# Patient Record
Sex: Male | Born: 1950 | Race: White | Hispanic: No | Marital: Married | Smoking: Former smoker
Health system: Southern US, Community
[De-identification: ages and names within clinical notes are randomized; demographics above are authoritative.]

## PROBLEM LIST (undated history)

## (undated) DIAGNOSIS — Z87898 Personal history of other specified conditions: Secondary | ICD-10-CM

## (undated) DIAGNOSIS — M1712 Unilateral primary osteoarthritis, left knee: Secondary | ICD-10-CM

## (undated) DIAGNOSIS — G894 Chronic pain syndrome: Secondary | ICD-10-CM

## (undated) DIAGNOSIS — J449 Chronic obstructive pulmonary disease, unspecified: Secondary | ICD-10-CM

## (undated) DIAGNOSIS — F17200 Nicotine dependence, unspecified, uncomplicated: Secondary | ICD-10-CM

## (undated) DIAGNOSIS — R569 Unspecified convulsions: Secondary | ICD-10-CM

## (undated) DIAGNOSIS — F411 Generalized anxiety disorder: Secondary | ICD-10-CM

## (undated) DIAGNOSIS — M5136 Other intervertebral disc degeneration, lumbar region: Secondary | ICD-10-CM

## (undated) DIAGNOSIS — K219 Gastro-esophageal reflux disease without esophagitis: Secondary | ICD-10-CM

## (undated) DIAGNOSIS — J439 Emphysema, unspecified: Secondary | ICD-10-CM

## (undated) DIAGNOSIS — G40909 Epilepsy, unspecified, not intractable, without status epilepticus: Secondary | ICD-10-CM

## (undated) DIAGNOSIS — I10 Essential (primary) hypertension: Secondary | ICD-10-CM

## (undated) DIAGNOSIS — Z8701 Personal history of pneumonia (recurrent): Secondary | ICD-10-CM

## (undated) HISTORY — PX: KNEE SURGERY: SHX244

## (undated) HISTORY — DX: Unilateral primary osteoarthritis, left knee: M17.12

## (undated) HISTORY — DX: Chronic obstructive pulmonary disease, unspecified: J44.9

## (undated) HISTORY — DX: Epilepsy, unspecified, not intractable, without status epilepticus: G40.909

## (undated) HISTORY — DX: Nicotine dependence, unspecified, uncomplicated: F17.200

## (undated) HISTORY — DX: Gastro-esophageal reflux disease without esophagitis: K21.9

## (undated) HISTORY — DX: Generalized anxiety disorder: F41.1

## (undated) HISTORY — DX: Essential (primary) hypertension: I10

## (undated) HISTORY — DX: Personal history of other specified conditions: Z87.898

## (undated) HISTORY — DX: Chronic pain syndrome: G89.4

## (undated) HISTORY — DX: Other intervertebral disc degeneration, lumbar region: M51.36

## (undated) HISTORY — DX: Personal history of pneumonia (recurrent): Z87.01

---

## 2004-06-14 ENCOUNTER — Emergency Department (HOSPITAL_COMMUNITY): Admission: EM | Admit: 2004-06-14 | Discharge: 2004-06-14 | Payer: Self-pay | Admitting: Emergency Medicine

## 2004-07-25 ENCOUNTER — Ambulatory Visit (HOSPITAL_COMMUNITY): Admission: RE | Admit: 2004-07-25 | Discharge: 2004-07-25 | Payer: Self-pay | Admitting: Neurology

## 2006-04-25 ENCOUNTER — Ambulatory Visit (HOSPITAL_COMMUNITY): Admission: RE | Admit: 2006-04-25 | Discharge: 2006-04-25 | Payer: Self-pay | Admitting: Family Medicine

## 2006-04-25 ENCOUNTER — Ambulatory Visit: Payer: Self-pay | Admitting: Family Medicine

## 2006-04-25 DIAGNOSIS — J309 Allergic rhinitis, unspecified: Secondary | ICD-10-CM | POA: Insufficient documentation

## 2006-04-25 DIAGNOSIS — Z720 Tobacco use: Secondary | ICD-10-CM | POA: Insufficient documentation

## 2006-04-25 DIAGNOSIS — M545 Low back pain, unspecified: Secondary | ICD-10-CM | POA: Insufficient documentation

## 2006-04-25 DIAGNOSIS — M1990 Unspecified osteoarthritis, unspecified site: Secondary | ICD-10-CM | POA: Insufficient documentation

## 2006-04-25 DIAGNOSIS — F17200 Nicotine dependence, unspecified, uncomplicated: Secondary | ICD-10-CM | POA: Insufficient documentation

## 2006-04-25 DIAGNOSIS — F32A Depression, unspecified: Secondary | ICD-10-CM | POA: Insufficient documentation

## 2006-04-25 DIAGNOSIS — J449 Chronic obstructive pulmonary disease, unspecified: Secondary | ICD-10-CM | POA: Insufficient documentation

## 2006-04-25 DIAGNOSIS — F411 Generalized anxiety disorder: Secondary | ICD-10-CM

## 2006-04-25 DIAGNOSIS — F329 Major depressive disorder, single episode, unspecified: Secondary | ICD-10-CM

## 2006-04-25 DIAGNOSIS — G40309 Generalized idiopathic epilepsy and epileptic syndromes, not intractable, without status epilepticus: Secondary | ICD-10-CM | POA: Insufficient documentation

## 2006-04-26 ENCOUNTER — Encounter (INDEPENDENT_AMBULATORY_CARE_PROVIDER_SITE_OTHER): Payer: Self-pay | Admitting: Family Medicine

## 2006-04-26 LAB — CONVERTED CEMR LAB
ALT: 11 units/L (ref 0–53)
AST: 17 units/L (ref 0–37)
Albumin: 3.9 g/dL (ref 3.5–5.2)
Alkaline Phosphatase: 68 units/L (ref 39–117)
BUN: 5 mg/dL — ABNORMAL LOW (ref 6–23)
Basophils Absolute: 0 10*3/uL (ref 0.0–0.1)
Basophils Relative: 1 % (ref 0–1)
CO2: 24 meq/L (ref 19–32)
Calcium: 9 mg/dL (ref 8.4–10.5)
Chloride: 106 meq/L (ref 96–112)
Creatinine, Ser: 0.75 mg/dL (ref 0.40–1.50)
Eosinophils Absolute: 0.5 10*3/uL (ref 0.0–0.7)
Eosinophils Relative: 9 % — ABNORMAL HIGH (ref 0–5)
Glucose, Bld: 80 mg/dL (ref 70–99)
HCT: 42.6 % (ref 39.0–52.0)
Hemoglobin: 14.2 g/dL (ref 13.0–17.0)
Lymphocytes Relative: 35 % (ref 12–46)
Lymphs Abs: 2 10*3/uL (ref 0.7–3.3)
MCHC: 33.3 g/dL (ref 30.0–36.0)
MCV: 90.3 fL (ref 78.0–100.0)
Monocytes Absolute: 0.6 10*3/uL (ref 0.2–0.7)
Monocytes Relative: 10 % (ref 3–11)
Neutro Abs: 2.6 10*3/uL (ref 1.7–7.7)
Neutrophils Relative %: 45 % (ref 43–77)
Phenytoin Lvl: 7.8 ug/mL — ABNORMAL LOW (ref 10.0–20.0)
Platelets: 177 10*3/uL (ref 150–400)
Potassium: 4.6 meq/L (ref 3.5–5.3)
RBC: 4.72 M/uL (ref 4.22–5.81)
RDW: 14 % (ref 11.5–14.0)
Sodium: 141 meq/L (ref 135–145)
TSH: 2.184 microintl units/mL (ref 0.350–5.50)
Total Bilirubin: 0.3 mg/dL (ref 0.3–1.2)
Total Protein: 6.6 g/dL (ref 6.0–8.3)
WBC: 5.7 10*3/uL (ref 4.0–10.5)

## 2006-05-20 ENCOUNTER — Ambulatory Visit: Payer: Self-pay | Admitting: Family Medicine

## 2006-05-21 ENCOUNTER — Encounter (INDEPENDENT_AMBULATORY_CARE_PROVIDER_SITE_OTHER): Payer: Self-pay | Admitting: Family Medicine

## 2006-05-23 LAB — CONVERTED CEMR LAB: Phenytoin Lvl: 9.3 ug/mL — ABNORMAL LOW (ref 10.0–20.0)

## 2006-05-26 ENCOUNTER — Encounter (INDEPENDENT_AMBULATORY_CARE_PROVIDER_SITE_OTHER): Payer: Self-pay | Admitting: Family Medicine

## 2006-05-27 ENCOUNTER — Ambulatory Visit (HOSPITAL_COMMUNITY): Admission: RE | Admit: 2006-05-27 | Discharge: 2006-05-27 | Payer: Self-pay | Admitting: Family Medicine

## 2006-06-10 ENCOUNTER — Encounter (INDEPENDENT_AMBULATORY_CARE_PROVIDER_SITE_OTHER): Payer: Self-pay | Admitting: Family Medicine

## 2006-06-17 ENCOUNTER — Encounter (INDEPENDENT_AMBULATORY_CARE_PROVIDER_SITE_OTHER): Payer: Self-pay | Admitting: Family Medicine

## 2006-06-17 ENCOUNTER — Telehealth (INDEPENDENT_AMBULATORY_CARE_PROVIDER_SITE_OTHER): Payer: Self-pay | Admitting: Family Medicine

## 2006-07-01 ENCOUNTER — Ambulatory Visit: Payer: Self-pay | Admitting: Family Medicine

## 2006-07-01 DIAGNOSIS — M5030 Other cervical disc degeneration, unspecified cervical region: Secondary | ICD-10-CM

## 2006-07-14 ENCOUNTER — Telehealth (INDEPENDENT_AMBULATORY_CARE_PROVIDER_SITE_OTHER): Payer: Self-pay | Admitting: Family Medicine

## 2006-09-01 ENCOUNTER — Ambulatory Visit: Payer: Self-pay | Admitting: Family Medicine

## 2006-09-01 DIAGNOSIS — M5126 Other intervertebral disc displacement, lumbar region: Secondary | ICD-10-CM | POA: Insufficient documentation

## 2006-09-14 ENCOUNTER — Telehealth (INDEPENDENT_AMBULATORY_CARE_PROVIDER_SITE_OTHER): Payer: Self-pay | Admitting: Family Medicine

## 2006-09-19 ENCOUNTER — Encounter (INDEPENDENT_AMBULATORY_CARE_PROVIDER_SITE_OTHER): Payer: Self-pay | Admitting: Family Medicine

## 2006-11-08 ENCOUNTER — Encounter (INDEPENDENT_AMBULATORY_CARE_PROVIDER_SITE_OTHER): Payer: Self-pay | Admitting: Family Medicine

## 2007-04-13 ENCOUNTER — Encounter (INDEPENDENT_AMBULATORY_CARE_PROVIDER_SITE_OTHER): Payer: Self-pay | Admitting: Family Medicine

## 2007-06-29 ENCOUNTER — Encounter (INDEPENDENT_AMBULATORY_CARE_PROVIDER_SITE_OTHER): Payer: Self-pay | Admitting: Family Medicine

## 2007-08-02 ENCOUNTER — Ambulatory Visit: Payer: Self-pay | Admitting: Family Medicine

## 2007-08-02 LAB — CONVERTED CEMR LAB
Cholesterol, target level: 200 mg/dL
HDL goal, serum: 40 mg/dL
LDL Goal: 130 mg/dL

## 2007-08-16 ENCOUNTER — Telehealth (INDEPENDENT_AMBULATORY_CARE_PROVIDER_SITE_OTHER): Payer: Self-pay | Admitting: Family Medicine

## 2007-09-15 ENCOUNTER — Encounter (INDEPENDENT_AMBULATORY_CARE_PROVIDER_SITE_OTHER): Payer: Self-pay | Admitting: Family Medicine

## 2007-09-18 LAB — CONVERTED CEMR LAB
ALT: 35 units/L (ref 0–53)
AST: 26 units/L (ref 0–37)
Albumin: 4.2 g/dL (ref 3.5–5.2)
Alkaline Phosphatase: 68 units/L (ref 39–117)
BUN: 13 mg/dL (ref 6–23)
Basophils Absolute: 0 10*3/uL (ref 0.0–0.1)
Basophils Relative: 0 % (ref 0–1)
CO2: 25 meq/L (ref 19–32)
Calcium: 9.2 mg/dL (ref 8.4–10.5)
Chloride: 105 meq/L (ref 96–112)
Cholesterol: 247 mg/dL — ABNORMAL HIGH (ref 0–200)
Creatinine, Ser: 0.72 mg/dL (ref 0.40–1.50)
Eosinophils Absolute: 0.6 10*3/uL (ref 0.0–0.7)
Eosinophils Relative: 9 % — ABNORMAL HIGH (ref 0–5)
Glucose, Bld: 98 mg/dL (ref 70–99)
HCT: 44.6 % (ref 39.0–52.0)
HDL: 68 mg/dL (ref >39)
Hemoglobin: 14.9 g/dL (ref 13.0–17.0)
LDL Cholesterol: 152 mg/dL — ABNORMAL HIGH (ref 0–99)
Lymphocytes Relative: 30 % (ref 12–46)
Lymphs Abs: 1.8 10*3/uL (ref 0.7–4.0)
MCHC: 33.4 g/dL (ref 30.0–36.0)
MCV: 95.5 fL (ref 78.0–100.0)
Monocytes Absolute: 0.5 10*3/uL (ref 0.1–1.0)
Monocytes Relative: 9 % (ref 3–12)
Neutro Abs: 3.1 10*3/uL (ref 1.7–7.7)
Neutrophils Relative %: 51 % (ref 43–77)
PSA: 0.6 ng/mL (ref 0.10–4.00)
Phenytoin Lvl: 3.7 ug/mL — ABNORMAL LOW (ref 10.0–20.0)
Platelets: 159 10*3/uL (ref 150–400)
Potassium: 4.9 meq/L (ref 3.5–5.3)
RBC: 4.67 M/uL (ref 4.22–5.81)
RDW: 14.2 % (ref 11.5–15.5)
Sodium: 140 meq/L (ref 135–145)
TSH: 2.213 microintl units/mL (ref 0.350–4.50)
Total Bilirubin: 0.4 mg/dL (ref 0.3–1.2)
Total CHOL/HDL Ratio: 3.6
Total Protein: 7 g/dL (ref 6.0–8.3)
Triglycerides: 136 mg/dL (ref <150)
VLDL: 27 mg/dL (ref 0–40)
WBC: 6 10*3/uL (ref 4.0–10.5)

## 2007-09-21 ENCOUNTER — Ambulatory Visit: Payer: Self-pay | Admitting: Family Medicine

## 2007-09-21 LAB — CONVERTED CEMR LAB: LDL Goal: 160 mg/dL

## 2007-10-31 ENCOUNTER — Ambulatory Visit: Payer: Self-pay | Admitting: Family Medicine

## 2007-10-31 DIAGNOSIS — M25569 Pain in unspecified knee: Secondary | ICD-10-CM

## 2007-11-03 ENCOUNTER — Encounter (INDEPENDENT_AMBULATORY_CARE_PROVIDER_SITE_OTHER): Payer: Self-pay | Admitting: Family Medicine

## 2007-11-03 ENCOUNTER — Ambulatory Visit (HOSPITAL_COMMUNITY): Admission: RE | Admit: 2007-11-03 | Discharge: 2007-11-03 | Payer: Self-pay | Admitting: Family Medicine

## 2007-11-07 ENCOUNTER — Ambulatory Visit: Payer: Self-pay | Admitting: Family Medicine

## 2007-11-13 ENCOUNTER — Encounter (INDEPENDENT_AMBULATORY_CARE_PROVIDER_SITE_OTHER): Payer: Self-pay | Admitting: Family Medicine

## 2007-12-04 ENCOUNTER — Encounter (INDEPENDENT_AMBULATORY_CARE_PROVIDER_SITE_OTHER): Payer: Self-pay | Admitting: Family Medicine

## 2007-12-15 ENCOUNTER — Ambulatory Visit: Payer: Self-pay | Admitting: Family Medicine

## 2007-12-18 ENCOUNTER — Ambulatory Visit (HOSPITAL_COMMUNITY): Admission: RE | Admit: 2007-12-18 | Discharge: 2007-12-18 | Payer: Self-pay | Admitting: Pulmonary Disease

## 2007-12-22 ENCOUNTER — Encounter (INDEPENDENT_AMBULATORY_CARE_PROVIDER_SITE_OTHER): Payer: Self-pay | Admitting: Family Medicine

## 2008-01-05 ENCOUNTER — Ambulatory Visit: Payer: Self-pay | Admitting: Family Medicine

## 2008-01-17 ENCOUNTER — Telehealth (INDEPENDENT_AMBULATORY_CARE_PROVIDER_SITE_OTHER): Payer: Self-pay | Admitting: Family Medicine

## 2008-01-29 ENCOUNTER — Telehealth (INDEPENDENT_AMBULATORY_CARE_PROVIDER_SITE_OTHER): Payer: Self-pay | Admitting: Family Medicine

## 2008-01-31 ENCOUNTER — Ambulatory Visit (HOSPITAL_COMMUNITY): Admission: RE | Admit: 2008-01-31 | Discharge: 2008-01-31 | Payer: Self-pay | Admitting: Family Medicine

## 2008-01-31 ENCOUNTER — Ambulatory Visit: Payer: Self-pay | Admitting: Family Medicine

## 2008-01-31 DIAGNOSIS — M5412 Radiculopathy, cervical region: Secondary | ICD-10-CM | POA: Insufficient documentation

## 2008-02-16 HISTORY — PX: COLONOSCOPY: SHX174

## 2008-02-19 ENCOUNTER — Telehealth (INDEPENDENT_AMBULATORY_CARE_PROVIDER_SITE_OTHER): Payer: Self-pay | Admitting: Family Medicine

## 2008-02-20 ENCOUNTER — Encounter (INDEPENDENT_AMBULATORY_CARE_PROVIDER_SITE_OTHER): Payer: Self-pay | Admitting: Family Medicine

## 2008-02-21 ENCOUNTER — Ambulatory Visit: Payer: Self-pay | Admitting: Family Medicine

## 2008-02-28 ENCOUNTER — Ambulatory Visit: Payer: Self-pay | Admitting: Family Medicine

## 2008-03-13 ENCOUNTER — Encounter (INDEPENDENT_AMBULATORY_CARE_PROVIDER_SITE_OTHER): Payer: Self-pay | Admitting: Family Medicine

## 2008-03-19 LAB — HM COLONOSCOPY: HM Colonoscopy: NORMAL

## 2008-03-26 ENCOUNTER — Encounter (INDEPENDENT_AMBULATORY_CARE_PROVIDER_SITE_OTHER): Payer: Self-pay | Admitting: Family Medicine

## 2008-04-15 ENCOUNTER — Ambulatory Visit: Payer: Self-pay | Admitting: Family Medicine

## 2008-05-17 ENCOUNTER — Ambulatory Visit: Payer: Self-pay | Admitting: Family Medicine

## 2008-05-17 DIAGNOSIS — E782 Mixed hyperlipidemia: Secondary | ICD-10-CM

## 2008-05-17 LAB — CONVERTED CEMR LAB: LDL Goal: 130 mg/dL

## 2008-07-19 ENCOUNTER — Ambulatory Visit: Payer: Self-pay | Admitting: Family Medicine

## 2008-09-06 ENCOUNTER — Ambulatory Visit: Payer: Self-pay | Admitting: Family Medicine

## 2008-09-09 LAB — CONVERTED CEMR LAB
ALT: 24 units/L (ref 0–53)
AST: 21 units/L (ref 0–37)
Albumin: 3.8 g/dL (ref 3.5–5.2)
Alkaline Phosphatase: 66 units/L (ref 39–117)
BUN: 8 mg/dL (ref 6–23)
Basophils Absolute: 0.1 10*3/uL (ref 0.0–0.1)
Basophils Relative: 1 % (ref 0–1)
CO2: 22 meq/L (ref 19–32)
Calcium: 8.8 mg/dL (ref 8.4–10.5)
Chloride: 104 meq/L (ref 96–112)
Cholesterol: 217 mg/dL — ABNORMAL HIGH (ref 0–200)
Creatinine, Ser: 0.77 mg/dL (ref 0.40–1.50)
Eosinophils Absolute: 0.6 10*3/uL (ref 0.0–0.7)
Eosinophils Relative: 8 % — ABNORMAL HIGH (ref 0–5)
Glucose, Bld: 90 mg/dL (ref 70–99)
HCT: 41.4 % (ref 39.0–52.0)
HDL: 75 mg/dL (ref >39)
Hemoglobin: 14 g/dL (ref 13.0–17.0)
LDL Cholesterol: 122 mg/dL — ABNORMAL HIGH (ref 0–99)
Lymphocytes Relative: 29 % (ref 12–46)
Lymphs Abs: 2.2 10*3/uL (ref 0.7–4.0)
MCHC: 33.8 g/dL (ref 30.0–36.0)
MCV: 94.1 fL (ref 78.0–100.0)
Monocytes Absolute: 0.6 10*3/uL (ref 0.1–1.0)
Monocytes Relative: 8 % (ref 3–12)
Neutro Abs: 4 10*3/uL (ref 1.7–7.7)
Neutrophils Relative %: 54 % (ref 43–77)
PSA: 0.9 ng/mL (ref 0.10–4.00)
Phenytoin Lvl: 5.3 ug/mL — ABNORMAL LOW (ref 10.0–20.0)
Platelets: 204 10*3/uL (ref 150–400)
Potassium: 4.3 meq/L (ref 3.5–5.3)
RBC: 4.4 M/uL (ref 4.22–5.81)
RDW: 13.3 % (ref 11.5–15.5)
Sodium: 140 meq/L (ref 135–145)
TSH: 2.94 microintl units/mL (ref 0.350–4.500)
Total Bilirubin: 0.3 mg/dL (ref 0.3–1.2)
Total CHOL/HDL Ratio: 2.9
Total Protein: 6.4 g/dL (ref 6.0–8.3)
Triglycerides: 101 mg/dL (ref <150)
VLDL: 20 mg/dL (ref 0–40)
WBC: 7.4 10*3/uL (ref 4.0–10.5)

## 2008-09-18 ENCOUNTER — Encounter (INDEPENDENT_AMBULATORY_CARE_PROVIDER_SITE_OTHER): Payer: Self-pay | Admitting: Family Medicine

## 2008-10-01 ENCOUNTER — Encounter (INDEPENDENT_AMBULATORY_CARE_PROVIDER_SITE_OTHER): Payer: Self-pay | Admitting: Family Medicine

## 2008-10-04 ENCOUNTER — Encounter (INDEPENDENT_AMBULATORY_CARE_PROVIDER_SITE_OTHER): Payer: Self-pay | Admitting: Family Medicine

## 2008-11-15 DIAGNOSIS — M5136 Other intervertebral disc degeneration, lumbar region: Secondary | ICD-10-CM

## 2008-11-15 DIAGNOSIS — M51369 Other intervertebral disc degeneration, lumbar region without mention of lumbar back pain or lower extremity pain: Secondary | ICD-10-CM

## 2008-11-15 HISTORY — DX: Other intervertebral disc degeneration, lumbar region: M51.36

## 2008-11-15 HISTORY — DX: Other intervertebral disc degeneration, lumbar region without mention of lumbar back pain or lower extremity pain: M51.369

## 2008-12-13 ENCOUNTER — Ambulatory Visit (HOSPITAL_COMMUNITY): Admission: RE | Admit: 2008-12-13 | Discharge: 2008-12-13 | Payer: Self-pay | Admitting: Pediatrics

## 2008-12-25 ENCOUNTER — Encounter
Admission: RE | Admit: 2008-12-25 | Discharge: 2009-02-05 | Payer: Self-pay | Admitting: Physical Medicine & Rehabilitation

## 2008-12-26 ENCOUNTER — Ambulatory Visit: Payer: Self-pay | Admitting: Physical Medicine & Rehabilitation

## 2009-02-27 ENCOUNTER — Encounter
Admission: RE | Admit: 2009-02-27 | Discharge: 2009-05-28 | Payer: Self-pay | Admitting: Physical Medicine & Rehabilitation

## 2009-02-28 ENCOUNTER — Ambulatory Visit: Payer: Self-pay | Admitting: Physical Medicine & Rehabilitation

## 2009-06-17 ENCOUNTER — Encounter
Admission: RE | Admit: 2009-06-17 | Discharge: 2009-06-17 | Payer: Self-pay | Source: Home / Self Care | Admitting: Physical Medicine & Rehabilitation

## 2010-03-04 ENCOUNTER — Ambulatory Visit: Admit: 2010-03-04 | Payer: Self-pay | Admitting: Internal Medicine

## 2010-03-04 ENCOUNTER — Ambulatory Visit (HOSPITAL_COMMUNITY)
Admission: RE | Admit: 2010-03-04 | Discharge: 2010-03-04 | Payer: Self-pay | Source: Home / Self Care | Attending: Internal Medicine | Admitting: Internal Medicine

## 2010-03-20 NOTE — Op Note (Signed)
  NAME:  Elijah Mcfarland, Elijah Mcfarland              ACCOUNT NO.:  [id number]  MEDICAL RECORD NO.:  [id number]          PATIENT TYPE:  AMB  LOCATION:  DAY                           FACILITY:  [facility]  PHYSICIAN:  Lionel December, M.D.    DATE OF BIRTH:  03-04-1950  DATE OF PROCEDURE:  03/04/2010 DATE OF DISCHARGE:                              OPERATIVE REPORT   PROCEDURE:  Colonoscopy with saline-assisted polypectomy.  INDICATION:  Elijah Mcfarland is a 60 year old Caucasian male who is here for average risk screening colonoscopy.  He has intermittent hematochezia, felt to be secondary hemorrhoids.  Procedure risks were reviewed with the patient.  Informed consent was obtained.  MEDICATIONS FOR CONSCIOUS SEDATION: 1. Demerol 50 mg IV. 2. Versed 6 mg IV.  FINDINGS:  Procedure performed in endoscopy suite.  The patient's vital signs and O2 sat were monitored during the procedure and remained stable.  The patient was placed in the left lateral recumbent position. Rectal examination was performed.  Soft and firm lesion was palpated on digital exam.  Pentax videoscope was placed through rectum and advanced under vision into sigmoid colon and beyond.  Preparation was satisfactory.  Scope was passed into cecum which was identified by appendiceal orifice and ileocecal valve.  Pictures were taken for the record.  As the scope was withdrawn and colonic mucosa was carefully examined, there was a 6 x 8 mm sessile polyp at proximal transverse colon which was elevated with saline and the polypectomy performed. Polypectomy was completed, this polyp was retrieved for sludge examination.  Mucosa and rest of the colon was normal.  Rectal mucosa similarly was normal.  Scope was retroflexed to examine anorectal junction.  He had some focal erythema above the dentate line and pale, somewhat pedunculated lesion fissuring from the anoderm possibly a large anal papilla.  There was a smaller ranges next to it.  The larger lesion was  biopsied for histologic confirmation to make sure it was not a malignant process.  Endoscope was then withdrawn.  Withdrawal time was 20 minutes.  The patient tolerated the procedure well.  FINAL DIAGNOSES: 1. Examination performed to cecum. 2. 6 x 8 mm sessile polyp snared from distal transverse colon with     saline cyst. 3. Large mobile lesion originating from anoderm possibly anal papilla     along with a smaller lesion.  Biopsy was obtained from the larger     lesion for histologic confirmation. 4. Focal proctitis above the dentate line.  RECOMMENDATIONS: 1. High-fiber diet. 2. Anusol-HC suppository per rectum at bedtime for 2 weeks. 3. No aspirin for 10 days.  I will be contacting the patient with     results of biopsy and further recommendations.     Lionel December, M.D.     NR/MEDQ  D:  03/04/2010  T:  03/04/2010  Job:  [id number]  cc:   Dr. Acey Lav  Electronically Signed by Lionel December M.D. on 03/20/2010 12:56:42 PM

## 2010-06-13 ENCOUNTER — Emergency Department (HOSPITAL_COMMUNITY)
Admission: EM | Admit: 2010-06-13 | Discharge: 2010-06-13 | Disposition: A | Discharge status: Home / Self Care | Payer: BC Managed Care – PPO | Attending: Emergency Medicine | Admitting: Emergency Medicine

## 2010-06-13 ENCOUNTER — Emergency Department (HOSPITAL_COMMUNITY): Payer: BC Managed Care – PPO

## 2010-06-13 DIAGNOSIS — G40802 Other epilepsy, not intractable, without status epilepticus: Secondary | ICD-10-CM | POA: Insufficient documentation

## 2010-06-13 DIAGNOSIS — Z79899 Other long term (current) drug therapy: Secondary | ICD-10-CM | POA: Insufficient documentation

## 2010-06-13 DIAGNOSIS — J4489 Other specified chronic obstructive pulmonary disease: Secondary | ICD-10-CM | POA: Insufficient documentation

## 2010-06-13 DIAGNOSIS — Z96659 Presence of unspecified artificial knee joint: Secondary | ICD-10-CM | POA: Insufficient documentation

## 2010-06-13 DIAGNOSIS — J189 Pneumonia, unspecified organism: Secondary | ICD-10-CM | POA: Insufficient documentation

## 2010-06-13 DIAGNOSIS — J449 Chronic obstructive pulmonary disease, unspecified: Secondary | ICD-10-CM | POA: Insufficient documentation

## 2010-06-16 DIAGNOSIS — Z8701 Personal history of pneumonia (recurrent): Secondary | ICD-10-CM

## 2010-06-16 HISTORY — DX: Personal history of pneumonia (recurrent): Z87.01

## 2010-06-29 ENCOUNTER — Other Ambulatory Visit (HOSPITAL_COMMUNITY): Payer: Self-pay | Admitting: Pediatrics

## 2010-06-29 DIAGNOSIS — J189 Pneumonia, unspecified organism: Secondary | ICD-10-CM

## 2010-06-29 DIAGNOSIS — R0789 Other chest pain: Secondary | ICD-10-CM

## 2010-06-29 DIAGNOSIS — R0602 Shortness of breath: Secondary | ICD-10-CM

## 2010-06-30 ENCOUNTER — Other Ambulatory Visit (HOSPITAL_COMMUNITY): Payer: Self-pay | Admitting: Pediatrics

## 2010-06-30 ENCOUNTER — Ambulatory Visit (HOSPITAL_COMMUNITY)
Admission: RE | Admit: 2010-06-30 | Discharge: 2010-06-30 | Disposition: A | Discharge status: Home / Self Care | Payer: BC Managed Care – PPO | Source: Ambulatory Visit | Attending: Pediatrics | Admitting: Pediatrics

## 2010-06-30 ENCOUNTER — Encounter (HOSPITAL_COMMUNITY): Payer: Self-pay

## 2010-06-30 DIAGNOSIS — J4489 Other specified chronic obstructive pulmonary disease: Secondary | ICD-10-CM | POA: Insufficient documentation

## 2010-06-30 DIAGNOSIS — J189 Pneumonia, unspecified organism: Secondary | ICD-10-CM

## 2010-06-30 DIAGNOSIS — J449 Chronic obstructive pulmonary disease, unspecified: Secondary | ICD-10-CM | POA: Insufficient documentation

## 2010-06-30 DIAGNOSIS — R0602 Shortness of breath: Secondary | ICD-10-CM

## 2010-06-30 DIAGNOSIS — R0789 Other chest pain: Secondary | ICD-10-CM

## 2010-06-30 DIAGNOSIS — J984 Other disorders of lung: Secondary | ICD-10-CM | POA: Insufficient documentation

## 2010-06-30 MED ORDER — IOHEXOL 350 MG/ML SOLN
100.0000 mL | Freq: Once | INTRAVENOUS | Status: AC | PRN
  Administered 2010-06-30: 100 mL via INTRAVENOUS

## 2010-07-03 NOTE — Procedures (Signed)
NAME:  Elijah Mcfarland, Elijah Mcfarland NO.:  [id number]   MEDICAL RECORD NO.:  [id number]           PATIENT TYPE:  OUT   LOCATION:                                FACILITY:  [facility]   PHYSICIAN:  Edward L. Juanetta Gosling, M.D.DATE OF BIRTH:  22-Jan-1951   DATE OF PROCEDURE:  06/02/2006  DATE OF DISCHARGE:                            PULMONARY FUNCTION TEST   Patient of Dr. Erby Pian   IMPRESSION:  1. Spirometry shows a moderate ventilatory defect with evidence of      airflow obstruction.  2. Lung volumes show no evidence of restrictive pulmonary disease, and      do show mild air trapping.  3. DLCO is normal.  4. Arterial blood gases are normal.  5. There is no significant bronchodilator effect.  6. The clinical diagnosis was restrictive lung disease; and this shows      no evidence of restrictive disease, but instead a rather marked      airflow obstruction.      Edward L. Juanetta Gosling, M.D.  Electronically Signed     ELH/MEDQ  D:  06/02/2006  T:  06/03/2006  Job:  [id number]   cc:   Franchot Heidelberg, M.D.

## 2010-07-10 ENCOUNTER — Other Ambulatory Visit (HOSPITAL_COMMUNITY): Payer: Self-pay | Admitting: Oncology

## 2010-07-10 ENCOUNTER — Encounter (HOSPITAL_COMMUNITY): Payer: BC Managed Care – PPO | Attending: Oncology | Admitting: Oncology

## 2010-07-10 DIAGNOSIS — C3490 Malignant neoplasm of unspecified part of unspecified bronchus or lung: Secondary | ICD-10-CM | POA: Insufficient documentation

## 2010-07-10 DIAGNOSIS — R079 Chest pain, unspecified: Secondary | ICD-10-CM | POA: Insufficient documentation

## 2010-07-10 DIAGNOSIS — R222 Localized swelling, mass and lump, trunk: Secondary | ICD-10-CM

## 2010-07-10 DIAGNOSIS — Z79899 Other long term (current) drug therapy: Secondary | ICD-10-CM | POA: Insufficient documentation

## 2010-07-10 LAB — DIFFERENTIAL
Eosinophils Absolute: 0.2 10*3/uL (ref 0.0–0.7)
Eosinophils Relative: 2 % (ref 0–5)
Lymphs Abs: 3.2 10*3/uL (ref 0.7–4.0)
Monocytes Absolute: 0.5 10*3/uL (ref 0.1–1.0)

## 2010-07-10 LAB — CBC
MCH: 31.3 pg (ref 26.0–34.0)
MCHC: 33.3 g/dL (ref 30.0–36.0)
MCV: 94.2 fL (ref 78.0–100.0)
Platelets: 182 10*3/uL (ref 150–400)
RDW: 14 % (ref 11.5–15.5)
WBC: 10.5 10*3/uL (ref 4.0–10.5)

## 2010-07-10 LAB — APTT: aPTT: 32 seconds (ref 24–37)

## 2010-07-10 LAB — COMPREHENSIVE METABOLIC PANEL
ALT: 52 U/L (ref 0–53)
Albumin: 3.7 g/dL (ref 3.5–5.2)
Calcium: 10.1 mg/dL (ref 8.4–10.5)
GFR calc Af Amer: >60 mL/min (ref >60)
Glucose, Bld: 86 mg/dL (ref 70–99)
Sodium: 135 mEq/L (ref 135–145)
Total Protein: 7.3 g/dL (ref 6.0–8.3)

## 2010-07-11 LAB — CEA: CEA: 4.6 ng/mL (ref 0.0–5.0)

## 2010-07-15 ENCOUNTER — Ambulatory Visit (HOSPITAL_COMMUNITY)
Admission: RE | Admit: 2010-07-15 | Discharge: 2010-07-15 | Disposition: A | Discharge status: Home / Self Care | Payer: BC Managed Care – PPO | Source: Ambulatory Visit | Attending: Oncology | Admitting: Oncology

## 2010-07-15 DIAGNOSIS — C3490 Malignant neoplasm of unspecified part of unspecified bronchus or lung: Secondary | ICD-10-CM | POA: Insufficient documentation

## 2010-07-15 MED ORDER — GADOBENATE DIMEGLUMINE 529 MG/ML IV SOLN: 15.0000 mL | Freq: Once | INTRAVENOUS | Status: AC | PRN

## 2010-07-17 ENCOUNTER — Encounter (HOSPITAL_COMMUNITY)
Admission: RE | Admit: 2010-07-17 | Discharge: 2010-07-17 | Disposition: A | Discharge status: Home / Self Care | Payer: BC Managed Care – PPO | Source: Ambulatory Visit | Attending: Oncology | Admitting: Oncology

## 2010-07-17 DIAGNOSIS — Z87898 Personal history of other specified conditions: Secondary | ICD-10-CM

## 2010-07-17 DIAGNOSIS — J984 Other disorders of lung: Secondary | ICD-10-CM | POA: Insufficient documentation

## 2010-07-17 DIAGNOSIS — Z79899 Other long term (current) drug therapy: Secondary | ICD-10-CM | POA: Insufficient documentation

## 2010-07-17 DIAGNOSIS — Z7982 Long term (current) use of aspirin: Secondary | ICD-10-CM | POA: Insufficient documentation

## 2010-07-17 DIAGNOSIS — C3490 Malignant neoplasm of unspecified part of unspecified bronchus or lung: Secondary | ICD-10-CM | POA: Insufficient documentation

## 2010-07-17 HISTORY — DX: Personal history of other specified conditions: Z87.898

## 2010-07-17 MED ORDER — FLUDEOXYGLUCOSE F - 18 (FDG) INJECTION
17.5000 | Freq: Once | INTRAVENOUS | Status: AC | PRN
  Administered 2010-07-17: 17.5 via INTRAVENOUS

## 2010-07-21 ENCOUNTER — Other Ambulatory Visit: Payer: Self-pay | Admitting: Thoracic Surgery (Cardiothoracic Vascular Surgery)

## 2010-07-21 ENCOUNTER — Encounter (INDEPENDENT_AMBULATORY_CARE_PROVIDER_SITE_OTHER): Payer: BC Managed Care – PPO | Admitting: Thoracic Surgery (Cardiothoracic Vascular Surgery)

## 2010-07-21 DIAGNOSIS — R918 Other nonspecific abnormal finding of lung field: Secondary | ICD-10-CM

## 2010-07-21 DIAGNOSIS — D381 Neoplasm of uncertain behavior of trachea, bronchus and lung: Secondary | ICD-10-CM

## 2010-07-22 NOTE — Consult Note (Signed)
NEW PATIENT CONSULTATION  MITCH, ARQUETTE DOB:  01-01-1951                                        July 21, 2010 CHART #:  [id number]  REASON FOR CONSULTATION:  Lung nodules.  HISTORY OF PRESENT ILLNESS:  The patient is a 60 year old gentleman who has a history of heavy tobacco abuse.  He smokes about three packs of cigarettes a day.  He also has a history of COPD.  He recently had an episode of chest pain.  He had been having some difficulty with breathing and had frequent cough.  He was admitted to [facility], apparently ruled out for MI, was found to have pneumonia and some broken ribs from coughing.  This was about 6 weeks ago.  He was treated initially, felt a little better, and then about 3 weeks ago began having shortness of breath again.  This led to a CT of the chest which showed no evidence of pulmonary embolism.  There were bibasilar effusions and atelectasis.  There were two left upper lobe nodules one 13 x 9 x 17 and one 9 x 5 x 9 mm.  These were felt to probably be inflammatory in nature but malignancy was not excluded.  He was referred to Dr. Glenford Peers. An MRI of the brain was done and showed no evidence of metastatic disease.  There was some diffuse small vessel changes.  There was no evidence of a stroke.  He then was scheduled for a PET scan which was done on Friday.  It showed that the nodular densities in the left upper lobe had decreased in intensity and not necessarily incised.  The remaining areas of consolidated lung had decreased in both size and density.  There was no hypermetabolic activity on the left.  There was some mild hypermetabolic activity on the right side associated with diffuse consolidative process.  There was no evidence of malignancy.  Of note, he also had a CEA level which was within normal limits.  PAST MEDICAL HISTORY:  Significant for tobacco abuse, COPD, and history of seizures.  CURRENT MEDICATIONS: 1.  Oxycodone 5/325 two tablets every 4-6 hours as needed for pain. 2. Alprazolam 1 mg p.r.n. for anxiety. 3. Dilantin 300 mg daily. 4. Aspirin 81 mg daily. 5. Advair Diskus 500/50 two puffs daily. 6. Ventolin p.r.n. 7. Bupropion 150 mg b.i.d.  ALLERGIES:  He has no known drug allergies.  FAMILY HISTORY:  Significant for cardiac disease in his mother.  No history of lung cancer.  SOCIAL HISTORY:  He is married.  He works as an Personnel officer.  He smokes three packs a day.  He does drink alcohol socially.  REVIEW OF SYSTEMS:  Recent cough, wheezing, chest pain which is pleuritic in nature.  No hemoptysis.  No recent seizures, none since 2006.  Denies chest pain or tightness.  All other systems are negative.  LABORATORY DATA:  CT scan, MRI of the brain, and PET-CT reviewed, findings as previously noted.  PT is 12.9, PTT 32.  CEA 4.6.  Sodium 135, potassium 4.2, BUN 15, creatinine 0.58.  LFTs within normal limits. White count 10.3, hematocrit 40, platelets 182.  EKG shows sinus rhythm.  IMPRESSION:  The patient is a 60 year old gentleman who recently had pneumonia.  CT of the chest suggested some possibility of lung nodules; however, on repeat CT 2 weeks later in  association with a PET scan these nodules were far less dense and appeared to be resolving as did the other changes within the chest.  There was no evidence of a malignancy. I did recommend to the patient that we repeat another CT scan in about 6 weeks.  We can do that without contrast just to make sure that the remaining residual changes resolve.  I will see him back in the office at that time.  Salvatore Decent Dorris Fetch, M.D. Electronically Signed  SCH/MEDQ  D:  07/21/2010  T:  07/22/2010  Job:  [id number]  cc:   Ladona Horns. Mariel Sleet, MD Francoise Schaumann. Halm, DO, FAAP

## 2010-07-27 ENCOUNTER — Ambulatory Visit (HOSPITAL_COMMUNITY): Payer: BC Managed Care – PPO | Admitting: Oncology

## 2010-07-28 ENCOUNTER — Encounter (HOSPITAL_COMMUNITY): Payer: BC Managed Care – PPO | Attending: Oncology | Admitting: Oncology

## 2010-07-28 DIAGNOSIS — R079 Chest pain, unspecified: Secondary | ICD-10-CM | POA: Insufficient documentation

## 2010-07-28 DIAGNOSIS — R222 Localized swelling, mass and lump, trunk: Secondary | ICD-10-CM

## 2010-07-28 DIAGNOSIS — Z79899 Other long term (current) drug therapy: Secondary | ICD-10-CM | POA: Insufficient documentation

## 2010-07-28 DIAGNOSIS — C3490 Malignant neoplasm of unspecified part of unspecified bronchus or lung: Secondary | ICD-10-CM | POA: Insufficient documentation

## 2010-09-02 ENCOUNTER — Ambulatory Visit
Admission: RE | Admit: 2010-09-02 | Discharge: 2010-09-02 | Disposition: A | Discharge status: Home / Self Care | Payer: BC Managed Care – PPO | Source: Ambulatory Visit | Attending: Thoracic Surgery (Cardiothoracic Vascular Surgery) | Admitting: Thoracic Surgery (Cardiothoracic Vascular Surgery)

## 2010-09-02 ENCOUNTER — Encounter: Payer: BC Managed Care – PPO | Admitting: Thoracic Surgery (Cardiothoracic Vascular Surgery)

## 2010-09-02 DIAGNOSIS — R918 Other nonspecific abnormal finding of lung field: Secondary | ICD-10-CM

## 2010-09-03 ENCOUNTER — Encounter: Payer: BC Managed Care – PPO | Admitting: Thoracic Surgery (Cardiothoracic Vascular Surgery)

## 2010-09-08 ENCOUNTER — Ambulatory Visit (HOSPITAL_COMMUNITY): Payer: BC Managed Care – PPO | Admitting: Oncology

## 2010-09-28 ENCOUNTER — Encounter: Payer: BC Managed Care – PPO | Admitting: Thoracic Surgery (Cardiothoracic Vascular Surgery)

## 2010-10-02 ENCOUNTER — Ambulatory Visit (HOSPITAL_COMMUNITY): Payer: BC Managed Care – PPO | Admitting: Oncology

## 2011-03-15 ENCOUNTER — Other Ambulatory Visit: Payer: Self-pay | Admitting: Thoracic Surgery (Cardiothoracic Vascular Surgery)

## 2011-03-15 DIAGNOSIS — D381 Neoplasm of uncertain behavior of trachea, bronchus and lung: Secondary | ICD-10-CM

## 2011-04-06 ENCOUNTER — Encounter: Payer: Self-pay | Admitting: Thoracic Surgery (Cardiothoracic Vascular Surgery)

## 2011-04-06 ENCOUNTER — Ambulatory Visit
Admission: RE | Admit: 2011-04-06 | Discharge: 2011-04-06 | Disposition: A | Discharge status: Home / Self Care | Payer: 59 | Source: Ambulatory Visit | Attending: Thoracic Surgery (Cardiothoracic Vascular Surgery) | Admitting: Thoracic Surgery (Cardiothoracic Vascular Surgery)

## 2011-04-06 ENCOUNTER — Ambulatory Visit (INDEPENDENT_AMBULATORY_CARE_PROVIDER_SITE_OTHER): Payer: 59 | Admitting: Thoracic Surgery (Cardiothoracic Vascular Surgery)

## 2011-04-06 VITALS — BP 145/89 | HR 71 | Resp 20 | Ht 69.0 in | Wt 136.0 lb

## 2011-04-06 DIAGNOSIS — D381 Neoplasm of uncertain behavior of trachea, bronchus and lung: Secondary | ICD-10-CM

## 2011-04-06 NOTE — Progress Notes (Signed)
HPI: Mr. Elijah Mcfarland is a 61 year old who I first saw in June of 2012 regarding 2 left upper lobe nodules. There was concern that this could possibly be malignant given his history of smoking even though they appeared more likely to be inflammatory by CT. A PET showed some interval decrease in size the nodules, so we elected to follow him with a repeat CT scan. He had a CT on July 19 which showed near-complete resolution of the lesions. I advised him to return for a repeat CT in 6 months to be actually sure that the lesions were resolved.  Elijah Mcfarland states that he still has some issues with his breathing. He has fairly frequent wheezing and is using inhalers a regular basis. He states that he is now smoking 2 packs of cigarettes daily, where as previously had been smoking 3 packs daily.   Current Outpatient Prescriptions  Medication Sig Dispense Refill  . albuterol (PROVENTIL HFA;VENTOLIN HFA) 108 (90 BASE) MCG/ACT inhaler Inhale 2 puffs into the lungs every 6 (six) hours as needed.      . ALPRAZolam (XANAX) 1 MG tablet Take 1 mg by mouth at bedtime as needed.      Marland Kitchen aspirin 81 MG tablet Take 81 mg by mouth daily.      . Fluticasone-Salmeterol (ADVAIR) 500-50 MCG/DOSE AEPB Inhale 1 puff into the lungs every 12 (twelve) hours.      Marland Kitchen oxyCODONE-acetaminophen (PERCOCET) 5-325 MG per tablet Take 1 tablet by mouth every 4 (four) hours as needed.      . phenytoin (DILANTIN) 100 MG ER capsule Take 300 mg by mouth daily.         Physical Exam: BP 145/89  Pulse 71  Resp 20  Ht 5\' 9"  (1.753 m)  Wt 136 lb (61.689 kg)  BMI 20.08 kg/m2  SpO2 98% Lungs diminished breath sounds bilaterally, mild expiratory wheezing in both upper lung fields  Diagnostic Tests:  CT chest: Findings: Centrilobular emphysema, most prominent in the lung  apices. Patchy lung densities in the left upper lobe and left  lower lobe show improvement. Poorly defined 4 mm nodule in the  superior segment left lower lobe is  unchanged. No new areas of  infiltrate or nodule are identified. Negative for mediastinal  adenopathy. Heart size is normal.  IMPRESSION:  Chronic lung disease with emphysema. Scattered areas of scarring  are noted. Some densities on the prior study have resolved  compatible with inflammatory foci. The left lower lobe 4 mm nodule  is stable. No evidence of carcinoma.    Impression: 61 year old gentleman who was found to have a left upper lobe nodules on CT scan these have now  essentially completely resolved. There is some vague scarring in both lung fields and also evidence of emphysema bilaterally.  I strongly emphasized to Elijah Mcfarland the importance of complete tobacco cessation ordered bare minimum nicotine substitute to stop smoking cigarettes, but his yard he has significant emphysema which is only going to worsen and is still at risk for developing a lung cancer or cardiovascular disease due to smoking. He expresses understanding, but my impression is is not ready to totally quit at this point.  Plan: Will take any further followup is necessary. He will to be followed by Dr. Milford Cage for his medical needs. I would be happy to see him back at any time in the future that could be of any further assistance with his care.

## 2011-11-11 ENCOUNTER — Encounter: Payer: Self-pay | Admitting: Family Medicine

## 2011-11-12 ENCOUNTER — Ambulatory Visit (INDEPENDENT_AMBULATORY_CARE_PROVIDER_SITE_OTHER): Payer: 59 | Admitting: Family Medicine

## 2011-11-12 ENCOUNTER — Encounter: Payer: Self-pay | Admitting: Family Medicine

## 2011-11-12 VITALS — BP 148/93 | HR 76 | Ht 70.0 in | Wt 135.0 lb

## 2011-11-12 DIAGNOSIS — F411 Generalized anxiety disorder: Secondary | ICD-10-CM

## 2011-11-12 DIAGNOSIS — G894 Chronic pain syndrome: Secondary | ICD-10-CM

## 2011-11-12 DIAGNOSIS — J449 Chronic obstructive pulmonary disease, unspecified: Secondary | ICD-10-CM

## 2011-11-12 MED ORDER — FLUTICASONE-SALMETEROL 500-50 MCG/DOSE IN AEPB: 1.0000 | INHALATION_SPRAY | Freq: Two times a day (BID) | RESPIRATORY_TRACT | Status: DC

## 2011-11-12 MED ORDER — PHENYTOIN SODIUM EXTENDED 100 MG PO CAPS: 300.0000 mg | ORAL_CAPSULE | Freq: Every day | ORAL | Status: DC

## 2011-11-12 NOTE — Progress Notes (Signed)
Office Note 11/13/2011  CC:  Chief Complaint  Patient presents with  . Establish Care    transfer Dr. Milford Cage; back pain, needs refills    HPI:  Elijah Mcfarland is a 61 y.o. White male who is here to establish care. Patient's most recent primary MD: Dr. Milford Cage at [facility]. Old records were reviewed prior to or during today's visit.  No acute complaints. Chronic issues: hx of DDD of L-spine with chronic L spine pain with hx of radiculopathy.  He reports seeing Dr. Shelle Iron, Orthopedist, in 2009 or 2010 and pt states he was told he was not a surgical candidate.  No records available at this time.  Describes pain in L spin with all the bending he does at work, also hurts when he tries to sleep at night.  Has been getting treated with oxycodone 5mg  tabs chronically by past PMD--pt states he cannot afford a pain mgmt specialist.  He has been getting #180 oxycodone 5mg  tabs per month as per old records. Says he has run out of these pills, took his last one 3 days ago.  Anxiety/GAD: pt states this is pretty well controlled as currently treated with 1mg  xanax qid, #120 per month. He says he is about to run out of this med, most recent tab taken 10 am today.  COPD: ongoing tobacco dependence.  Denies acute wheezing, chest tightness, or SOB.  Asks for rf of advair but does not need RF on albuterol inhaler.  Past Medical History  Diagnosis Date  . COPD (chronic obstructive pulmonary disease)   . GERD (gastroesophageal reflux disease)   . GAD (generalized anxiety disorder)   . Chronic pain syndrome     Left knee  . Tobacco dependence   . History of pneumonia 06/2010  . History of multiple pulmonary nodules 07/2010    Follow up CT scans showed resolution by 03/2011--NO MALIGNANCY.  . DDD (degenerative disc disease), lumbar 11/2008    L5-S1 on plain film  . Osteoarthritis of left knee     + past injury of this knee--tibia fracture?  +left leg shorter than right  . Seizure disorder     3 total seizures  (first was 1995); neuro eval done and recommended lifetime phenytoin (he had a seizure after coming off the med in the late 90s    Past Surgical History  Procedure Date  . Knee surgery     Left : x 2  . Colonoscopy 2010    At [facility]; normal per pt    History reviewed. No pertinent family history.  History   Social History  . Marital Status: Married    Spouse Name: N/A    Number of Children: N/A  . Years of Education: N/A   Occupational History  . Not on file.   Social History Main Topics  . Smoking status: Current Every Day Smoker    Types: Cigarettes  . Smokeless tobacco: Never Used  . Alcohol Use: Yes     beer  . Drug Use: No  . Sexually Active: Not on file   Other Topics Concern  . Not on file   Social History Narrative   Married, 2 grown childrenEighth grade education.  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    Outpatient Encounter Prescriptions as of 11/12/2011  Medication Sig Dispense Refill  . albuterol (PROVENTIL HFA;VENTOLIN HFA) 108 (90 BASE) MCG/ACT  inhaler Inhale 2 puffs into the lungs every 6 (six) hours as needed.      Marland Kitchen aspirin 81 MG tablet Take 81 mg by mouth daily.      . Fluticasone-Salmeterol (ADVAIR) 500-50 MCG/DOSE AEPB Inhale 1 puff into the lungs every 12 (twelve) hours.  60 each  6  . oxyCODONE-acetaminophen (PERCOCET) 5-325 MG per tablet Take 1 tablet by mouth every 4 (four) hours as needed.      . phenytoin (DILANTIN) 100 MG ER capsule Take 3 capsules (300 mg total) by mouth daily.  30 capsule  6  . DISCONTD: ALPRAZolam (XANAX) 1 MG tablet Take 1 mg by mouth 3 (three) times daily as needed.       Marland Kitchen DISCONTD: Fluticasone-Salmeterol (ADVAIR) 500-50 MCG/DOSE AEPB Inhale 1 puff into the lungs every 12 (twelve) hours.      Marland Kitchen DISCONTD: phenytoin (DILANTIN) 100 MG ER capsule Take 300 mg by mouth  daily.        No Known Allergies  ROS Review of Systems  Constitutional: Negative for fever and fatigue.  HENT: Negative for congestion, sore throat, neck pain, postnasal drip and sinus pressure.   Eyes: Negative for visual disturbance.  Cardiovascular: Negative for chest pain.  Gastrointestinal: Negative for nausea and abdominal pain.  Genitourinary: Negative for dysuria.  Musculoskeletal: Positive for back pain and arthralgias (left knee). Negative for joint swelling.  Skin: Negative for rash.  Neurological: Negative for seizures, weakness and headaches.  Hematological: Negative for adenopathy.  Psychiatric/Behavioral: The patient is nervous/anxious.     PE; Blood pressure 148/93, pulse 76, height 5\' 10"  (1.778 m), weight 135 lb (61.236 kg), SpO2 98.00%. Gen: Alert, well appearing.  Patient is oriented to person, place, time, and situation. Affect: a bit anxious but displayed lucid thought and speech. ENT:  Eyes: no injection, icteris, swelling, or exudate.  EOMI, PERRLA. Nose: no drainage or turbinate edema/swelling.  No injection or focal lesion.  Mouth: lips without lesion/swelling.  Oral mucosa pink and moist.    Oropharynx without erythema, exudate, or swelling.  Neck - No masses or thyromegaly or limitation in range of motion CV: RRR, no m/r/g.   LUNGS: CTA bilat, nonlabored resps.  Diminished BS throughout, exp phase mildly prolonged.   ABD: soft, NT/ND EXT: no clubbing, cyanosis, or edema.   Pertinent labs:  None today  ASSESSMENT AND PLAN:   Chronic pain syndrome Low back and left knee: pt says he cannot afford pain mgmt clinic.  In review of past records there is no sign of escalating dose, overuse, or diversion.  He is unable to give a urine sample for UDS today, so he'll return next week to do this.  In the meantime he'll be completely out of meds (oxycodone and xanax) per his report today, so when he does get a UDS here it should show NO POSITIVES at all. We've  discussed the fact that I'll manage his pain but he must sign a drug contract, bring pill bottles in with him for pill counting, and submit random urine samples for UDS.  He expresses understanding and wants to proceed.  We'll have him sign this once his first UDS comes back negative.  NO rx's given today for any controlled substances.  COPD Stable, but he is still smoking. Advised total cessation.  RF'd his advair today.  ANXIETY GAD. Takes 1mg  xanax qid.  Old records show that he got max of #120 pills for 87mo supply. Will continue with this dosing as long as  his UDS next week comes back clear.   Flu vaccine IM today.   An After Visit Summary was printed and given to the patient.  Return in about 4 weeks (around 12/10/2011) for lab appt for UDS at patient's convenience.  Office f/u in 4 weeks.Marland Kitchen

## 2011-11-13 ENCOUNTER — Encounter: Payer: Self-pay | Admitting: Family Medicine

## 2011-11-13 DIAGNOSIS — G894 Chronic pain syndrome: Secondary | ICD-10-CM | POA: Insufficient documentation

## 2011-11-13 NOTE — Assessment & Plan Note (Signed)
GAD. Takes 1mg  xanax qid.  Old records show that he got max of #120 pills for 87mo supply. Will continue with this dosing as long as his UDS next week comes back clear.

## 2011-11-13 NOTE — Assessment & Plan Note (Signed)
Stable, but he is still smoking. Advised total cessation.  RF'd his advair today.

## 2011-11-13 NOTE — Assessment & Plan Note (Signed)
Low back and left knee: pt says he cannot afford pain mgmt clinic.  In review of past records there is no sign of escalating dose, overuse, or diversion.  He is unable to give a urine sample for UDS today, so he'll return next week to do this.  In the meantime he'll be completely out of meds (oxycodone and xanax) per his report today, so when he does get a UDS here it should show NO POSITIVES at all. We've discussed the fact that I'll manage his pain but he must sign a drug contract, bring pill bottles in with him for pill counting, and submit random urine samples for UDS.  He expresses understanding and wants to proceed.  We'll have him sign this once his first UDS comes back negative.  NO rx's given today for any controlled substances.

## 2011-12-10 ENCOUNTER — Ambulatory Visit: Payer: 59 | Admitting: Family Medicine

## 2012-01-17 ENCOUNTER — Ambulatory Visit: Payer: 59 | Admitting: Family Medicine

## 2012-11-03 ENCOUNTER — Ambulatory Visit (INDEPENDENT_AMBULATORY_CARE_PROVIDER_SITE_OTHER): Payer: 59 | Admitting: Family Medicine

## 2012-11-03 ENCOUNTER — Encounter: Payer: Self-pay | Admitting: Family Medicine

## 2012-11-03 VITALS — BP 140/90 | Temp 97.6°F | Wt 136.4 lb

## 2012-11-03 DIAGNOSIS — Z0000 Encounter for general adult medical examination without abnormal findings: Secondary | ICD-10-CM

## 2012-11-03 DIAGNOSIS — G894 Chronic pain syndrome: Secondary | ICD-10-CM

## 2012-11-03 DIAGNOSIS — J4489 Other specified chronic obstructive pulmonary disease: Secondary | ICD-10-CM

## 2012-11-03 DIAGNOSIS — G40309 Generalized idiopathic epilepsy and epileptic syndromes, not intractable, without status epilepticus: Secondary | ICD-10-CM

## 2012-11-03 DIAGNOSIS — J449 Chronic obstructive pulmonary disease, unspecified: Secondary | ICD-10-CM

## 2012-11-03 DIAGNOSIS — F411 Generalized anxiety disorder: Secondary | ICD-10-CM

## 2012-11-03 LAB — CBC WITH DIFFERENTIAL/PLATELET
Basophils Absolute: 0 10*3/uL (ref 0.0–0.1)
Eosinophils Relative: 12 % — ABNORMAL HIGH (ref 0–5)
HCT: 43 % (ref 39.0–52.0)
Lymphocytes Relative: 29 % (ref 12–46)
MCH: 31.1 pg (ref 26.0–34.0)
MCHC: 34.4 g/dL (ref 30.0–36.0)
MCV: 90.3 fL (ref 78.0–100.0)
Monocytes Absolute: 0.8 10*3/uL (ref 0.1–1.0)
RDW: 14.3 % (ref 11.5–15.5)
WBC: 7.8 10*3/uL (ref 4.0–10.5)

## 2012-11-03 LAB — LIPID PANEL
Cholesterol: 210 mg/dL — ABNORMAL HIGH (ref 0–200)
Triglycerides: 87 mg/dL (ref <150)

## 2012-11-03 LAB — VITAMIN D 25 HYDROXY (VIT D DEFICIENCY, FRACTURES): Vit D, 25-Hydroxy: 29 ng/mL — ABNORMAL LOW (ref 30–89)

## 2012-11-03 LAB — COMPREHENSIVE METABOLIC PANEL
AST: 17 U/L (ref 0–37)
BUN: 8 mg/dL (ref 6–23)
CO2: 30 mEq/L (ref 19–32)
Calcium: 9.3 mg/dL (ref 8.4–10.5)
Chloride: 102 mEq/L (ref 96–112)
Creat: 0.81 mg/dL (ref 0.50–1.35)

## 2012-11-03 LAB — HEMOGLOBIN A1C: Hgb A1c MFr Bld: 5.8 % — ABNORMAL HIGH (ref <5.7)

## 2012-11-03 MED ORDER — OXYCODONE-ACETAMINOPHEN 5-325 MG PO TABS: 1.0000 | ORAL_TABLET | Freq: Three times a day (TID) | ORAL | Status: DC | PRN

## 2012-11-03 MED ORDER — ALPRAZOLAM 1 MG PO TABS: 1.0000 mg | ORAL_TABLET | Freq: Every day | ORAL | Status: DC | PRN

## 2012-11-03 MED ORDER — AEROCHAMBER PLUS FLO-VU MISC: 1.0000 [IU] | Status: DC

## 2012-11-03 MED ORDER — IPRATROPIUM-ALBUTEROL 20-100 MCG/ACT IN AERS: 1.0000 | INHALATION_SPRAY | Freq: Four times a day (QID) | RESPIRATORY_TRACT | Status: DC

## 2012-11-03 NOTE — Progress Notes (Signed)
Subjective:    Patient ID: Elijah Mcfarland, male    DOB: [birth year], 62 y.o.   MRN: [id number]  HPI Elijah Mcfarland comes in today to re-establish care. He used to see Dr. Milford Mcfarland, who left the practice a year ago. He went to see Dr. Marvel Mcfarland once last fall but did not follow up. Per pt this was due to the distance being too far. Although he has several other medical issues and needs a cpe, his main concerns today are:  Chronic Pain - knee pain, back pain - He has seen [facility] in [place] a few years ago and told he was not a surgical candidate for either the back or the knee. Was told he had degenerative disk disease. He has not been to PT. He said he went to PM once and was not happy that the doctor wanted to inject his knee. The PM physician also tried him on antidepressant medicine that the pt did not feel helped. He stopped going after this. He also says he is unable to go to PM due to the cost. He has been on narcotics for about 5-6 years for the back pain especially. He has been taking 3-4 percocet 5-325's per day (scripts monthly of 120 pills. He says he most recently has taken percocet 2 days ago when a friend gave him some as he himself had run out.  Anxiety - For the past 2 years he has had panic attacks several times daily. For this problem he has been taking 1mg  xanax tabs  3-4 times daily. He has been receiving 120 tabs per month until Dr. Milford Mcfarland left, and now he has been borrowing from his wife and friends. He has had no BH/psych workup regarding the anxiety. He has tried antidepressants (not sure what type) and klonopin but they did not help.   COPD - smoked since age 69, was up to 3ppd for many years but now smokes 1/2ppd. Uses advair and albuterol. Wakes up at night needing inhaler. Currently out of advair. Had PFTs last year.   H/O seizure d/o - started in 1995 (2 seizures in 1 day), saw neuro and was told was from stress after workup. One is 2007, none since. Not on any meds for  seizures.     Review of Systems pos: anxiety, knee and back pain     Objective:   Physical Exam  Nursing note and vitals reviewed. Constitutional: He is oriented to person, place, and time. He appears well-developed and well-nourished.  HENT:  Right Ear: External ear normal.  Left Ear: External ear normal.  Mouth/Throat: Oropharynx is clear and moist.  Neck: Normal range of motion. Neck supple.  Cardiovascular: Normal rate, regular rhythm and normal heart sounds.   Pulmonary/Chest: No respiratory distress. He has wheezes. He has no rales.  Abdominal: Soft.  Lymphadenopathy:    He has no cervical adenopathy.  Neurological: He is alert and oriented to person, place, and time.  Skin: Skin is warm and dry.  Psychiatric: He has a normal mood and affect.          Assessment & Mcfarland:   COPD - Mcfarland: Ipratropium-Albuterol (COMBIVENT) 20-100 MCG/ACT AERS respimat, Spacer/Aero-Holding Chambers (AEROCHAMBER PLUS FLO-VU) MISC, CBC with Differential  Chronic pain syndrome - Mcfarland: oxyCODONE-acetaminophen (PERCOCET/ROXICET) 5-325 MG per tablet, Ambulatory referral to Pain Clinic, Drug Screen, Urine  ANXIETY - Mcfarland: ALPRAZolam (XANAX) 1 MG tablet, Ambulatory referral to Psychiatry, Drug Screen, Urine  GRAND MAL SEIZURE - Mcfarland: Comprehensive metabolic panel  Well adult exam - Mcfarland: CBC with Differential, Comprehensive metabolic panel, Hemoglobin A1c, Lipid panel, Microalbumin, urine, TSH, Vit D  25 hydroxy (rtn osteoporosis monitoring), Hepatitis C antibody  Will f/u on reports from specialists in PM and BH/psych regarding the narcotics/pain meds and anxirty treatment. If specialists are able to get him stabilized on a med and dose, I will work with the pt and prescribe meds between his visits to the specialists if cost is the factor. Ideally, I'd like to see him on other treatments for these problems, especially regarding the benzos. As he has not taken 3-4 tabs daily for a year, it is clear  he does not need his original dosing. Consultants: please feel free to call and discuss with me if indicated. I have given his 30 tabs of xanax and percocet which need to last him the month. There will be NO REFILLS until such time as I have reviewed reports from specialists and then will be working in tandem with them, if indicated. UDS obtained today.  Copd - change albuterol to combivent, add spacer, refill advair, needs flu shot  H/o sz d/o - he has had no seizures since 2007 (7 years) and before that he had none for 12 years. He has been off sz medication for 11 months. i see no reason to restart it.   Drew cpe labs today - will have hin rtc 1 month to review labs, do cpe, and discuss recommendations from consultants.

## 2012-11-04 LAB — DRUG SCREEN, URINE
Amphetamine Screen, Ur: NEGATIVE
Benzodiazepines.: POSITIVE — AB
Cocaine Metabolites: NEGATIVE
Phencyclidine (PCP): NEGATIVE

## 2012-11-17 ENCOUNTER — Telehealth: Payer: Self-pay | Admitting: *Deleted

## 2012-11-17 NOTE — Telephone Encounter (Signed)
Please let the pt know i have reviewed his labs. I appologize for the misunderstanding - i told him at our visit that the labs were for his cpe and unless something was urgent, we would discuss the results at his cpe visit. If he was expecting a call regardless I must not have been clear. Regardless, labs in general were fine. His a1c is slightly elevated and his vitamin D was slightly low.Will likely have his see DM ed at his convenience and start on vitamin D supplements, but neither of these is anything we need to worry about at this time - will discuss at his visit. Thanks AW

## 2012-11-17 NOTE — Telephone Encounter (Signed)
Pt notified and appreciative.

## 2012-11-17 NOTE — Telephone Encounter (Signed)
Pt called and left VM stating that he never received results on lab work. He is requesting that he be called with results. Will route to MD.

## 2012-12-08 ENCOUNTER — Ambulatory Visit (INDEPENDENT_AMBULATORY_CARE_PROVIDER_SITE_OTHER): Payer: 59 | Admitting: Family Medicine

## 2012-12-08 ENCOUNTER — Encounter: Payer: Self-pay | Admitting: Family Medicine

## 2012-12-08 VITALS — BP 122/66 | HR 83 | Temp 97.4°F | Ht 70.0 in | Wt 143.1 lb

## 2012-12-08 DIAGNOSIS — E559 Vitamin D deficiency, unspecified: Secondary | ICD-10-CM

## 2012-12-08 DIAGNOSIS — R7303 Prediabetes: Secondary | ICD-10-CM

## 2012-12-08 DIAGNOSIS — R7309 Other abnormal glucose: Secondary | ICD-10-CM

## 2012-12-08 MED ORDER — CHOLECALCIFEROL 1.25 MG (50000 UT) PO CAPS: ORAL_CAPSULE | ORAL | Status: DC

## 2012-12-08 NOTE — Progress Notes (Signed)
  Subjective:    Patient ID: Elijah Mcfarland, male    DOB: 1950-03-23, 62 y.o.   MRN: [id number]  HPIPt here for f/u on labs today. Discussed that for the most part things look good. I am concerned about:  A1c - slightly elevated at 5.8, pt says he doesn't have a strong FH of DM (a cousin has it). He is as active as his copd allows, is not overweight, and does not eat a lot of carbs or sweets.   Vitamin D low at 29.   Colesterol elevated at 210t, 125 ldl. He has never taken cholesterol meds. He does not like oatmeal.  His COPD is stable and he has not had any exacerbations.   He continues to have chronic pain and anxiety but declines going to psych or PM due to money (referrals placed last time). He understands that we will not prescribe narcotics or benzos here. He took one xanax last night but had not taken one for several days prior. He says he has been taking 1 tab every few days.    Review of Systems per hpi     Objective:   Physical Exam  Nursing note and vitals reviewed. Constitutional: he is oriented to person, place, and time. he appears well-developed and well-nourished.  Cardiovascular: Normal rate, regular rhythm and normal heart sounds.   Pulmonary/Chest: Effort normal and breath sounds normal.  Neurological: he is alert and oriented to person, place, and time. Skin: Skin is warm and dry.  Psychiatric: he has a normal mood and affect.         Assessment & Plan:  preDM - refer to DM ed  Vit D - supplement for 8 weeks then recheck   Cholesterol - pt would like to try dietary modifications first before startimg medication. Will recheck at next blood draw.   Copd - let us know if needs refills or starts feeling unwell  Chronic pain and anxiety - given infrequency that he has been taking the xanax and percocet, I dont think he needs a taper prescription for either. If he changes his mind and owuld like to f/u on the referrals he will let us know.   rtc 3 mos for  vit D, cholesterol, and CPE

## 2012-12-14 ENCOUNTER — Telehealth (HOSPITAL_COMMUNITY): Payer: Self-pay | Admitting: Dietician

## 2012-12-14 NOTE — Telephone Encounter (Signed)
Located referral in work que today. Originally entered on 12/08/12 by [facility] staff for dx: prediabetes.

## 2012-12-14 NOTE — Telephone Encounter (Signed)
Called at 1148. Appointment scheduled for 12/29/12 at 1100. Requests notifications be sent to his home regarding appointment. Mailed letter confirming time and appointment location.

## 2012-12-29 ENCOUNTER — Telehealth (HOSPITAL_COMMUNITY): Payer: Self-pay | Admitting: Dietician

## 2012-12-29 NOTE — Telephone Encounter (Signed)
Pt requested written confirmation of appointment. Letter sent to pt home.

## 2012-12-29 NOTE — Telephone Encounter (Signed)
Called back at 1014. 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 Appointment rescheduled for 01/19/13 at 0900.

## 2012-12-29 NOTE — Telephone Encounter (Signed)
Received voicemail from pt left at [phone]. He reports he is unable to make it to appointment today.

## 2013-01-18 ENCOUNTER — Telehealth (HOSPITAL_COMMUNITY): Payer: Self-pay | Admitting: Dietician

## 2013-01-18 NOTE — Telephone Encounter (Signed)
Received phone call from pt. He reports he is unable to make it to tomorrow's appointment. Appointment rescheduled for 02/23/13 at 0900.

## 2013-02-22 ENCOUNTER — Telehealth (HOSPITAL_COMMUNITY): Payer: Self-pay | Admitting: Dietician

## 2013-02-22 NOTE — Telephone Encounter (Signed)
Received voicemail from pt at [phone]. Returned call at [phone]. Pt reports he is unsure if he should come to the appointment, as he has a cold. Pt would feel better if he rescheduled appointment. Advised that pt could reschedule now or wait until he is feeling better. Pt elected to reschedule at a later time. Reports he will call back next week.  Noted this is the 3rd appointment that pt has rescheduled in the past 2 months.

## 2013-03-16 ENCOUNTER — Encounter: Payer: 59 | Admitting: Family Medicine

## 2013-04-13 ENCOUNTER — Encounter: Payer: Self-pay | Admitting: Family Medicine

## 2013-04-13 ENCOUNTER — Telehealth: Payer: Self-pay | Admitting: Family Medicine

## 2013-04-13 ENCOUNTER — Ambulatory Visit (INDEPENDENT_AMBULATORY_CARE_PROVIDER_SITE_OTHER): Payer: 59 | Admitting: Family Medicine

## 2013-04-13 VITALS — BP 138/70 | HR 98 | Temp 97.7°F | Resp 18 | Ht 70.0 in | Wt 143.0 lb

## 2013-04-13 DIAGNOSIS — R7303 Prediabetes: Secondary | ICD-10-CM

## 2013-04-13 DIAGNOSIS — F17200 Nicotine dependence, unspecified, uncomplicated: Secondary | ICD-10-CM

## 2013-04-13 DIAGNOSIS — J449 Chronic obstructive pulmonary disease, unspecified: Secondary | ICD-10-CM

## 2013-04-13 DIAGNOSIS — Z23 Encounter for immunization: Secondary | ICD-10-CM

## 2013-04-13 DIAGNOSIS — F411 Generalized anxiety disorder: Secondary | ICD-10-CM

## 2013-04-13 DIAGNOSIS — J4489 Other specified chronic obstructive pulmonary disease: Secondary | ICD-10-CM

## 2013-04-13 DIAGNOSIS — Z0000 Encounter for general adult medical examination without abnormal findings: Secondary | ICD-10-CM

## 2013-04-13 DIAGNOSIS — E782 Mixed hyperlipidemia: Secondary | ICD-10-CM

## 2013-04-13 DIAGNOSIS — R7309 Other abnormal glucose: Secondary | ICD-10-CM

## 2013-04-13 MED ORDER — GLUCOSE BLOOD VI STRP: ORAL_STRIP | Status: DC

## 2013-04-13 MED ORDER — RELION CONFIRM GLUCOSE MONITOR W/DEVICE KIT: PACK | Status: DC

## 2013-04-13 MED ORDER — ALPRAZOLAM 1 MG PO TABS: 1.0000 mg | ORAL_TABLET | Freq: Every day | ORAL | Status: DC | PRN

## 2013-04-13 MED ORDER — FLUTICASONE-SALMETEROL 500-50 MCG/DOSE IN AEPB: 1.0000 | INHALATION_SPRAY | Freq: Two times a day (BID) | RESPIRATORY_TRACT | Status: DC

## 2013-04-13 MED ORDER — IPRATROPIUM-ALBUTEROL 20-100 MCG/ACT IN AERS: 1.0000 | INHALATION_SPRAY | Freq: Four times a day (QID) | RESPIRATORY_TRACT | Status: DC

## 2013-04-13 MED ORDER — RELION LANCETS ULTRA-THIN 30G MISC: Status: DC

## 2013-04-13 NOTE — Patient Instructions (Signed)

## 2013-04-13 NOTE — Telephone Encounter (Signed)
Please let pt know that since it has been 2 years since his last CT chest, we should repeat it using low dose radiation as a screening tool for lung cancer given his smoking history as well as lung nodule history. Thanks AW

## 2013-04-13 NOTE — Telephone Encounter (Signed)
Pt notified and appreciative. Stated he would like to have CT scheduled on a Friday if possible and if not to please call him prior to scheduling a different day.

## 2013-04-13 NOTE — Progress Notes (Signed)
Subjective:    Patient ID: Elijah Mcfarland, male    DOB: October 03, 1950, 63 y.o.   MRN: [id number]  HPI  Pt here for f/u. He has not heard back about the BH/PM referrals placed last fall.  In January his wife of 42 years left him to be with another man. She drained his bank account leaving him with $40 and did not pay bills since November. Pt understandably distressed over this as he did not have any idea it was going to happen. Denies si/hi but says his anxiety has been worse thanusual   Copd has been at baseline except when he gets a uri. At those times he gets shob walking anywhere and needs his home O2 at night. Had a scare 2 years ago that may have lung cancer but f/u CT was wnl.   PreDM - has not seen DM ed yet as he had to reschedule due to illness.    # Health maint - mammo/pert FH - no FH breast ca - PSA and DRE- discussed pros and cons of psa including usptf's reccomendation against. Pt rewuests psa testing. - c-scope/pert FH - had 5 years ago, told to repeat in 10 yrs. - dexa - no h/o pathological fractures - BP screen - today - cholesterol screen - done last fall, pt declined meds, wanted to try lifestyle mods first. Will recheck today - fasting glucose screen -  Has dx of predm - chlamydia screen (if male under age 26) - declines std testing - tobacco - >30 py smoking history, had CT 2012 - alcohol - denies - drugs - denies - Hep C (born [phone]) - ordered last visit but not performed, will reorder - tdap (once over age 74 in [address] of DT) - pt believes to be utd. He will check records and let us know. - DT (every 10 years until age 55, once age 15+) has in 2010 - flu - today - pneumovax - had 9/09 - zostavax (if over 60) - not avail today, will plan to offer at next visit - vit D - checked last visit, low, pt got some otc vit D (unsure of dosing) and has beentaking daily. Will recheck level today.      Review of Systems A 12 point review of systems is negative  except as per hpi.       Objective:   Physical Exam  Nursing note and vitals reviewed. Constitutional: He is oriented to person, place, and time. He  appears well-developed and well-nourished.  HENT:  Right Ear: External ear normal.  Left Ear: External ear normal.  Nose: Nose normal.  Mouth/Throat: Oropharynx is clear and moist. No oropharyngeal exudate.  Eyes: Conjunctivae are normal. Pupils are equal, round, and reactive to light.  Neck: Normal range of motion. Neck supple. No thyromegaly present.  Cardiovascular: Normal rate, regular rhythm and normal heart sounds.   Pulmonary/Chest: Effort normal and breath sounds normal. Poor air movement, baseline for him.  Abdominal: Soft. Bowel sounds are normal.  no distension. There is no tenderness. There is no rebound.  Lymphadenopathy:    He has no cervical adenopathy.  Neurological: He is alert and oriented to person, place, and time. He has normal reflexes.  Skin: Skin is warm and dry.He has no concerning moles or skin lesions Psychiatric: He has a normal mood and affect. His behavior is normal.       Assessment & Plan:  [place] was seen today for follow-up.  Diagnoses and associated orders  for this visit:  Prediabetes - Blood Glucose Monitoring Suppl (RELION CONFIRM GLUCOSE MONITOR) W/DEVICE KIT; Use as directed - glucose blood test strip; Use as instructed - ReliOn Ultra Thin Lancets MISC; Use as directed  COPD - Ipratropium-Albuterol (COMBIVENT) 20-100 MCG/ACT AERS respimat; Inhale 1-2 puffs into the lungs every 6 (six) hours. - Fluticasone-Salmeterol (ADVAIR) 500-50 MCG/DOSE AEPB; Inhale 1 puff into the lungs every 12 (twelve) hours.  ANXIETY - ALPRAZolam (XANAX) 1 MG tablet; Take 1 tablet (1 mg total) by mouth daily as needed for sleep or anxiety (1/2 tab - 1 tab po q6 hours anxiety or sleep).  HYPERLIPIDEMIA, MIXED - Lipid Panel  TOBACCO ABUSE  Health maintenance examination - Vit D  25 hydroxy (rtn osteoporosis  monitoring) - Flu vaccine greater than or equal to 3yo preservative free IM - US Aorta; Future - PSA - Hepatitis C antibody    rtc 6 mos f/u

## 2013-04-14 LAB — LIPID PANEL
Cholesterol: 211 mg/dL — ABNORMAL HIGH (ref 0–200)
HDL: 86 mg/dL (ref >39)
LDL CALC: 112 mg/dL — AB (ref 0–99)
TRIGLYCERIDES: 66 mg/dL (ref <150)
Total CHOL/HDL Ratio: 2.5 Ratio
VLDL: 13 mg/dL (ref 0–40)

## 2013-04-14 LAB — HEPATITIS C ANTIBODY: HCV Ab: NEGATIVE

## 2013-04-14 LAB — VITAMIN D 25 HYDROXY (VIT D DEFICIENCY, FRACTURES): Vit D, 25-Hydroxy: 10 ng/mL — ABNORMAL LOW (ref 30–89)

## 2013-04-14 LAB — PSA: PSA: 1.16 ng/mL (ref <=4.00)

## 2013-04-16 ENCOUNTER — Telehealth: Payer: Self-pay | Admitting: *Deleted

## 2013-04-16 NOTE — Telephone Encounter (Signed)
Called number available, no answer, message left for callback.

## 2013-04-16 NOTE — Telephone Encounter (Signed)
Message copied by [facility], Louis Meckel on Mon Apr 16, 2013 11:53 AM ------      Message from: Doran Heater      Created: Mon Apr 16, 2013  8:26 AM       Cholesterol high, vit D low. Please have him make an appt to see me in 2-3 weeks, giving time for pfts, ct, u/s to be done. We can discuss all results at that time. Thanks aw ------

## 2013-04-19 ENCOUNTER — Telehealth: Payer: Self-pay | Admitting: *Deleted

## 2013-04-19 NOTE — Telephone Encounter (Signed)
Spoke with pt and gave results and informed him that MD wanted him to f/u in 2-3 weeks. Questioned if he has had CT or U/S done and pt stated no that he has not heard from anyone concerning tests. Informed him that we would try to find out what is going on and let him know.

## 2013-04-19 NOTE — Telephone Encounter (Signed)
It is scheduled for Friday as requested by pt.  He was notified of this already. I will call him again.

## 2013-04-19 NOTE — Telephone Encounter (Signed)
Thank you :)

## 2013-04-19 NOTE — Progress Notes (Signed)
See telephone encounter.

## 2013-04-19 NOTE — Telephone Encounter (Signed)
Message copied by [facility], Louis Meckel on Thu Apr 19, 2013  9:59 AM ------      Message from: Doran Heater      Created: Mon Apr 16, 2013  8:26 AM       Cholesterol high, vit D low. Please have him make an appt to see me in 2-3 weeks, giving time for pfts, ct, u/s to be done. We can discuss all results at that time. Thanks aw ------

## 2013-04-27 ENCOUNTER — Ambulatory Visit (HOSPITAL_COMMUNITY)
Admission: RE | Admit: 2013-04-27 | Discharge: 2013-04-27 | Disposition: A | Discharge status: Home / Self Care | Payer: 59 | Source: Ambulatory Visit | Attending: Family Medicine | Admitting: Family Medicine

## 2013-04-27 DIAGNOSIS — Z1389 Encounter for screening for other disorder: Secondary | ICD-10-CM | POA: Insufficient documentation

## 2013-05-01 ENCOUNTER — Telehealth: Payer: Self-pay | Admitting: *Deleted

## 2013-05-01 NOTE — Telephone Encounter (Signed)
Message copied by [facility], [place], Louis Meckel on Tue May 01, 2013  9:36 AM ------      Message from: Doran Heater      Created: Fri Apr 27, 2013  2:59 PM       Please let pt know no AAA on [place] ------

## 2013-05-01 NOTE — Progress Notes (Signed)
See telephone encounter.

## 2013-05-01 NOTE — Telephone Encounter (Signed)
Pt called and notified of [place] results. He was appreciative and understanding.

## 2013-05-11 ENCOUNTER — Ambulatory Visit: Payer: 59 | Admitting: Family Medicine

## 2013-12-19 ENCOUNTER — Emergency Department (HOSPITAL_COMMUNITY)
Admission: EM | Admit: 2013-12-19 | Discharge: 2013-12-19 | Disposition: A | Discharge status: Home / Self Care | Payer: 59 | Attending: Emergency Medicine | Admitting: Emergency Medicine

## 2013-12-19 ENCOUNTER — Encounter (HOSPITAL_COMMUNITY): Payer: Self-pay | Admitting: Emergency Medicine

## 2013-12-19 ENCOUNTER — Emergency Department (HOSPITAL_COMMUNITY): Payer: 59

## 2013-12-19 DIAGNOSIS — S299XXA Unspecified injury of thorax, initial encounter: Secondary | ICD-10-CM

## 2013-12-19 DIAGNOSIS — R251 Tremor, unspecified: Secondary | ICD-10-CM | POA: Insufficient documentation

## 2013-12-19 DIAGNOSIS — J449 Chronic obstructive pulmonary disease, unspecified: Secondary | ICD-10-CM | POA: Insufficient documentation

## 2013-12-19 DIAGNOSIS — G40909 Epilepsy, unspecified, not intractable, without status epilepticus: Secondary | ICD-10-CM | POA: Insufficient documentation

## 2013-12-19 DIAGNOSIS — F411 Generalized anxiety disorder: Secondary | ICD-10-CM

## 2013-12-19 DIAGNOSIS — M5136 Other intervertebral disc degeneration, lumbar region: Secondary | ICD-10-CM | POA: Insufficient documentation

## 2013-12-19 DIAGNOSIS — F419 Anxiety disorder, unspecified: Secondary | ICD-10-CM | POA: Insufficient documentation

## 2013-12-19 DIAGNOSIS — R55 Syncope and collapse: Secondary | ICD-10-CM | POA: Insufficient documentation

## 2013-12-19 DIAGNOSIS — M1990 Unspecified osteoarthritis, unspecified site: Secondary | ICD-10-CM | POA: Insufficient documentation

## 2013-12-19 DIAGNOSIS — G40919 Epilepsy, unspecified, intractable, without status epilepticus: Secondary | ICD-10-CM

## 2013-12-19 DIAGNOSIS — W06XXXA Fall from bed, initial encounter: Secondary | ICD-10-CM | POA: Insufficient documentation

## 2013-12-19 DIAGNOSIS — S29001A Unspecified injury of muscle and tendon of front wall of thorax, initial encounter: Secondary | ICD-10-CM | POA: Insufficient documentation

## 2013-12-19 DIAGNOSIS — Z7982 Long term (current) use of aspirin: Secondary | ICD-10-CM | POA: Insufficient documentation

## 2013-12-19 DIAGNOSIS — Y9289 Other specified places as the place of occurrence of the external cause: Secondary | ICD-10-CM | POA: Insufficient documentation

## 2013-12-19 DIAGNOSIS — R509 Fever, unspecified: Secondary | ICD-10-CM | POA: Insufficient documentation

## 2013-12-19 DIAGNOSIS — Z79899 Other long term (current) drug therapy: Secondary | ICD-10-CM | POA: Insufficient documentation

## 2013-12-19 DIAGNOSIS — G894 Chronic pain syndrome: Secondary | ICD-10-CM | POA: Insufficient documentation

## 2013-12-19 DIAGNOSIS — Z8701 Personal history of pneumonia (recurrent): Secondary | ICD-10-CM | POA: Insufficient documentation

## 2013-12-19 DIAGNOSIS — Y9389 Activity, other specified: Secondary | ICD-10-CM | POA: Insufficient documentation

## 2013-12-19 DIAGNOSIS — K219 Gastro-esophageal reflux disease without esophagitis: Secondary | ICD-10-CM | POA: Insufficient documentation

## 2013-12-19 DIAGNOSIS — Z791 Long term (current) use of non-steroidal anti-inflammatories (NSAID): Secondary | ICD-10-CM | POA: Insufficient documentation

## 2013-12-19 DIAGNOSIS — Z720 Tobacco use: Secondary | ICD-10-CM | POA: Insufficient documentation

## 2013-12-19 DIAGNOSIS — Z7951 Long term (current) use of inhaled steroids: Secondary | ICD-10-CM | POA: Insufficient documentation

## 2013-12-19 HISTORY — DX: Emphysema, unspecified: J43.9

## 2013-12-19 LAB — CBC WITH DIFFERENTIAL/PLATELET
BASOS PCT: 0 % (ref 0–1)
Basophils Absolute: 0 10*3/uL (ref 0.0–0.1)
Eosinophils Absolute: 0.6 10*3/uL (ref 0.0–0.7)
Eosinophils Relative: 7 % — ABNORMAL HIGH (ref 0–5)
HCT: 43.4 % (ref 39.0–52.0)
Hemoglobin: 14.7 g/dL (ref 13.0–17.0)
Lymphocytes Relative: 16 % (ref 12–46)
Lymphs Abs: 1.3 10*3/uL (ref 0.7–4.0)
MCH: 30.2 pg (ref 26.0–34.0)
MCHC: 33.9 g/dL (ref 30.0–36.0)
MCV: 89.1 fL (ref 78.0–100.0)
Monocytes Absolute: 0.6 10*3/uL (ref 0.1–1.0)
Monocytes Relative: 7 % (ref 3–12)
NEUTROS PCT: 70 % (ref 43–77)
Neutro Abs: 5.5 10*3/uL (ref 1.7–7.7)
PLATELETS: 179 10*3/uL (ref 150–400)
RBC: 4.87 MIL/uL (ref 4.22–5.81)
RDW: 14.5 % (ref 11.5–15.5)
WBC: 8 10*3/uL (ref 4.0–10.5)

## 2013-12-19 LAB — COMPREHENSIVE METABOLIC PANEL
ALK PHOS: 49 U/L (ref 39–117)
ALT: 6 U/L (ref 0–53)
ANION GAP: 12 (ref 5–15)
AST: 17 U/L (ref 0–37)
Albumin: 3.9 g/dL (ref 3.5–5.2)
BUN: 7 mg/dL (ref 6–23)
CALCIUM: 9.4 mg/dL (ref 8.4–10.5)
CO2: 22 meq/L (ref 19–32)
Chloride: 103 mEq/L (ref 96–112)
Creatinine, Ser: 0.79 mg/dL (ref 0.50–1.35)
GLUCOSE: 135 mg/dL — AB (ref 70–99)
Potassium: 4.1 mEq/L (ref 3.7–5.3)
SODIUM: 137 meq/L (ref 137–147)
Total Bilirubin: 0.4 mg/dL (ref 0.3–1.2)
Total Protein: 7.3 g/dL (ref 6.0–8.3)

## 2013-12-19 MED ORDER — PHENYTOIN SODIUM 50 MG/ML IJ SOLN
INTRAMUSCULAR | Status: AC
  Filled 2013-12-19: qty 20

## 2013-12-19 MED ORDER — ALPRAZOLAM 0.5 MG PO TABS: 1.0000 mg | ORAL_TABLET | Freq: Every day | ORAL | Status: DC | PRN

## 2013-12-19 MED ORDER — PHENYTOIN SODIUM EXTENDED 100 MG PO CAPS: 300.0000 mg | ORAL_CAPSULE | Freq: Every day | ORAL | Status: DC

## 2013-12-19 MED ORDER — LORAZEPAM 2 MG/ML IJ SOLN
0.5000 mg | Freq: Once | INTRAMUSCULAR | Status: AC
  Administered 2013-12-19: 0.5 mg via INTRAVENOUS
  Filled 2013-12-19: qty 1

## 2013-12-19 MED ORDER — SODIUM CHLORIDE 0.9 % IV SOLN
1000.0000 mg | Freq: Once | INTRAVENOUS | Status: AC
  Administered 2013-12-19: 1000 mg via INTRAVENOUS
  Filled 2013-12-19: qty 20

## 2013-12-19 NOTE — ED Notes (Signed)
Per pt's wife, pt took forward roll out of bed striking head and left side, possible seizure, increased heart rate.

## 2013-12-19 NOTE — ED Provider Notes (Signed)
CSN: [id number]     Arrival date & time 12/19/13  0440 History   First MD Initiated Contact with Patient 12/19/13 0440     Chief Complaint  Patient presents with  . Seizures     (Consider location/radiation/quality/duration/timing/severity/associated sxs/prior Treatment) HPI Patient has a history of COPD, seizure disorder, anxiety. He has been out of all medications for the past 5 months. Had a witness tonic-clonic episode this evening. Wife states patient rolled out of bed and was unresponsive. Unknown length of time. Mild drowsiness which is now resolved. Patient is tremulous and states this is his baseline. Denies any alcohol intake for the past week. Denies headache or neck pain. No recent fever or chills.no intraoral trauma. No incontinence. Patient does complain of left-sided rib pain after fall. Past Medical History  Diagnosis Date  . COPD (chronic obstructive pulmonary disease)   . GERD (gastroesophageal reflux disease)   . GAD (generalized anxiety disorder)   . Chronic pain syndrome     Left knee  . Tobacco dependence   . History of pneumonia 06/2010  . History of multiple pulmonary nodules 07/2010    Follow up CT scans showed resolution by 03/2011--NO MALIGNANCY.  . DDD (degenerative disc disease), lumbar 11/2008    L5-S1 on plain film  . Osteoarthritis of left knee     + past injury of this knee--tibia fracture?  +left leg shorter than right  . Seizure disorder     3 total seizures (first was 1995); neuro eval done and recommended lifetime phenytoin (he had a seizure after coming off the med in the late 90s  . Emphysema lung    Past Surgical History  Procedure Laterality Date  . Knee surgery      Left : x 2  . Colonoscopy  2010    At [facility]; normal per pt   History reviewed. No pertinent family history. History  Substance Use Topics  . Smoking status: Current Every Day Smoker -- 0.50 packs/day    Types: Cigarettes  . Smokeless tobacco: Never Used  . Alcohol Use: Yes      Comment: beer    Review of Systems  Constitutional: Positive for fever. Negative for chills.  HENT: Negative for facial swelling.   Respiratory: Negative for cough and shortness of breath.   Cardiovascular: Positive for chest pain.  Gastrointestinal: Negative for nausea, vomiting and abdominal pain.  Musculoskeletal: Negative for back pain, neck pain and neck stiffness.  Skin: Negative for rash and wound.  Neurological: Positive for tremors, seizures and syncope. Negative for weakness, numbness and headaches.  Psychiatric/Behavioral: The patient is nervous/anxious.   All other systems reviewed and are negative.     Allergies  Review of patient's allergies indicates no known allergies.  Home Medications   Prior to Admission medications   Medication Sig Start Date End Date Taking? Authorizing Provider  aspirin 81 MG tablet Take 81 mg by mouth daily.   Yes Historical Provider, MD  naproxen sodium (ANAPROX) 220 MG tablet Take 220 mg by mouth 2 (two) times daily with a meal.   Yes Historical Provider, MD  ALPRAZolam (XANAX) 0.5 MG tablet Take 2 tablets (1 mg total) by mouth daily as needed for anxiety or sleep. 12/19/13   Julianne Rice, MD  Blood Glucose Monitoring Suppl (RELION CONFIRM GLUCOSE MONITOR) W/DEVICE KIT Use as directed 04/13/13   Doran Heater, MD  Cholecalciferol 50000 UNITS capsule 1 tab weekly for 8 weeks 12/08/12   Doran Heater, MD  Fluticasone-Salmeterol (  ADVAIR) 500-50 MCG/DOSE AEPB Inhale 1 puff into the lungs every 12 (twelve) hours. 04/13/13   Doran Heater, MD  glucose blood test strip Use as instructed 04/13/13   Doran Heater, MD  Ipratropium-Albuterol (COMBIVENT) 20-100 MCG/ACT AERS respimat Inhale 1-2 puffs into the lungs every 6 (six) hours. 04/13/13   Doran Heater, MD  oxyCODONE-acetaminophen (PERCOCET/ROXICET) 5-325 MG per tablet Take 1 tablet by mouth every 8 (eight) hours as needed. 11/03/12   Doran Heater, MD  phenytoin (DILANTIN) 100 MG ER  capsule Take 3 capsules (300 mg total) by mouth daily. 12/19/13   Julianne Rice, MD  ReliOn Ultra Thin Lancets MISC Use as directed 04/13/13   Doran Heater, MD  Spacer/Aero-Holding Chambers (AEROCHAMBER PLUS FLO-VU) MISC 1 Units by Does not apply route as directed. 11/03/12   Doran Heater, MD   BP 140/101 mmHg  Pulse 94  Temp(Src) 98.6 F (37 C) (Oral)  Resp 19  Ht _0  (1.778 m)  Wt 135 lb (61.236 kg)  BMI 19.37 kg/m2  SpO2 99% Physical Exam  Constitutional: He is oriented to person, place, and time. He appears well-developed and well-nourished. No distress.  Anxious appearing  HENT:  Head: Normocephalic and atraumatic.  Mouth/Throat: Oropharynx is clear and moist.  No intraoral trauma  Eyes: EOM are normal. Pupils are equal, round, and reactive to light.  Neck: Normal range of motion. Neck supple.  No posterior midline cervical tenderness to palpation.  Cardiovascular: Regular rhythm.   tachycardia  Pulmonary/Chest: Effort normal and breath sounds normal. No respiratory distress. He has no wheezes. He has no rales. He exhibits tenderness (mild tenderness to palpation over the left lateral lower ribs.no crepitance or deformity.).  Abdominal: Soft. Bowel sounds are normal. He exhibits no distension and no mass. There is no tenderness. There is no rebound and no guarding.  Musculoskeletal: Normal range of motion. He exhibits no edema or tenderness.  No calf swelling or tenderness.no midline thoracic or lumbar tenderness.  Neurological: He is alert and oriented to person, place, and time.  Moves all extremities without deficit. Sensation is grossly intact. Fine tremor.  Skin: Skin is warm and dry. No rash noted. No erythema.  Psychiatric: His behavior is normal.  Nursing note and vitals reviewed.   ED Course  Procedures (including critical care time) Labs Review Labs Reviewed  COMPREHENSIVE METABOLIC PANEL - Abnormal; Notable for the following:    Glucose, Bld 135 (*)     All other components within normal limits  CBC WITH DIFFERENTIAL - Abnormal; Notable for the following:    Eosinophils Relative 7 (*)    All other components within normal limits    Imaging Review No results found.   EKG Interpretation None      Date: 12/19/2013  Rate: 118  Rhythm: sinus tachycardia  QRS Axis: normal  Intervals: normal  ST/T Wave abnormalities: normal  Conduction Disutrbances:none  Narrative Interpretation:   Old EKG Reviewed: none available   MDM   Final diagnoses:  Chest trauma  Breakthrough seizure  Anxiety  Anxiety state     Patient with stable vital signs in the emergency department. Dilantin was loaded. Advised to take medication as prescribed. Return precautions given.   Julianne Rice, MD 12/21/13 [phone]

## 2013-12-19 NOTE — ED Notes (Addendum)
Patient placed in gown and on cardiac monitor. Patient's EKG done and given to EDP. Both rails up and seizure pads in place for safety.

## 2013-12-19 NOTE — Discharge Instructions (Signed)
Epilepsy Epilepsy is a disorder in which a person has repeated seizures over time. A seizure is a release of abnormal electrical activity in the brain. Seizures can cause a change in attention, behavior, or the ability to remain awake and alert (altered mental status). Seizures often involve uncontrollable shaking (convulsions).  Most people with epilepsy lead normal lives. However, people with epilepsy are at an increased risk of falls, accidents, and injuries. Therefore, it is important to begin treatment right away. CAUSES  Epilepsy has many possible causes. Anything that disturbs the normal pattern of brain cell activity can lead to seizures. This may include:   Head injury.  Birth trauma.  High fever as a child.  Stroke.  Bleeding into or around the brain.  Certain drugs.  Prolonged low oxygen, such as what occurs after CPR efforts.  Abnormal brain development.  Certain illnesses, such as meningitis, encephalitis (brain infection), malaria, and other infections.  An imbalance of nerve signaling chemicals (neurotransmitters).  SIGNS AND SYMPTOMS  The symptoms of a seizure can vary greatly from one person to another. Right before a seizure, you may have a warning (aura) that a seizure is about to occur. An aura may include the following symptoms:  Fear or anxiety.  Nausea.  Feeling like the room is spinning (vertigo).  Vision changes, such as seeing flashing lights or spots. Common symptoms during a seizure include:  Abnormal sensations, such as an abnormal smell or a bitter taste in the mouth.   Sudden, general body stiffness.   Convulsions that involve rhythmic jerking of the face, arm, or leg on one or both sides.   Sudden change in consciousness.   Appearing to be awake but not responding.   Appearing to be asleep but cannot be awakened.   Grimacing, chewing, lip smacking, drooling, tongue biting, or loss of bowel or bladder control. After a seizure,  you may feel sleepy for a while. DIAGNOSIS  Your health care provider will ask about your symptoms and take a medical history. Descriptions from any witnesses to your seizures will be very helpful in the diagnosis. A physical exam, including a detailed neurological exam, is necessary. Various tests may be done, such as:   An electroencephalogram (EEG). This is a painless test of your brain waves. In this test, a diagram is created of your brain waves. These diagrams can be interpreted by a specialist.  An MRI of the brain.   A CT scan of the brain.   A spinal tap (lumbar puncture, LP).  Blood tests to check for signs of infection or abnormal blood chemistry. TREATMENT  There is no cure for epilepsy, but it is generally treatable. Once epilepsy is diagnosed, it is important to begin treatment as soon as possible. For most people with epilepsy, seizures can be controlled with medicines. The following may also be used:  A pacemaker for the brain (vagus nerve stimulator) can be used for people with seizures that are not well controlled by medicine.  Surgery on the brain. For some people, epilepsy eventually goes away. HOME CARE INSTRUCTIONS   Follow your health care provider's recommendations on driving and safety in normal activities.  Get enough rest. Lack of sleep can cause seizures.  Only take over-the-counter or prescription medicines as directed by your health care provider. Take any prescribed medicine exactly as directed.  Avoid any known triggers of your seizures.  Keep a seizure diary. Record what you recall about any seizure, especially any possible trigger.   Make  sure the people you live and work with know that you are prone to seizures. They should receive instructions on how to help you. In general, a witness to a seizure should:   Cushion your head and body.   Turn you on your side.   Avoid unnecessarily restraining you.   Not place anything inside your  mouth.   Call for emergency medical help if there is any question about what has occurred.   Follow up with your health care provider as directed. You may need regular blood tests to monitor the levels of your medicine.  SEEK MEDICAL CARE IF:   You develop signs of infection or other illness. This might increase the risk of a seizure.   You seem to be having more frequent seizures.   Your seizure pattern is changing.  SEEK IMMEDIATE MEDICAL CARE IF:   You have a seizure that does not stop after a few moments.   You have a seizure that causes any difficulty in breathing.   You have a seizure that results in a very severe headache.   You have a seizure that leaves you with the inability to speak or use a part of your body.  Document Released: 02/01/2005 Document Revised: 11/22/2012 Document Reviewed: 09/13/2012 [facility] Patient Information 2015 [place], [place]. This information is not intended to replace advice given to you by your health care provider. Make sure you discuss any questions you have with your health care provider.  Rib Contusion A rib contusion (bruise) can occur by a blow to the chest or by a fall against a hard object. Usually these will be much better in a couple weeks. If X-rays were taken today and there are no broken bones (fractures), the diagnosis of bruising is made. However, broken ribs may not show up for several days, or may be discovered later on a routine X-ray when signs of healing show up. If this happens to you, it does not mean that something was missed on the X-ray, but simply that it did not show up on the first X-rays. Earlier diagnosis will not usually change the treatment. HOME CARE INSTRUCTIONS   Avoid strenuous activity. Be careful during activities and avoid bumping the injured ribs. Activities that pull on the injured ribs and cause pain should be avoided, if possible.  For the first day or two, an ice pack used every 20 minutes while  awake may be helpful. Put ice in a plastic bag and put a towel between the bag and the skin.  Eat a normal, well-balanced diet. Drink plenty of fluids to avoid constipation.  Take deep breaths several times a day to keep lungs free of infection. Try to cough several times a day. Splint the injured area with a pillow while coughing to ease pain. Coughing can help prevent pneumonia.  Wear a rib belt or binder only if told to do so by your caregiver. If you are wearing a rib belt or binder, you must do the breathing exercises as directed by your caregiver. If not used properly, rib belts or binders restrict breathing which can lead to pneumonia.  Only take over-the-counter or prescription medicines for pain, discomfort, or fever as directed by your caregiver. SEEK MEDICAL CARE IF:   You or your child has an oral temperature above 102 F (38.9 C).  Your baby is older than 3 months with a rectal temperature of 100.5 F (38.1 C) or higher for more than 1 day.  You develop a cough,  with thick or bloody sputum. SEEK IMMEDIATE MEDICAL CARE IF:   You have difficulty breathing.  You feel sick to your stomach (nausea), have vomiting or belly (abdominal) pain.  You have worsening pain, not controlled with medications, or there is a change in the location of the pain.  You develop sweating or radiation of the pain into the arms, jaw or shoulders, or become light headed or faint.  You or your child has an oral temperature above 102 F (38.9 C), not controlled by medicine.  Your or your baby is older than 3 months with a rectal temperature of 102 F (38.9 C) or higher.  Your baby is 18 months old or younger with a rectal temperature of 100.4 F (38 C) or higher. MAKE SURE YOU:   Understand these instructions.  Will watch your condition.  Will get help right away if you are not doing well or get worse. Document Released: 10/27/2000 Document Revised: 05/29/2012 Document Reviewed:  09/20/2007 [facility] Patient Information 2015 [place], [place]. This information is not intended to replace advice given to you by your health care provider. Make sure you discuss any questions you have with your health care provider.

## 2014-01-07 ENCOUNTER — Encounter: Payer: Self-pay | Admitting: Internal Medicine

## 2014-01-07 ENCOUNTER — Ambulatory Visit (INDEPENDENT_AMBULATORY_CARE_PROVIDER_SITE_OTHER): Payer: 59 | Admitting: Internal Medicine

## 2014-01-07 VITALS — BP 156/94 | HR 91 | Temp 98.2°F | Ht 69.0 in | Wt 125.8 lb

## 2014-01-07 DIAGNOSIS — Z720 Tobacco use: Secondary | ICD-10-CM

## 2014-01-07 DIAGNOSIS — E782 Mixed hyperlipidemia: Secondary | ICD-10-CM

## 2014-01-07 DIAGNOSIS — F411 Generalized anxiety disorder: Secondary | ICD-10-CM

## 2014-01-07 DIAGNOSIS — G894 Chronic pain syndrome: Secondary | ICD-10-CM

## 2014-01-07 DIAGNOSIS — G40309 Generalized idiopathic epilepsy and epileptic syndromes, not intractable, without status epilepticus: Secondary | ICD-10-CM

## 2014-01-07 DIAGNOSIS — F17200 Nicotine dependence, unspecified, uncomplicated: Secondary | ICD-10-CM

## 2014-01-07 DIAGNOSIS — I10 Essential (primary) hypertension: Secondary | ICD-10-CM

## 2014-01-07 DIAGNOSIS — J449 Chronic obstructive pulmonary disease, unspecified: Secondary | ICD-10-CM

## 2014-01-07 HISTORY — DX: Essential (primary) hypertension: I10

## 2014-01-07 MED ORDER — ALPRAZOLAM 0.5 MG PO TABS: 0.5000 mg | ORAL_TABLET | Freq: Every day | ORAL | Status: DC | PRN

## 2014-01-07 MED ORDER — PHENYTOIN SODIUM EXTENDED 100 MG PO CAPS: 300.0000 mg | ORAL_CAPSULE | Freq: Every day | ORAL | Status: DC

## 2014-01-07 MED ORDER — LISINOPRIL 10 MG PO TABS: 10.0000 mg | ORAL_TABLET | Freq: Every day | ORAL | Status: DC

## 2014-01-07 MED ORDER — CITALOPRAM HYDROBROMIDE 10 MG PO TABS: 10.0000 mg | ORAL_TABLET | Freq: Every day | ORAL | Status: DC

## 2014-01-07 NOTE — Assessment & Plan Note (Signed)
Unable to afford his inhalers Encouraged him to quit smoking He reports that he is unable to quit at this time due his anxiety

## 2014-01-07 NOTE — Progress Notes (Addendum)
HPI Pt presents to the clinic today to establish care. He is transferring care from Dr. Sherral Hammers in [place].  Flu: yearly 2015 Pneumonia Vaccine: 2009 Tetanus: 2010 Zostovax: never PSA Screening: more than 2 years ago Colon Screening: 2010 (10 years) Eye Doctor: as needed Dentist: as needed  COPD: Is not currently on any inhalers because he reports he recently lost his insurance and is not able to afford them. He does continue to smoke.  GERD: Occasional. Triggered by spicy foods and alcohol but he reports he quit drinking 6 months ago. Not currently medicated.  Anxiety: Has been a chronic issue for him. Worse recently since he got laid off from his job of 30 years. Takes xanax twice daily. Is currently out. Was only getting 10 pills per month from Dr. Clydene Laming. Admits to buying it off the street. Was on clonazepam in the past but was not as effective. Denies depression, SI/HI.  HLD: Diet controlled. He has never been medicated for this in the past.  Seizures: Last seizure about 1 month ago. Takes dilantin daily as prescribed.  Chronic back pain: Takes Ibuprofen which helps. He takes percocet as needed. Reports that he does not take it every day. Has not seen ortho or neuro surgeon.   Also, he reports inability to flex his right ankle. He noticed this 1 week ago. He denies any injury to the area. He has not tried anything OTC.  Past Medical History  Diagnosis Date  . COPD (chronic obstructive pulmonary disease)   . GERD (gastroesophageal reflux disease)   . GAD (generalized anxiety disorder)   . Chronic pain syndrome     Left knee  . Tobacco dependence   . History of pneumonia 06/2010  . History of multiple pulmonary nodules 07/2010    Follow up CT scans showed resolution by 03/2011--NO MALIGNANCY.  . DDD (degenerative disc disease), lumbar 11/2008    L5-S1 on plain film  . Osteoarthritis of left knee     + past injury of this knee--tibia fracture?  +left leg shorter than right  .  Seizure disorder     3 total seizures (first was 1995); neuro eval done and recommended lifetime phenytoin (he had a seizure after coming off the med in the late 90s  . Emphysema lung     Current Outpatient Prescriptions  Medication Sig Dispense Refill  . ALPRAZolam (XANAX) 0.5 MG tablet Take 2 tablets (1 mg total) by mouth daily as needed for anxiety or sleep. 10 tablet 0  . aspirin 81 MG tablet Take 81 mg by mouth daily.    Marland Kitchen oxyCODONE-acetaminophen (PERCOCET/ROXICET) 5-325 MG per tablet Take 1 tablet by mouth every 8 (eight) hours as needed. 30 tablet 0  . phenytoin (DILANTIN) 100 MG ER capsule Take 3 capsules (300 mg total) by mouth daily. 90 capsule 0  . Spacer/Aero-Holding Chambers (AEROCHAMBER PLUS FLO-VU) MISC 1 Units by Does not apply route as directed. (Patient not taking: Reported on 01/07/2014) 1 each 0   No current facility-administered medications for this visit.    No Known Allergies  Family History  Problem Relation Age of Onset  . Heart disease Mother   . Heart disease Father   . Cancer Sister   . Stroke Neg Hx     History   Social History  . Marital Status: Married    Spouse Name: N/A    Number of Children: N/A  . Years of Education: N/A   Occupational History  . Not on file.  Social History Main Topics  . Smoking status: Current Every Day Smoker -- 1.00 packs/day for 45 years    Types: Cigarettes  . Smokeless tobacco: Never Used  . Alcohol Use: No  . Drug Use: No  . Sexual Activity:    Partners: Male   Other Topics Concern  . Not on file   Social History Narrative   Married, 2 grown children   Eighth grade education.  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   Originally from [place].   Tobacco 100 pack-yr hx, still smoking as of 10/2011 (1 pack per day).   Denies alcohol or drug use.   No exercise.    ROS:  Constitutional: Denies fever, malaise, fatigue, headache or abrupt weight changes.  Respiratory: Pt reports  shortness of breath with exertion. Denies difficulty breathing, cough or sputum production.   Cardiovascular: Denies chest pain, chest tightness, palpitations or swelling in the hands or feet.  Gastrointestinal: Denies abdominal pain, bloating, constipation, diarrhea or blood in the stool.  Musculoskeletal: Pt reports joint pains. Denies decrease in range of motion, difficulty with gait, muscle pain or joint swelling.  Neurological: Denies dizziness, difficulty with memory, difficulty with speech or problems with balance and coordination.  Psych: Pt reports anxiety. Denies depression, SI/HI.  No other specific complaints in a complete review of systems (except as listed in HPI above).  PE:  BP 156/94 mmHg  Pulse 91  Temp(Src) 98.2 F (36.8 C) (Oral)  Ht 5\' 9"  (1.753 m)  Wt 125 lb 12 oz (57.04 kg)  BMI 18.56 kg/m2  SpO2 98% Wt Readings from Last 3 Encounters:  01/07/14 125 lb 12 oz (57.04 kg)  12/19/13 135 lb (61.236 kg)  04/13/13 143 lb (64.864 kg)    General: Appears his stated age, undernourished in NAD. Cardiovascular: Normal rate and rhythm. S1,S2 noted.  No murmur, rubs or gallops noted.  Pulmonary/Chest: Normal effort and coarse vesicular breath sounds. No respiratory distress. No wheezes, rales or ronchi noted.  Musculoskeletal: Normal passive flexion of the right ankle. Normal extension and rotation. Strength 5/5 BLE. Normal heel to toe walking. Able to walk on tip toes. Unable to walk on right heel. Neurological: Alert and oriented.  Psychiatric: Mood anxious and tearful Behavior is normal. Judgment and thought content normal.    BMET    Component Value Date/Time   NA 137 12/19/2013 0503   K 4.1 12/19/2013 0503   CL 103 12/19/2013 0503   CO2 22 12/19/2013 0503   GLUCOSE 135* 12/19/2013 0503   BUN 7 12/19/2013 0503   CREATININE 0.79 12/19/2013 0503   CREATININE 0.81 11/03/2012 1011   CALCIUM 9.4 12/19/2013 0503   GFRNONAA >90 12/19/2013 0503   GFRAA >90  12/19/2013 0503    Lipid Panel     Component Value Date/Time   CHOL 211* 04/13/2013 1314   TRIG 66 04/13/2013 1314   HDL 86 04/13/2013 1314   CHOLHDL 2.5 04/13/2013 1314   VLDL 13 04/13/2013 1314   LDLCALC 112* 04/13/2013 1314    CBC    Component Value Date/Time   WBC 8.0 12/19/2013 0503   RBC 4.87 12/19/2013 0503   HGB 14.7 12/19/2013 0503   HCT 43.4 12/19/2013 0503   PLT 179 12/19/2013 0503   MCV 89.1 12/19/2013 0503   MCH 30.2 12/19/2013 0503   MCHC 33.9 12/19/2013 0503   RDW 14.5 12/19/2013 0503   LYMPHSABS 1.3 12/19/2013 0503   MONOABS 0.6 12/19/2013 0503   EOSABS 0.6 12/19/2013  0503   BASOSABS 0.0 12/19/2013 0503    Hgb A1C Lab Results  Component Value Date   HGBA1C 5.8* 11/03/2012     Assessment and Plan:  Health Maintenance:  He declines shingles vaccine  He declines PSA screening Encouraged him to visit an eye doctor and dentist at least once yearly  Inability to flex right foot:  Will discuss with Dr. Lorelei Pont and get back with you in 1 month when you follow up

## 2014-01-07 NOTE — Assessment & Plan Note (Signed)
Diet controlled Will hold off lipid profile as he does not currently have insurance

## 2014-01-07 NOTE — Assessment & Plan Note (Signed)
Advised him that I do not treat chronic pain He does not want referral to pain clinic at this time He will stick with ibuprofen for now as this provides him with some relief

## 2014-01-07 NOTE — Progress Notes (Signed)
Pre visit review using our clinic review tool, if applicable. No additional management support is needed unless otherwise documented below in the visit note. 

## 2014-01-07 NOTE — Assessment & Plan Note (Signed)
Encouraged him to quit

## 2014-01-07 NOTE — Patient Instructions (Signed)
Generalized Anxiety Disorder Generalized anxiety disorder (GAD) is a mental disorder. It interferes with life functions, including relationships, work, and school. GAD is different from normal anxiety, which everyone experiences at some point in their lives in response to specific life events and activities. Normal anxiety actually helps us prepare for and get through these life events and activities. Normal anxiety goes away after the event or activity is over.  GAD causes anxiety that is not necessarily related to specific events or activities. It also causes excess anxiety in proportion to specific events or activities. The anxiety associated with GAD is also difficult to control. GAD can vary from mild to severe. People with severe GAD can have intense waves of anxiety with physical symptoms (panic attacks).  SYMPTOMS The anxiety and worry associated with GAD are difficult to control. This anxiety and worry are related to many life events and activities and also occur more days than not for 6 months or longer. People with GAD also have three or more of the following symptoms (one or more in children):  Restlessness.   Fatigue.  Difficulty concentrating.   Irritability.  Muscle tension.  Difficulty sleeping or unsatisfying sleep. DIAGNOSIS GAD is diagnosed through an assessment by your health care provider. Your health care provider will ask you questions aboutyour mood,physical symptoms, and events in your life. Your health care provider may ask you about your medical history and use of alcohol or drugs, including prescription medicines. Your health care provider may also do a physical exam and blood tests. Certain medical conditions and the use of certain substances can cause symptoms similar to those associated with GAD. Your health care provider may refer you to a mental health specialist for further evaluation. TREATMENT The following therapies are usually used to treat GAD:    Medication. Antidepressant medication usually is prescribed for long-term daily control. Antianxiety medicines may be added in severe cases, especially when panic attacks occur.   Talk therapy (psychotherapy). Certain types of talk therapy can be helpful in treating GAD by providing support, education, and guidance. A form of talk therapy called cognitive behavioral therapy can teach you healthy ways to think about and react to daily life events and activities.  Stress managementtechniques. These include yoga, meditation, and exercise and can be very helpful when they are practiced regularly. A mental health specialist can help determine which treatment is best for you. Some people see improvement with one therapy. However, other people require a combination of therapies. Document Released: 05/29/2012 Document Revised: 06/18/2013 Document Reviewed: 05/29/2012 [employer] Patient Information 2015 [employer]. This information is not intended to replace advice given to you by your health care provider. Make sure you discuss any questions you have with your health care provider.  

## 2014-01-07 NOTE — Assessment & Plan Note (Signed)
Currently on dilantin He will need refill of medication today- RX provided Will check CMET at next visit

## 2014-01-07 NOTE — Assessment & Plan Note (Signed)
Will start lisinopril 10 mg daily Will check CMET at next visit  RTC in 1 month to recheck BP/labs

## 2014-01-07 NOTE — Assessment & Plan Note (Signed)
Severe I do not want him taking xanax BID and I also don't want him buying it off the streets Will start celexa- take at night Will give RX for xanax to take one daily Encourage therapy but he declines as he currently does not have insurance

## 2014-01-08 ENCOUNTER — Telehealth: Payer: Self-pay | Admitting: Internal Medicine

## 2014-01-08 NOTE — Telephone Encounter (Signed)
emmi mailed  °

## 2014-02-06 ENCOUNTER — Ambulatory Visit (INDEPENDENT_AMBULATORY_CARE_PROVIDER_SITE_OTHER): Payer: Self-pay | Admitting: Internal Medicine

## 2014-02-06 ENCOUNTER — Encounter: Payer: Self-pay | Admitting: Internal Medicine

## 2014-02-06 VITALS — BP 126/88 | HR 85 | Temp 98.2°F | Wt 128.0 lb

## 2014-02-06 DIAGNOSIS — F411 Generalized anxiety disorder: Secondary | ICD-10-CM

## 2014-02-06 DIAGNOSIS — R569 Unspecified convulsions: Secondary | ICD-10-CM

## 2014-02-06 DIAGNOSIS — I10 Essential (primary) hypertension: Secondary | ICD-10-CM

## 2014-02-06 LAB — COMPREHENSIVE METABOLIC PANEL
ALBUMIN: 3.8 g/dL (ref 3.5–5.2)
ALT: 16 U/L (ref 0–53)
AST: 19 U/L (ref 0–37)
Alkaline Phosphatase: 66 U/L (ref 39–117)
BUN: 8 mg/dL (ref 6–23)
CHLORIDE: 101 meq/L (ref 96–112)
CO2: 32 mEq/L (ref 19–32)
CREATININE: 0.8 mg/dL (ref 0.4–1.5)
Calcium: 9.2 mg/dL (ref 8.4–10.5)
GFR: 110.52 mL/min (ref >60.00)
Glucose, Bld: 87 mg/dL (ref 70–99)
POTASSIUM: 4.5 meq/L (ref 3.5–5.1)
SODIUM: 139 meq/L (ref 135–145)
Total Bilirubin: 0.3 mg/dL (ref 0.2–1.2)
Total Protein: 7 g/dL (ref 6.0–8.3)

## 2014-02-06 MED ORDER — PHENYTOIN SODIUM EXTENDED 100 MG PO CAPS: 300.0000 mg | ORAL_CAPSULE | Freq: Every day | ORAL | Status: DC

## 2014-02-06 MED ORDER — ALPRAZOLAM 0.5 MG PO TABS: 0.5000 mg | ORAL_TABLET | Freq: Every day | ORAL | Status: DC | PRN

## 2014-02-06 MED ORDER — CITALOPRAM HYDROBROMIDE 20 MG PO TABS: 20.0000 mg | ORAL_TABLET | Freq: Every day | ORAL | Status: DC

## 2014-02-06 NOTE — Progress Notes (Signed)
Subjective:    Patient ID: Elijah Mcfarland, male    DOB: [birth year], 63 y.o.   MRN: [id number]  HPI  Pt presents to the clinic today for 1 month follow up of hgih blood pressure. At his last visit, his BP was 156/94. He was started on Lisinopril 10 mg daily. He has been taking the medication as prescribed. He denies side effects. He has not checked his BP since the last OV.  Additionally, he reports less mood swings and panic attacks on the celexa. He is doing well only taking the xanax daily. He does not think the celexa dose is high enough though and would like to try increasing it.  He does need a refill of his dilantin. He has not had any seizures since his last hospital visit.  Review of Systems      Past Medical History  Diagnosis Date  . COPD (chronic obstructive pulmonary disease)   . GERD (gastroesophageal reflux disease)   . GAD (generalized anxiety disorder)   . Chronic pain syndrome     Left knee  . Tobacco dependence   . History of pneumonia 06/2010  . History of multiple pulmonary nodules 07/2010    Follow up CT scans showed resolution by 03/2011--NO MALIGNANCY.  . DDD (degenerative disc disease), lumbar 11/2008    L5-S1 on plain film  . Osteoarthritis of left knee     + past injury of this knee--tibia fracture?  +left leg shorter than right  . Seizure disorder     3 total seizures (first was 1995); neuro eval done and recommended lifetime phenytoin (he had a seizure after coming off the med in the late 90s  . Emphysema lung   . HTN (hypertension) 01/07/2014    Current Outpatient Prescriptions  Medication Sig Dispense Refill  . ALPRAZolam (XANAX) 0.5 MG tablet Take 1 tablet (0.5 mg total) by mouth daily as needed for anxiety. 30 tablet 0  . aspirin 81 MG tablet Take 81 mg by mouth daily.    . citalopram (CELEXA) 10 MG tablet Take 1 tablet (10 mg total) by mouth daily. 30 tablet 2  . lisinopril (PRINIVIL,ZESTRIL) 10 MG tablet Take 1 tablet (10 mg total) by mouth  daily. 30 tablet 2  . oxyCODONE-acetaminophen (PERCOCET/ROXICET) 5-325 MG per tablet Take 1 tablet by mouth every 8 (eight) hours as needed. 30 tablet 0  . phenytoin (DILANTIN) 100 MG ER capsule Take 3 capsules (300 mg total) by mouth daily. 30 capsule 2  . Spacer/Aero-Holding Chambers (AEROCHAMBER PLUS FLO-VU) MISC 1 Units by Does not apply route as directed. (Patient not taking: Reported on 01/07/2014) 1 each 0   No current facility-administered medications for this visit.    No Known Allergies  Family History  Problem Relation Age of Onset  . Heart disease Mother   . Heart disease Father   . Cancer Sister   . Stroke Neg Hx     History   Social History  . Marital Status: Married    Spouse Name: N/A    Number of Children: N/A  . Years of Education: N/A   Occupational History  . Not on file.   Social History Main Topics  . Smoking status: Current Every Day Smoker -- 1.00 packs/day for 45 years    Types: Cigarettes  . Smokeless tobacco: Never Used  . Alcohol Use: No  . Drug Use: No  . Sexual Activity:    Partners: Male   Other Topics Concern  . Not on  file   Social History Narrative   Married, 2 grown children   Eighth grade education.  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   Originally from [place].   Tobacco 100 pack-yr hx, still smoking as of 10/2011 (1 pack per day).   Denies alcohol or drug use.   No exercise.     Constitutional: Denies fever, malaise, fatigue, headache or abrupt weight changes.  Respiratory: Denies difficulty breathing, shortness of breath, cough or sputum production.   Cardiovascular: Denies chest pain, chest tightness, palpitations or swelling in the hands or feet.   Neurological: Denies dizziness, difficulty with memory, difficulty with speech or problems with balance and coordination.  Psych: Pt reports anxiety. Denies depression, SI/HI.  No other specific complaints in a complete review of systems (except as  listed in HPI above).  Objective:   Physical Exam   BP 126/88 mmHg  Pulse 85  Temp(Src) 98.2 F (36.8 C) (Oral)  Wt 128 lb (58.06 kg)  SpO2 96% Wt Readings from Last 3 Encounters:  02/06/14 128 lb (58.06 kg)  01/07/14 125 lb 12 oz (57.04 kg)  12/19/13 135 lb (61.236 kg)    General: Appears his stated age, well developed, well nourished in NAD.  Cardiovascular: Normal rate and rhythm. S1,S2 noted.  No murmur, rubs or gallops noted.  Pulmonary/Chest: Normal effort and positive vesicular breath sounds. No respiratory distress. No wheezes, rales or ronchi noted.  Neurological: Alert and oriented.  Psychiatric: Mood anxious appearing and affect normal. Behavior is normal. Judgment and thought content normal.     BMET    Component Value Date/Time   NA 137 12/19/2013 0503   K 4.1 12/19/2013 0503   CL 103 12/19/2013 0503   CO2 22 12/19/2013 0503   GLUCOSE 135* 12/19/2013 0503   BUN 7 12/19/2013 0503   CREATININE 0.79 12/19/2013 0503   CREATININE 0.81 11/03/2012 1011   CALCIUM 9.4 12/19/2013 0503   GFRNONAA >90 12/19/2013 0503   GFRAA >90 12/19/2013 0503    Lipid Panel     Component Value Date/Time   CHOL 211* 04/13/2013 1314   TRIG 66 04/13/2013 1314   HDL 86 04/13/2013 1314   CHOLHDL 2.5 04/13/2013 1314   VLDL 13 04/13/2013 1314   LDLCALC 112* 04/13/2013 1314    CBC    Component Value Date/Time   WBC 8.0 12/19/2013 0503   RBC 4.87 12/19/2013 0503   HGB 14.7 12/19/2013 0503   HCT 43.4 12/19/2013 0503   PLT 179 12/19/2013 0503   MCV 89.1 12/19/2013 0503   MCH 30.2 12/19/2013 0503   MCHC 33.9 12/19/2013 0503   RDW 14.5 12/19/2013 0503   LYMPHSABS 1.3 12/19/2013 0503   MONOABS 0.6 12/19/2013 0503   EOSABS 0.6 12/19/2013 0503   BASOSABS 0.0 12/19/2013 0503    Hgb A1C Lab Results  Component Value Date   HGBA1C 5.8* 11/03/2012        Assessment & Plan:

## 2014-02-06 NOTE — Assessment & Plan Note (Signed)
Better control on Lisinopril 10 mg daily Will check CMET today

## 2014-02-06 NOTE — Patient Instructions (Addendum)

## 2014-02-06 NOTE — Progress Notes (Signed)
Pre visit review using our clinic review tool, if applicable. No additional management support is needed unless otherwise documented below in the visit note. 

## 2014-02-06 NOTE — Assessment & Plan Note (Signed)
Some improvement on Celexa 10 mg Will increase to 20 mg daily Continue xanax one pill daily- RX provided today

## 2014-02-06 NOTE — Assessment & Plan Note (Signed)
Dilantin refilled today Will check CMET

## 2014-02-07 ENCOUNTER — Telehealth: Payer: Self-pay | Admitting: Internal Medicine

## 2014-02-07 NOTE — Telephone Encounter (Signed)
emmi mailed  °

## 2014-02-20 ENCOUNTER — Telehealth: Payer: Self-pay | Admitting: Internal Medicine

## 2014-02-20 NOTE — Telephone Encounter (Signed)
Per melanie  Put on regina's desk

## 2014-02-20 NOTE — Telephone Encounter (Signed)
Pt spouse dropped off [employer] form to be filled out On melanie's desk

## 2014-03-04 ENCOUNTER — Other Ambulatory Visit: Payer: Self-pay | Admitting: Family Medicine

## 2014-03-04 NOTE — Telephone Encounter (Signed)
Phone in xanax Dilantin already sent to pharmacy

## 2014-03-05 NOTE — Telephone Encounter (Signed)
Rx called into pharmacy as instructed to not be filled until on or after 03/09/2013

## 2014-04-08 ENCOUNTER — Other Ambulatory Visit: Payer: Self-pay | Admitting: Family Medicine

## 2014-04-09 ENCOUNTER — Other Ambulatory Visit: Payer: Self-pay | Admitting: *Deleted

## 2014-04-09 DIAGNOSIS — F411 Generalized anxiety disorder: Secondary | ICD-10-CM

## 2014-04-09 MED ORDER — LISINOPRIL 10 MG PO TABS: 10.0000 mg | ORAL_TABLET | Freq: Every day | ORAL | Status: DC

## 2014-04-09 MED ORDER — CITALOPRAM HYDROBROMIDE 20 MG PO TABS: 20.0000 mg | ORAL_TABLET | Freq: Every day | ORAL | Status: DC

## 2014-04-09 NOTE — Telephone Encounter (Signed)
Pt has f/u in 6/16

## 2014-04-15 ENCOUNTER — Other Ambulatory Visit: Payer: Self-pay

## 2014-04-15 MED ORDER — ALPRAZOLAM 0.5 MG PO TABS: 0.5000 mg | ORAL_TABLET | Freq: Every day | ORAL | Status: DC | PRN

## 2014-04-15 NOTE — Telephone Encounter (Signed)
Rx called in to pharmacy. 

## 2014-04-15 NOTE — Addendum Note (Signed)
Addended by: Lurlean Nanny on: 04/15/2014 10:59 AM   Modules accepted: Orders

## 2014-04-15 NOTE — Telephone Encounter (Signed)
Elijah Mcfarland

## 2014-04-15 NOTE — Telephone Encounter (Signed)
Which medication ?

## 2014-04-15 NOTE — Telephone Encounter (Signed)
Last filled 03/09/14--please advise

## 2014-04-15 NOTE — Telephone Encounter (Signed)
OK to phone in xanax

## 2014-04-18 DIAGNOSIS — Z0289 Encounter for other administrative examinations: Secondary | ICD-10-CM

## 2014-05-02 ENCOUNTER — Ambulatory Visit: Payer: Self-pay | Admitting: Medical Examiner

## 2014-05-10 ENCOUNTER — Other Ambulatory Visit: Payer: Self-pay | Admitting: Internal Medicine

## 2014-06-06 ENCOUNTER — Other Ambulatory Visit: Payer: Self-pay | Admitting: Internal Medicine

## 2014-06-06 NOTE — Telephone Encounter (Signed)
Last filled 04/15/2014--please advise

## 2014-06-06 NOTE — Telephone Encounter (Signed)
Ok to phone in xanax

## 2014-06-07 NOTE — Telephone Encounter (Signed)
Rx called in to pharmacy. 

## 2014-07-10 ENCOUNTER — Other Ambulatory Visit: Payer: Self-pay | Admitting: Internal Medicine

## 2014-07-10 NOTE — Telephone Encounter (Signed)
Called home number--no one answered and no VM picked up--tried mobile number and recording came on saying the number has been changed or disconnected--will try again later

## 2014-07-10 NOTE — Telephone Encounter (Signed)
Ok to phone in xanax after he comes in for UDS

## 2014-07-10 NOTE — Telephone Encounter (Signed)
Last filled 06/06/2014--however he is overdue for his UDS--please advise

## 2014-07-22 ENCOUNTER — Other Ambulatory Visit: Payer: Self-pay | Admitting: Internal Medicine

## 2014-07-23 NOTE — Telephone Encounter (Signed)
Rx called in to pharmacy. 

## 2014-07-24 ENCOUNTER — Other Ambulatory Visit: Payer: Self-pay

## 2014-07-24 MED ORDER — PHENYTOIN SODIUM EXTENDED 100 MG PO CAPS: 300.0000 mg | ORAL_CAPSULE | Freq: Every day | ORAL | Status: DC

## 2014-07-24 NOTE — Telephone Encounter (Signed)
Roselyn Reef at [facility] left v/m requesting refill dilantin.Please advise.last seen 02/06/14 and rx last refilled # 90  X 1 on 05/11/14.

## 2014-08-08 ENCOUNTER — Ambulatory Visit: Payer: Self-pay | Admitting: Internal Medicine

## 2014-10-11 ENCOUNTER — Ambulatory Visit: Payer: Self-pay | Admitting: Internal Medicine

## 2014-11-14 ENCOUNTER — Encounter: Payer: Self-pay | Admitting: Internal Medicine

## 2014-11-14 ENCOUNTER — Ambulatory Visit (INDEPENDENT_AMBULATORY_CARE_PROVIDER_SITE_OTHER): Payer: 59 | Admitting: Internal Medicine

## 2014-11-14 VITALS — BP 144/92 | HR 75 | Temp 98.1°F | Wt 136.0 lb

## 2014-11-14 DIAGNOSIS — J449 Chronic obstructive pulmonary disease, unspecified: Secondary | ICD-10-CM | POA: Diagnosis not present

## 2014-11-14 DIAGNOSIS — Z23 Encounter for immunization: Secondary | ICD-10-CM

## 2014-11-14 DIAGNOSIS — R7309 Other abnormal glucose: Secondary | ICD-10-CM | POA: Diagnosis not present

## 2014-11-14 DIAGNOSIS — F17200 Nicotine dependence, unspecified, uncomplicated: Secondary | ICD-10-CM

## 2014-11-14 DIAGNOSIS — G40309 Generalized idiopathic epilepsy and epileptic syndromes, not intractable, without status epilepticus: Secondary | ICD-10-CM

## 2014-11-14 DIAGNOSIS — Z720 Tobacco use: Secondary | ICD-10-CM

## 2014-11-14 DIAGNOSIS — F411 Generalized anxiety disorder: Secondary | ICD-10-CM

## 2014-11-14 DIAGNOSIS — E782 Mixed hyperlipidemia: Secondary | ICD-10-CM

## 2014-11-14 DIAGNOSIS — R7303 Prediabetes: Secondary | ICD-10-CM

## 2014-11-14 DIAGNOSIS — M1990 Unspecified osteoarthritis, unspecified site: Secondary | ICD-10-CM

## 2014-11-14 DIAGNOSIS — I10 Essential (primary) hypertension: Secondary | ICD-10-CM

## 2014-11-14 LAB — COMPREHENSIVE METABOLIC PANEL
ALK PHOS: 56 U/L (ref 39–117)
ALT: 7 U/L (ref 0–53)
AST: 15 U/L (ref 0–37)
Albumin: 4.1 g/dL (ref 3.5–5.2)
BUN: 5 mg/dL — AB (ref 6–23)
CHLORIDE: 101 meq/L (ref 96–112)
CO2: 35 mEq/L — ABNORMAL HIGH (ref 19–32)
CREATININE: 0.79 mg/dL (ref 0.40–1.50)
Calcium: 9.6 mg/dL (ref 8.4–10.5)
GFR: 105.42 mL/min (ref >60.00)
Glucose, Bld: 97 mg/dL (ref 70–99)
Potassium: 4.7 mEq/L (ref 3.5–5.1)
SODIUM: 140 meq/L (ref 135–145)
TOTAL PROTEIN: 7.4 g/dL (ref 6.0–8.3)
Total Bilirubin: 0.3 mg/dL (ref 0.2–1.2)

## 2014-11-14 LAB — LIPID PANEL
Cholesterol: 229 mg/dL — ABNORMAL HIGH (ref 0–200)
HDL: 60.9 mg/dL (ref >39.00)
LDL Cholesterol: 149 mg/dL — ABNORMAL HIGH (ref 0–99)
NonHDL: 168.18
Total CHOL/HDL Ratio: 4
Triglycerides: 97 mg/dL (ref 0.0–149.0)
VLDL: 19.4 mg/dL (ref 0.0–40.0)

## 2014-11-14 LAB — CBC
HCT: 44.6 % (ref 39.0–52.0)
Hemoglobin: 14.9 g/dL (ref 13.0–17.0)
MCHC: 33.3 g/dL (ref 30.0–36.0)
MCV: 91 fl (ref 78.0–100.0)
Platelets: 202 10*3/uL (ref 150.0–400.0)
RBC: 4.9 Mil/uL (ref 4.22–5.81)
RDW: 14.3 % (ref 11.5–15.5)
WBC: 7.6 10*3/uL (ref 4.0–10.5)

## 2014-11-14 LAB — HEMOGLOBIN A1C: HEMOGLOBIN A1C: 5.6 % (ref 4.6–6.5)

## 2014-11-14 MED ORDER — PHENYTOIN SODIUM EXTENDED 100 MG PO CAPS: 300.0000 mg | ORAL_CAPSULE | Freq: Every day | ORAL | Status: DC

## 2014-11-14 MED ORDER — LISINOPRIL 10 MG PO TABS: 10.0000 mg | ORAL_TABLET | Freq: Every day | ORAL | Status: DC

## 2014-11-14 MED ORDER — FLUTICASONE-SALMETEROL 500-50 MCG/DOSE IN AEPB: 1.0000 | INHALATION_SPRAY | Freq: Two times a day (BID) | RESPIRATORY_TRACT | Status: DC

## 2014-11-14 MED ORDER — ALBUTEROL SULFATE HFA 108 (90 BASE) MCG/ACT IN AERS
1.0000 | INHALATION_SPRAY | Freq: Four times a day (QID) | RESPIRATORY_TRACT | Status: DC | PRN
Start: 2014-11-14 — End: 2016-03-19

## 2014-11-14 MED ORDER — CITALOPRAM HYDROBROMIDE 20 MG PO TABS: 20.0000 mg | ORAL_TABLET | Freq: Every day | ORAL | Status: DC

## 2014-11-14 NOTE — Assessment & Plan Note (Addendum)
We will restart Celexa Support offered today

## 2014-11-14 NOTE — Assessment & Plan Note (Addendum)
Encouraged him to quit- he is ready but reports he has failed Chantix Discussed try Acupuncture or Hypnosis

## 2014-11-14 NOTE — Progress Notes (Signed)
Pre visit review using our clinic review tool, if applicable. No additional management support is needed unless otherwise documented below in the visit note. 

## 2014-11-14 NOTE — Assessment & Plan Note (Signed)
Advised him to take Aleve as needed

## 2014-11-14 NOTE — Progress Notes (Signed)
Subjective:    Patient ID: Elijah Mcfarland, male    DOB: August 18, 1950, 64 y.o.   MRN: [id number]  HPI  Pt presents to the clinic today for follow up of chronic conditions.  Anxiety: He reports he has been out of his Celexa and Xanax for a while. He reports that he occasionally have crying spells and his wife and him "never see eye to eye". He would like to restart the Celexa but does not feel like he needs the Xanax.  Arthritis: Mainly in his back and knees. He does not take anything OTC for this.  COPD: He does have baseline shortness of breath but he reports it is no worse. He has been using an Albuterol inhaler 3-4 times per day with some relief. He was on Advair in the past but has not taken it recently. He does continue to smoke. He has tried to quit in the past by using Chantix but was unsuccessful. Chest xray from 05/2010 reviewed.  Seizure Disorder: No recent seizures on Dilantin. He does not follow with a neurologist.   HTN: He is supposed to be on Lisinopril but he reports he has been out of his medication for the last 3 months. He does have chest tightness 2-3 times per month. This seems to occur when he is stressed. He denies chest pain with exertion. His BP today is 144/92. ECG from 12/2013 reviewed.  HLD: Last LDL 112. He does not take any cholesterol lowering medication. He does try to consume a low fat diet.  Prediabetes: His last A1C was 5.8%. He denies increased thirst, frequent urination, numbness or tingling in his hands or feet or nonhealing wounds.  Review of Systems  Past Medical History  Diagnosis Date  . COPD (chronic obstructive pulmonary disease)   . GERD (gastroesophageal reflux disease)   . GAD (generalized anxiety disorder)   . Chronic pain syndrome     Left knee  . Tobacco dependence   . History of pneumonia 06/2010  . History of multiple pulmonary nodules 07/2010    Follow up CT scans showed resolution by 03/2011--NO MALIGNANCY.  . DDD (degenerative disc  disease), lumbar 11/2008    L5-S1 on plain film  . Osteoarthritis of left knee     + past injury of this knee--tibia fracture?  +left leg shorter than right  . Seizure disorder     3 total seizures (first was 1995); neuro eval done and recommended lifetime phenytoin (he had a seizure after coming off the med in the late 90s  . Emphysema lung   . HTN (hypertension) 01/07/2014    Current Outpatient Prescriptions  Medication Sig Dispense Refill  . ALPRAZolam (XANAX) 0.5 MG tablet TAKE ONE TABLET BY MOUTH DAILY AS NEEDED 30 tablet 0  . aspirin 81 MG tablet Take 81 mg by mouth daily.    . citalopram (CELEXA) 20 MG tablet Take 1 tablet (20 mg total) by mouth daily. 30 tablet 2  . lisinopril (PRINIVIL,ZESTRIL) 10 MG tablet Take 1 tablet (10 mg total) by mouth daily. 30 tablet 5  . oxyCODONE-acetaminophen (PERCOCET/ROXICET) 5-325 MG per tablet Take 1 tablet by mouth every 8 (eight) hours as needed. 30 tablet 0  . phenytoin (DILANTIN) 100 MG ER capsule Take 3 capsules (300 mg total) by mouth daily. 90 capsule 1  . Spacer/Aero-Holding Chambers (AEROCHAMBER PLUS FLO-VU) MISC 1 Units by Does not apply route as directed. 1 each 0   No current facility-administered medications for this visit.  No Known Allergies  Family History  Problem Relation Age of Onset  . Heart disease Mother   . Heart disease Father   . Cancer Sister   . Stroke Neg Hx     Social History   Social History  . Marital Status: Married    Spouse Name: N/A  . Number of Children: N/A  . Years of Education: N/A   Occupational History  . Not on file.   Social History Main Topics  . Smoking status: Current Every Day Smoker -- 1.00 packs/day for 45 years    Types: Cigarettes  . Smokeless tobacco: Never Used  . Alcohol Use: No  . Drug Use: No  . Sexual Activity:    Partners: Male   Other Topics Concern  . Not on file   Social History Narrative   Married, 2 grown children   Eighth grade education.  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   Originally from [place].   Tobacco 100 pack-yr hx, still smoking as of 10/2011 (1 pack per day).   Denies alcohol or drug use.   No exercise.     Constitutional: Denies fever, malaise, fatigue, headache or abrupt weight changes.  HEENT: Denies eye pain, eye redness, ear pain, ringing in the ears, wax buildup, runny nose, nasal congestion, bloody nose, or sore throat. Respiratory: Pt reports mild shortness of breath. Denies difficulty breathing, cough or sputum production.   Cardiovascular: Denies chest pain, chest tightness, palpitations or swelling in the hands or feet.  Gastrointestinal: Denies abdominal pain, bloating, constipation, diarrhea or blood in the stool.  GU: Denies urgency, frequency, pain with urination, burning sensation, blood in urine, odor or discharge. Musculoskeletal: Pt reports joint pain. Denies decrease in range of motion, difficulty with gait, muscle pain or joint swelling.  Skin: Denies redness, rashes, lesions or ulcercations.  Neurological: Denies dizziness, difficulty with memory, difficulty with speech or problems with balance and coordination.  Psych: Pt reports stress. Denies anxiety, depression, SI/HI.  No other specific complaints in a complete review of systems (except as listed in HPI above).     Objective:   Physical Exam   BP 144/92 mmHg  Pulse 75  Temp(Src) 98.1 F (36.7 C) (Oral)  Wt 136 lb (61.689 kg)  SpO2 95% Wt Readings from Last 3 Encounters:  11/14/14 136 lb (61.689 kg)  02/06/14 128 lb (58.06 kg)  01/07/14 125 lb 12 oz (57.04 kg)    General: Appears his stated age, in NAD. Skin: Warm, dry and intact. Skin has a greyish appearance. HEENT: Head: normal shape and size; Eyes: sclera white, no icterus, conjunctiva pink, PERRLA and EOMs intact;  Cardiovascular: Normal rate and rhythm. Distant S1,S2 noted.  No murmur, rubs or gallops noted. No JVD or BLE edema. No carotid bruits  noted. Pulmonary/Chest: Normal effort and bilateral inspiratory and expiratory wheezing. No respiratory distress.  Musculoskeletal: Normal flexion, extension and rotation of the spine. Normal flexion and extension of the knees. Crepitus noted with ROM of the knees. No signs of joint swelling. No difficulty with gait.  Neurological: Alert and oriented.  Psychiatric: Mood and affect normal. Behavior is normal. Judgment and thought content normal.    BMET    Component Value Date/Time   NA 139 02/06/2014 1319   K 4.5 02/06/2014 1319   CL 101 02/06/2014 1319   CO2 32 02/06/2014 1319   GLUCOSE 87 02/06/2014 1319   BUN 8 02/06/2014 1319   CREATININE 0.8 02/06/2014 1319  CREATININE 0.81 11/03/2012 1011   CALCIUM 9.2 02/06/2014 1319   GFRNONAA >90 12/19/2013 0503   GFRAA >90 12/19/2013 0503    Lipid Panel     Component Value Date/Time   CHOL 211* 04/13/2013 1314   TRIG 66 04/13/2013 1314   HDL 86 04/13/2013 1314   CHOLHDL 2.5 04/13/2013 1314   VLDL 13 04/13/2013 1314   LDLCALC 112* 04/13/2013 1314    CBC    Component Value Date/Time   WBC 8.0 12/19/2013 0503   RBC 4.87 12/19/2013 0503   HGB 14.7 12/19/2013 0503   HCT 43.4 12/19/2013 0503   PLT 179 12/19/2013 0503   MCV 89.1 12/19/2013 0503   MCH 30.2 12/19/2013 0503   MCHC 33.9 12/19/2013 0503   RDW 14.5 12/19/2013 0503   LYMPHSABS 1.3 12/19/2013 0503   MONOABS 0.6 12/19/2013 0503   EOSABS 0.6 12/19/2013 0503   BASOSABS 0.0 12/19/2013 0503    Hgb A1C Lab Results  Component Value Date   HGBA1C 5.8* 11/03/2012        Assessment & Plan:

## 2014-11-14 NOTE — Assessment & Plan Note (Signed)
Elevated today Will send RX for Lisinopril to pharmacy CBC and CMET today

## 2014-11-14 NOTE — Assessment & Plan Note (Signed)
Will recheck A1C today

## 2014-11-14 NOTE — Assessment & Plan Note (Signed)
No issues on Dilantin Will continue to monitor

## 2014-11-14 NOTE — Progress Notes (Deleted)
Subjective:    Patient ID: Elijah Mcfarland, male    DOB: March 31, 1950, 64 y.o.   MRN: [id number]  HPI Elijah Mcfarland 64 year old male who presents today for follow up of his chronic medical conditions.    Hypertension- Takes lisinopril however he has been out of his medications for 3 months, so he has not been taking.  He denies chest pain, but has had some tightness.  No SOB or palpitations.    COPD- Uses albuterol 3-4 times a day.  Previously been on Advair.  Current every day smoker, he has tried to quit in the past with Chantix, but has not been successful.    HL- LDL was 112 in Feb.  Patient avoids fried foods and says he follows a fairly healthy diet.    Anxiety- Has not taken citalopram or alprazolam for over 3 months.  He feels like he has a short temper and can have episodes of feeling tearful and depressed.  Denies SI/HI  He would like a flu shot and the Prevnar today.   Review of Systems  Constitutional: Negative for fever, chills and fatigue.  HENT: Negative for congestion, rhinorrhea and sore throat.   Respiratory: Negative for cough, shortness of breath and wheezing.   Cardiovascular: Negative for chest pain, palpitations and leg swelling.  Gastrointestinal: Negative for abdominal pain, diarrhea and constipation.  Genitourinary: Negative for dysuria, frequency and flank pain.  Musculoskeletal: Negative for myalgias, back pain and arthralgias.  Neurological: Negative for dizziness, light-headedness and headaches.  Psychiatric/Behavioral: Negative for agitation. The patient is not nervous/anxious.    Family History  Problem Relation Age of Onset  . Heart disease Mother   . Heart disease Father   . Cancer Sister   . Stroke Neg Hx    Current Outpatient Prescriptions on File Prior to Visit  Medication Sig Dispense Refill  . aspirin 81 MG tablet Take 81 mg by mouth daily.    Marland Kitchen lisinopril (PRINIVIL,ZESTRIL) 10 MG tablet Take 1 tablet (10 mg total) by mouth daily. (Patient not  taking: Reported on 11/14/2014) 30 tablet 5  . phenytoin (DILANTIN) 100 MG ER capsule Take 3 capsules (300 mg total) by mouth daily. (Patient not taking: Reported on 11/14/2014) 90 capsule 1  . Spacer/Aero-Holding Chambers (AEROCHAMBER PLUS FLO-VU) MISC 1 Units by Does not apply route as directed. 1 each 0   No current facility-administered medications on file prior to visit.       Objective:   Physical Exam  Constitutional: He is oriented to person, place, and time. He appears well-developed and well-nourished. No distress.  HENT:  Head: Normocephalic and atraumatic.  Right Ear: External ear normal.  Left Ear: External ear normal.  Eyes: Conjunctivae are normal. Pupils are equal, round, and reactive to light.  Neck: Normal range of motion. Neck supple. No JVD present.  Cardiovascular: Normal rate, regular rhythm and normal heart sounds.   No murmur heard. Pulmonary/Chest: He has wheezes in the right upper field, the right middle field, the right lower field, the left upper field, the left middle field and the left lower field.  Inspiratory and expiratory wheezes.   Abdominal: Soft. Bowel sounds are normal. He exhibits no distension.  Musculoskeletal: Normal range of motion.  Neurological: He is alert and oriented to person, place, and time.  Skin: Skin is warm and dry.  Gray appearance.   Psychiatric: He has a normal mood and affect.          Assessment & Plan:

## 2014-11-14 NOTE — Patient Instructions (Signed)

## 2014-11-14 NOTE — Assessment & Plan Note (Signed)
Encouraged him to consume a low fat diet Will check CMET and Lipid Profile today

## 2014-11-14 NOTE — Assessment & Plan Note (Addendum)
Encouraged him to stop smoking- he is ready but reports he has failed Chantix Will restart his Advair He will continue Albuterol inhaler at this time Flu and Prevnar today

## 2014-11-15 NOTE — Addendum Note (Signed)
Addended by: Lurlean Nanny on: 11/15/2014 03:16 PM   Modules accepted: Orders

## 2014-11-18 ENCOUNTER — Telehealth: Payer: Self-pay | Admitting: Internal Medicine

## 2014-11-18 NOTE — Telephone Encounter (Signed)
Pt called back re: lab results, best number is [phone] / lt

## 2014-11-20 ENCOUNTER — Telehealth: Payer: Self-pay

## 2014-11-20 MED ORDER — ATORVASTATIN CALCIUM 10 MG PO TABS: 10.0000 mg | ORAL_TABLET | Freq: Every day | ORAL | Status: DC

## 2014-11-20 MED ORDER — ALPRAZOLAM 0.5 MG PO TABS: 0.5000 mg | ORAL_TABLET | Freq: Every day | ORAL | Status: DC | PRN

## 2014-11-20 NOTE — Addendum Note (Signed)
Addended by: Lurlean Nanny on: 11/20/2014 12:27 PM   Modules accepted: Orders

## 2014-11-20 NOTE — Telephone Encounter (Signed)
All right. He will need to come in for a UDS at some point. Please phone in Xanax

## 2014-11-20 NOTE — Telephone Encounter (Signed)
Called pt to give lab results---he is requesting refill on Xanax he believes he does need Rx--please advise

## 2014-11-20 NOTE — Addendum Note (Signed)
Addended by: Lurlean Nanny on: 11/20/2014 11:13 AM   Modules accepted: Orders

## 2014-11-22 NOTE — Telephone Encounter (Signed)
Rx called in to pharmacy. 

## 2014-12-12 NOTE — Telephone Encounter (Signed)
Pt states he has a f/u appt next month and will wait until then and is aware that the CSA is needed before he can get another refill

## 2015-01-06 ENCOUNTER — Other Ambulatory Visit (INDEPENDENT_AMBULATORY_CARE_PROVIDER_SITE_OTHER): Payer: 59

## 2015-01-06 ENCOUNTER — Other Ambulatory Visit: Payer: Self-pay | Admitting: Internal Medicine

## 2015-01-06 ENCOUNTER — Encounter: Payer: Self-pay | Admitting: Internal Medicine

## 2015-01-06 DIAGNOSIS — G40309 Generalized idiopathic epilepsy and epileptic syndromes, not intractable, without status epilepticus: Secondary | ICD-10-CM

## 2015-01-06 DIAGNOSIS — E782 Mixed hyperlipidemia: Secondary | ICD-10-CM | POA: Diagnosis not present

## 2015-01-06 LAB — COMPREHENSIVE METABOLIC PANEL
ALT: 10 U/L (ref 0–53)
AST: 16 U/L (ref 0–37)
Albumin: 4.3 g/dL (ref 3.5–5.2)
Alkaline Phosphatase: 54 U/L (ref 39–117)
BUN: 7 mg/dL (ref 6–23)
CALCIUM: 9.7 mg/dL (ref 8.4–10.5)
CHLORIDE: 102 meq/L (ref 96–112)
CO2: 29 mEq/L (ref 19–32)
Creatinine, Ser: 0.74 mg/dL (ref 0.40–1.50)
GFR: 113.63 mL/min (ref >60.00)
Glucose, Bld: 83 mg/dL (ref 70–99)
POTASSIUM: 4.8 meq/L (ref 3.5–5.1)
Sodium: 136 mEq/L (ref 135–145)
Total Bilirubin: 0.3 mg/dL (ref 0.2–1.2)
Total Protein: 7.1 g/dL (ref 6.0–8.3)

## 2015-01-06 LAB — LIPID PANEL
CHOLESTEROL: 188 mg/dL (ref 0–200)
HDL: 57.9 mg/dL (ref >39.00)
LDL Cholesterol: 112 mg/dL — ABNORMAL HIGH (ref 0–99)
NonHDL: 129.78
Total CHOL/HDL Ratio: 3
Triglycerides: 91 mg/dL (ref 0.0–149.0)
VLDL: 18.2 mg/dL (ref 0.0–40.0)

## 2015-01-06 MED ORDER — PHENYTOIN SODIUM EXTENDED 100 MG PO CAPS: 300.0000 mg | ORAL_CAPSULE | Freq: Every day | ORAL | Status: DC

## 2015-01-07 ENCOUNTER — Other Ambulatory Visit: Payer: Self-pay | Admitting: Internal Medicine

## 2015-01-07 NOTE — Telephone Encounter (Signed)
Last filled 11/20/2014--please advise

## 2015-01-07 NOTE — Telephone Encounter (Signed)
Ok to phone in xanax

## 2015-01-08 NOTE — Telephone Encounter (Signed)
Rx called in to pharmacy. 

## 2015-01-22 ENCOUNTER — Encounter: Payer: Self-pay | Admitting: Internal Medicine

## 2015-02-03 ENCOUNTER — Other Ambulatory Visit: Payer: Self-pay | Admitting: Internal Medicine

## 2015-02-03 NOTE — Telephone Encounter (Signed)
Last filled 01/07/2015--please advise if okay to call in 02/06/2015

## 2015-02-03 NOTE — Telephone Encounter (Signed)
Ok to phone in Xanax 

## 2015-02-05 MED ORDER — ALPRAZOLAM 0.5 MG PO TABS: 0.5000 mg | ORAL_TABLET | Freq: Every day | ORAL | Status: DC

## 2015-02-05 NOTE — Telephone Encounter (Signed)
Rx called in to pharmacy. 

## 2015-02-05 NOTE — Addendum Note (Signed)
Addended by: Lurlean Nanny on: 02/05/2015 05:15 PM   Modules accepted: Orders

## 2015-02-18 ENCOUNTER — Other Ambulatory Visit: Payer: Self-pay | Admitting: Internal Medicine

## 2015-03-03 ENCOUNTER — Other Ambulatory Visit: Payer: Self-pay | Admitting: Internal Medicine

## 2015-03-03 NOTE — Telephone Encounter (Signed)
Last filled 02/05/2015--please advise if okay to refill Fri for Sat 03/08/2015

## 2015-03-03 NOTE — Telephone Encounter (Signed)
Ok to phone in Xanax 

## 2015-03-06 ENCOUNTER — Other Ambulatory Visit: Payer: Self-pay | Admitting: *Deleted

## 2015-03-07 MED ORDER — ALPRAZOLAM 0.5 MG PO TABS: 0.5000 mg | ORAL_TABLET | Freq: Every day | ORAL | Status: DC

## 2015-03-07 NOTE — Addendum Note (Signed)
Addended by: Lurlean Nanny on: 03/07/2015 08:18 AM   Modules accepted: Orders

## 2015-04-02 ENCOUNTER — Other Ambulatory Visit: Payer: Self-pay | Admitting: Internal Medicine

## 2015-04-02 NOTE — Telephone Encounter (Signed)
Last filled 03/07/15--please advise

## 2015-04-03 ENCOUNTER — Encounter (INDEPENDENT_AMBULATORY_CARE_PROVIDER_SITE_OTHER): Payer: Self-pay | Admitting: *Deleted

## 2015-04-04 NOTE — Telephone Encounter (Signed)
UDS reviewed- not yet due Ok to phone in Xanax to be filled on or after 04/07/15

## 2015-04-07 NOTE — Telephone Encounter (Signed)
Rx called in to pharmacy. 

## 2015-05-01 ENCOUNTER — Other Ambulatory Visit: Payer: Self-pay | Admitting: Internal Medicine

## 2015-05-01 NOTE — Telephone Encounter (Signed)
Last filled 04/04/2015--please advise

## 2015-05-01 NOTE — Telephone Encounter (Signed)
Ok to phone in Xanax 

## 2015-05-01 NOTE — Telephone Encounter (Signed)
Gave refill to [place] at pharmacy

## 2015-05-15 ENCOUNTER — Encounter: Payer: Self-pay | Admitting: Internal Medicine

## 2015-05-19 ENCOUNTER — Other Ambulatory Visit: Payer: Self-pay | Admitting: Internal Medicine

## 2015-06-02 ENCOUNTER — Other Ambulatory Visit: Payer: Self-pay | Admitting: Internal Medicine

## 2015-06-03 NOTE — Telephone Encounter (Signed)
Ok to phone in Xanax 

## 2015-06-03 NOTE — Telephone Encounter (Signed)
Last filled #30 05/01/15.  Last follow up 11/14/14.  CPE is scheduled for 07/28/15.  Okay to refill?

## 2015-06-03 NOTE — Telephone Encounter (Signed)
CPE scheduled 07/28/15

## 2015-06-04 NOTE — Telephone Encounter (Signed)
Prescription called to requesting pharmacy.

## 2015-06-10 ENCOUNTER — Other Ambulatory Visit: Payer: Self-pay | Admitting: Internal Medicine

## 2015-06-10 NOTE — Telephone Encounter (Signed)
Ok to phone in Xanax 

## 2015-06-10 NOTE — Telephone Encounter (Signed)
Verbal refill given to [place] at pharmacy

## 2015-06-10 NOTE — Telephone Encounter (Signed)
Last filled 03-07-15 #30. Last OV 11-14-14 Next OV 07-28-15

## 2015-07-17 ENCOUNTER — Other Ambulatory Visit: Payer: Self-pay | Admitting: Internal Medicine

## 2015-07-17 LAB — HM DIABETES EYE EXAM

## 2015-07-22 ENCOUNTER — Encounter: Payer: Self-pay | Admitting: Internal Medicine

## 2015-07-25 ENCOUNTER — Encounter: Payer: Self-pay | Admitting: Internal Medicine

## 2015-07-28 ENCOUNTER — Ambulatory Visit (INDEPENDENT_AMBULATORY_CARE_PROVIDER_SITE_OTHER): Payer: Self-pay | Admitting: Internal Medicine

## 2015-07-28 ENCOUNTER — Encounter: Payer: Self-pay | Admitting: Internal Medicine

## 2015-07-28 VITALS — BP 138/90 | HR 76 | Temp 98.1°F | Ht 68.25 in | Wt 123.0 lb

## 2015-07-28 DIAGNOSIS — Z720 Tobacco use: Secondary | ICD-10-CM

## 2015-07-28 DIAGNOSIS — F411 Generalized anxiety disorder: Secondary | ICD-10-CM

## 2015-07-28 DIAGNOSIS — Z0001 Encounter for general adult medical examination with abnormal findings: Secondary | ICD-10-CM

## 2015-07-28 DIAGNOSIS — R634 Abnormal weight loss: Secondary | ICD-10-CM

## 2015-07-28 DIAGNOSIS — F17200 Nicotine dependence, unspecified, uncomplicated: Secondary | ICD-10-CM

## 2015-07-28 DIAGNOSIS — R6889 Other general symptoms and signs: Secondary | ICD-10-CM

## 2015-07-28 MED ORDER — CITALOPRAM HYDROBROMIDE 20 MG PO TABS: ORAL_TABLET | ORAL | Status: DC

## 2015-07-28 MED ORDER — ALPRAZOLAM 0.5 MG PO TABS: 0.5000 mg | ORAL_TABLET | Freq: Two times a day (BID) | ORAL | Status: DC | PRN

## 2015-07-28 NOTE — Patient Instructions (Signed)

## 2015-07-28 NOTE — Progress Notes (Signed)
Pre visit review using our clinic review tool, if applicable. No additional management support is needed unless otherwise documented below in the visit note. 

## 2015-07-28 NOTE — Progress Notes (Signed)
Subjective:    Patient ID: Elijah Mcfarland, male    DOB: 10-14-1950, 65 y.o.   MRN: [id number]  HPI  Pt presents to the clinic today for his annual exam.   Flu: 10/2014 Tetanus: 2010 Pneumovax: 10/2007 Prevnar: 10/2014 Colon Screening: 2010 PSA Screening: 03/2013 Vision Screening: 07/2015, dx with macular degeneration Dentist: no, dentures  Diet: He does eat some meat. He consumes fruits and veggies. He tries to avoid fried foods. He drinks mostly water and coffee. Exercise: None  He does report worsening anxiety due to chronic medical conditions and financial strain. He does not have insurance because he can not afford it. He makes too much money for Medicaid, and has not been disabled long enough to file for Medicaid. He is taking the Celexa daily as prescribed, but feels like he needs to take the Xanax twice daily.  Review of Systems      Past Medical History  Diagnosis Date  . COPD (chronic obstructive pulmonary disease) ([place])   . GERD (gastroesophageal reflux disease)   . GAD (generalized anxiety disorder)   . Chronic pain syndrome     Left knee  . Tobacco dependence   . History of pneumonia 06/2010  . History of multiple pulmonary nodules 07/2010    Follow up CT scans showed resolution by 03/2011--NO MALIGNANCY.  . DDD (degenerative disc disease), lumbar 11/2008    L5-S1 on plain film  . Osteoarthritis of left knee     + past injury of this knee--tibia fracture?  +left leg shorter than right  . Seizure disorder ([place])     3 total seizures (first was 1995); neuro eval done and recommended lifetime phenytoin (he had a seizure after coming off the med in the late 90s  . Emphysema lung ([place])   . HTN (hypertension) 01/07/2014    Current Outpatient Prescriptions  Medication Sig Dispense Refill  . albuterol (PROVENTIL HFA;VENTOLIN HFA) 108 (90 BASE) MCG/ACT inhaler Inhale 1-2 puffs into the lungs every 6 (six) hours as needed for wheezing or shortness of breath. 1 Inhaler 5    . ALPRAZolam (XANAX) 0.5 MG tablet TAKE ONE (1) TABLET EACH DAY 30 tablet 0  . aspirin 81 MG tablet Take 81 mg by mouth daily.    Marland Kitchen atorvastatin (LIPITOR) 10 MG tablet TAKE ONE (1) TABLET EACH DAY 30 tablet 5  . citalopram (CELEXA) 20 MG tablet TAKE ONE (1) TABLET EACH DAY 30 tablet 1  . Fluticasone-Salmeterol (ADVAIR) 500-50 MCG/DOSE AEPB Inhale 1 puff into the lungs 2 (two) times daily. 60 each 2  . lisinopril (PRINIVIL,ZESTRIL) 10 MG tablet TAKE ONE (1) TABLET EACH DAY 30 tablet 0  . phenytoin (DILANTIN) 100 MG ER capsule Take 3 capsules (300 mg total) by mouth daily. 90 capsule 5  . Spacer/Aero-Holding Chambers (AEROCHAMBER PLUS FLO-VU) MISC 1 Units by Does not apply route as directed. 1 each 0   No current facility-administered medications for this visit.    No Known Allergies  Family History  Problem Relation Age of Onset  . Heart disease Mother   . Heart disease Father   . Cancer Sister   . Stroke Neg Hx     Social History   Social History  . Marital Status: Married    Spouse Name: N/A  . Number of Children: N/A  . Years of Education: N/A   Occupational History  . Not on file.   Social History Main Topics  . Smoking status: Current Every Day Smoker -- 1.00 packs/day  for 45 years    Types: Cigarettes  . Smokeless tobacco: Never Used  . Alcohol Use: No  . Drug Use: No  . Sexual Activity:    Partners: Male   Other Topics Concern  . Not on file   Social History Narrative   Married, 2 grown children   Eighth grade education.  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   Originally from [place].   Tobacco 100 pack-yr hx, still smoking as of 10/2011 (1 pack per day).   Denies alcohol or drug use.   No exercise.     Constitutional: Pt reports weight loss. Denies fever, malaise, fatigue, headache.  HEENT: Pt reports blurred vision. Denies eye pain, eye redness, ear pain, ringing in the ears, wax buildup, runny nose, nasal congestion, bloody  nose, or sore throat. Respiratory: Pt reports shortness of breath. Denies difficulty breathing, cough or sputum production.   Cardiovascular: Denies chest pain, chest tightness, palpitations or swelling in the hands or feet.  Gastrointestinal: Pt reports intermittent blood in his stool. Denies abdominal pain, bloating, constipation, diarrhea.  GU: Denies urgency, frequency, pain with urination, burning sensation, blood in urine, odor or discharge. Musculoskeletal: Denies decrease in range of motion, difficulty with gait, muscle pain or joint pain and swelling.  Skin: Denies redness, rashes, lesions or ulcercations.  Neurological: Denies dizziness, difficulty with memory, difficulty with speech or problems with balance and coordination.  Psych: Pt reports anxiety and depression. Denies SI/HI.  No other specific complaints in a complete review of systems (except as listed in HPI above).  Objective:   Physical Exam  BP 138/90 mmHg  Pulse 76  Temp(Src) 98.1 F (36.7 C) (Oral)  Ht 5' 8.25" (1.734 m)  Wt 123 lb (55.792 kg)  BMI 18.56 kg/m2  SpO2 98% Wt Readings from Last 3 Encounters:  07/28/15 123 lb (55.792 kg)  11/14/14 136 lb (61.689 kg)  02/06/14 128 lb (58.06 kg)    General: Appears his stated age, undernourished, in NAD. Skin: Warm, dry and intact.  HEENT: Head: normal shape and size; Eyes: sclera white, no icterus, conjunctiva pink, PERRLA and EOMs intact; Ears: bilateral cerumen impaction; Throat/Mouth: Teeth present, mucosa pink and moist, no exudate, lesions or ulcerations noted.  Neck:  Neck supple, trachea midline. No masses, lumps or thyromegaly present.  Cardiovascular: Normal rate and rhythm. S1,S2 noted.  No murmur, rubs or gallops noted. No JVD or BLE edema. No carotid bruits noted. Pulmonary/Chest: Normal effort and positive vesicular breath sounds. No respiratory distress. Scattered wheezes throughout.  Abdomen: Soft and nontender. Normal bowel sounds. No distention  or masses noted. Liver, spleen and kidneys non palpable. Rectal: External hemorrhoid noted. Prostate enlarged. Musculoskeletal: Strength 5/5 BUE/BLE. No signs of joint swelling. No difficulty with gait.  Neurological: Alert and oriented. Cranial nerves II-XII grossly intact. Coordination normal.  Psychiatric: Mood and affect normal. Behavior is normal. Judgment and thought content normal.     BMET    Component Value Date/Time   NA 136 01/06/2015 1052   K 4.8 01/06/2015 1052   CL 102 01/06/2015 1052   CO2 29 01/06/2015 1052   GLUCOSE 83 01/06/2015 1052   BUN 7 01/06/2015 1052   CREATININE 0.74 01/06/2015 1052   CREATININE 0.81 11/03/2012 1011   CALCIUM 9.7 01/06/2015 1052   GFRNONAA >90 12/19/2013 0503   GFRAA >90 12/19/2013 0503    Lipid Panel     Component Value Date/Time   CHOL 188 01/06/2015 1052   TRIG 91.0 01/06/2015  1052   HDL 57.90 01/06/2015 1052   CHOLHDL 3 01/06/2015 1052   VLDL 18.2 01/06/2015 1052   LDLCALC 112* 01/06/2015 1052    CBC    Component Value Date/Time   WBC 7.6 11/14/2014 0916   RBC 4.90 11/14/2014 0916   HGB 14.9 11/14/2014 0916   HCT 44.6 11/14/2014 0916   PLT 202.0 11/14/2014 0916   MCV 91.0 11/14/2014 0916   MCH 30.2 12/19/2013 0503   MCHC 33.3 11/14/2014 0916   RDW 14.3 11/14/2014 0916   LYMPHSABS 1.3 12/19/2013 0503   MONOABS 0.6 12/19/2013 0503   EOSABS 0.6 12/19/2013 0503   BASOSABS 0.0 12/19/2013 0503    Hgb A1C Lab Results  Component Value Date   HGBA1C 5.6 11/14/2014         Assessment & Plan:   Preventative Health Maintenance:  Flu, Tetanus, Pneumovax and Prevnar UTD Colonoscopy UTD Encouraged him to continue to see an eye doctor annually Encouraged him to consume a balanced diet and start an exercise regimen Will defer labs this time due to lack of insurance  Weight loss:  He reports due to lack of appetite Discussed the importance of smoking cessation He declines screening CT scan for lung cancer  screening He declines appetite supplement at this time  RTC in 1 year to follow up annual exam

## 2015-07-28 NOTE — Assessment & Plan Note (Signed)
Continue Celexa Increase Xanax to 0.5 mg BID, # 60, 0 refills

## 2015-07-29 ENCOUNTER — Other Ambulatory Visit: Payer: Self-pay | Admitting: Internal Medicine

## 2015-07-31 ENCOUNTER — Telehealth: Payer: Self-pay

## 2015-07-31 NOTE — Telephone Encounter (Signed)
Spoke with Roselyn Reef and she stated #30 was called into pharmacy. I advised her that it had been changed to BID #60 per [facility], she stated she will get that changed

## 2015-07-31 NOTE — Telephone Encounter (Signed)
Pt left vm; pt was seen 07/28/15 and xanax was going to be increased from 1 daily to 2 daily and when pt picked up med instructions were still once daily. Did new rx on 07/28/15 get called in? Pt request cb.

## 2015-08-14 ENCOUNTER — Other Ambulatory Visit: Payer: Self-pay | Admitting: Internal Medicine

## 2015-08-15 NOTE — Telephone Encounter (Signed)
Please advise on Dilantin

## 2015-08-15 NOTE — Telephone Encounter (Signed)
As far as I know, he should still be taking Dilantin. Sent electronically

## 2015-08-29 ENCOUNTER — Other Ambulatory Visit: Payer: Self-pay

## 2015-08-29 MED ORDER — ALPRAZOLAM 0.5 MG PO TABS: 0.5000 mg | ORAL_TABLET | Freq: Two times a day (BID) | ORAL | Status: DC | PRN

## 2015-08-29 NOTE — Telephone Encounter (Signed)
Last filled 07/28/15--please advise

## 2015-08-29 NOTE — Telephone Encounter (Signed)
Ok to phone in Xanax 

## 2015-09-01 ENCOUNTER — Other Ambulatory Visit: Payer: Self-pay | Admitting: Internal Medicine

## 2015-09-01 NOTE — Telephone Encounter (Signed)
Rx called in to pharmacy. 

## 2015-09-01 NOTE — Telephone Encounter (Signed)
Pt left v/m requesting cb with status of xanax refill.

## 2015-10-02 ENCOUNTER — Other Ambulatory Visit: Payer: Self-pay

## 2015-10-02 NOTE — Telephone Encounter (Signed)
Pt left v/m requesting xanax refill to [facility]; pt request cb when refilled. Last refilled # 60 on 08/29/15 and last seen 07/28/15. Webb Silversmith NP out of office.Please advise.

## 2015-10-03 MED ORDER — ALPRAZOLAM 0.5 MG PO TABS: 0.5000 mg | ORAL_TABLET | Freq: Two times a day (BID) | ORAL | 0 refills | Status: DC | PRN

## 2015-10-03 NOTE — Telephone Encounter (Signed)
[facility]ed [facility].

## 2015-10-03 NOTE — Telephone Encounter (Signed)
Approved: #60 x 0 

## 2015-10-13 ENCOUNTER — Other Ambulatory Visit: Payer: Self-pay | Admitting: Internal Medicine

## 2015-11-03 ENCOUNTER — Other Ambulatory Visit: Payer: Self-pay

## 2015-11-03 MED ORDER — ALPRAZOLAM 0.5 MG PO TABS: 0.5000 mg | ORAL_TABLET | Freq: Two times a day (BID) | ORAL | 0 refills | Status: DC | PRN

## 2015-11-03 NOTE — Telephone Encounter (Signed)
Pt left v/m requesting refill [facility]anax to [facility]. Last refilled # 60 on 10/03/15. Last annual exam on 07/28/15.

## 2015-11-03 NOTE — Telephone Encounter (Signed)
Ok to phone in Xanax 

## 2015-11-04 NOTE — Telephone Encounter (Signed)
Rx called in to pharmacy. 

## 2015-12-03 ENCOUNTER — Other Ambulatory Visit: Payer: Self-pay

## 2015-12-03 MED ORDER — ALPRAZOLAM 0.5 MG PO TABS: 0.5000 mg | ORAL_TABLET | Freq: Two times a day (BID) | ORAL | 0 refills | Status: DC | PRN

## 2015-12-03 NOTE — Telephone Encounter (Signed)
Rx called in to pharmacy. 

## 2015-12-03 NOTE — Telephone Encounter (Signed)
Last filled 11/03/15--please advise

## 2015-12-03 NOTE — Telephone Encounter (Signed)
Ok to phone in Xanax 

## 2015-12-08 ENCOUNTER — Other Ambulatory Visit: Payer: Self-pay | Admitting: Internal Medicine

## 2015-12-08 NOTE — Telephone Encounter (Signed)
Last filled 10/14/15 #30 not #90---so I asked pharmacy to cancel the #30 Rx and replace with #90--please advise

## 2016-01-05 ENCOUNTER — Other Ambulatory Visit: Payer: Self-pay

## 2016-01-05 MED ORDER — ALPRAZOLAM 0.5 MG PO TABS: 0.5000 mg | ORAL_TABLET | Freq: Two times a day (BID) | ORAL | 0 refills | Status: DC | PRN

## 2016-01-05 NOTE — Telephone Encounter (Signed)
Ok to phone in Xanax 

## 2016-01-05 NOTE — Telephone Encounter (Signed)
Pt left v/m requesting refill xanax to [facility]. Last seen annual exam on 07/28/15. Last refilled # 60 on 12/03/15.

## 2016-01-05 NOTE — Telephone Encounter (Signed)
Rx called in to pharmacy. 

## 2016-01-19 DIAGNOSIS — H35433 Paving stone degeneration of retina, bilateral: Secondary | ICD-10-CM | POA: Diagnosis not present

## 2016-01-19 DIAGNOSIS — H353231 Exudative age-related macular degeneration, bilateral, with active choroidal neovascularization: Secondary | ICD-10-CM | POA: Diagnosis not present

## 2016-01-19 DIAGNOSIS — H43813 Vitreous degeneration, bilateral: Secondary | ICD-10-CM | POA: Diagnosis not present

## 2016-01-26 ENCOUNTER — Ambulatory Visit: Payer: Self-pay | Admitting: Internal Medicine

## 2016-02-02 ENCOUNTER — Other Ambulatory Visit: Payer: Self-pay | Admitting: Internal Medicine

## 2016-02-02 NOTE — Telephone Encounter (Signed)
Last filled 01/05/16--please advise

## 2016-02-03 MED ORDER — ALPRAZOLAM 0.5 MG PO TABS: 0.5000 mg | ORAL_TABLET | Freq: Every day | ORAL | 0 refills | Status: DC | PRN

## 2016-02-03 NOTE — Telephone Encounter (Signed)
Ok to phone in Xanax 

## 2016-02-03 NOTE — Addendum Note (Signed)
Addended by: Lurlean Nanny on: 02/03/2016 08:49 AM   Modules accepted: Orders

## 2016-02-04 MED ORDER — ALPRAZOLAM 0.5 MG PO TABS: 0.5000 mg | ORAL_TABLET | Freq: Two times a day (BID) | ORAL | 0 refills | Status: DC

## 2016-02-04 NOTE — Telephone Encounter (Signed)
Rx called into pharmacy Xanax 1 tab BID #60 as noted from 07/2015 OV where Regina increased from QD

## 2016-02-04 NOTE — Addendum Note (Signed)
Addended by: Lurlean Nanny on: 02/04/2016 12:01 PM   Modules accepted: Orders

## 2016-02-19 ENCOUNTER — Ambulatory Visit (INDEPENDENT_AMBULATORY_CARE_PROVIDER_SITE_OTHER): Payer: Medicare HMO | Admitting: Internal Medicine

## 2016-02-19 ENCOUNTER — Encounter: Payer: Self-pay | Admitting: Internal Medicine

## 2016-02-19 VITALS — BP 136/80 | HR 82 | Temp 98.8°F | Wt 132.0 lb

## 2016-02-19 DIAGNOSIS — J441 Chronic obstructive pulmonary disease with (acute) exacerbation: Secondary | ICD-10-CM

## 2016-02-19 DIAGNOSIS — G40309 Generalized idiopathic epilepsy and epileptic syndromes, not intractable, without status epilepticus: Secondary | ICD-10-CM

## 2016-02-19 DIAGNOSIS — Z23 Encounter for immunization: Secondary | ICD-10-CM | POA: Diagnosis not present

## 2016-02-19 DIAGNOSIS — J449 Chronic obstructive pulmonary disease, unspecified: Secondary | ICD-10-CM

## 2016-02-19 DIAGNOSIS — I10 Essential (primary) hypertension: Secondary | ICD-10-CM

## 2016-02-19 DIAGNOSIS — Z114 Encounter for screening for human immunodeficiency virus [HIV]: Secondary | ICD-10-CM

## 2016-02-19 DIAGNOSIS — F411 Generalized anxiety disorder: Secondary | ICD-10-CM

## 2016-02-19 DIAGNOSIS — E7800 Pure hypercholesterolemia, unspecified: Secondary | ICD-10-CM

## 2016-02-19 DIAGNOSIS — M1990 Unspecified osteoarthritis, unspecified site: Secondary | ICD-10-CM

## 2016-02-19 DIAGNOSIS — R7303 Prediabetes: Secondary | ICD-10-CM

## 2016-02-19 LAB — COMPREHENSIVE METABOLIC PANEL
ALT: 14 U/L (ref 0–53)
AST: 16 U/L (ref 0–37)
Albumin: 4.3 g/dL (ref 3.5–5.2)
Alkaline Phosphatase: 69 U/L (ref 39–117)
BILIRUBIN TOTAL: 0.4 mg/dL (ref 0.2–1.2)
BUN: 9 mg/dL (ref 6–23)
CHLORIDE: 103 meq/L (ref 96–112)
CO2: 30 meq/L (ref 19–32)
CREATININE: 0.76 mg/dL (ref 0.40–1.50)
Calcium: 9.1 mg/dL (ref 8.4–10.5)
GFR: 109.79 mL/min (ref >60.00)
Glucose, Bld: 90 mg/dL (ref 70–99)
Potassium: 4.8 mEq/L (ref 3.5–5.1)
SODIUM: 137 meq/L (ref 135–145)
Total Protein: 7.1 g/dL (ref 6.0–8.3)

## 2016-02-19 LAB — LIPID PANEL
CHOL/HDL RATIO: 3
Cholesterol: 201 mg/dL — ABNORMAL HIGH (ref 0–200)
HDL: 63.5 mg/dL (ref >39.00)
LDL CALC: 115 mg/dL — AB (ref 0–99)
NonHDL: 137.77
Triglycerides: 114 mg/dL (ref 0.0–149.0)
VLDL: 22.8 mg/dL (ref 0.0–40.0)

## 2016-02-19 LAB — HEMOGLOBIN A1C: HEMOGLOBIN A1C: 5.7 % (ref 4.6–6.5)

## 2016-02-19 MED ORDER — LOSARTAN POTASSIUM 50 MG PO TABS: 50.0000 mg | ORAL_TABLET | Freq: Every day | ORAL | 0 refills | Status: DC

## 2016-02-19 MED ORDER — PREDNISONE 10 MG PO TABS: ORAL_TABLET | ORAL | 0 refills | Status: DC

## 2016-02-19 MED ORDER — CITALOPRAM HYDROBROMIDE 40 MG PO TABS: 40.0000 mg | ORAL_TABLET | Freq: Every day | ORAL | 3 refills | Status: DC

## 2016-02-19 MED ORDER — FLUTICASONE-SALMETEROL 500-50 MCG/DOSE IN AEPB: 1.0000 | INHALATION_SPRAY | Freq: Two times a day (BID) | RESPIRATORY_TRACT | 2 refills | Status: DC

## 2016-02-19 MED ORDER — MELOXICAM 15 MG PO TABS: 15.0000 mg | ORAL_TABLET | Freq: Every day | ORAL | 0 refills | Status: DC

## 2016-02-19 NOTE — Assessment & Plan Note (Signed)
-   Repeat A1C today

## 2016-02-19 NOTE — Assessment & Plan Note (Signed)
?   Lisinopril is contributing to cough  BP uncontrolled any way D/c Lisinopril  eRx for Losartan 50 mg daily CME Today

## 2016-02-19 NOTE — Assessment & Plan Note (Signed)
Deteriorated Increase Celexa to 40 mg daily Discussed cutting back on the twice daily Xanax- he will try

## 2016-02-19 NOTE — Patient Instructions (Signed)
Hypertension Hypertension is another name for high blood pressure. High blood pressure forces your heart to work harder to pump blood. A blood pressure reading has two numbers, which includes a higher number over a lower number (example: 110/72). Follow these instructions at home:  Have your blood pressure rechecked by your doctor.  Only take medicine as told by your doctor. Follow the directions carefully. The medicine does not work as well if you skip doses. Skipping doses also puts you at risk for problems.  Do not smoke.  Monitor your blood pressure at home as told by your doctor. Contact a doctor if:  You think you are having a reaction to the medicine you are taking.  You have repeat headaches or feel dizzy.  You have puffiness (swelling) in your ankles.  You have trouble with your vision. Get help right away if:  You get a very bad headache and are confused.  You feel weak, numb, or faint.  You get chest or belly (abdominal) pain.  You throw up (vomit).  You cannot breathe very well. This information is not intended to replace advice given to you by your health care provider. Make sure you discuss any questions you have with your health care provider. Document Released: 07/21/2007 Document Revised: 07/10/2015 Document Reviewed: 11/24/2012 Elsevier Interactive Patient Education  2017 Elsevier Inc.  

## 2016-02-19 NOTE — Assessment & Plan Note (Signed)
Continue Dilantin He is not interested in weaning off this now CMET today

## 2016-02-19 NOTE — Assessment & Plan Note (Signed)
Uncontrolled He continues to smoke and is not interested in quitting at this time He declines low dose lung cancer screening at this time Refilled Advair, see if your new insurance will pay for this, if not let me know Continue Albuterol as needed

## 2016-02-19 NOTE — Assessment & Plan Note (Signed)
CMET and Lipid profile today Continue Lipitor for now, will adjust if needed based on labs Encouraged him to consume a low fat diet

## 2016-02-19 NOTE — Assessment & Plan Note (Signed)
Deteriorated Will start Meloxicam, eRx sent to pharmacy Advised him no to take Ibuprofen or Aleve OTC, also not to take while on Prednisone

## 2016-02-19 NOTE — Progress Notes (Signed)
Subjective:    Patient ID: Elijah Mcfarland, male    DOB: Nov 05, 1950, 66 y.o.   MRN: [id number]  HPI  Pt presents to the clinic today for 6 month follow up of chronic conditions.  Macular Degeneration: he reports he is getting shots in his eyes monthly. He follows with opthalmology.  Anxiety: Triggered by a difficult relationship with his wife. He is taking Celexa as prescribed but reports he does not feel like it works as well as it used to. He has Xanax and reports he takes it 2 x day.  Arthritis: Mainly in his back and knees. He is not taking anything OTC for this. He does not follow with orthopedics for this.  COPD: He has chronic shortness of breath. He is prescribed Advair but does not take it secondary to cost. He uses Albuterol as needed. He is currently still smoking. He has tried to quit smoking in the past with Chantix, but was unsuccessful. CT chest from 03/2011 reviewed.  Seizure Disorder: No recent seizures on Dilantin. He does not follow with neurology.   HTN: He is taking Lisinopril as prescribed. He denies adverse side effects. His BP today is 136/80. ECG from 12/2013 reviewed.  HLD: His last LDL was 112, 12/2014 . He is taking Lipitor as prescribed. He denies myalgias. He does consume a low fat diet.  Prediabetes: His last A1C was 5.6%, 10/2014. He is not taking any diabetes medication at this time. He denies increase thirst, frequent urination, numbness or tingling in his hands or feet or non healing wounds.  He does c/o a cough. This started 2-3 weeks ago. The cough is nonproductive. He denies runny nose, nasal congestion or sore throat. He denies fever, chills or body aches. He reports he is prescribed Advair but can not afford it, so he has been using someone else's inhaler.  Review of Systems  Past Medical History:  Diagnosis Date  . Chronic pain syndrome    Left knee  . COPD (chronic obstructive pulmonary disease) ([place])   . DDD (degenerative disc disease),  lumbar 11/2008   L5-S1 on plain film  . Emphysema lung ([place])   . GAD (generalized anxiety disorder)   . GERD (gastroesophageal reflux disease)   . History of multiple pulmonary nodules 07/2010   Follow up CT scans showed resolution by 03/2011--NO MALIGNANCY.  Marland Kitchen History of pneumonia 06/2010  . HTN (hypertension) 01/07/2014  . Osteoarthritis of left knee    + past injury of this knee--tibia fracture?  +left leg shorter than right  . Seizure disorder ([place])    3 total seizures (first was 1995); neuro eval done and recommended lifetime phenytoin (he had a seizure after coming off the med in the late 90s  . Tobacco dependence     Current Outpatient Prescriptions  Medication Sig Dispense Refill  . albuterol (PROVENTIL HFA;VENTOLIN HFA) 108 (90 BASE) MCG/ACT inhaler Inhale 1-2 puffs into the lungs every 6 (six) hours as needed for wheezing or shortness of breath. 1 Inhaler 5  . ALPRAZolam (XANAX) 0.5 MG tablet Take 1 tablet (0.5 mg total) by mouth 2 (two) times daily. 60 tablet 0  . aspirin 81 MG tablet Take 81 mg by mouth daily.    Marland Kitchen atorvastatin (LIPITOR) 10 MG tablet TAKE ONE (1) TABLET EACH DAY 30 tablet 10  . citalopram (CELEXA) 20 MG tablet TAKE ONE (1) TABLET EACH DAY 30 tablet 11  . Fluticasone-Salmeterol (ADVAIR) 500-50 MCG/DOSE AEPB Inhale 1 puff into the lungs 2 (two)  times daily. 60 each 2  . lisinopril (PRINIVIL,ZESTRIL) 10 MG tablet TAKE ONE (1) TABLET EACH DAY 30 tablet 5  . phenytoin (DILANTIN) 100 MG ER capsule Take 3 capsules (300 mg total) by mouth daily. 90 capsule 5  . phenytoin (DILANTIN) 100 MG ER capsule TAKE THREE CAPSULES BY MOUTH DAILY 90 capsule 1  . Spacer/Aero-Holding Chambers (AEROCHAMBER PLUS FLO-VU) MISC 1 Units by Does not apply route as directed. 1 each 0   No current facility-administered medications for this visit.     No Known Allergies  Family History  Problem Relation Age of Onset  . Heart disease Mother   . Heart disease Father   . Cancer Sister     . Stroke Neg Hx     Social History   Social History  . Marital status: Married    Spouse name: N/A  . Number of children: N/A  . Years of education: N/A   Occupational History  . Not on file.   Social History Main Topics  . Smoking status: Current Every Day Smoker    Packs/day: 1.00    Years: 45.00    Types: Cigarettes  . Smokeless tobacco: Never Used  . Alcohol use No  . Drug use: No  . Sexual activity: Yes    Partners: Male   Other Topics Concern  . Not on file   Social History Narrative   Married, 2 grown children   Eighth grade education.  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   Originally from [place].   Tobacco 100 pack-yr hx, still smoking as of 10/2011 (1 pack per day).   Denies alcohol or drug use.   No exercise.     Constitutional: Denies fever, malaise, fatigue, headache or abrupt weight changes.  HEENT: Denies eye pain, eye redness, ear pain, ringing in the ears, wax buildup, runny nose, nasal congestion, bloody nose, or sore throat. Respiratory: Pt reports cough and shortness of breath. Denies difficulty breathing, or sputum production.   Cardiovascular: Denies chest pain, chest tightness, palpitations or swelling in the hands or feet.  Gastrointestinal: Denies abdominal pain, bloating, constipation, diarrhea or blood in the stool.  GU: Denies urgency, frequency, pain with urination, burning sensation, blood in urine, odor or discharge. Musculoskeletal: Pt reports arthritis in his knees and his back. Denies decrease in range of motion, difficulty with gait, muscle pain.  Skin: Denies redness, rashes, lesions or ulcercations.  Neurological: Denies dizziness, difficulty with memory, difficulty with speech or problems with balance and coordination.  Psych: pt reports anxiety. Denies depression, SI/HI.  No other specific complaints in a complete review of systems (except as listed in HPI above).     Objective:   Physical  Exam  BP 136/80   Pulse 82   Temp 98.8 F (37.1 C) (Oral)   Wt 132 lb (59.9 kg)   SpO2 98%   BMI 19.92 kg/m  Wt Readings from Last 3 Encounters:  02/19/16 132 lb (59.9 kg)  07/28/15 123 lb (55.8 kg)  11/14/14 136 lb (61.7 kg)    General: Appears his stated age, in NAD. HEENT: Head: normal shape and size; Eyes: sclera white, no icterus, conjunctiva pink, PERRLA and EOMs intact; Cardiovascular: Normal rate and rhythm. S1,S2 noted.  No murmur, rubs or gallops noted. No JVD or BLE edema. No carotid bruits noted. Pulmonary/Chest: Normal effort and positive vesicular breath sounds. No respiratory distress. No wheezes, rales or ronchi noted.  Musculoskeletal: Normal flexion, extension and rotation of the  spine. Bony tenderness noted over the lumbar spine. No difficulty with gait.  Neurological: Alert and oriented.   Psychiatric: He is anxious appearing today.  BMET    Component Value Date/Time   NA 136 01/06/2015 1052   K 4.8 01/06/2015 1052   CL 102 01/06/2015 1052   CO2 29 01/06/2015 1052   GLUCOSE 83 01/06/2015 1052   BUN 7 01/06/2015 1052   CREATININE 0.74 01/06/2015 1052   CREATININE 0.81 11/03/2012 1011   CALCIUM 9.7 01/06/2015 1052   GFRNONAA >90 12/19/2013 0503   GFRAA >90 12/19/2013 0503    Lipid Panel     Component Value Date/Time   CHOL 188 01/06/2015 1052   TRIG 91.0 01/06/2015 1052   HDL 57.90 01/06/2015 1052   CHOLHDL 3 01/06/2015 1052   VLDL 18.2 01/06/2015 1052   LDLCALC 112 (H) 01/06/2015 1052    CBC    Component Value Date/Time   WBC 7.6 11/14/2014 0916   RBC 4.90 11/14/2014 0916   HGB 14.9 11/14/2014 0916   HCT 44.6 11/14/2014 0916   PLT 202.0 11/14/2014 0916   MCV 91.0 11/14/2014 0916   MCH 30.2 12/19/2013 0503   MCHC 33.3 11/14/2014 0916   RDW 14.3 11/14/2014 0916   LYMPHSABS 1.3 12/19/2013 0503   MONOABS 0.6 12/19/2013 0503   EOSABS 0.6 12/19/2013 0503   BASOSABS 0.0 12/19/2013 0503    Hgb A1C Lab Results  Component Value Date    HGBA1C 5.6 11/14/2014         Assessment & Plan:   COPD exacerbation:  eRx for Pred Taper x 6 days No indication for abx at this time He has new insurance, he will let me know if covers Advair, refilled today If not, he will call and let me know  RTC in 3 weeks for follow up of HTN Quyen Cutsforth, NP

## 2016-02-20 LAB — HIV ANTIBODY (ROUTINE TESTING W REFLEX): HIV: NONREACTIVE

## 2016-02-27 ENCOUNTER — Other Ambulatory Visit: Payer: Self-pay | Admitting: Internal Medicine

## 2016-02-27 NOTE — Telephone Encounter (Signed)
Last filled 12/08/15 with 1 refill--please advise

## 2016-03-01 NOTE — Addendum Note (Signed)
Addended by: Lurlean Nanny on: 03/01/2016 05:30 PM   Modules accepted: Orders

## 2016-03-02 NOTE — Addendum Note (Signed)
Addended by: Lurlean Nanny on: 03/02/2016 09:26 AM   Modules accepted: Orders

## 2016-03-03 ENCOUNTER — Telehealth: Payer: Self-pay | Admitting: Internal Medicine

## 2016-03-05 NOTE — Telephone Encounter (Signed)
Ok to phone in Xanax 

## 2016-03-05 NOTE — Telephone Encounter (Signed)
Rx called in to pharmacy. 

## 2016-03-05 NOTE — Telephone Encounter (Signed)
Last filled 02/04/16--please advise

## 2016-03-08 NOTE — Telephone Encounter (Signed)
noted 

## 2016-03-08 NOTE — Telephone Encounter (Signed)
Pt requesting # 60 for xanax refill; pt said last refill he only got # 30. I spoke with Roselyn Reef at [facility] and she said [place] # 78 was picked up on 03/06/16. Pt will call and speak with [facility]. FYI to Avie Echevaria NP.

## 2016-03-11 ENCOUNTER — Encounter: Payer: Self-pay | Admitting: Internal Medicine

## 2016-03-11 ENCOUNTER — Ambulatory Visit (INDEPENDENT_AMBULATORY_CARE_PROVIDER_SITE_OTHER): Payer: Medicare HMO | Admitting: Internal Medicine

## 2016-03-11 DIAGNOSIS — I10 Essential (primary) hypertension: Secondary | ICD-10-CM

## 2016-03-11 DIAGNOSIS — J449 Chronic obstructive pulmonary disease, unspecified: Secondary | ICD-10-CM | POA: Diagnosis not present

## 2016-03-11 MED ORDER — LOSARTAN POTASSIUM 50 MG PO TABS: 50.0000 mg | ORAL_TABLET | Freq: Every day | ORAL | 5 refills | Status: DC

## 2016-03-11 MED ORDER — TIOTROPIUM BROMIDE-OLODATEROL 2.5-2.5 MCG/ACT IN AERS: 1.0000 | INHALATION_SPRAY | Freq: Every day | RESPIRATORY_TRACT | 2 refills | Status: DC

## 2016-03-11 NOTE — Assessment & Plan Note (Signed)
Improved on Losartan 50 mg Refilled today Will monitor

## 2016-03-11 NOTE — Assessment & Plan Note (Signed)
Ongoing cough and SOB because he can not afford inhaler D/c Advair Will see if insurance covers Stiolto If not, will try to send Advair to mail order to see if they will cover it.

## 2016-03-11 NOTE — Progress Notes (Signed)
Subjective:    Patient ID: Elijah Mcfarland, male    DOB: May 14, 1950, 66 y.o.   MRN: [id number]  HPI  Pt presents to the clinic today for 3 week follow up of HTN. His BP ranges 130-150's/90. Because his blood pressure was not controlled on Lisinopril, and he has a chronic cough (likely r/t COPD), I decided to switch him to Losartan 50 mg. He has been taking the medication as prescribed. He denies adverse side effects. His BP today is 128/78.  Of note, he reports he was not able to afford the Advair. It was > 300 dollars. He c/o ongoing cough and shortness of breath. He has tried multiple cough products OTC with minimal relief. He does not follow with a pulmonologist.  Review of Systems      Past Medical History:  Diagnosis Date  . Chronic pain syndrome    Left knee  . COPD (chronic obstructive pulmonary disease) ([place])   . DDD (degenerative disc disease), lumbar 11/2008   L5-S1 on plain film  . Emphysema lung ([place])   . GAD (generalized anxiety disorder)   . GERD (gastroesophageal reflux disease)   . History of multiple pulmonary nodules 07/2010   Follow up CT scans showed resolution by 03/2011--NO MALIGNANCY.  Marland Kitchen History of pneumonia 06/2010  . HTN (hypertension) 01/07/2014  . Osteoarthritis of left knee    + past injury of this knee--tibia fracture?  +left leg shorter than right  . Seizure disorder ([place])    3 total seizures (first was 1995); neuro eval done and recommended lifetime phenytoin (he had a seizure after coming off the med in the late 90s  . Tobacco dependence     Current Outpatient Prescriptions  Medication Sig Dispense Refill  . albuterol (PROVENTIL HFA;VENTOLIN HFA) 108 (90 BASE) MCG/ACT inhaler Inhale 1-2 puffs into the lungs every 6 (six) hours as needed for wheezing or shortness of breath. 1 Inhaler 5  . ALPRAZolam (XANAX) 0.5 MG tablet TAKE ONE TABLET TWICE DAILY 60 tablet 0  . aspirin 81 MG tablet Take 81 mg by mouth daily.    Marland Kitchen atorvastatin (LIPITOR) 10 MG  tablet TAKE ONE (1) TABLET EACH DAY 30 tablet 10  . citalopram (CELEXA) 40 MG tablet Take 1 tablet (40 mg total) by mouth daily. 30 tablet 3  . Fluticasone-Salmeterol (ADVAIR) 500-50 MCG/DOSE AEPB Inhale 1 puff into the lungs 2 (two) times daily. 60 each 2  . losartan (COZAAR) 50 MG tablet Take 1 tablet (50 mg total) by mouth daily. 30 tablet 0  . meloxicam (MOBIC) 15 MG tablet Take 1 tablet (15 mg total) by mouth daily. 30 tablet 0  . phenytoin (DILANTIN) 100 MG ER capsule TAKE THREE CAPSULES BY MOUTH DAILY 90 capsule 1  . Spacer/Aero-Holding Chambers (AEROCHAMBER PLUS FLO-VU) MISC 1 Units by Does not apply route as directed. 1 each 0   No current facility-administered medications for this visit.     No Known Allergies  Family History  Problem Relation Age of Onset  . Heart disease Mother   . Heart disease Father   . Cancer Sister   . Stroke Neg Hx     Social History   Social History  . Marital status: Married    Spouse name: N/A  . Number of children: N/A  . Years of education: N/A   Occupational History  . Not on file.   Social History Main Topics  . Smoking status: Current Every Day Smoker    Packs/day: 1.00  Years: 45.00    Types: Cigarettes  . Smokeless tobacco: Never Used  . Alcohol use No  . Drug use: No  . Sexual activity: Yes    Partners: Male   Other Topics Concern  . Not on file   Social History Narrative   Married, 2 grown children   Eighth grade education.  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   Originally from [place].   Tobacco 100 pack-yr hx, still smoking as of 10/2011 (1 pack per day).   Denies alcohol or drug use.   No exercise.     Constitutional: Denies fever, malaise, fatigue, headache or abrupt weight changes.  Respiratory: Pt reports cough and shortness of breath. Denies difficulty breathing or sputum production.   Cardiovascular: Denies chest pain, chest tightness, palpitations or swelling in the hands or  feet.  Neurological: Denies dizziness, difficulty with memory, difficulty with speech or problems with balance and coordination.    No other specific complaints in a complete review of systems (except as listed in HPI above).  Objective:   Physical Exam  BP 128/78   Pulse 77   Temp 98.1 F (36.7 C) (Oral)   Wt 134 lb (60.8 kg)   SpO2 97%   BMI 20.23 kg/m  Wt Readings from Last 3 Encounters:  03/11/16 134 lb (60.8 kg)  02/19/16 132 lb (59.9 kg)  07/28/15 123 lb (55.8 kg)    General: Appears his stated age, in NAD. Cardiovascular: Normal rate and rhythm. S1,S2 noted.   Pulmonary/Chest: Normal effort and coarsevesicular breath sounds. No respiratory distress. No wheezes, rales or ronchi noted.  Neurological: Alert and oriented.   BMET    Component Value Date/Time   NA 137 02/19/2016 1035   K 4.8 02/19/2016 1035   CL 103 02/19/2016 1035   CO2 30 02/19/2016 1035   GLUCOSE 90 02/19/2016 1035   BUN 9 02/19/2016 1035   CREATININE 0.76 02/19/2016 1035   CREATININE 0.81 11/03/2012 1011   CALCIUM 9.1 02/19/2016 1035   GFRNONAA >90 12/19/2013 0503   GFRAA >90 12/19/2013 0503    Lipid Panel     Component Value Date/Time   CHOL 201 (H) 02/19/2016 1035   TRIG 114.0 02/19/2016 1035   HDL 63.50 02/19/2016 1035   CHOLHDL 3 02/19/2016 1035   VLDL 22.8 02/19/2016 1035   LDLCALC 115 (H) 02/19/2016 1035    CBC    Component Value Date/Time   WBC 7.6 11/14/2014 0916   RBC 4.90 11/14/2014 0916   HGB 14.9 11/14/2014 0916   HCT 44.6 11/14/2014 0916   PLT 202.0 11/14/2014 0916   MCV 91.0 11/14/2014 0916   MCH 30.2 12/19/2013 0503   MCHC 33.3 11/14/2014 0916   RDW 14.3 11/14/2014 0916   LYMPHSABS 1.3 12/19/2013 0503   MONOABS 0.6 12/19/2013 0503   EOSABS 0.6 12/19/2013 0503   BASOSABS 0.0 12/19/2013 0503    Hgb A1C Lab Results  Component Value Date   HGBA1C 5.7 02/19/2016         Assessment & Plan:

## 2016-03-11 NOTE — Patient Instructions (Signed)
Hypertension Hypertension is another name for high blood pressure. High blood pressure forces your heart to work harder to pump blood. A blood pressure reading has two numbers, which includes a higher number over a lower number (example: 110/72). Follow these instructions at home:  Have your blood pressure rechecked by your doctor.  Only take medicine as told by your doctor. Follow the directions carefully. The medicine does not work as well if you skip doses. Skipping doses also puts you at risk for problems.  Do not smoke.  Monitor your blood pressure at home as told by your doctor. Contact a doctor if:  You think you are having a reaction to the medicine you are taking.  You have repeat headaches or feel dizzy.  You have puffiness (swelling) in your ankles.  You have trouble with your vision. Get help right away if:  You get a very bad headache and are confused.  You feel weak, numb, or faint.  You get chest or belly (abdominal) pain.  You throw up (vomit).  You cannot breathe very well. This information is not intended to replace advice given to you by your health care provider. Make sure you discuss any questions you have with your health care provider. Document Released: 07/21/2007 Document Revised: 07/10/2015 Document Reviewed: 11/24/2012 Elsevier Interactive Patient Education  2017 Elsevier Inc.  

## 2016-03-15 ENCOUNTER — Telehealth: Payer: Self-pay

## 2016-03-15 DIAGNOSIS — J441 Chronic obstructive pulmonary disease with (acute) exacerbation: Secondary | ICD-10-CM

## 2016-03-15 NOTE — Telephone Encounter (Signed)
Pt left v/m; stiolto respimat, breathing med was too expensive and pt could not get due to cost. Pt wants to know if med could be substituted that would be more affordable. Pt request cb.

## 2016-03-15 NOTE — Telephone Encounter (Signed)
Call pt:  Is he willing to do mail order? Advair should be affordable through mail order

## 2016-03-16 NOTE — Telephone Encounter (Signed)
Left detailed msg on VM per HIPAA  

## 2016-03-19 ENCOUNTER — Other Ambulatory Visit: Payer: Self-pay

## 2016-03-19 DIAGNOSIS — I10 Essential (primary) hypertension: Secondary | ICD-10-CM

## 2016-03-19 DIAGNOSIS — M1990 Unspecified osteoarthritis, unspecified site: Secondary | ICD-10-CM

## 2016-03-19 DIAGNOSIS — J441 Chronic obstructive pulmonary disease with (acute) exacerbation: Secondary | ICD-10-CM

## 2016-03-19 DIAGNOSIS — F411 Generalized anxiety disorder: Secondary | ICD-10-CM

## 2016-03-19 MED ORDER — MELOXICAM 15 MG PO TABS: 15.0000 mg | ORAL_TABLET | Freq: Every day | ORAL | 1 refills | Status: DC

## 2016-03-19 MED ORDER — FLUTICASONE-SALMETEROL 500-50 MCG/DOSE IN AEPB: 1.0000 | INHALATION_SPRAY | Freq: Two times a day (BID) | RESPIRATORY_TRACT | 1 refills | Status: DC

## 2016-03-19 MED ORDER — FLUTICASONE-SALMETEROL 500-50 MCG/DOSE IN AEPB: 1.0000 | INHALATION_SPRAY | Freq: Two times a day (BID) | RESPIRATORY_TRACT | 2 refills | Status: DC

## 2016-03-19 MED ORDER — LOSARTAN POTASSIUM 50 MG PO TABS: 50.0000 mg | ORAL_TABLET | Freq: Every day | ORAL | 1 refills | Status: DC

## 2016-03-19 MED ORDER — ALBUTEROL SULFATE HFA 108 (90 BASE) MCG/ACT IN AERS: 1.0000 | INHALATION_SPRAY | Freq: Four times a day (QID) | RESPIRATORY_TRACT | 1 refills | Status: DC | PRN

## 2016-03-19 MED ORDER — CITALOPRAM HYDROBROMIDE 40 MG PO TABS: 40.0000 mg | ORAL_TABLET | Freq: Every day | ORAL | 1 refills | Status: DC

## 2016-03-19 MED ORDER — ATORVASTATIN CALCIUM 10 MG PO TABS: 10.0000 mg | ORAL_TABLET | Freq: Every day | ORAL | 1 refills | Status: DC

## 2016-03-19 MED ORDER — PHENYTOIN SODIUM EXTENDED 100 MG PO CAPS: 300.0000 mg | ORAL_CAPSULE | Freq: Every day | ORAL | 1 refills | Status: DC

## 2016-03-19 NOTE — Telephone Encounter (Addendum)
Pt left v/m will get med from [facility] mail order since will be less expensive. FYI to Avie Echevaria NP that advair was sent to [facility] mail order as instructed.

## 2016-03-19 NOTE — Addendum Note (Signed)
Addended by: Helene Shoe on: 03/19/2016 09:45 AM   Modules accepted: Orders

## 2016-03-22 DIAGNOSIS — H35433 Paving stone degeneration of retina, bilateral: Secondary | ICD-10-CM | POA: Diagnosis not present

## 2016-03-22 DIAGNOSIS — H43813 Vitreous degeneration, bilateral: Secondary | ICD-10-CM | POA: Diagnosis not present

## 2016-03-22 DIAGNOSIS — H353231 Exudative age-related macular degeneration, bilateral, with active choroidal neovascularization: Secondary | ICD-10-CM | POA: Diagnosis not present

## 2016-03-29 ENCOUNTER — Telehealth: Payer: Self-pay

## 2016-03-29 MED ORDER — ALPRAZOLAM 0.5 MG PO TABS: 0.5000 mg | ORAL_TABLET | Freq: Two times a day (BID) | ORAL | 2 refills | Status: DC

## 2016-03-29 MED ORDER — ALPRAZOLAM 0.5 MG PO TABS: 0.5000 mg | ORAL_TABLET | Freq: Two times a day (BID) | ORAL | 0 refills | Status: DC

## 2016-03-29 NOTE — Telephone Encounter (Signed)
RX printed and signed and placed in MYD box 

## 2016-03-29 NOTE — Telephone Encounter (Signed)
Rx faxed to mail order with 2 refills ok per [facility]

## 2016-03-29 NOTE — Addendum Note (Signed)
Addended by: Lurlean Nanny on: 03/29/2016 04:13 PM   Modules accepted: Orders

## 2016-03-29 NOTE — Telephone Encounter (Signed)
Pt left v/m requesting refill Xanax to [facility] order pharmacy. Last refilled # 60 on 03/05/16. Pt last seen 02/19/16 that xanax was addressed.

## 2016-04-19 NOTE — Telephone Encounter (Signed)
Pt said [facility] mail order pharmacy needs a prior auth for xanax; [employer] was supposed to send request and pt was checking on status of refill. Pt request cb.

## 2016-04-20 MED ORDER — ALPRAZOLAM 0.5 MG PO TABS: 0.5000 mg | ORAL_TABLET | Freq: Two times a day (BID) | ORAL | 2 refills | Status: DC

## 2016-04-20 NOTE — Addendum Note (Signed)
Addended by: Lurlean Nanny on: 04/20/2016 12:59 PM   Modules accepted: Orders

## 2016-04-20 NOTE — Telephone Encounter (Signed)
I spoke to [facility] and they stated they received the fax blank... I called in Rx and pt is aware

## 2016-04-20 NOTE — Telephone Encounter (Signed)
I called Rx into [facility] as previously directed

## 2016-05-24 DIAGNOSIS — B309 Viral conjunctivitis, unspecified: Secondary | ICD-10-CM | POA: Diagnosis not present

## 2016-06-17 DIAGNOSIS — H10503 Unspecified blepharoconjunctivitis, bilateral: Secondary | ICD-10-CM | POA: Diagnosis not present

## 2016-06-22 ENCOUNTER — Other Ambulatory Visit: Payer: Self-pay | Admitting: Internal Medicine

## 2016-06-23 NOTE — Telephone Encounter (Signed)
Rx faxed to [facility] 

## 2016-06-23 NOTE — Telephone Encounter (Signed)
Last filled 04/20/16 with 2 refills to [facility] mail order--- please advise if okay to fax refill to mail order

## 2016-06-23 NOTE — Telephone Encounter (Signed)
Ok to refill to mail order, printed, signed and placed in MYD box

## 2016-06-24 DIAGNOSIS — H35433 Paving stone degeneration of retina, bilateral: Secondary | ICD-10-CM | POA: Diagnosis not present

## 2016-06-24 DIAGNOSIS — H43813 Vitreous degeneration, bilateral: Secondary | ICD-10-CM | POA: Diagnosis not present

## 2016-06-24 DIAGNOSIS — H353212 Exudative age-related macular degeneration, right eye, with inactive choroidal neovascularization: Secondary | ICD-10-CM | POA: Diagnosis not present

## 2016-06-24 DIAGNOSIS — H353221 Exudative age-related macular degeneration, left eye, with active choroidal neovascularization: Secondary | ICD-10-CM | POA: Diagnosis not present

## 2016-07-16 ENCOUNTER — Other Ambulatory Visit: Payer: Self-pay | Admitting: Internal Medicine

## 2016-08-02 ENCOUNTER — Encounter: Payer: Medicare HMO | Admitting: Internal Medicine

## 2016-08-03 ENCOUNTER — Other Ambulatory Visit: Payer: Self-pay | Admitting: Internal Medicine

## 2016-08-03 DIAGNOSIS — I10 Essential (primary) hypertension: Secondary | ICD-10-CM

## 2016-08-03 DIAGNOSIS — F411 Generalized anxiety disorder: Secondary | ICD-10-CM

## 2016-08-03 DIAGNOSIS — M1990 Unspecified osteoarthritis, unspecified site: Secondary | ICD-10-CM

## 2016-08-04 NOTE — Telephone Encounter (Signed)
Please advise if okay to refill Dilantin... Wellness exam scheduled for 08/13/16

## 2016-08-13 ENCOUNTER — Encounter: Payer: Self-pay | Admitting: Internal Medicine

## 2016-08-13 ENCOUNTER — Ambulatory Visit (INDEPENDENT_AMBULATORY_CARE_PROVIDER_SITE_OTHER): Payer: Medicare HMO | Admitting: Internal Medicine

## 2016-08-13 VITALS — BP 124/76 | HR 71 | Temp 98.1°F | Ht 68.0 in | Wt 121.8 lb

## 2016-08-13 DIAGNOSIS — R634 Abnormal weight loss: Secondary | ICD-10-CM | POA: Diagnosis not present

## 2016-08-13 DIAGNOSIS — N529 Male erectile dysfunction, unspecified: Secondary | ICD-10-CM

## 2016-08-13 DIAGNOSIS — E782 Mixed hyperlipidemia: Secondary | ICD-10-CM | POA: Diagnosis not present

## 2016-08-13 DIAGNOSIS — J449 Chronic obstructive pulmonary disease, unspecified: Secondary | ICD-10-CM

## 2016-08-13 DIAGNOSIS — R7303 Prediabetes: Secondary | ICD-10-CM | POA: Diagnosis not present

## 2016-08-13 DIAGNOSIS — M1990 Unspecified osteoarthritis, unspecified site: Secondary | ICD-10-CM

## 2016-08-13 DIAGNOSIS — G40309 Generalized idiopathic epilepsy and epileptic syndromes, not intractable, without status epilepticus: Secondary | ICD-10-CM | POA: Diagnosis not present

## 2016-08-13 DIAGNOSIS — Z0000 Encounter for general adult medical examination without abnormal findings: Secondary | ICD-10-CM

## 2016-08-13 DIAGNOSIS — Z0001 Encounter for general adult medical examination with abnormal findings: Secondary | ICD-10-CM | POA: Diagnosis not present

## 2016-08-13 DIAGNOSIS — F411 Generalized anxiety disorder: Secondary | ICD-10-CM | POA: Diagnosis not present

## 2016-08-13 DIAGNOSIS — I10 Essential (primary) hypertension: Secondary | ICD-10-CM | POA: Diagnosis not present

## 2016-08-13 DIAGNOSIS — F17200 Nicotine dependence, unspecified, uncomplicated: Secondary | ICD-10-CM | POA: Diagnosis not present

## 2016-08-13 DIAGNOSIS — Z125 Encounter for screening for malignant neoplasm of prostate: Secondary | ICD-10-CM

## 2016-08-13 LAB — CBC
HCT: 41 % (ref 39.0–52.0)
Hemoglobin: 13.8 g/dL (ref 13.0–17.0)
MCHC: 33.7 g/dL (ref 30.0–36.0)
MCV: 91.3 fl (ref 78.0–100.0)
PLATELETS: 194 10*3/uL (ref 150.0–400.0)
RBC: 4.49 Mil/uL (ref 4.22–5.81)
RDW: 14.4 % (ref 11.5–15.5)
WBC: 7.6 10*3/uL (ref 4.0–10.5)

## 2016-08-13 LAB — COMPREHENSIVE METABOLIC PANEL
ALT: 18 U/L (ref 0–53)
AST: 20 U/L (ref 0–37)
Albumin: 4.2 g/dL (ref 3.5–5.2)
Alkaline Phosphatase: 74 U/L (ref 39–117)
BILIRUBIN TOTAL: 0.4 mg/dL (ref 0.2–1.2)
BUN: 9 mg/dL (ref 6–23)
CHLORIDE: 103 meq/L (ref 96–112)
CO2: 32 meq/L (ref 19–32)
CREATININE: 0.78 mg/dL (ref 0.40–1.50)
Calcium: 9.3 mg/dL (ref 8.4–10.5)
GFR: 106.39 mL/min (ref >60.00)
GLUCOSE: 88 mg/dL (ref 70–99)
Potassium: 4.7 mEq/L (ref 3.5–5.1)
SODIUM: 139 meq/L (ref 135–145)
Total Protein: 6.8 g/dL (ref 6.0–8.3)

## 2016-08-13 LAB — LIPID PANEL
Cholesterol: 203 mg/dL — ABNORMAL HIGH (ref 0–200)
HDL: 60.6 mg/dL (ref >39.00)
LDL Cholesterol: 114 mg/dL — ABNORMAL HIGH (ref 0–99)
NonHDL: 142.34
Total CHOL/HDL Ratio: 3
Triglycerides: 144 mg/dL (ref 0.0–149.0)
VLDL: 28.8 mg/dL (ref 0.0–40.0)

## 2016-08-13 LAB — TSH: TSH: 2.65 u[IU]/mL (ref 0.35–4.50)

## 2016-08-13 LAB — PSA, MEDICARE: PSA: 1.4 ng/ml (ref 0.10–4.00)

## 2016-08-13 LAB — HEMOGLOBIN A1C: Hgb A1c MFr Bld: 5.6 % (ref 4.6–6.5)

## 2016-08-13 MED ORDER — TADALAFIL 20 MG PO TABS: 10.0000 mg | ORAL_TABLET | ORAL | 11 refills | Status: DC | PRN

## 2016-08-13 NOTE — Patient Instructions (Signed)

## 2016-08-13 NOTE — Progress Notes (Signed)
HPI:  Pt presents to the clinic today for his Medicare Wellness Exam. He is also due to follow up chronic conditions.  Macular Degeneration: He follows with opthalmology. He gets monthly intraocular injections.  Anxiety: Triggered by family stress. He takes Celexa daily as prescribed. He uses the Xanax 2 x day with good relief. He denies SI/HI.  Arthritis: Mainly in his back and knees. He takes Meloxicam daily with some relief.  COPD: He has persistent SOB. He is prescribed Advair. He uses his Albuterol as needed. He does continue to smoke.   Seizure Disorder: He has not had any recent seizures on Dilantin. He does not follow with neurology.  HTN: His BP today is 124/76. He is taking Losartan as prescribed. ECG from 12/2013 reviewed.  HLD: His last LDL was 115, 02/2016. He is taking Lipitor daily. He denies myalgias. He tries to consume a low fat diet.  Prediabetes: His last A1C was 5.7%, 02/2016. He denies increase thirst, urinary frequency, numbness or tingling in his feet or non healing wounds.  Past Medical History:  Diagnosis Date  . Chronic pain syndrome    Left knee  . COPD (chronic obstructive pulmonary disease) ([place])   . DDD (degenerative disc disease), lumbar 11/2008   L5-S1 on plain film  . Emphysema lung ([place])   . GAD (generalized anxiety disorder)   . GERD (gastroesophageal reflux disease)   . History of multiple pulmonary nodules 07/2010   Follow up CT scans showed resolution by 03/2011--NO MALIGNANCY.  Marland Kitchen History of pneumonia 06/2010  . HTN (hypertension) 01/07/2014  . Osteoarthritis of left knee    + past injury of this knee--tibia fracture?  +left leg shorter than right  . Seizure disorder ([place])    3 total seizures (first was 1995); neuro eval done and recommended lifetime phenytoin (he had a seizure after coming off the med in the late 90s  . Tobacco dependence     Current Outpatient Prescriptions  Medication Sig Dispense Refill  . albuterol (PROVENTIL  HFA;VENTOLIN HFA) 108 (90 Base) MCG/ACT inhaler Inhale 1-2 puffs into the lungs every 6 (six) hours as needed for wheezing or shortness of breath. 54 g 1  . ALPRAZolam (XANAX) 0.5 MG tablet TAKE 1 TABLET TWICE DAILY 60 tablet 2  . aspirin 81 MG tablet Take 81 mg by mouth daily.    Marland Kitchen atorvastatin (LIPITOR) 10 MG tablet TAKE 1 TABLET DAILY AT 6 PM. 90 tablet 0  . citalopram (CELEXA) 40 MG tablet TAKE 1 TABLET (40 MG TOTAL) BY MOUTH DAILY. 90 tablet 0  . Fluticasone-Salmeterol (ADVAIR) 500-50 MCG/DOSE AEPB Inhale 1 puff into the lungs 2 (two) times daily. 180 each 1  . losartan (COZAAR) 50 MG tablet TAKE 1 TABLET (50 MG TOTAL) BY MOUTH DAILY. 90 tablet 0  . meloxicam (MOBIC) 15 MG tablet TAKE 1 TABLET (15 MG TOTAL) BY MOUTH DAILY. 90 tablet 0  . phenytoin (DILANTIN) 100 MG ER capsule TAKE 3 CAPSULES (300 MG TOTAL) BY MOUTH DAILY. 270 capsule 0  . Spacer/Aero-Holding Chambers (AEROCHAMBER PLUS FLO-VU) MISC 1 Units by Does not apply route as directed. 1 each 0  . Tiotropium Bromide-Olodaterol (STIOLTO RESPIMAT) 2.5-2.5 MCG/ACT AERS Inhale 1 Act into the lungs daily. 1 Inhaler 2   No current facility-administered medications for this visit.     No Known Allergies  Family History  Problem Relation Age of Onset  . Heart disease Mother   . Heart disease Father   . Cancer Sister   .  Stroke Neg Hx     Social History   Social History  . Marital status: Married    Spouse name: N/A  . Number of children: N/A  . Years of education: N/A   Occupational History  . Not on file.   Social History Main Topics  . Smoking status: Current Every Day Smoker    Packs/day: 1.00    Years: 45.00    Types: Cigarettes  . Smokeless tobacco: Never Used  . Alcohol use No  . Drug use: No  . Sexual activity: Yes    Partners: Male   Other Topics Concern  . Not on file   Social History Narrative   Married, 2 grown children   Eighth grade education.  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    Originally from [place].   Tobacco 100 pack-yr hx, still smoking as of 10/2011 (1 pack per day).   Denies alcohol or drug use.   No exercise.    Hospitiliaztions: None  Health Maintenance:    Flu: 02/2016  Tetanus: 08/2008  Pneumovax: 02/2016  Prevnar: 10/2014  Zostavax: never  Shingrix: never  PSA: 03/2013  Colon Screening: 03/2008  Eye Doctor: yearly, 06/2016  Dental Exam: dentures   Providers:   PCP: Webb Silversmith, NP-C     I have personally reviewed and have noted:  1. The patient's medical and social history 2. Their use of alcohol, tobacco or illicit drugs 3. Their current medications and supplements 4. The patient's functional ability including ADL's, fall risks, home safety risks and hearing or visual impairment. 5. Diet and physical activities 6. Evidence for depression or mood disorder  Subjective:   Review of Systems:   Constitutional: Pt reports weight loss. Denies fever, malaise, fatigue, headache.  HEENT: Pt reports eye redness. Denies eye pain, ear pain, ringing in the ears, wax buildup, runny nose, nasal congestion, bloody nose, or sore throat. Respiratory: Pt reports shortness of breath. Denies difficulty breathing, cough or sputum production.   Cardiovascular: Denies chest pain, chest tightness, palpitations or swelling in the hands or feet.  Gastrointestinal: Denies abdominal pain, bloating, constipation, diarrhea or blood in the stool.  GU: Pt reports difficulty initiating and maintaining an erection. Denies urgency, frequency, pain with urination, burning sensation, blood in urine, odor or discharge. Musculoskeletal: Pt reports intermittent joint pain. Denies decrease in range of motion, difficulty with gait, muscle pain or joint swelling.  Skin: Denies redness, rashes, lesions or ulcercations.  Neurological: Denies dizziness, difficulty with memory, difficulty with speech or problems with balance and coordination.  Psych: Pt has history of  anxiety. Denies depression, SI/HI.  No other specific complaints in a complete review of systems (except as listed in HPI above).  Objective:  PE:   BP 124/76   Pulse 71   Temp 98.1 F (36.7 C) (Oral)   Ht 5\' 8"  (1.727 m)   Wt 121 lb 12 oz (55.2 kg)   SpO2 97%   BMI 18.51 kg/m   Wt Readings from Last 3 Encounters:  03/11/16 134 lb (60.8 kg)  02/19/16 132 lb (59.9 kg)  07/28/15 123 lb (55.8 kg)    General: Appears his stated age, chronically in NAD. Skin: Warm, dry and intact. Redness noted around bilateral eyes. HEENT: Head: normal shape and size; Eyes: sclera injected, no icterus, conjunctiva pink, PERRLA and EOMs intact; Ears: Tm's gray and intact, normal light reflex; Throat/Mouth: dentures present, mucosa pink and moist, no exudate, lesions or ulcerations noted.  Neck: Neck  supple, trachea midline. No masses, lumps or thyromegaly present.  Cardiovascular: Normal rate and rhythm. S1,S2 noted.  No murmur, rubs or gallops noted. No JVD or BLE edema. No carotid bruits noted. Pulmonary/Chest: Normal effort and positive vesicular breath sounds. No respiratory distress. No wheezes, rales or ronchi noted.  Abdomen: Soft and nontender. Normal bowel sounds. No distention or masses noted. Liver, spleen and kidneys non palpable. Musculoskeletal: Strength 5/5 BUE/BLE. No difficulty with gait. Neurological: Alert and oriented. Cranial nerves II-XII grossly intact. Coordination normal.  Psychiatric: Mood and affect normal. Behavior is normal. Judgment and thought content normal.     BMET    Component Value Date/Time   NA 137 02/19/2016 1035   K 4.8 02/19/2016 1035   CL 103 02/19/2016 1035   CO2 30 02/19/2016 1035   GLUCOSE 90 02/19/2016 1035   BUN 9 02/19/2016 1035   CREATININE 0.76 02/19/2016 1035   CREATININE 0.81 11/03/2012 1011   CALCIUM 9.1 02/19/2016 1035   GFRNONAA >90 12/19/2013 0503   GFRAA >90 12/19/2013 0503    Lipid Panel     Component Value Date/Time   CHOL  201 (H) 02/19/2016 1035   TRIG 114.0 02/19/2016 1035   HDL 63.50 02/19/2016 1035   CHOLHDL 3 02/19/2016 1035   VLDL 22.8 02/19/2016 1035   LDLCALC 115 (H) 02/19/2016 1035    CBC    Component Value Date/Time   WBC 7.6 11/14/2014 0916   RBC 4.90 11/14/2014 0916   HGB 14.9 11/14/2014 0916   HCT 44.6 11/14/2014 0916   PLT 202.0 11/14/2014 0916   MCV 91.0 11/14/2014 0916   MCH 30.2 12/19/2013 0503   MCHC 33.3 11/14/2014 0916   RDW 14.3 11/14/2014 0916   LYMPHSABS 1.3 12/19/2013 0503   MONOABS 0.6 12/19/2013 0503   EOSABS 0.6 12/19/2013 0503   BASOSABS 0.0 12/19/2013 0503    Hgb A1C Lab Results  Component Value Date   HGBA1C 5.7 02/19/2016      Assessment and Plan:   Medicare Annual Wellness Visit:  Diet: He does eat meat. He consumes fruits and veggies daily. He avoids fried food. He drinks mostly coffee, some milk. Physical activity:  None Depression/mood screen: Chronic, on meds Hearing: Intact to whispered voice Visual acuity: Grossly normal, performs annual eye exam  ADLs: Capable Fall risk: None Home safety: Good Cognitive evaluation: Intact to orientation, naming, recall and repetition EOL planning: No adv directives, full code/ I agree  Preventative Medicine: Flu, Tetanus, Pneumovax and Prevnar UTD. He declines Zostovax or Shingrix. Colon screening UTD. Encouraged him to see an eye doctor or dentist annually. No need for dentist exams. Encouraged him to consume a balanced diet and exercise regimen. Will check CBC, CMET, TSH, Lipid and A1C today.  Smoker:  Referral placed for lung cancer screening program. 1 ppd smoker for 48 years  Weight Loss:  TSH today  ED:  Will trial Cialis, eRx sent to pharmacy  Next appointment: RTC in 1 year for Medicare Wellness Exam   Webb Silversmith, NP

## 2016-08-13 NOTE — Assessment & Plan Note (Signed)
Encouraged him to consume a low fat diet CMET and lipid profile today Continue Lipitor, will adjust if needed based on labs

## 2016-08-13 NOTE — Assessment & Plan Note (Signed)
Chronic but stable on Celexa and Xanax Needed UDS today but forgot to tell the lab Will get at next visit

## 2016-08-13 NOTE — Assessment & Plan Note (Signed)
None on Dilantin CMET today

## 2016-08-13 NOTE — Assessment & Plan Note (Signed)
Continue Meloxicam  Encouraged him to stay active

## 2016-08-13 NOTE — Assessment & Plan Note (Signed)
Continue Advair and Albuterol Discussed smoking cessation, he is not interested at this time

## 2016-08-13 NOTE — Assessment & Plan Note (Addendum)
Controlled on Losartan CMET today

## 2016-08-13 NOTE — Assessment & Plan Note (Signed)
A1C today Will monitor

## 2016-08-16 ENCOUNTER — Telehealth: Payer: Self-pay | Admitting: *Deleted

## 2016-08-16 NOTE — Telephone Encounter (Signed)
Pt was prescribed cialis at his OV last week. Pt said Rx was to expensive but pharmacy advise pt that they have generic meds for ED and he request that Maryfrances Bunnell, NP send in a generic Rx for this

## 2016-08-16 NOTE — Telephone Encounter (Signed)
Ok to send in Cialis as generic, same instructions

## 2016-08-17 ENCOUNTER — Telehealth: Payer: Self-pay | Admitting: *Deleted

## 2016-08-17 DIAGNOSIS — Z87891 Personal history of nicotine dependence: Secondary | ICD-10-CM

## 2016-08-17 MED ORDER — SILDENAFIL CITRATE 25 MG PO TABS: 25.0000 mg | ORAL_TABLET | Freq: Every day | ORAL | 2 refills | Status: DC | PRN

## 2016-08-17 MED ORDER — TADALAFIL 20 MG PO TABS: 10.0000 mg | ORAL_TABLET | ORAL | 11 refills | Status: DC | PRN

## 2016-08-17 NOTE — Addendum Note (Signed)
Addended by: Lurlean Nanny on: 08/17/2016 02:13 PM   Modules accepted: Orders

## 2016-08-17 NOTE — Telephone Encounter (Signed)
Pt said the pharmacy called pt and cialis does not come in generic; pt wants to know if could be changed to generic viagra or sildenafil. [facility]. Please advise.

## 2016-08-17 NOTE — Telephone Encounter (Signed)
Received referral for initial lung cancer screening scan. Contacted patient and obtained smoking history,(current, 45 pack year) as well as answering questions related to screening process. Patient denies signs of lung cancer such as weight loss or hemoptysis. Patient denies comorbidity that would prevent curative treatment if lung cancer were found. Patient is scheduled for shared decision making visit and CT scan on 09/07/16.

## 2016-08-17 NOTE — Telephone Encounter (Signed)
Sildenafil is fine 25 mg tabs Take 1 tab as needed prior to sexual intercourse #10, 2 refills

## 2016-08-17 NOTE — Telephone Encounter (Signed)
Rx sent through [facility]  

## 2016-08-17 NOTE — Telephone Encounter (Signed)
Generic was sent to pharmacy Resent with note for generic

## 2016-08-17 NOTE — Addendum Note (Signed)
Addended by: Lurlean Nanny on: 08/17/2016 08:48 AM   Modules accepted: Orders

## 2016-09-02 ENCOUNTER — Other Ambulatory Visit: Payer: Self-pay

## 2016-09-02 NOTE — Telephone Encounter (Signed)
How would he feel about decreasing dose to 0.25 mg bid?

## 2016-09-02 NOTE — Telephone Encounter (Signed)
Pt left v/m requesting refill of xanax [facility]; if gets from mail order the xanax may arrive several days late; pt wants to leave other meds with mail order but wants to get xanax from [facility] from now on. Last refilled # 60 x 2 on 06/23/16. Annual exam on 08/13/16.Please advise.

## 2016-09-03 ENCOUNTER — Other Ambulatory Visit: Payer: Self-pay | Admitting: Internal Medicine

## 2016-09-03 MED ORDER — ALPRAZOLAM 0.5 MG PO TABS: 0.5000 mg | ORAL_TABLET | Freq: Two times a day (BID) | ORAL | 0 refills | Status: DC

## 2016-09-03 NOTE — Telephone Encounter (Signed)
Pt states he feels a lot better than prior with current dose and sig... He does not want to adjust at this time

## 2016-09-03 NOTE — Telephone Encounter (Signed)
Rx called in to pharmacy. 

## 2016-09-03 NOTE — Telephone Encounter (Signed)
Noted, ok to phone in Xanax

## 2016-09-07 ENCOUNTER — Ambulatory Visit
Admission: RE | Admit: 2016-09-07 | Discharge: 2016-09-07 | Disposition: A | Discharge status: Home / Self Care | Payer: Medicare HMO | Source: Ambulatory Visit | Attending: Oncology | Admitting: Oncology

## 2016-09-07 ENCOUNTER — Inpatient Hospital Stay: Payer: Medicare HMO | Attending: Oncology | Admitting: Oncology

## 2016-09-07 DIAGNOSIS — R918 Other nonspecific abnormal finding of lung field: Secondary | ICD-10-CM | POA: Insufficient documentation

## 2016-09-07 DIAGNOSIS — I2510 Atherosclerotic heart disease of native coronary artery without angina pectoris: Secondary | ICD-10-CM | POA: Insufficient documentation

## 2016-09-07 DIAGNOSIS — Z122 Encounter for screening for malignant neoplasm of respiratory organs: Secondary | ICD-10-CM | POA: Insufficient documentation

## 2016-09-07 DIAGNOSIS — I700 Atherosclerosis of aorta: Secondary | ICD-10-CM | POA: Diagnosis not present

## 2016-09-07 DIAGNOSIS — J432 Centrilobular emphysema: Secondary | ICD-10-CM | POA: Diagnosis not present

## 2016-09-07 DIAGNOSIS — Z87891 Personal history of nicotine dependence: Secondary | ICD-10-CM | POA: Diagnosis not present

## 2016-09-07 DIAGNOSIS — M47814 Spondylosis without myelopathy or radiculopathy, thoracic region: Secondary | ICD-10-CM | POA: Insufficient documentation

## 2016-09-07 DIAGNOSIS — F17210 Nicotine dependence, cigarettes, uncomplicated: Secondary | ICD-10-CM

## 2016-09-07 NOTE — Progress Notes (Signed)
In accordance with CMS guidelines, patient has met eligibility criteria including age, absence of signs or symptoms of lung cancer.  Social History  Substance Use Topics  . Smoking status: Current Every Day Smoker    Packs/day: 1.00    Years: 45.00    Types: Cigarettes  . Smokeless tobacco: Never Used  . Alcohol use No     A shared decision-making session was conducted prior to the performance of CT scan. This includes one or more decision aids, includes benefits and harms of screening, follow-up diagnostic testing, over-diagnosis, false positive rate, and total radiation exposure.  Counseling on the importance of adherence to annual lung cancer LDCT screening, impact of co-morbidities, and ability or willingness to undergo diagnosis and treatment is imperative for compliance of the program.  Counseling on the importance of continued smoking cessation for former smokers; the importance of smoking cessation for current smokers, and information about tobacco cessation interventions have been given to patient including [place] and [facility] quit [place] programs.  Written order for lung cancer screening with LDCT has been given to the patient and any and all questions have been answered to the best of my abilities.   Yearly follow up will be coordinated by Burgess Estelle, Thoracic Navigator.  Faythe Casa, NP 09/07/2016 1:50 PM

## 2016-09-13 DIAGNOSIS — H353212 Exudative age-related macular degeneration, right eye, with inactive choroidal neovascularization: Secondary | ICD-10-CM | POA: Diagnosis not present

## 2016-09-13 DIAGNOSIS — H35433 Paving stone degeneration of retina, bilateral: Secondary | ICD-10-CM | POA: Diagnosis not present

## 2016-09-13 DIAGNOSIS — H353221 Exudative age-related macular degeneration, left eye, with active choroidal neovascularization: Secondary | ICD-10-CM | POA: Diagnosis not present

## 2016-09-13 DIAGNOSIS — H10503 Unspecified blepharoconjunctivitis, bilateral: Secondary | ICD-10-CM | POA: Diagnosis not present

## 2016-09-14 ENCOUNTER — Telehealth: Payer: Self-pay | Admitting: *Deleted

## 2016-09-14 NOTE — Telephone Encounter (Signed)
Notified patient of LDCT lung cancer screening program results with recommendation for 12 month follow up imaging. Also notified of incidental findings noted below and is encouraged to discuss further with PCP who will receive a copy of this note and/or the CT report. Patient verbalizes understanding.   IMPRESSION: 1. Lung-RADS 2, benign appearance or behavior. Continue annual screening with low-dose chest CT without contrast in 12 months. 2.  Emphysema (ICD10-J43.9). 3. Coronary artery atherosclerosis. Aortic Atherosclerosis (ICD10-I70.0).

## 2016-09-29 ENCOUNTER — Other Ambulatory Visit: Payer: Self-pay | Admitting: Internal Medicine

## 2016-09-30 NOTE — Telephone Encounter (Signed)
Last filled 09/03/2016... Please advise

## 2016-09-30 NOTE — Telephone Encounter (Signed)
Ok to phone in Xanax to be filled on or after 8/20

## 2016-10-02 ENCOUNTER — Other Ambulatory Visit: Payer: Self-pay | Admitting: Internal Medicine

## 2016-10-04 NOTE — Telephone Encounter (Signed)
Rx called in to pharmacy. 

## 2016-11-02 ENCOUNTER — Other Ambulatory Visit: Payer: Self-pay | Admitting: Internal Medicine

## 2016-11-02 NOTE — Telephone Encounter (Signed)
Last filled 09/30/16... Please advise

## 2016-11-02 NOTE — Telephone Encounter (Signed)
Ok to phone in Xanax 

## 2016-11-02 NOTE — Telephone Encounter (Signed)
Rx called in to pharmacy. 

## 2016-11-30 ENCOUNTER — Other Ambulatory Visit: Payer: Self-pay | Admitting: Internal Medicine

## 2016-11-30 NOTE — Telephone Encounter (Signed)
Last filled 11/02/16... Please advise

## 2016-12-01 ENCOUNTER — Other Ambulatory Visit: Payer: Self-pay | Admitting: Internal Medicine

## 2016-12-01 NOTE — Telephone Encounter (Signed)
Rx called in to pharmacy. 

## 2016-12-01 NOTE — Telephone Encounter (Signed)
Ok to phone in Xanax 

## 2016-12-08 ENCOUNTER — Other Ambulatory Visit: Payer: Self-pay | Admitting: Internal Medicine

## 2016-12-08 DIAGNOSIS — M1990 Unspecified osteoarthritis, unspecified site: Secondary | ICD-10-CM

## 2016-12-08 DIAGNOSIS — I10 Essential (primary) hypertension: Secondary | ICD-10-CM

## 2016-12-08 DIAGNOSIS — F411 Generalized anxiety disorder: Secondary | ICD-10-CM

## 2016-12-29 ENCOUNTER — Other Ambulatory Visit: Payer: Self-pay | Admitting: Internal Medicine

## 2016-12-29 NOTE — Telephone Encounter (Signed)
Ok to phone in Xanax 

## 2016-12-29 NOTE — Telephone Encounter (Signed)
Last filled 12/01/16... Please advise

## 2016-12-30 ENCOUNTER — Other Ambulatory Visit: Payer: Self-pay | Admitting: Internal Medicine

## 2016-12-31 NOTE — Telephone Encounter (Signed)
Rx called in to pharmacy. 

## 2017-01-26 ENCOUNTER — Other Ambulatory Visit: Payer: Self-pay | Admitting: Internal Medicine

## 2017-01-26 NOTE — Telephone Encounter (Signed)
Last filled 12/29/16... Please advise

## 2017-01-26 NOTE — Telephone Encounter (Signed)
Ok to phone in Xanax to be filled on 12/14

## 2017-01-28 NOTE — Telephone Encounter (Signed)
Rx called in to pharmacy. 

## 2017-02-24 ENCOUNTER — Other Ambulatory Visit: Payer: Self-pay | Admitting: Internal Medicine

## 2017-03-29 ENCOUNTER — Other Ambulatory Visit: Payer: Self-pay | Admitting: Internal Medicine

## 2017-03-29 NOTE — Telephone Encounter (Signed)
Last filled 02/26/17... Please advise

## 2017-04-25 ENCOUNTER — Other Ambulatory Visit: Payer: Self-pay | Admitting: Internal Medicine

## 2017-04-25 NOTE — Telephone Encounter (Signed)
Last filled 03/29/17... Please advise

## 2017-05-24 ENCOUNTER — Other Ambulatory Visit: Payer: Self-pay | Admitting: Internal Medicine

## 2017-05-24 NOTE — Telephone Encounter (Signed)
Last filled 04/25/2017.... Please advise... Note to pharmacy [facility] on or after 05/26/2017

## 2017-05-30 DIAGNOSIS — H43813 Vitreous degeneration, bilateral: Secondary | ICD-10-CM | POA: Diagnosis not present

## 2017-05-30 DIAGNOSIS — H353223 Exudative age-related macular degeneration, left eye, with inactive scar: Secondary | ICD-10-CM | POA: Diagnosis not present

## 2017-05-30 DIAGNOSIS — H353211 Exudative age-related macular degeneration, right eye, with active choroidal neovascularization: Secondary | ICD-10-CM | POA: Diagnosis not present

## 2017-05-30 DIAGNOSIS — H35433 Paving stone degeneration of retina, bilateral: Secondary | ICD-10-CM | POA: Diagnosis not present

## 2017-06-21 ENCOUNTER — Other Ambulatory Visit: Payer: Self-pay | Admitting: Internal Medicine

## 2017-06-21 DIAGNOSIS — M1990 Unspecified osteoarthritis, unspecified site: Secondary | ICD-10-CM

## 2017-06-21 DIAGNOSIS — F411 Generalized anxiety disorder: Secondary | ICD-10-CM

## 2017-06-21 DIAGNOSIS — I10 Essential (primary) hypertension: Secondary | ICD-10-CM

## 2017-06-27 ENCOUNTER — Other Ambulatory Visit: Payer: Self-pay

## 2017-06-27 NOTE — Telephone Encounter (Signed)
Pharmacy faxed request for refill on Alprazolam 0.5mg .  Last Refilled:  05/24/17 (to be filled on or after 05/26/17)  Has AWV scheduled for 08/22/17 with Avie Echevaria, NP.  Please Advise.

## 2017-06-28 MED ORDER — ALPRAZOLAM 0.5 MG PO TABS: 0.5000 mg | ORAL_TABLET | Freq: Two times a day (BID) | ORAL | 0 refills | Status: DC | PRN

## 2017-06-30 DIAGNOSIS — H43813 Vitreous degeneration, bilateral: Secondary | ICD-10-CM | POA: Diagnosis not present

## 2017-06-30 DIAGNOSIS — H353231 Exudative age-related macular degeneration, bilateral, with active choroidal neovascularization: Secondary | ICD-10-CM | POA: Diagnosis not present

## 2017-06-30 DIAGNOSIS — H35433 Paving stone degeneration of retina, bilateral: Secondary | ICD-10-CM | POA: Diagnosis not present

## 2017-06-30 DIAGNOSIS — H353211 Exudative age-related macular degeneration, right eye, with active choroidal neovascularization: Secondary | ICD-10-CM | POA: Diagnosis not present

## 2017-07-26 ENCOUNTER — Other Ambulatory Visit: Payer: Self-pay | Admitting: Internal Medicine

## 2017-07-26 NOTE — Telephone Encounter (Signed)
Last filled 06/28/17 #60... Note to pharmacy TBF on or after 07/29/17... Please advise

## 2017-08-16 ENCOUNTER — Encounter: Payer: Self-pay | Admitting: Internal Medicine

## 2017-08-22 ENCOUNTER — Ambulatory Visit (INDEPENDENT_AMBULATORY_CARE_PROVIDER_SITE_OTHER): Payer: Medicare HMO | Admitting: Internal Medicine

## 2017-08-22 ENCOUNTER — Encounter: Payer: Self-pay | Admitting: Internal Medicine

## 2017-08-22 VITALS — BP 126/74 | HR 89 | Temp 98.4°F | Ht 68.0 in | Wt 125.0 lb

## 2017-08-22 DIAGNOSIS — M1990 Unspecified osteoarthritis, unspecified site: Secondary | ICD-10-CM | POA: Diagnosis not present

## 2017-08-22 DIAGNOSIS — J449 Chronic obstructive pulmonary disease, unspecified: Secondary | ICD-10-CM

## 2017-08-22 DIAGNOSIS — I10 Essential (primary) hypertension: Secondary | ICD-10-CM

## 2017-08-22 DIAGNOSIS — H3530 Unspecified macular degeneration: Secondary | ICD-10-CM | POA: Insufficient documentation

## 2017-08-22 DIAGNOSIS — F411 Generalized anxiety disorder: Secondary | ICD-10-CM

## 2017-08-22 DIAGNOSIS — R7303 Prediabetes: Secondary | ICD-10-CM

## 2017-08-22 DIAGNOSIS — E7800 Pure hypercholesterolemia, unspecified: Secondary | ICD-10-CM | POA: Diagnosis not present

## 2017-08-22 DIAGNOSIS — Z125 Encounter for screening for malignant neoplasm of prostate: Secondary | ICD-10-CM | POA: Diagnosis not present

## 2017-08-22 DIAGNOSIS — E782 Mixed hyperlipidemia: Secondary | ICD-10-CM

## 2017-08-22 DIAGNOSIS — Z79899 Other long term (current) drug therapy: Secondary | ICD-10-CM | POA: Diagnosis not present

## 2017-08-22 DIAGNOSIS — Z1211 Encounter for screening for malignant neoplasm of colon: Secondary | ICD-10-CM

## 2017-08-22 DIAGNOSIS — G40309 Generalized idiopathic epilepsy and epileptic syndromes, not intractable, without status epilepticus: Secondary | ICD-10-CM

## 2017-08-22 DIAGNOSIS — Z0000 Encounter for general adult medical examination without abnormal findings: Secondary | ICD-10-CM

## 2017-08-22 LAB — LIPID PANEL
Cholesterol: 198 mg/dL (ref 0–200)
HDL: 62.5 mg/dL (ref >39.00)
LDL CALC: 107 mg/dL — AB (ref 0–99)
NONHDL: 135.5
Total CHOL/HDL Ratio: 3
Triglycerides: 145 mg/dL (ref 0.0–149.0)
VLDL: 29 mg/dL (ref 0.0–40.0)

## 2017-08-22 LAB — PSA, MEDICARE: PSA: 1.32 ng/ml (ref 0.10–4.00)

## 2017-08-22 LAB — CBC
HEMATOCRIT: 39.6 % (ref 39.0–52.0)
Hemoglobin: 13.4 g/dL (ref 13.0–17.0)
MCHC: 33.9 g/dL (ref 30.0–36.0)
MCV: 92.4 fl (ref 78.0–100.0)
Platelets: 196 10*3/uL (ref 150.0–400.0)
RBC: 4.29 Mil/uL (ref 4.22–5.81)
RDW: 14.5 % (ref 11.5–15.5)
WBC: 7.5 10*3/uL (ref 4.0–10.5)

## 2017-08-22 LAB — COMPREHENSIVE METABOLIC PANEL
ALK PHOS: 63 U/L (ref 39–117)
ALT: 16 U/L (ref 0–53)
AST: 16 U/L (ref 0–37)
Albumin: 4.1 g/dL (ref 3.5–5.2)
BUN: 7 mg/dL (ref 6–23)
CHLORIDE: 101 meq/L (ref 96–112)
CO2: 30 mEq/L (ref 19–32)
Calcium: 8.9 mg/dL (ref 8.4–10.5)
Creatinine, Ser: 0.81 mg/dL (ref 0.40–1.50)
GFR: 101.53 mL/min (ref >60.00)
GLUCOSE: 221 mg/dL — AB (ref 70–99)
POTASSIUM: 4.5 meq/L (ref 3.5–5.1)
SODIUM: 135 meq/L (ref 135–145)
TOTAL PROTEIN: 6.4 g/dL (ref 6.0–8.3)
Total Bilirubin: 0.3 mg/dL (ref 0.2–1.2)

## 2017-08-22 LAB — HEMOGLOBIN A1C: Hgb A1c MFr Bld: 5.8 % (ref 4.6–6.5)

## 2017-08-22 NOTE — Patient Instructions (Signed)

## 2017-08-22 NOTE — Assessment & Plan Note (Signed)
Advised him to use the Advair and Albuterol as prescribed Will get yearly CT scan imaging for lung cancer detection

## 2017-08-22 NOTE — Assessment & Plan Note (Signed)
None on Dilantin CMET today

## 2017-08-22 NOTE — Progress Notes (Signed)
HPI:  Pt presents to the clinic today for his Medicare Wellness Exam. He is also due to follow up chronic conditions.  Macular Degeneration: Deteriorated, he is slowly loosing his eyesight. Getting monthly injections but they are not helping. followed by ophthalmology.  Anxiety: Triggered by family stress. He is taking Celexa as prescribed. He uses Xanax 2 x day with good relief. He denies depression, SI/HI.  Arthritis: Mainly in his back and knees. He takes Meloxicam as prescribed, not sure if it is helping any longer.  COPD: Persistent shortness of breath but no cough. He is prescribed Advair but takes Spirva and Albuterol as prescribed. He does smoke. PFT's from 2015 reviewed.  Seizure Disorder: No recent seizures on Dilantin. He does not follow with neurology.  HTN: His BP today is 126/74. He is taking Losartan as prescribed. ECG from 12/2013 reviewed.  HLD: His last LDL was 114, 07/2016. He denies myalgias on Atorvastatin. He consumes a low fat diet.  Prediabets: His last A1C was 5.6%, 07/2016. He is no taking any diabetic medications at this time.  Past Medical History:  Diagnosis Date  . Chronic pain syndrome    Left knee  . COPD (chronic obstructive pulmonary disease) ([place])   . DDD (degenerative disc disease), lumbar 11/2008   L5-S1 on plain film  . Emphysema lung ([place])   . GAD (generalized anxiety disorder)   . GERD (gastroesophageal reflux disease)   . History of multiple pulmonary nodules 07/2010   Follow up CT scans showed resolution by 03/2011--NO MALIGNANCY.  Marland Kitchen History of pneumonia 06/2010  . HTN (hypertension) 01/07/2014  . Osteoarthritis of left knee    + past injury of this knee--tibia fracture?  +left leg shorter than right  . Seizure disorder ([place])    3 total seizures (first was 1995); neuro eval done and recommended lifetime phenytoin (he had a seizure after coming off the med in the late 90s  . Tobacco dependence     Current Outpatient Medications  Medication  Sig Dispense Refill  . albuterol (PROVENTIL HFA;VENTOLIN HFA) 108 (90 Base) MCG/ACT inhaler Inhale 1-2 puffs into the lungs every 6 (six) hours as needed for wheezing or shortness of breath. 54 g 1  . ALPRAZolam (XANAX) 0.5 MG tablet Take 1 tablet (0.5 mg total) by mouth 2 (two) times daily as needed. 60 tablet 0  . aspirin 81 MG tablet Take 81 mg by mouth daily.    Marland Kitchen atorvastatin (LIPITOR) 10 MG tablet TAKE 1 TABLET EVERY DAY AT 6 PM. 90 tablet 0  . citalopram (CELEXA) 40 MG tablet TAKE 1 TABLET EVERY DAY 90 tablet 0  . doxycycline (VIBRAMYCIN) 50 MG capsule     . Fluticasone-Salmeterol (ADVAIR) 500-50 MCG/DOSE AEPB Inhale 1 puff into the lungs 2 (two) times daily. 180 each 1  . losartan (COZAAR) 50 MG tablet TAKE 1 TABLET EVERY DAY 90 tablet 0  . meloxicam (MOBIC) 15 MG tablet TAKE 1 TABLET EVERY DAY 90 tablet 0  . neomycin-polymyxin b-dexamethasone (MAXITROL) 3.5-10000-0.1 SUSP     . phenytoin (DILANTIN) 100 MG ER capsule TAKE 3 CAPSULES EVERY DAY 270 capsule 0  . sildenafil (VIAGRA) 25 MG tablet Take 1 tablet (25 mg total) by mouth daily as needed for erectile dysfunction. 10 tablet 2  . Spacer/Aero-Holding Chambers (AEROCHAMBER PLUS FLO-VU) MISC 1 Units by Does not apply route as directed. 1 each 0   No current facility-administered medications for this visit.     No Known Allergies  Family History  Problem Relation Age of Onset  . Heart disease Mother   . Heart disease Father   . Cancer Sister   . Stroke Neg Hx     Social History   Socioeconomic History  . Marital status: Married    Spouse name: Not on file  . Number of children: Not on file  . Years of education: Not on file  . Highest education level: Not on file  Occupational History  . Not on file  Social Needs  . Financial resource strain: Not on file  . Food insecurity:    Worry: Not on file    Inability: Not on file  . Transportation needs:    Medical: Not on file    Non-medical: Not on file  Tobacco Use  .  Smoking status: Current Every Day Smoker    Packs/day: 1.00    Years: 45.00    Pack years: 45.00    Types: Cigarettes  . Smokeless tobacco: Never Used  Substance and Sexual Activity  . Alcohol use: No    Alcohol/week: 0.0 oz  . Drug use: No  . Sexual activity: Yes    Partners: Male  Lifestyle  . Physical activity:    Days per week: Not on file    Minutes per session: Not on file  . Stress: Not on file  Relationships  . Social connections:    Talks on phone: Not on file    Gets together: Not on file    Attends religious service: Not on file    Active member of club or organization: Not on file    Attends meetings of clubs or organizations: Not on file    Relationship status: Not on file  . Intimate partner violence:    Fear of current or ex partner: Not on file    Emotionally abused: Not on file    Physically abused: Not on file    Forced sexual activity: Not on file  Other Topics Concern  . Not on file  Social History Narrative   Married, 2 grown children   Eighth grade education.  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   Originally from [place].   Tobacco 100 pack-yr hx, still smoking as of 10/2011 (1 pack per day).   Denies alcohol or drug use.   No exercise.    Hospitiliaztions: None  Health Maintenance:    Flu: 02/2017  Tetanus: 08/2008  Pneumovax: 02/2016  Prevnar: 10/2014  Zostavax: never  Shingrix: never  PSA: 07/2016  Colon Screening: 03/2008  Eye Doctor: multiple times per year  Dental Exam: as needed   Providers:   PCP: Webb Silversmith, NP-C   I have personally reviewed and have noted:  1. The patient's medical and social history 2. Their use of alcohol, tobacco or illicit drugs 3. Their current medications and supplements 4. The patient's functional ability including ADL's, fall risks, home safety risks and hearing or visual impairment. 5. Diet and physical activities 6. Evidence for depression or mood  disorder  Subjective:   Review of Systems:   Constitutional: Denies fever, malaise, fatigue, headache or abrupt weight changes.  HEENT: Pt reports vision loss. Denies eye pain, eye redness, ear pain, ringing in the ears, wax buildup, runny nose, nasal congestion, bloody nose, or sore throat. Respiratory: Pt reports shortness of breath. Denies difficulty breathing, cough or sputum production.   Cardiovascular: Denies chest pain, chest tightness, palpitations or swelling in the hands or feet.  Gastrointestinal: Denies abdominal pain, bloating,  constipation, diarrhea or blood in the stool.  GU: Denies urgency, frequency, pain with urination, burning sensation, blood in urine, odor or discharge. Musculoskeletal: Pt reports back and knee pain. Denies decrease in range of motion, difficulty with gait, muscle pain or joint swelling.  Skin: Denies redness, rashes, lesions or ulcercations.  Neurological: Pt reports difficulty with speech and coordination. Denies dizziness, difficulty with memory, difficulty with speech.  Psych: Pt has a history of anxiety. Denies depression, SI/HI.  No other specific complaints in a complete review of systems (except as listed in HPI above).  Objective:  PE:   BP 126/74   Pulse 89   Temp 98.4 F (36.9 C) (Oral)   Ht 5\' 8"  (1.727 m)   Wt 125 lb (56.7 kg)   SpO2 94%   BMI 19.01 kg/m   Wt Readings from Last 3 Encounters:  09/07/16 121 lb (54.9 kg)  08/13/16 121 lb 12 oz (55.2 kg)  03/11/16 134 lb (60.8 kg)    General: Appears older than his stated age, in NAD. Skin: Warm, dry and intact.  HEENT: Head: normal shape and size; Ears: Tm's gray and intact, normal light reflex; Throat/Mouth: Teeth present, mucosa pink and moist, no exudate, lesions or ulcerations noted.  Neck: Neck supple, trachea midline. No masses, lumps or thyromegaly present.  Cardiovascular: Normal rate and rhythm. S1,S2 noted.  No murmur, rubs or gallops noted. No JVD or BLE edema. No  carotid bruits noted. Pulmonary/Chest: Normal effort and positive vesicular breath sounds. No respiratory distress. No wheezes, rales or ronchi noted.  Abdomen: Soft and nontender. Normal bowel sounds. No distention or masses noted. Liver, spleen and kidneys non palpable. Musculoskeletal: Strength 5/5 BUE/BLE. Needs guidance with normal gait. Neurological: Alert and oriented. Cranial nerves II-XII grossly intact.  Psychiatric: Mood and affect normal. Behavior is normal. Judgment and thought content normal.    BMET    Component Value Date/Time   NA 139 08/13/2016 1337   K 4.7 08/13/2016 1337   CL 103 08/13/2016 1337   CO2 32 08/13/2016 1337   GLUCOSE 88 08/13/2016 1337   BUN 9 08/13/2016 1337   CREATININE 0.78 08/13/2016 1337   CREATININE 0.81 11/03/2012 1011   CALCIUM 9.3 08/13/2016 1337   GFRNONAA >90 12/19/2013 0503   GFRAA >90 12/19/2013 0503    Lipid Panel     Component Value Date/Time   CHOL 203 (H) 08/13/2016 1337   TRIG 144.0 08/13/2016 1337   HDL 60.60 08/13/2016 1337   CHOLHDL 3 08/13/2016 1337   VLDL 28.8 08/13/2016 1337   LDLCALC 114 (H) 08/13/2016 1337    CBC    Component Value Date/Time   WBC 7.6 08/13/2016 1337   RBC 4.49 08/13/2016 1337   HGB 13.8 08/13/2016 1337   HCT 41.0 08/13/2016 1337   PLT 194.0 08/13/2016 1337   MCV 91.3 08/13/2016 1337   MCH 30.2 12/19/2013 0503   MCHC 33.7 08/13/2016 1337   RDW 14.4 08/13/2016 1337   LYMPHSABS 1.3 12/19/2013 0503   MONOABS 0.6 12/19/2013 0503   EOSABS 0.6 12/19/2013 0503   BASOSABS 0.0 12/19/2013 0503    Hgb A1C Lab Results  Component Value Date   HGBA1C 5.6 08/13/2016      Assessment and Plan:   Medicare Annual Wellness Visit:  Diet: He does eat meat. He consumes fruits and veggies. He tries to avoid fried foods. He drinks mostly water. Physical activity: None due to near blindness. Depression/mood screen: Positive, on meds. Hearing: Intact to whispered voice  Visual acuity: Deteriorated,  follows with opthalomolgy ADLs: Needs assist due to vision needs Fall risk: High Home safety: Good Cognitive evaluation: Intact to orientation, naming, recall and repetition EOL planning: No adv directives, full code/ I agree  Preventative Medicine: Encouraged him to get a flu shot in the fall. Tetanus UTD. Pneumovax and prevanr UTD. He declines shingrix or zostovax. Referral to GI for screening colonoscopy. Encouraged him to consume a balanced diet and exercise regimen. Advised him to see an eye doctor and dentist annually. Will check CBC, CMET, Lipid, A1C and PSA today.   Next appointment: 1 year, Medicare Wellness Exam   Webb Silversmith, NP

## 2017-08-22 NOTE — Assessment & Plan Note (Signed)
Persistent Continue Meloxicam Advised Tylenol 1000 mg 3 x day

## 2017-08-22 NOTE — Assessment & Plan Note (Signed)
CMET and Lipid profile today Encouraged him to consume a low fat diet Continue Atorvastatin, will adjust if needed based on labs

## 2017-08-22 NOTE — Assessment & Plan Note (Signed)
Deteriorated Offered referral to [facility] or [facility] for second opinion, he will think about this

## 2017-08-22 NOTE — Assessment & Plan Note (Signed)
A1C today. 

## 2017-08-22 NOTE — Assessment & Plan Note (Signed)
Controlled on Losartan CBC and CMET today

## 2017-08-22 NOTE — Addendum Note (Signed)
Addended by: Lurlean Nanny on: 08/22/2017 02:57 PM   Modules accepted: Orders

## 2017-08-22 NOTE — Assessment & Plan Note (Signed)
Chronic with new ongoing vision loss issues Continue Celexa and Xanax CSA today

## 2017-08-23 ENCOUNTER — Other Ambulatory Visit: Payer: Self-pay

## 2017-08-23 LAB — PAIN MGMT, PROFILE 8 W/CONF, U
6 Acetylmorphine: NEGATIVE ng/mL (ref <10)
Alcohol Metabolites: NEGATIVE ng/mL (ref <500)
Amphetamines: NEGATIVE ng/mL (ref <500)
BENZODIAZEPINES: NEGATIVE ng/mL (ref <100)
BUPRENORPHINE, URINE: NEGATIVE ng/mL (ref <5)
CREATININE: 41.8 mg/dL
Cocaine Metabolite: NEGATIVE ng/mL (ref <150)
MDMA: NEGATIVE ng/mL (ref <500)
Marijuana Metabolite: NEGATIVE ng/mL (ref <20)
Opiates: NEGATIVE ng/mL (ref <100)
Oxidant: NEGATIVE ug/mL (ref <200)
Oxycodone: NEGATIVE ng/mL (ref <100)
PH: 6.62 (ref 4.5–9.0)

## 2017-08-23 NOTE — Telephone Encounter (Signed)
Last filled for on or after 07/29/17, but pharmacy not open on 08/28/17 as it is Sunday--- note to pharmacy says TBF on or after 08/27/17... Please advise if this is okay

## 2017-08-24 MED ORDER — ALPRAZOLAM 0.5 MG PO TABS: 0.5000 mg | ORAL_TABLET | Freq: Two times a day (BID) | ORAL | 0 refills | Status: DC | PRN

## 2017-08-29 ENCOUNTER — Telehealth: Payer: Self-pay | Admitting: Nurse Practitioner

## 2017-08-30 ENCOUNTER — Telehealth: Payer: Self-pay | Admitting: *Deleted

## 2017-08-30 ENCOUNTER — Encounter (INDEPENDENT_AMBULATORY_CARE_PROVIDER_SITE_OTHER): Payer: Self-pay | Admitting: *Deleted

## 2017-08-30 DIAGNOSIS — Z87891 Personal history of nicotine dependence: Secondary | ICD-10-CM

## 2017-08-30 DIAGNOSIS — Z122 Encounter for screening for malignant neoplasm of respiratory organs: Secondary | ICD-10-CM

## 2017-08-30 NOTE — Telephone Encounter (Signed)
Notified patient that annual lung cancer screening low dose CT scan is due currently or will be in near future. Confirmed that patient is within the age range of 55-77, and asymptomatic, (no signs or symptoms of lung cancer). Patient denies illness that would prevent curative treatment for lung cancer if found. Verified smoking history, (current, 46 pack year). The shared decision making visit was done 09/07/16. Patient is agreeable for CT scan being scheduled.

## 2017-09-07 ENCOUNTER — Ambulatory Visit: Payer: Medicare HMO

## 2017-09-08 ENCOUNTER — Telehealth: Payer: Self-pay | Admitting: Gastroenterology

## 2017-09-08 ENCOUNTER — Other Ambulatory Visit: Payer: Self-pay

## 2017-09-08 DIAGNOSIS — Z1211 Encounter for screening for malignant neoplasm of colon: Secondary | ICD-10-CM

## 2017-09-08 NOTE — Telephone Encounter (Signed)
Gastroenterology Pre-Procedure Review  Request Date: 09/28/17  [facility] Requesting Physician: Dr. Marius Ditch  PATIENT REVIEW QUESTIONS: The patient responded to the following health history questions as indicated:    1. Are you having any GI issues? no 2. Do you have a personal history of Polyps? no 3. Do you have a family history of Colon Cancer or Polyps? no 4. Diabetes Mellitus? no 5. Joint replacements in the past 12 months?no 6. Major health problems in the past 3 months?no 7. Any artificial heart valves, MVP, or defibrillator?no    MEDICATIONS & ALLERGIES:    Patient reports the following regarding taking any anticoagulation/antiplatelet therapy:   Plavix, Coumadin, Eliquis, Xarelto, Lovenox, Pradaxa, Brilinta, or Effient? no Aspirin? yes (81 mg)  Patient confirms/reports the following medications:  Current Outpatient Medications  Medication Sig Dispense Refill  . albuterol (PROVENTIL HFA;VENTOLIN HFA) 108 (90 Base) MCG/ACT inhaler Inhale 1-2 puffs into the lungs every 6 (six) hours as needed for wheezing or shortness of breath. 54 g 1  . ALPRAZolam (XANAX) 0.5 MG tablet Take 1 tablet (0.5 mg total) by mouth 2 (two) times daily as needed. 60 tablet 0  . aspirin 81 MG tablet Take 81 mg by mouth daily.    Marland Kitchen atorvastatin (LIPITOR) 10 MG tablet TAKE 1 TABLET EVERY DAY AT 6 PM. 90 tablet 0  . citalopram (CELEXA) 40 MG tablet TAKE 1 TABLET EVERY DAY 90 tablet 0  . diphenhydramine-acetaminophen (TYLENOL PM) 25-500 MG TABS tablet Take 1 tablet by mouth at bedtime as needed.    . Fluticasone-Salmeterol (ADVAIR) 500-50 MCG/DOSE AEPB Inhale 1 puff into the lungs 2 (two) times daily. 180 each 1  . guaiFENesin (COUGH SYRUP PO) Take by mouth as needed.    Marland Kitchen losartan (COZAAR) 50 MG tablet TAKE 1 TABLET EVERY DAY 90 tablet 0  . meloxicam (MOBIC) 15 MG tablet TAKE 1 TABLET EVERY DAY 90 tablet 0  . neomycin-polymyxin b-dexamethasone (MAXITROL) 3.5-10000-0.1 SUSP     . phenytoin (DILANTIN) 100 MG ER  capsule TAKE 3 CAPSULES EVERY DAY 270 capsule 0  . sildenafil (VIAGRA) 25 MG tablet Take 1 tablet (25 mg total) by mouth daily as needed for erectile dysfunction. 10 tablet 2  . Spacer/Aero-Holding Chambers (AEROCHAMBER PLUS FLO-VU) MISC 1 Units by Does not apply route as directed. 1 each 0   No current facility-administered medications for this visit.     Patient confirms/reports the following allergies:  No Known Allergies  No orders of the defined types were placed in this encounter.   AUTHORIZATION INFORMATION Primary Insurance: 1D#: Group #:  Secondary Insurance: 1D#: Group #:  SCHEDULE INFORMATION: Date: 09/28/17    [place] Time: Location: [facility]

## 2017-09-09 ENCOUNTER — Ambulatory Visit: Admission: RE | Admit: 2017-09-09 | Payer: Medicare HMO | Source: Ambulatory Visit

## 2017-09-16 ENCOUNTER — Other Ambulatory Visit: Payer: Self-pay | Admitting: Internal Medicine

## 2017-09-16 DIAGNOSIS — M1990 Unspecified osteoarthritis, unspecified site: Secondary | ICD-10-CM

## 2017-09-16 DIAGNOSIS — F411 Generalized anxiety disorder: Secondary | ICD-10-CM

## 2017-09-16 DIAGNOSIS — I10 Essential (primary) hypertension: Secondary | ICD-10-CM

## 2017-09-17 ENCOUNTER — Telehealth: Payer: Self-pay

## 2017-09-17 NOTE — Telephone Encounter (Signed)
Current smoker 1 pk a day . Scan any day afternoon . N/a health issues

## 2017-09-20 ENCOUNTER — Ambulatory Visit
Admission: RE | Admit: 2017-09-20 | Discharge: 2017-09-20 | Disposition: A | Discharge status: Home / Self Care | Payer: Medicare HMO | Source: Ambulatory Visit | Attending: Oncology | Admitting: Oncology

## 2017-09-20 DIAGNOSIS — H353233 Exudative age-related macular degeneration, bilateral, with inactive scar: Secondary | ICD-10-CM | POA: Diagnosis not present

## 2017-09-20 DIAGNOSIS — Z87891 Personal history of nicotine dependence: Secondary | ICD-10-CM | POA: Insufficient documentation

## 2017-09-20 DIAGNOSIS — J432 Centrilobular emphysema: Secondary | ICD-10-CM | POA: Insufficient documentation

## 2017-09-20 DIAGNOSIS — F17210 Nicotine dependence, cigarettes, uncomplicated: Secondary | ICD-10-CM | POA: Diagnosis not present

## 2017-09-20 DIAGNOSIS — I700 Atherosclerosis of aorta: Secondary | ICD-10-CM | POA: Diagnosis not present

## 2017-09-20 DIAGNOSIS — Z122 Encounter for screening for malignant neoplasm of respiratory organs: Secondary | ICD-10-CM | POA: Insufficient documentation

## 2017-09-23 ENCOUNTER — Other Ambulatory Visit: Payer: Self-pay | Admitting: Internal Medicine

## 2017-09-23 ENCOUNTER — Encounter: Payer: Self-pay | Admitting: *Deleted

## 2017-09-26 NOTE — Telephone Encounter (Signed)
Last filled for 08/27/2017 #60... UDS/CSA 08/2017 to repeat for 10/2017... Please advise

## 2017-09-28 ENCOUNTER — Encounter
Admission: RE | Disposition: A | Discharge status: Home / Self Care | Payer: Self-pay | Source: Ambulatory Visit | Attending: Gastroenterology

## 2017-09-28 ENCOUNTER — Ambulatory Visit: Payer: Medicare HMO | Admitting: Certified Registered Nurse Anesthetist

## 2017-09-28 ENCOUNTER — Ambulatory Visit
Admission: RE | Admit: 2017-09-28 | Discharge: 2017-09-28 | Disposition: A | Discharge status: Home / Self Care | Payer: Medicare HMO | Source: Ambulatory Visit | Attending: Gastroenterology | Admitting: Gastroenterology

## 2017-09-28 ENCOUNTER — Encounter: Payer: Self-pay | Admitting: Anesthesiology

## 2017-09-28 DIAGNOSIS — F411 Generalized anxiety disorder: Secondary | ICD-10-CM | POA: Insufficient documentation

## 2017-09-28 DIAGNOSIS — J439 Emphysema, unspecified: Secondary | ICD-10-CM | POA: Insufficient documentation

## 2017-09-28 DIAGNOSIS — Z79899 Other long term (current) drug therapy: Secondary | ICD-10-CM | POA: Diagnosis not present

## 2017-09-28 DIAGNOSIS — G40909 Epilepsy, unspecified, not intractable, without status epilepticus: Secondary | ICD-10-CM | POA: Diagnosis not present

## 2017-09-28 DIAGNOSIS — E782 Mixed hyperlipidemia: Secondary | ICD-10-CM | POA: Diagnosis not present

## 2017-09-28 DIAGNOSIS — Z7951 Long term (current) use of inhaled steroids: Secondary | ICD-10-CM | POA: Insufficient documentation

## 2017-09-28 DIAGNOSIS — K219 Gastro-esophageal reflux disease without esophagitis: Secondary | ICD-10-CM | POA: Diagnosis not present

## 2017-09-28 DIAGNOSIS — Z791 Long term (current) use of non-steroidal anti-inflammatories (NSAID): Secondary | ICD-10-CM | POA: Diagnosis not present

## 2017-09-28 DIAGNOSIS — Z1211 Encounter for screening for malignant neoplasm of colon: Secondary | ICD-10-CM | POA: Insufficient documentation

## 2017-09-28 DIAGNOSIS — I10 Essential (primary) hypertension: Secondary | ICD-10-CM | POA: Insufficient documentation

## 2017-09-28 DIAGNOSIS — Z7982 Long term (current) use of aspirin: Secondary | ICD-10-CM | POA: Diagnosis not present

## 2017-09-28 DIAGNOSIS — F17210 Nicotine dependence, cigarettes, uncomplicated: Secondary | ICD-10-CM | POA: Diagnosis not present

## 2017-09-28 HISTORY — PX: COLONOSCOPY WITH PROPOFOL: SHX5780

## 2017-09-28 SURGERY — COLONOSCOPY WITH PROPOFOL
Anesthesia: General

## 2017-09-28 MED ORDER — PROPOFOL 10 MG/ML IV BOLUS
INTRAVENOUS | Status: DC | PRN
  Administered 2017-09-28: 50 mg via INTRAVENOUS

## 2017-09-28 MED ORDER — LIDOCAINE HCL (PF) 2 % IJ SOLN
INTRAMUSCULAR | Status: AC
  Filled 2017-09-28: qty 10

## 2017-09-28 MED ORDER — MIDAZOLAM HCL 2 MG/2ML IJ SOLN
INTRAMUSCULAR | Status: AC
  Filled 2017-09-28: qty 2

## 2017-09-28 MED ORDER — PROPOFOL 500 MG/50ML IV EMUL
INTRAVENOUS | Status: DC | PRN
  Administered 2017-09-28: 120 ug/kg/min via INTRAVENOUS

## 2017-09-28 MED ORDER — SODIUM CHLORIDE 0.9 % IV SOLN
INTRAVENOUS | Status: DC
  Administered 2017-09-28: 1000 mL via INTRAVENOUS

## 2017-09-28 MED ORDER — MIDAZOLAM HCL 2 MG/2ML IJ SOLN
INTRAMUSCULAR | Status: DC | PRN
  Administered 2017-09-28: 2 mg via INTRAVENOUS

## 2017-09-28 MED ORDER — LIDOCAINE HCL (PF) 1 % IJ SOLN
INTRAMUSCULAR | Status: AC
  Administered 2017-09-28: 0.3 mL
  Filled 2017-09-28: qty 2

## 2017-09-28 NOTE — Anesthesia Post-op Follow-up Note (Signed)
Anesthesia QCDR form completed.        

## 2017-09-28 NOTE — Op Note (Signed)
[facility] Patient Name: Elijah Mcfarland Procedure Date: 09/28/2017 1:10 PM MRN: [id number] Account #: [id number] Date of Birth: 1950-03-31 Admit Type: Outpatient Age: 67 Room: [facility] ENDO ROOM 1 Gender: Male Note Status: Finalized Procedure:            Colonoscopy Indications:          Screening for colorectal malignant neoplasm Providers:            [name] MD, MD Referring MD:         No Local Md, MD (Referring MD) Medicines:            Monitored Anesthesia Care Complications:        No immediate complications. Procedure:            Pre-Anesthesia Assessment:                       - Prior to the procedure, a History and Physical was                        performed, and patient medications, allergies and                        sensitivities were reviewed. The patient's tolerance of                        previous anesthesia was reviewed.                       - The risks and benefits of the procedure and the                        sedation options and risks were discussed with the                        patient. All questions were answered and informed                        consent was obtained.                       - ASA Grade Assessment: II - A patient with mild                        systemic disease.                       After obtaining informed consent, the colonoscope was                        passed under direct vision. Throughout the procedure,                        the patient's blood pressure, pulse, and oxygen                        saturations were monitored continuously. The                        Colonoscope was introduced through the anus and  advanced to the the cecum, identified by the                        appendiceal orifice, IC valve and transillumination.                        The colonoscopy was performed with ease. The patient                        tolerated the procedure well. The quality  of the bowel                        preparation was poor. Findings:      The perianal and digital rectal examinations were normal.      A large amount of liquid stool was found in the entire colon,       interfering with visualization. Impression:           - Preparation of the colon was poor.                       - Stool in the entire examined colon.                       - No specimens collected. Recommendation:       - Discharge patient to home (with escort).                       - Resume previous diet.                       - Continue present medications.                       - Repeat colonoscopy in 2 weeks because the bowel                        preparation was suboptimal. Procedure Code(s):    --- Professional ---                       [phone], Colonoscopy, flexible; diagnostic, including                        collection of specimen(s) by brushing or washing, when                        performed (separate procedure) Diagnosis Code(s):    --- Professional ---                       Z12.11, Encounter for screening for malignant neoplasm                        of colon CPT copyright 2017 American Medical Association. All rights reserved. The codes documented in this report are preliminary and upon coder review may  be revised to meet current compliance requirements. [name], MD [name] MD, MD 09/28/2017 1:31:21 PM This report has been signed electronically. Number of Addenda: 0 Note Initiated On: 09/28/2017 1:10 PM Total Procedure Duration: 0 hours 10 minutes 25 seconds       [facility]

## 2017-09-28 NOTE — Transfer of Care (Signed)
Immediate Anesthesia Transfer of Care Note  Patient: Elijah Mcfarland  Procedure(s) Performed: COLONOSCOPY WITH PROPOFOL (N/A )  Patient Location: PACU  Anesthesia Type:General  Level of Consciousness: sedated  Airway & Oxygen Therapy: Patient Spontanous Breathing and Patient connected to nasal cannula oxygen  Post-op Assessment: Report given to RN and Post -op Vital signs reviewed and stable  Post vital signs: Reviewed and stable  Last Vitals:  Vitals Value Taken Time  BP 89/62 09/28/2017  1:35 PM  Temp    Pulse 56 09/28/2017  1:36 PM  Resp 12 09/28/2017  1:36 PM  SpO2 100 % 09/28/2017  1:36 PM  Vitals shown include unvalidated device data.  Last Pain:  Vitals:   09/28/17 1210  TempSrc: Tympanic  PainSc: 0-No pain         Complications: No apparent anesthesia complications

## 2017-09-28 NOTE — Anesthesia Procedure Notes (Signed)
Performed by: Demetrius Charity, CRNA Pre-anesthesia Checklist: Patient identified, Emergency Drugs available, Suction available, Timeout performed and Patient being monitored Patient Re-evaluated:Patient Re-evaluated prior to induction Oxygen Delivery Method: Nasal cannula Induction Type: IV induction

## 2017-09-28 NOTE — Anesthesia Postprocedure Evaluation (Signed)
Anesthesia Post Note  Patient: Landry Kamath  Procedure(s) Performed: COLONOSCOPY WITH PROPOFOL (N/A )  Patient location during evaluation: Endoscopy Anesthesia Type: General Level of consciousness: awake and alert Pain management: pain level controlled Vital Signs Assessment: post-procedure vital signs reviewed and stable Respiratory status: spontaneous breathing, nonlabored ventilation, respiratory function stable and patient connected to nasal cannula oxygen Cardiovascular status: blood pressure returned to baseline and stable Postop Assessment: no apparent nausea or vomiting Anesthetic complications: no     Last Vitals:  Vitals:   09/28/17 1355 09/28/17 1405  BP: 123/86 126/76  Pulse: (!) 59   Resp: 13 12  Temp:    SpO2: 99% 98%    Last Pain:  Vitals:   09/28/17 1405  TempSrc:   PainSc: 0-No pain                 Martha Clan

## 2017-09-28 NOTE — Anesthesia Preprocedure Evaluation (Signed)
Anesthesia Evaluation  Patient identified by MRN, date of birth, ID band Patient awake    Reviewed: Allergy & Precautions, H&P , NPO status , Patient's Chart, lab work & pertinent test results, reviewed documented beta blocker date and time   Airway Mallampati: II  TM Distance: >3 FB Neck ROM: full    Dental  (+) Upper Dentures, Lower Dentures, Dental Advidsory Given   Pulmonary neg pulmonary ROS, shortness of breath and with exertion, neg sleep apnea, COPD,  COPD inhaler, neg recent URI, Current Smoker,           Cardiovascular Exercise Tolerance: Good hypertension, (-) angina(-) CAD, (-) Past MI, (-) Cardiac Stents and (-) CABG (-) dysrhythmias (-) Valvular Problems/Murmurs     Neuro/Psych Seizures -, Well Controlled,  PSYCHIATRIC DISORDERS Anxiety    GI/Hepatic Neg liver ROS, GERD  ,  Endo/Other  negative endocrine ROS  Renal/GU negative Renal ROS  negative genitourinary   Musculoskeletal   Abdominal   Peds  Hematology negative hematology ROS (+)   Anesthesia Other Findings Past Medical History: No date: Chronic pain syndrome     Comment:  Left knee No date: COPD (chronic obstructive pulmonary disease) ([place]) 11/2008: DDD (degenerative disc disease), lumbar     Comment:  L5-S1 on plain film No date: Emphysema lung (HCC) No date: GAD (generalized anxiety disorder) No date: GERD (gastroesophageal reflux disease) 07/2010: History of multiple pulmonary nodules     Comment:  Follow up CT scans showed resolution by 03/2011--NO               MALIGNANCY. 06/2010: History of pneumonia 01/07/2014: HTN (hypertension) No date: Osteoarthritis of left knee     Comment:  + past injury of this knee--tibia fracture?  +left leg               shorter than right No date: Seizure disorder [facility])     Comment:  3 total seizures (first was 1995); neuro eval done and               recommended lifetime phenytoin (he had a seizure after               coming off the med in the late 90s No date: Tobacco dependence   Reproductive/Obstetrics negative OB ROS                             Anesthesia Physical Anesthesia Plan  ASA: III  Anesthesia Plan: General   Post-op Pain Management:    Induction: Intravenous  PONV Risk Score and Plan: 1 and Propofol infusion and TIVA  Airway Management Planned: Nasal Cannula and Natural Airway  Additional Equipment:   Intra-op Plan:   Post-operative Plan:   Informed Consent: I have reviewed the patients History and Physical, chart, labs and discussed the procedure including the risks, benefits and alternatives for the proposed anesthesia with the patient or authorized representative who has indicated his/her understanding and acceptance.   Dental Advisory Given  Plan Discussed with: Anesthesiologist, CRNA and Surgeon  Anesthesia Plan Comments:         Anesthesia Quick Evaluation

## 2017-09-28 NOTE — H&P (Signed)
[name], MD [address] [place], [place], [place], [address] [address], [place], [place], [place], [address] Phone: [phone]  Fax: [phone]  Primary Care Physician:  [name], NP   Pre-Procedure History & Physical: HPI:  Elijah Mcfarland is a 67 y.o. male is here for an colonoscopy.   Past Medical History:  Diagnosis Date  . Chronic pain syndrome    Left knee  . COPD (chronic obstructive pulmonary disease) ([place])   . DDD (degenerative disc disease), lumbar 11/2008   L5-S1 on plain film  . Emphysema lung ([place])   . GAD (generalized anxiety disorder)   . GERD (gastroesophageal reflux disease)   . History of multiple pulmonary nodules 07/2010   Follow up CT scans showed resolution by 03/2011--NO MALIGNANCY.  Marland Kitchen History of pneumonia 06/2010  . HTN (hypertension) 01/07/2014  . Osteoarthritis of left knee    + past injury of this knee--tibia fracture?  +left leg shorter than right  . Seizure disorder ([place])    3 total seizures (first was 1995); neuro eval done and recommended lifetime phenytoin (he had a seizure after coming off the med in the late 90s  . Tobacco dependence     Past Surgical History:  Procedure Laterality Date  . COLONOSCOPY  2010   At [facility]; normal per pt  . KNEE SURGERY     Left : x 2    Prior to Admission medications   Medication Sig Start Date End Date Taking? Authorizing Provider  albuterol (PROVENTIL HFA;VENTOLIN HFA) 108 (90 Base) MCG/ACT inhaler Inhale 1-2 puffs into the lungs every 6 (six) hours as needed for wheezing or shortness of breath. 03/19/16  Yes [name], NP  ALPRAZolam [name]) 0.5 MG tablet Take 1 tablet (0.5 mg total) by mouth 2 (two) times daily as needed. 09/26/17   [name], NP  aspirin 81 MG tablet Take 81 mg by mouth daily.    [provider]  atorvastatin (LIPITOR) 10 MG tablet TAKE 1 TABLET EVERY DAY AT 6 PM. 09/20/17   [name], NP  citalopram (CELEXA) 40 MG tablet TAKE 1 TABLET  EVERY DAY 09/20/17   [name], NP  diphenhydramine-acetaminophen (TYLENOL PM) 25-500 MG TABS tablet Take 1 tablet by mouth at bedtime as needed.    [provider]  Fluticasone-Salmeterol (ADVAIR) 500-50 MCG/DOSE AEPB Inhale 1 puff into the lungs 2 (two) times daily. 03/19/16   [name], NP  guaiFENesin (COUGH SYRUP PO) Take by mouth as needed.    [provider]  losartan (COZAAR) 50 MG tablet TAKE 1 TABLET EVERY DAY 09/20/17   [name], NP  meloxicam (MOBIC) 15 MG tablet TAKE 1 TABLET EVERY DAY 09/20/17   [name], NP  neomycin-polymyxin b-dexamethasone (MAXITROL) 3.5-10000-0.1 SUSP  08/04/16   [provider]  phenytoin (DILANTIN) 100 MG ER capsule TAKE 3 CAPSULES EVERY DAY 09/20/17   [name], NP  sildenafil (VIAGRA) 25 MG tablet Take 1 tablet (25 mg total) by mouth daily as needed for erectile dysfunction. 08/17/16   [name], NP  Spacer/Aero-Holding Chambers (AEROCHAMBER PLUS FLO-VU) MISC 1 Units by Does not apply route as directed. 11/03/12   Doran Heater, MD    Allergies as of 09/08/2017  . (No Known Allergies)    Family History  Problem Relation Age of Onset  . Heart disease Mother   . Heart disease Father   . Cancer Sister   .  Stroke Neg Hx     Social History   Socioeconomic History  . Marital status: Married    Spouse name: Not on file  . Number of children: Not on file  . Years of education: Not on file  . Highest education level: Not on file  Occupational History  . Not on file  Social Needs  . Financial resource strain: Not on file  . Food insecurity:    Worry: Not on file    Inability: Not on file  . Transportation needs:    Medical: Not on file    Non-medical: Not on file  Tobacco Use  . Smoking status: Current Every Day Smoker    Packs/day: 1.00    Years: 45.00    Pack years: 45.00    Types: Cigarettes  . Smokeless tobacco: Never Used  Substance and Sexual Activity  . Alcohol use: No     Alcohol/week: 0.0 standard drinks  . Drug use: No  . Sexual activity: Yes    Partners: Male  Lifestyle  . Physical activity:    Days per week: Not on file    Minutes per session: Not on file  . Stress: Not on file  Relationships  . Social connections:    Talks on phone: Not on file    Gets together: Not on file    Attends religious service: Not on file    Active member of club or organization: Not on file    Attends meetings of clubs or organizations: Not on file    Relationship status: Not on file  . Intimate partner violence:    Fear of current or ex partner: Not on file    Emotionally abused: Not on file    Physically abused: Not on file    Forced sexual activity: Not on file  Other Topics Concern  . Not on file  Social History Narrative   Married, 2 grown children   Eighth grade education.  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   Originally from [place].   Tobacco 100 pack-yr hx, still smoking as of 10/2011 (1 pack per day).   Denies alcohol or drug use.   No exercise.    Review of Systems: See HPI, otherwise negative ROS  Physical Exam: BP 122/81   Pulse 64   Temp (!) 96.6 F (35.9 C) (Tympanic)   Resp 17   Ht 5\' 8"  (1.727 m)   Wt 56.7 kg   SpO2 100%   BMI 19.01 kg/m  General:   Alert,  pleasant and cooperative in NAD Head:  Normocephalic and atraumatic. Neck:  Supple; no masses or thyromegaly. Lungs:  Clear throughout to auscultation, normal respiratory effort.    Heart:  +S1, +S2, Regular rate and rhythm, No edema. Abdomen:  Soft, nontender and nondistended. Normal bowel sounds, without guarding, and without rebound.   Neurologic:  Alert and  oriented x4;  grossly normal neurologically.  Impression/Plan: Elijah Mcfarland is here for an colonoscopy to be performed for Screening colonoscopy average risk   Risks, benefits, limitations, and alternatives regarding  colonoscopy have been reviewed with the patient.  Questions have been  answered.  All parties agreeable.   [name], MD  09/28/2017, 12:51 PM

## 2017-09-29 ENCOUNTER — Other Ambulatory Visit: Payer: Self-pay

## 2017-09-29 DIAGNOSIS — Z1211 Encounter for screening for malignant neoplasm of colon: Secondary | ICD-10-CM

## 2017-10-03 ENCOUNTER — Encounter: Payer: Self-pay | Admitting: Gastroenterology

## 2017-10-13 ENCOUNTER — Ambulatory Visit
Admission: RE | Admit: 2017-10-13 | Discharge: 2017-10-13 | Disposition: A | Discharge status: Home / Self Care | Payer: Medicare HMO | Source: Ambulatory Visit | Attending: Gastroenterology | Admitting: Gastroenterology

## 2017-10-13 ENCOUNTER — Encounter
Admission: RE | Disposition: A | Discharge status: Home / Self Care | Payer: Self-pay | Source: Ambulatory Visit | Attending: Gastroenterology

## 2017-10-13 ENCOUNTER — Ambulatory Visit: Payer: Medicare HMO | Admitting: Anesthesiology

## 2017-10-13 ENCOUNTER — Encounter: Payer: Self-pay | Admitting: Anesthesiology

## 2017-10-13 DIAGNOSIS — F17210 Nicotine dependence, cigarettes, uncomplicated: Secondary | ICD-10-CM | POA: Insufficient documentation

## 2017-10-13 DIAGNOSIS — D123 Benign neoplasm of transverse colon: Secondary | ICD-10-CM | POA: Diagnosis not present

## 2017-10-13 DIAGNOSIS — Z7951 Long term (current) use of inhaled steroids: Secondary | ICD-10-CM | POA: Diagnosis not present

## 2017-10-13 DIAGNOSIS — M1732 Unilateral post-traumatic osteoarthritis, left knee: Secondary | ICD-10-CM | POA: Diagnosis not present

## 2017-10-13 DIAGNOSIS — E782 Mixed hyperlipidemia: Secondary | ICD-10-CM | POA: Diagnosis not present

## 2017-10-13 DIAGNOSIS — K621 Rectal polyp: Secondary | ICD-10-CM | POA: Insufficient documentation

## 2017-10-13 DIAGNOSIS — Z791 Long term (current) use of non-steroidal anti-inflammatories (NSAID): Secondary | ICD-10-CM | POA: Insufficient documentation

## 2017-10-13 DIAGNOSIS — K635 Polyp of colon: Secondary | ICD-10-CM | POA: Diagnosis not present

## 2017-10-13 DIAGNOSIS — J439 Emphysema, unspecified: Secondary | ICD-10-CM | POA: Diagnosis not present

## 2017-10-13 DIAGNOSIS — F411 Generalized anxiety disorder: Secondary | ICD-10-CM | POA: Insufficient documentation

## 2017-10-13 DIAGNOSIS — D128 Benign neoplasm of rectum: Secondary | ICD-10-CM | POA: Diagnosis not present

## 2017-10-13 DIAGNOSIS — G894 Chronic pain syndrome: Secondary | ICD-10-CM | POA: Insufficient documentation

## 2017-10-13 DIAGNOSIS — G40909 Epilepsy, unspecified, not intractable, without status epilepticus: Secondary | ICD-10-CM | POA: Diagnosis not present

## 2017-10-13 DIAGNOSIS — K644 Residual hemorrhoidal skin tags: Secondary | ICD-10-CM | POA: Insufficient documentation

## 2017-10-13 DIAGNOSIS — I10 Essential (primary) hypertension: Secondary | ICD-10-CM | POA: Insufficient documentation

## 2017-10-13 DIAGNOSIS — Z7982 Long term (current) use of aspirin: Secondary | ICD-10-CM | POA: Insufficient documentation

## 2017-10-13 DIAGNOSIS — Z79899 Other long term (current) drug therapy: Secondary | ICD-10-CM | POA: Insufficient documentation

## 2017-10-13 DIAGNOSIS — Z1211 Encounter for screening for malignant neoplasm of colon: Secondary | ICD-10-CM | POA: Diagnosis not present

## 2017-10-13 DIAGNOSIS — D126 Benign neoplasm of colon, unspecified: Secondary | ICD-10-CM | POA: Diagnosis not present

## 2017-10-13 HISTORY — PX: COLONOSCOPY WITH PROPOFOL: SHX5780

## 2017-10-13 HISTORY — DX: Unspecified convulsions: R56.9

## 2017-10-13 LAB — URINE DRUG SCREEN, QUALITATIVE (ARMC ONLY)
Amphetamines, Ur Screen: NOT DETECTED
Barbiturates, Ur Screen: NOT DETECTED
CANNABINOID 50 NG, UR ~~LOC~~: POSITIVE — AB
Cocaine Metabolite,Ur ~~LOC~~: NOT DETECTED
MDMA (Ecstasy)Ur Screen: NOT DETECTED
Methadone Scn, Ur: NOT DETECTED
Opiate, Ur Screen: NOT DETECTED
Phencyclidine (PCP) Ur S: NOT DETECTED
Tricyclic, Ur Screen: NOT DETECTED

## 2017-10-13 SURGERY — COLONOSCOPY WITH PROPOFOL
Anesthesia: General

## 2017-10-13 MED ORDER — EPHEDRINE SULFATE 50 MG/ML IJ SOLN
INTRAMUSCULAR | Status: DC | PRN
  Administered 2017-10-13 (×2): 10 mg via INTRAVENOUS

## 2017-10-13 MED ORDER — FENTANYL CITRATE (PF) 100 MCG/2ML IJ SOLN
INTRAMUSCULAR | Status: AC
  Filled 2017-10-13: qty 2

## 2017-10-13 MED ORDER — PROPOFOL 500 MG/50ML IV EMUL
INTRAVENOUS | Status: AC
  Filled 2017-10-13: qty 50

## 2017-10-13 MED ORDER — EPHEDRINE SULFATE 50 MG/ML IJ SOLN
INTRAMUSCULAR | Status: AC
  Filled 2017-10-13: qty 1

## 2017-10-13 MED ORDER — SODIUM CHLORIDE 0.9 % IV SOLN
INTRAVENOUS | Status: DC
  Administered 2017-10-13: 08:00:00 via INTRAVENOUS

## 2017-10-13 MED ORDER — FENTANYL CITRATE (PF) 100 MCG/2ML IJ SOLN
INTRAMUSCULAR | Status: DC | PRN
  Administered 2017-10-13: 50 ug via INTRAVENOUS

## 2017-10-13 MED ORDER — MIDAZOLAM HCL 2 MG/2ML IJ SOLN
INTRAMUSCULAR | Status: AC
  Filled 2017-10-13: qty 2

## 2017-10-13 MED ORDER — PROPOFOL 500 MG/50ML IV EMUL
INTRAVENOUS | Status: DC | PRN
  Administered 2017-10-13: 120 ug/kg/min via INTRAVENOUS

## 2017-10-13 MED ORDER — MIDAZOLAM HCL 2 MG/2ML IJ SOLN
INTRAMUSCULAR | Status: DC | PRN
  Administered 2017-10-13: 2 mg via INTRAVENOUS

## 2017-10-13 NOTE — Anesthesia Procedure Notes (Signed)
Performed by: Cook-Martin, Zainah Steven Pre-anesthesia Checklist: Patient identified, Emergency Drugs available, Suction available, Patient being monitored and Timeout performed Patient Re-evaluated:Patient Re-evaluated prior to induction Oxygen Delivery Method: Nasal cannula Preoxygenation: Pre-oxygenation with 100% oxygen Induction Type: IV induction Placement Confirmation: positive ETCO2 and CO2 detector       

## 2017-10-13 NOTE — Anesthesia Preprocedure Evaluation (Signed)
Anesthesia Evaluation  Patient identified by MRN, date of birth, ID band Patient awake    Reviewed: Allergy & Precautions, NPO status , Patient's Chart, lab work & pertinent test results  History of Anesthesia Complications Negative for: history of anesthetic complications  Airway Mallampati: II       Dental  (+) Upper Dentures, Lower Dentures   Pulmonary neg sleep apnea, COPD,  COPD inhaler, Current Smoker,           Cardiovascular hypertension, Pt. on medications (-) Past MI and (-) CHF (-) dysrhythmias (-) Valvular Problems/Murmurs     Neuro/Psych Seizures - (none for 4 yrs), Well Controlled,  Anxiety    GI/Hepatic Neg liver ROS, neg GERD  ,  Endo/Other  neg diabetes  Renal/GU negative Renal ROS     Musculoskeletal   Abdominal   Peds  Hematology   Anesthesia Other Findings   Reproductive/Obstetrics                             Anesthesia Physical Anesthesia Plan  ASA: III  Anesthesia Plan: General   Post-op Pain Management:    Induction: Intravenous  PONV Risk Score and Plan: 1 and Propofol infusion and TIVA  Airway Management Planned: Nasal Cannula  Additional Equipment:   Intra-op Plan:   Post-operative Plan:   Informed Consent: I have reviewed the patients History and Physical, chart, labs and discussed the procedure including the risks, benefits and alternatives for the proposed anesthesia with the patient or authorized representative who has indicated his/her understanding and acceptance.     Plan Discussed with:   Anesthesia Plan Comments:         Anesthesia Quick Evaluation

## 2017-10-13 NOTE — Op Note (Signed)
[facility] Patient Name: Elijah Mcfarland Procedure Date: 10/13/2017 7:33 AM MRN: [id number] Account #: [id number] Date of Birth: 1950/08/07 Admit Type: Outpatient Age: 67 Room: [facility] ENDO ROOM 1 Gender: Male Note Status: Finalized Procedure:            Colonoscopy Indications:          Screening for colorectal malignant neoplasm, Last                        colonoscopy: August 2019 Providers:            Lin Landsman MD, MD Referring MD:         [name] (Referring MD) Medicines:            Monitored Anesthesia Care Complications:        No immediate complications. Estimated blood loss: None. Procedure:            Pre-Anesthesia Assessment:                       - Prior to the procedure, a History and Physical was                        performed, and patient medications and allergies were                        reviewed. The patient is competent. The risks and                        benefits of the procedure and the sedation options and                        risks were discussed with the patient. All questions                        were answered and informed consent was obtained.                        Patient identification and proposed procedure were                        verified by the physician, the nurse, the                        anesthesiologist, the anesthetist and the technician in                        the pre-procedure area in the procedure room in the                        endoscopy suite. Mental Status Examination: alert and                        oriented. Airway Examination: normal oropharyngeal                        airway and neck mobility. Respiratory Examination:                        clear to auscultation. CV Examination: normal.  Prophylactic Antibiotics: The patient does not require                        prophylactic antibiotics. Prior Anticoagulants: The                         patient has taken no previous anticoagulant or                        antiplatelet agents. ASA Grade Assessment: III - A                        patient with severe systemic disease. After reviewing                        the risks and benefits, the patient was deemed in                        satisfactory condition to undergo the procedure. The                        anesthesia plan was to use monitored anesthesia care                        (MAC). Immediately prior to administration of                        medications, the patient was re-assessed for adequacy                        to receive sedatives. The heart rate, respiratory rate,                        oxygen saturations, blood pressure, adequacy of                        pulmonary ventilation, and response to care were                        monitored throughout the procedure. The physical status                        of the patient was re-assessed after the procedure.                       After obtaining informed consent, the colonoscope was                        passed under direct vision. Throughout the procedure,                        the patient's blood pressure, pulse, and oxygen                        saturations were monitored continuously. The                        Colonoscope was introduced through the anus and  advanced to the the cecum, identified by appendiceal                        orifice and ileocecal valve. The colonoscopy was                        somewhat difficult due to inadequate bowel prep.                        Successful completion of the procedure was aided by                        lavage. The patient tolerated the procedure well. The                        quality of the bowel preparation was evaluated using                        the BBPS [facility] Bowel Preparation Scale) with scores                        of: Right Colon = 1 (portion of mucosa seen, but other                         areas not well seen due to staining, residual stool                        and/or opaque liquid), Transverse Colon = 2 (minor                        amount of residual staining, small fragments of stool                        and/or opaque liquid, but mucosa seen well) and Left                        Colon = 2 (minor amount of residual staining, small                        fragments of stool and/or opaque liquid, but mucosa                        seen well). The total BBPS score equals 5. The quality                        of the bowel preparation was fair. Findings:      Skin tags were found on perianal exam.      Two sessile polyps were found in the rectum and transverse colon. The       polyps were 4 to 5 mm in size. These polyps were removed with a cold       snare. Resection and retrieval were complete.      External hemorrhoids were found during retroflexion. The hemorrhoids       were medium-sized.      Copious quantities of semi-liquid stool was found in the entire colon,       precluding visualization. Lavage of the area was performed using 200 -  500 mL of sterile water, resulting in clearance with fair visualization. Impression:           - Preparation of the colon was fair.                       - Perianal skin tags found on perianal exam.                       - Two 4 to 5 mm polyps in the rectum and in the                        transverse colon, removed with a cold snare. Resected                        and retrieved.                       - External hemorrhoids.                       - Stool in the entire examined colon. Recommendation:       - Discharge patient to home (with escort).                       - Resume previous diet today.                       - Continue present medications.                       - Await pathology results.                       - Repeat colonoscopy in 1 year for surveillance of                        multiple  polyps. Procedure Code(s):    --- Professional ---                       [phone], Colonoscopy, flexible; with removal of tumor(s),                        polyp(s), or other lesion(s) by snare technique Diagnosis Code(s):    --- Professional ---                       K64.4, Residual hemorrhoidal skin tags                       Z12.11, Encounter for screening for malignant neoplasm                        of colon                       K62.1, Rectal polyp                       D12.3, Benign neoplasm of transverse colon (hepatic                        flexure or splenic flexure) CPT copyright 2017 American Medical Association. All rights reserved.  The codes documented in this report are preliminary and upon coder review may  be revised to meet current compliance requirements. Dr. Ulyess Mort Lin Landsman MD, MD 10/13/2017 8:54:34 AM This report has been signed electronically. Number of Addenda: 0 Note Initiated On: 10/13/2017 7:33 AM Scope Withdrawal Time: 0 hours 19 minutes 15 seconds  Total Procedure Duration: 0 hours 25 minutes 10 seconds       [facility]

## 2017-10-13 NOTE — Anesthesia Postprocedure Evaluation (Deleted)
Anesthesia Post Note  Patient: Elijah Mcfarland  Procedure(s) Performed: COLONOSCOPY WITH PROPOFOL (N/A )  Patient location during evaluation: PACU Anesthesia Type: General Level of consciousness: awake and alert Pain management: pain level controlled Vital Signs Assessment: post-procedure vital signs reviewed and stable Respiratory status: spontaneous breathing and respiratory function stable Cardiovascular status: stable Anesthetic complications: no     Last Vitals:  Vitals:   10/13/17 0730 10/13/17 0850  BP: (!) 149/101 109/74  Pulse: 80 86  Resp: 16 16  Temp:  (!) 36 C  SpO2: 100% 98%    Last Pain:  Vitals:   10/13/17 0850  TempSrc: Tympanic  PainSc:                  Calistro Rauf K

## 2017-10-13 NOTE — Transfer of Care (Signed)
Immediate Anesthesia Transfer of Care Note  Patient: Elijah Mcfarland  Procedure(s) Performed: COLONOSCOPY WITH PROPOFOL (N/A )  Patient Location: PACU  Anesthesia Type:General  Level of Consciousness: awake and oriented  Airway & Oxygen Therapy: Patient Spontanous Breathing and Patient connected to nasal cannula oxygen  Post-op Assessment: Report given to RN and Post -op Vital signs reviewed and stable  Post vital signs: Reviewed and stable  Last Vitals:  Vitals Value Taken Time  BP    Temp    Pulse    Resp    SpO2      Last Pain:  Vitals:   10/13/17 0730  TempSrc: Tympanic  PainSc: 0-No pain         Complications: No apparent anesthesia complications

## 2017-10-13 NOTE — Anesthesia Post-op Follow-up Note (Signed)
Anesthesia QCDR form completed.        

## 2017-10-13 NOTE — Anesthesia Postprocedure Evaluation (Signed)
Anesthesia Post Note  Patient: Elijah Mcfarland  Procedure(s) Performed: COLONOSCOPY WITH PROPOFOL (N/A )  Patient location during evaluation: Endoscopy Anesthesia Type: General Level of consciousness: awake and alert Pain management: pain level controlled Vital Signs Assessment: post-procedure vital signs reviewed and stable Respiratory status: spontaneous breathing and respiratory function stable Cardiovascular status: stable Anesthetic complications: no     Last Vitals:  Vitals:   10/13/17 0730 10/13/17 0850  BP: (!) 149/101 109/74  Pulse: 80 86  Resp: 16 16  Temp:  (!) 36 C  SpO2: 100% 98%    Last Pain:  Vitals:   10/13/17 0850  TempSrc: Tympanic  PainSc:                  Kishan Wachsmuth K

## 2017-10-13 NOTE — H&P (Signed)
Cephas Darby, MD [address]  [place], [place] [address]  Main: [phone]  Fax: [phone] Pager: [phone]  Primary Care Physician:  [name], NP Primary Gastroenterologist:  Dr. Cephas Darby  Pre-Procedure History & Physical: HPI:  Cayden Granholm is a 67 y.o. male is here for an colonoscopy.   Past Medical History:  Diagnosis Date  . Chronic pain syndrome    Left knee  . COPD (chronic obstructive pulmonary disease) ([place])   . DDD (degenerative disc disease), lumbar 11/2008   L5-S1 on plain film  . Emphysema lung ([place])   . GAD (generalized anxiety disorder)   . GERD (gastroesophageal reflux disease)   . History of multiple pulmonary nodules 07/2010   Follow up CT scans showed resolution by 03/2011--NO MALIGNANCY.  Marland Kitchen History of pneumonia 06/2010  . HTN (hypertension) 01/07/2014  . Osteoarthritis of left knee    + past injury of this knee--tibia fracture?  +left leg shorter than right  . Seizure ([place])   . Seizure disorder ([place])    3 total seizures (first was 1995); neuro eval done and recommended lifetime phenytoin (he had a seizure after coming off the med in the late 90s  . Tobacco dependence     Past Surgical History:  Procedure Laterality Date  . COLONOSCOPY  2010   At [facility]; normal per pt  . COLONOSCOPY WITH PROPOFOL N/A 09/28/2017   Procedure: COLONOSCOPY WITH PROPOFOL;  Surgeon: [name], MD;  Location: [facility] ENDOSCOPY;  Service: Gastroenterology;  Laterality: N/A;  . KNEE SURGERY     Left : x 2    Prior to Admission medications   Medication Sig Start Date End Date Taking? Authorizing Provider  albuterol (PROVENTIL HFA;VENTOLIN HFA) 108 (90 Base) MCG/ACT inhaler Inhale 1-2 puffs into the lungs every 6 (six) hours as needed for wheezing or shortness of breath. 03/19/16  Yes [name], NP  ALPRAZolam [name]) 0.5 MG tablet Take 1 tablet (0.5 mg total) by mouth 2 (two) times daily as needed. 09/26/17  Yes [name], NP    aspirin 81 MG tablet Take 81 mg by mouth daily.   Yes [provider]  atorvastatin (LIPITOR) 10 MG tablet TAKE 1 TABLET EVERY DAY AT 6 PM. 09/20/17  Yes Baity, Coralie Keens, NP  citalopram (CELEXA) 40 MG tablet TAKE 1 TABLET EVERY DAY 09/20/17  Yes Baity, Coralie Keens, NP  Fluticasone-Salmeterol (ADVAIR) 500-50 MCG/DOSE AEPB Inhale 1 puff into the lungs 2 (two) times daily. 03/19/16  Yes [name], NP  losartan (COZAAR) 50 MG tablet TAKE 1 TABLET EVERY DAY 09/20/17  Yes [name], NP  meloxicam (MOBIC) 15 MG tablet TAKE 1 TABLET EVERY DAY 09/20/17  Yes [name], NP  phenytoin (DILANTIN) 100 MG ER capsule TAKE 3 CAPSULES EVERY DAY 09/20/17  Yes Baity, Coralie Keens, NP  diphenhydramine-acetaminophen (TYLENOL PM) 25-500 MG TABS tablet Take 1 tablet by mouth at bedtime as needed.    [provider]  guaiFENesin (COUGH SYRUP PO) Take by mouth as needed.    [provider]  neomycin-polymyxin b-dexamethasone (MAXITROL) 3.5-10000-0.1 SUSP  08/04/16   [provider]  sildenafil (VIAGRA) 25 MG tablet Take 1 tablet (25 mg total) by mouth daily as needed for erectile dysfunction. Patient not taking: Reported on 10/13/2017 08/17/16   [name], NP  Spacer/Aero-Holding Chambers (AEROCHAMBER PLUS FLO-VU) MISC 1 Units by Does not apply route as directed. 11/03/12   Doran Heater,  MD    Allergies as of 09/29/2017  . (No Known Allergies)    Family History  Problem Relation Age of Onset  . Heart disease Mother   . Heart disease Father   . Cancer Sister   . Stroke Neg Hx     Social History   Socioeconomic History  . Marital status: Married    Spouse name: Not on file  . Number of children: Not on file  . Years of education: Not on file  . Highest education level: Not on file  Occupational History  . Not on file  Social Needs  . Financial resource strain: Not on file  . Food insecurity:    Worry: Not on file    Inability: Not on file  . Transportation  needs:    Medical: Not on file    Non-medical: Not on file  Tobacco Use  . Smoking status: Current Every Day Smoker    Packs/day: 1.00    Years: 45.00    Pack years: 45.00    Types: Cigarettes  . Smokeless tobacco: Never Used  Substance and Sexual Activity  . Alcohol use: No    Alcohol/week: 0.0 standard drinks  . Drug use: Yes    Types: Marijuana    Comment: 2 month  . Sexual activity: Yes    Partners: Male  Lifestyle  . Physical activity:    Days per week: Not on file    Minutes per session: Not on file  . Stress: Not on file  Relationships  . Social connections:    Talks on phone: Not on file    Gets together: Not on file    Attends religious service: Not on file    Active member of club or organization: Not on file    Attends meetings of clubs or organizations: Not on file    Relationship status: Not on file  . Intimate partner violence:    Fear of current or ex partner: Not on file    Emotionally abused: Not on file    Physically abused: Not on file    Forced sexual activity: Not on file  Other Topics Concern  . Not on file  Social History Narrative   Married, 2 grown children   Eighth grade education.  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   Originally from [place].   Tobacco 100 pack-yr hx, still smoking as of 10/2011 (1 pack per day).   Denies alcohol or drug use.   No exercise.    Review of Systems: See HPI, otherwise negative ROS  Physical Exam: BP (!) 149/101   Pulse 80   Temp (!) 96.7 F (35.9 C) (Tympanic)   Resp 16   Ht 5\' 8"  (1.727 m)   Wt 54.4 kg   SpO2 100%   BMI 18.25 kg/m  General:   Alert,  pleasant and cooperative in NAD Head:  Normocephalic and atraumatic. Neck:  Supple; no masses or thyromegaly. Lungs:  Clear throughout to auscultation.    Heart:  Regular rate and rhythm. Abdomen:  Soft, nontender and nondistended. Normal bowel sounds, without guarding, and without rebound.   Neurologic:  Alert and   oriented x4;  grossly normal neurologically.  Impression/Plan: Khi Mcmillen is here for a colonoscopy to be performed for colon cancer screening  Risks, benefits, limitations, and alternatives regarding  colonoscopy have been reviewed with the patient.  Questions have been answered.  All parties agreeable.   Sherri Sear, MD  10/13/2017, 8:22  AM

## 2017-10-15 LAB — SURGICAL PATHOLOGY

## 2017-10-18 ENCOUNTER — Encounter: Payer: Self-pay | Admitting: Gastroenterology

## 2017-10-25 ENCOUNTER — Other Ambulatory Visit: Payer: Self-pay | Admitting: Internal Medicine

## 2017-10-25 NOTE — Telephone Encounter (Signed)
Last filled 09/26/17, note to pharmacy [facility] on or after 10/27/17... Please advise

## 2017-11-28 ENCOUNTER — Other Ambulatory Visit: Payer: Self-pay | Admitting: Internal Medicine

## 2017-11-28 NOTE — Telephone Encounter (Signed)
Copied from [place] [id number]. Topic: Quick Communication - Rx Refill/Question >> Nov 28, 2017 12:19 PM Margot Ables wrote: Medication: ALPRAZolam [name]) 0.5 MG tablet  - pt is out since 11/26/17 - he states pharmacy has requested  Last OV with Baity, NP 08/22/17 Last refill 10/27/17 #60 no refills  Has the patient contacted their pharmacy? Yes  Preferred Pharmacy (with phone number or street name): [place], [place] - [address] [phone] (Phone) [phone] (Fax)

## 2017-11-29 MED ORDER — ALPRAZOLAM 0.5 MG PO TABS: 0.5000 mg | ORAL_TABLET | Freq: Two times a day (BID) | ORAL | 0 refills | Status: DC | PRN

## 2017-11-29 NOTE — Telephone Encounter (Signed)
Noted, refill sent to pharmacy. 

## 2018-01-02 ENCOUNTER — Other Ambulatory Visit: Payer: Self-pay | Admitting: Internal Medicine

## 2018-01-02 MED ORDER — ALPRAZOLAM 0.5 MG PO TABS: 0.5000 mg | ORAL_TABLET | Freq: Two times a day (BID) | ORAL | 0 refills | Status: DC | PRN

## 2018-01-02 NOTE — Telephone Encounter (Signed)
° ° ° °  1. Which medications need to be refilled? (please list name of each medication and dose if known) Xanax .5mg   2. Which pharmacy/location (including street and city if local pharmacy) is medication to be sent to?[place] [phone]

## 2018-01-02 NOTE — Telephone Encounter (Signed)
Last filled 11/29/2017.... Please advise

## 2018-01-25 ENCOUNTER — Other Ambulatory Visit: Payer: Self-pay | Admitting: Internal Medicine

## 2018-01-25 NOTE — Telephone Encounter (Signed)
This is not due until 02/01/18, will send refill request to [facility] next week

## 2018-01-30 NOTE — Telephone Encounter (Signed)
[facility] faxed note that pt requesting status of xanax refill; TH note had pt said xanax refill expired on 01/28/18.Please advise.

## 2018-01-31 MED ORDER — ALPRAZOLAM 0.5 MG PO TABS: 0.5000 mg | ORAL_TABLET | Freq: Two times a day (BID) | ORAL | 0 refills | Status: DC | PRN

## 2018-01-31 NOTE — Telephone Encounter (Signed)
Last filled 01/02/2018... please advise... new pharmacy [facility]

## 2018-02-28 ENCOUNTER — Encounter (HOSPITAL_COMMUNITY): Payer: Self-pay

## 2018-02-28 ENCOUNTER — Inpatient Hospital Stay (HOSPITAL_COMMUNITY)
Admission: EM | Admit: 2018-02-28 | Discharge: 2018-03-03 | DRG: 381 | Disposition: A | Discharge status: Home / Self Care | Payer: Medicare HMO | Attending: Internal Medicine | Admitting: Internal Medicine

## 2018-02-28 ENCOUNTER — Other Ambulatory Visit: Payer: Self-pay | Admitting: Internal Medicine

## 2018-02-28 ENCOUNTER — Other Ambulatory Visit: Payer: Self-pay

## 2018-02-28 DIAGNOSIS — M549 Dorsalgia, unspecified: Secondary | ICD-10-CM | POA: Diagnosis present

## 2018-02-28 DIAGNOSIS — G40409 Other generalized epilepsy and epileptic syndromes, not intractable, without status epilepticus: Secondary | ICD-10-CM | POA: Diagnosis present

## 2018-02-28 DIAGNOSIS — Z7982 Long term (current) use of aspirin: Secondary | ICD-10-CM | POA: Diagnosis not present

## 2018-02-28 DIAGNOSIS — F411 Generalized anxiety disorder: Secondary | ICD-10-CM | POA: Diagnosis present

## 2018-02-28 DIAGNOSIS — F17210 Nicotine dependence, cigarettes, uncomplicated: Secondary | ICD-10-CM | POA: Diagnosis present

## 2018-02-28 DIAGNOSIS — G894 Chronic pain syndrome: Secondary | ICD-10-CM | POA: Diagnosis present

## 2018-02-28 DIAGNOSIS — E7800 Pure hypercholesterolemia, unspecified: Secondary | ICD-10-CM | POA: Diagnosis present

## 2018-02-28 DIAGNOSIS — K644 Residual hemorrhoidal skin tags: Secondary | ICD-10-CM | POA: Diagnosis present

## 2018-02-28 DIAGNOSIS — K2960 Other gastritis without bleeding: Secondary | ICD-10-CM | POA: Diagnosis present

## 2018-02-28 DIAGNOSIS — K219 Gastro-esophageal reflux disease without esophagitis: Secondary | ICD-10-CM | POA: Diagnosis present

## 2018-02-28 DIAGNOSIS — Z716 Tobacco abuse counseling: Secondary | ICD-10-CM

## 2018-02-28 DIAGNOSIS — Y929 Unspecified place or not applicable: Secondary | ICD-10-CM | POA: Diagnosis not present

## 2018-02-28 DIAGNOSIS — K922 Gastrointestinal hemorrhage, unspecified: Secondary | ICD-10-CM | POA: Diagnosis not present

## 2018-02-28 DIAGNOSIS — D649 Anemia, unspecified: Secondary | ICD-10-CM

## 2018-02-28 DIAGNOSIS — J449 Chronic obstructive pulmonary disease, unspecified: Secondary | ICD-10-CM | POA: Diagnosis not present

## 2018-02-28 DIAGNOSIS — K319 Disease of stomach and duodenum, unspecified: Secondary | ICD-10-CM | POA: Diagnosis present

## 2018-02-28 DIAGNOSIS — J439 Emphysema, unspecified: Secondary | ICD-10-CM | POA: Diagnosis present

## 2018-02-28 DIAGNOSIS — Z79899 Other long term (current) drug therapy: Secondary | ICD-10-CM

## 2018-02-28 DIAGNOSIS — K920 Hematemesis: Secondary | ICD-10-CM | POA: Diagnosis present

## 2018-02-28 DIAGNOSIS — M1712 Unilateral primary osteoarthritis, left knee: Secondary | ICD-10-CM | POA: Diagnosis present

## 2018-02-28 DIAGNOSIS — K2971 Gastritis, unspecified, with bleeding: Secondary | ICD-10-CM | POA: Diagnosis present

## 2018-02-28 DIAGNOSIS — K2211 Ulcer of esophagus with bleeding: Secondary | ICD-10-CM | POA: Diagnosis present

## 2018-02-28 DIAGNOSIS — R7303 Prediabetes: Secondary | ICD-10-CM | POA: Diagnosis present

## 2018-02-28 DIAGNOSIS — K210 Gastro-esophageal reflux disease with esophagitis: Secondary | ICD-10-CM | POA: Diagnosis present

## 2018-02-28 DIAGNOSIS — I959 Hypotension, unspecified: Secondary | ICD-10-CM | POA: Diagnosis present

## 2018-02-28 DIAGNOSIS — Z791 Long term (current) use of non-steroidal anti-inflammatories (NSAID): Secondary | ICD-10-CM

## 2018-02-28 DIAGNOSIS — I10 Essential (primary) hypertension: Secondary | ICD-10-CM | POA: Diagnosis present

## 2018-02-28 DIAGNOSIS — K449 Diaphragmatic hernia without obstruction or gangrene: Secondary | ICD-10-CM | POA: Diagnosis present

## 2018-02-28 DIAGNOSIS — H3530 Unspecified macular degeneration: Secondary | ICD-10-CM | POA: Diagnosis present

## 2018-02-28 DIAGNOSIS — D62 Acute posthemorrhagic anemia: Secondary | ICD-10-CM | POA: Diagnosis present

## 2018-02-28 DIAGNOSIS — T39395A Adverse effect of other nonsteroidal anti-inflammatory drugs [NSAID], initial encounter: Secondary | ICD-10-CM | POA: Diagnosis present

## 2018-02-28 DIAGNOSIS — Z23 Encounter for immunization: Secondary | ICD-10-CM | POA: Diagnosis present

## 2018-02-28 DIAGNOSIS — R0689 Other abnormalities of breathing: Secondary | ICD-10-CM | POA: Diagnosis not present

## 2018-02-28 DIAGNOSIS — K921 Melena: Secondary | ICD-10-CM | POA: Diagnosis present

## 2018-02-28 DIAGNOSIS — R000 Tachycardia, unspecified: Secondary | ICD-10-CM | POA: Diagnosis present

## 2018-02-28 DIAGNOSIS — G40309 Generalized idiopathic epilepsy and epileptic syndromes, not intractable, without status epilepticus: Secondary | ICD-10-CM | POA: Diagnosis present

## 2018-02-28 DIAGNOSIS — E782 Mixed hyperlipidemia: Secondary | ICD-10-CM | POA: Diagnosis present

## 2018-02-28 DIAGNOSIS — Z720 Tobacco use: Secondary | ICD-10-CM | POA: Diagnosis present

## 2018-02-28 DIAGNOSIS — Z8249 Family history of ischemic heart disease and other diseases of the circulatory system: Secondary | ICD-10-CM | POA: Diagnosis not present

## 2018-02-28 DIAGNOSIS — K2210 Ulcer of esophagus without bleeding: Secondary | ICD-10-CM | POA: Diagnosis not present

## 2018-02-28 DIAGNOSIS — R0902 Hypoxemia: Secondary | ICD-10-CM | POA: Diagnosis not present

## 2018-02-28 DIAGNOSIS — F17200 Nicotine dependence, unspecified, uncomplicated: Secondary | ICD-10-CM | POA: Diagnosis present

## 2018-02-28 DIAGNOSIS — R42 Dizziness and giddiness: Secondary | ICD-10-CM | POA: Diagnosis not present

## 2018-02-28 DIAGNOSIS — K3189 Other diseases of stomach and duodenum: Secondary | ICD-10-CM | POA: Diagnosis not present

## 2018-02-28 DIAGNOSIS — R231 Pallor: Secondary | ICD-10-CM | POA: Diagnosis not present

## 2018-02-28 LAB — CBC
HCT: 23.3 % — ABNORMAL LOW (ref 39.0–52.0)
Hemoglobin: 7.3 g/dL — ABNORMAL LOW (ref 13.0–17.0)
MCH: 31.5 pg (ref 26.0–34.0)
MCHC: 31.3 g/dL (ref 30.0–36.0)
MCV: 100.4 fL — ABNORMAL HIGH (ref 80.0–100.0)
Platelets: 204 10*3/uL (ref 150–400)
RBC: 2.32 MIL/uL — ABNORMAL LOW (ref 4.22–5.81)
RDW: 14.8 % (ref 11.5–15.5)
WBC: 8.4 10*3/uL (ref 4.0–10.5)
nRBC: 0 % (ref 0.0–0.2)

## 2018-02-28 LAB — CBC WITH DIFFERENTIAL/PLATELET
Abs Immature Granulocytes: 0.12 10*3/uL — ABNORMAL HIGH (ref 0.00–0.07)
BASOS ABS: 0.1 10*3/uL (ref 0.0–0.1)
Basophils Relative: 1 %
EOS PCT: 4 %
Eosinophils Absolute: 0.6 10*3/uL — ABNORMAL HIGH (ref 0.0–0.5)
HCT: 29.3 % — ABNORMAL LOW (ref 39.0–52.0)
Hemoglobin: 8.8 g/dL — ABNORMAL LOW (ref 13.0–17.0)
Immature Granulocytes: 1 %
Lymphocytes Relative: 14 %
Lymphs Abs: 1.9 10*3/uL (ref 0.7–4.0)
MCH: 30.6 pg (ref 26.0–34.0)
MCHC: 30 g/dL (ref 30.0–36.0)
MCV: 101.7 fL — ABNORMAL HIGH (ref 80.0–100.0)
Monocytes Absolute: 0.7 10*3/uL (ref 0.1–1.0)
Monocytes Relative: 5 %
NEUTROS ABS: 9.9 10*3/uL — AB (ref 1.7–7.7)
NRBC: 0 % (ref 0.0–0.2)
Neutrophils Relative %: 75 %
Platelets: 290 10*3/uL (ref 150–400)
RBC: 2.88 MIL/uL — AB (ref 4.22–5.81)
RDW: 14.8 % (ref 11.5–15.5)
WBC: 13.2 10*3/uL — AB (ref 4.0–10.5)

## 2018-02-28 LAB — COMPREHENSIVE METABOLIC PANEL
ALBUMIN: 3.2 g/dL — AB (ref 3.5–5.0)
ALT: 18 U/L (ref 0–44)
ANION GAP: 10 (ref 5–15)
AST: 23 U/L (ref 15–41)
Alkaline Phosphatase: 48 U/L (ref 38–126)
BUN: 41 mg/dL — AB (ref 8–23)
CALCIUM: 7.9 mg/dL — AB (ref 8.9–10.3)
CO2: 21 mmol/L — AB (ref 22–32)
Chloride: 108 mmol/L (ref 98–111)
Creatinine, Ser: 0.86 mg/dL (ref 0.61–1.24)
GFR calc Af Amer: >60 mL/min (ref >60)
GFR calc non Af Amer: >60 mL/min (ref >60)
GLUCOSE: 226 mg/dL — AB (ref 70–99)
Potassium: 4.7 mmol/L (ref 3.5–5.1)
SODIUM: 139 mmol/L (ref 135–145)
Total Bilirubin: 0.4 mg/dL (ref 0.3–1.2)
Total Protein: 5.7 g/dL — ABNORMAL LOW (ref 6.5–8.1)

## 2018-02-28 LAB — ABO/RH: ABO/RH(D): O POS

## 2018-02-28 LAB — POC OCCULT BLOOD, ED: Fecal Occult Bld: POSITIVE — AB

## 2018-02-28 LAB — PREPARE RBC (CROSSMATCH)

## 2018-02-28 MED ORDER — PANTOPRAZOLE SODIUM 40 MG IV SOLR
INTRAVENOUS | Status: AC
  Filled 2018-02-28: qty 80

## 2018-02-28 MED ORDER — MOMETASONE FURO-FORMOTEROL FUM 200-5 MCG/ACT IN AERO
2.0000 | INHALATION_SPRAY | Freq: Two times a day (BID) | RESPIRATORY_TRACT | Status: DC
  Administered 2018-03-02 – 2018-03-03 (×3): 2 via RESPIRATORY_TRACT
  Filled 2018-02-28: qty 8.8

## 2018-02-28 MED ORDER — PANTOPRAZOLE SODIUM 40 MG IV SOLR
40.0000 mg | Freq: Once | INTRAVENOUS | Status: AC
  Administered 2018-02-28: 40 mg via INTRAVENOUS
  Filled 2018-02-28: qty 40

## 2018-02-28 MED ORDER — SODIUM CHLORIDE 0.9 % IV SOLN
8.0000 mg/h | INTRAVENOUS | Status: DC
  Administered 2018-03-01 (×3): 8 mg/h via INTRAVENOUS
  Filled 2018-02-28 (×7): qty 80

## 2018-02-28 MED ORDER — ONDANSETRON HCL 4 MG/2ML IJ SOLN: 4.0000 mg | Freq: Four times a day (QID) | INTRAMUSCULAR | Status: DC | PRN

## 2018-02-28 MED ORDER — SODIUM CHLORIDE 0.9 % IV SOLN
INTRAVENOUS | Status: AC
  Administered 2018-03-01: 04:00:00 via INTRAVENOUS

## 2018-02-28 MED ORDER — SODIUM CHLORIDE 0.9 % IV BOLUS
500.0000 mL | Freq: Once | INTRAVENOUS | Status: AC
  Administered 2018-02-28: 500 mL via INTRAVENOUS

## 2018-02-28 MED ORDER — SODIUM CHLORIDE 0.9 % IV SOLN
10.0000 mL/h | Freq: Once | INTRAVENOUS | Status: AC
  Administered 2018-02-28: 10 mL/h via INTRAVENOUS

## 2018-02-28 MED ORDER — PHENYTOIN SODIUM 50 MG/ML IJ SOLN
100.0000 mg | Freq: Three times a day (TID) | INTRAMUSCULAR | Status: DC
  Administered 2018-03-01 – 2018-03-02 (×4): 100 mg via INTRAVENOUS
  Filled 2018-02-28 (×4): qty 2

## 2018-02-28 MED ORDER — ONDANSETRON HCL 4 MG PO TABS: 4.0000 mg | ORAL_TABLET | Freq: Four times a day (QID) | ORAL | Status: DC | PRN

## 2018-02-28 MED ORDER — ALBUTEROL SULFATE HFA 108 (90 BASE) MCG/ACT IN AERS: 1.0000 | INHALATION_SPRAY | Freq: Four times a day (QID) | RESPIRATORY_TRACT | Status: DC | PRN

## 2018-02-28 MED ORDER — MOMETASONE FURO-FORMOTEROL FUM 200-5 MCG/ACT IN AERO
2.0000 | INHALATION_SPRAY | Freq: Two times a day (BID) | RESPIRATORY_TRACT | Status: DC
  Administered 2018-02-28: 2 via RESPIRATORY_TRACT
  Filled 2018-02-28 (×2): qty 8.8

## 2018-02-28 NOTE — H&P (Addendum)
History and Physical    Gottlieb Zuercher ERX:[id number] DOB: 04-Sep-1950 DOA: 02/28/2018  PCP: [name], NP   Patient coming from: Home  I have personally briefly reviewed patient's old medical records in [place]  Chief Complaint: Vomiting blood, and dark stools  HPI: Elijah Mcfarland is a 68 y.o. male with medical history significant for seizures, COPD, HTN, pre-DM, to the ED via EMS reports of 3 episodes of vomiting of bright red blood that started this morning, also with dark bowel movements started about 10 AM this morning, also bloody stools.  He reports abdominal pain 2 weeks ago but this has since resolved.  Endorses NSAID use- ibuprofen on the average 800 mg daily and Goody powders at least once daily. Patient reports dark stools 10/2017, for which he was referred for a colonoscopy 3 to 4 months ago,.  Colonoscopy showed sessile polyps and hemorrhoids.  About a week later do black stools stopped.  Currently smokes 2 packs cigarettes daily- Chain-smoking.  Reports has not drank any alcohol since 2015.  ED Course: Tachycardic to 123, soft to hypotensive blood pressure to 96/70.  Hemoglobin dropped from baseline 13-8.8.  EKG sinus tachycardia without EKG changes.  2 units of PRBCs ordered for transfusion.  IV 40 mg Protonix given.  ED provider talked to Dr. Oneida Alar -okay with hospitalist admission here will see in a.m.  Review of Systems: As per HPI all other systems reviewed and negative  Past Medical History:  Diagnosis Date  . Chronic pain syndrome    Left knee  . COPD (chronic obstructive pulmonary disease) ([place])   . DDD (degenerative disc disease), lumbar 11/2008   L5-S1 on plain film  . Emphysema lung ([place])   . GAD (generalized anxiety disorder)   . GERD (gastroesophageal reflux disease)   . History of multiple pulmonary nodules 07/2010   Follow up CT scans showed resolution by 03/2011--NO MALIGNANCY.  Marland Kitchen History of pneumonia 06/2010  . HTN (hypertension) 01/07/2014  .  Osteoarthritis of left knee    + past injury of this knee--tibia fracture?  +left leg shorter than right  . Seizure ([place])   . Seizure disorder ([place])    3 total seizures (first was 1995); neuro eval done and recommended lifetime phenytoin (he had a seizure after coming off the med in the late 90s  . Tobacco dependence     Past Surgical History:  Procedure Laterality Date  . COLONOSCOPY  2010   At [facility]; normal per pt  . COLONOSCOPY WITH PROPOFOL N/A 09/28/2017   Procedure: COLONOSCOPY WITH PROPOFOL;  Surgeon: [name], MD;  Location: [facility] ENDOSCOPY;  Service: Gastroenterology;  Laterality: N/A;  . COLONOSCOPY WITH PROPOFOL N/A 10/13/2017   Procedure: COLONOSCOPY WITH PROPOFOL;  Surgeon: Lin Landsman, MD;  Location: [facility] ENDOSCOPY;  Service: Gastroenterology;  Laterality: N/A;  . KNEE SURGERY     Left : x 2     reports that he has been smoking cigarettes. He has a 45.00 pack-year smoking history. He has never used smokeless tobacco. He reports previous drug use. Drug: Marijuana. He reports that he does not drink alcohol.  No Known Allergies  Family History  Problem Relation Age of Onset  . Heart disease Mother   . Heart disease Father   . Cancer Sister   . Stroke Neg Hx      Prior to Admission medications   Medication Sig Start Date End Date Taking? Authorizing Provider  albuterol (PROVENTIL HFA;VENTOLIN HFA) 108 (90 Base) MCG/ACT  inhaler Inhale 1-2 puffs into the lungs every 6 (six) hours as needed for wheezing or shortness of breath. 03/19/16  Yes [name], NP  ALPRAZolam [name]) 0.5 MG tablet Take 1 tablet (0.5 mg total) by mouth 2 (two) times daily as needed. Patient taking differently: Take 0.5 mg by mouth 2 (two) times daily.  01/31/18  Yes [name], NP  aspirin 81 MG tablet Take 81 mg by mouth every morning.    Yes [provider]  atorvastatin (LIPITOR) 10 MG tablet TAKE 1 TABLET EVERY DAY AT 6 PM. Patient taking differently: Take 10 mg by  mouth every morning.  09/20/17  Yes Baity, Coralie Keens, NP  citalopram (CELEXA) 40 MG tablet TAKE 1 TABLET EVERY DAY Patient taking differently: Take 40 mg by mouth every morning.  09/20/17  Yes Baity, Coralie Keens, NP  diphenhydramine-acetaminophen (TYLENOL PM) 25-500 MG TABS tablet Take 1-2 tablets by mouth at bedtime.    Yes [provider]  losartan (COZAAR) 50 MG tablet TAKE 1 TABLET EVERY DAY Patient taking differently: Take 50 mg by mouth every morning.  09/20/17  Yes Baity, Coralie Keens, NP  meloxicam (MOBIC) 15 MG tablet TAKE 1 TABLET EVERY DAY Patient taking differently: Take 15 mg by mouth every morning.  09/20/17  Yes Baity, Coralie Keens, NP  phenytoin (DILANTIN) 100 MG ER capsule TAKE 3 CAPSULES EVERY DAY Patient taking differently: Take 300 mg by mouth every morning.  09/20/17  Yes Baity, Coralie Keens, NP  Fluticasone-Salmeterol (ADVAIR) 500-50 MCG/DOSE AEPB Inhale 1 puff into the lungs 2 (two) times daily. Patient not taking: Reported on 02/28/2018 03/19/16   [name], NP    Physical Exam: Vitals:   02/28/18 2015 02/28/18 2100 02/28/18 2128 02/28/18 2130  BP:  96/70  117/73  Pulse: (!) 104 (!) 123  99  Resp: 11 16  18   Temp:   98.8 F (37.1 C)   TempSrc:   Rectal   SpO2: 95% 94%  100%  Weight:      Height:        Constitutional: NAD, calm, comfortable Vitals:   02/28/18 2015 02/28/18 2100 02/28/18 2128 02/28/18 2130  BP:  96/70  117/73  Pulse: (!) 104 (!) 123  99  Resp: 11 16  18   Temp:   98.8 F (37.1 C)   TempSrc:   Rectal   SpO2: 95% 94%  100%  Weight:      Height:       Eyes: PERRL, lids and conjunctivae normal ENMT: Mucous membranes are moist. Posterior pharynx clear of any exudate or lesions. Neck: normal, supple, no masses, no thyromegaly Respiratory: Diffuse expiratory wheezing, no crackles. Normal respiratory effort. No accessory muscle use.  Cardiovascular: Tachycardic but regular rate and rhythm, no murmurs / rubs / gallops. No extremity edema. 2+ pedal pulses.  No carotid bruits.  Abdomen: no tenderness, no masses palpated. No hepatosplenomegaly. Bowel sounds positive.  Musculoskeletal: no clubbing / cyanosis. No joint deformity upper and lower extremities. Good ROM, no contractures. Normal muscle tone.  Skin: no rashes, lesions, ulcers. No induration Neurologic: CN 2-12 grossly intact. Sensation intact, DTR normal. Strength 5/5 in all 4.  Psychiatric: Normal judgment and insight. Alert and oriented x 3. Normal mood.   Labs on Admission: I have personally reviewed following labs and imaging studies  CBC: Recent Labs  Lab 02/28/18 1804  WBC 13.2*  NEUTROABS 9.9*  HGB 8.8*  HCT 29.3*  MCV 101.7*  PLT 290  Basic Metabolic Panel: Recent Labs  Lab 02/28/18 1804  NA 139  K 4.7  CL 108  CO2 21*  GLUCOSE 226*  BUN 41*  CREATININE 0.86  CALCIUM 7.9*   Liver Function Tests: Recent Labs  Lab 02/28/18 1804  AST 23  ALT 18  ALKPHOS 48  BILITOT 0.4  PROT 5.7*  ALBUMIN 3.2*    EKG: Independently reviewed.  Sinus tachycardia.  EKG unchanged from prior.  Assessment/Plan Active Problems:   GI bleed   Upper GI blood loss-Hematemesis, melena and hematochezia.  Hemoglobin 8.8 down ~5points.  Blood pressure systolic 96 improved to 396 after 500 mill bolus.  Colonoscopy 10/2017 shows sessile polyps and external hemorrhoids. -Transfuse 1 unit PRBC -CBC every 6 hourly -Low hemoglobin >8. -Protonix bolus and gtt. - GI consulted, will see in a.m - NPO - Hydrate N/s 100cc/hr X 15hrs  COPD-wheezing on exam, denies difficulty breathing.  2 packs cigarettes daily, and smoking. -Duo nebs -Continue home Advair  Seizures- Stable.  -Continue PO phenytoin as IV will NPO, Spoke to pharmacy IV phenytoin 100 3 times daily  HTN- hypotensive to soft blood pressure. -Hold home losartan   DVT prophylaxis: Scds Code Status: Full Family Communication: Daughter and spouse at bedside Disposition Plan: Per rounding team Consults called:  GI Admission status: Obs, Step down   Bethena Roys MD [facility] Pager [phone] From 3PM- 11PM.  Otherwise please contact night-coverage [email or web address] Password [facility]  02/28/2018, 10:57 PM

## 2018-02-28 NOTE — ED Provider Notes (Signed)
[facility] EMERGENCY DEPARTMENT Provider Note   CSN: [id number] Arrival date & time: 02/28/18  1734     History   Chief Complaint Chief Complaint  Patient presents with  . GI Bleeding    HPI Elijah Mcfarland is a 68 y.o. male.  HPI   He presents for evaluation of vomiting blood.  He noticed dark bowel movement this morning later vomited bright red blood.  That episode occurred about 10 AM.  Later around 2:58 PM he vomited blood again so decided to come here.  States he has been using ibuprofen and Tylenol recently for pain.  He was transferred by EMS found to be hypotensive, and vomited a moderate amount of bright red blood.  Patient denies prior GI bleeding.  He is taking his usual medications.  Felt somewhat weak today, but denies chest pain, shortness of breath, focal weakness or paresthesia.  There are no other known modifying factors.  Past Medical History:  Diagnosis Date  . Chronic pain syndrome    Left knee  . COPD (chronic obstructive pulmonary disease) ([place])   . DDD (degenerative disc disease), lumbar 11/2008   L5-S1 on plain film  . Emphysema lung ([place])   . GAD (generalized anxiety disorder)   . GERD (gastroesophageal reflux disease)   . History of multiple pulmonary nodules 07/2010   Follow up CT scans showed resolution by 03/2011--NO MALIGNANCY.  Marland Kitchen History of pneumonia 06/2010  . HTN (hypertension) 01/07/2014  . Osteoarthritis of left knee    + past injury of this knee--tibia fracture?  +left leg shorter than right  . Seizure ([place])   . Seizure disorder ([place])    3 total seizures (first was 1995); neuro eval done and recommended lifetime phenytoin (he had a seizure after coming off the med in the late 90s  . Tobacco dependence     Patient Active Problem List   Diagnosis Date Noted  . Encounter for screening colonoscopy   . Macular degeneration 08/22/2017  . HTN (hypertension) 01/07/2014  . Prediabetes 04/13/2013  . 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 Anxiety  state 04/25/2006  . TOBACCO ABUSE 04/25/2006  . 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 COPD (chronic obstructive pulmonary disease) ([place]) 04/25/2006  . Arthritis 04/25/2006    Past Surgical History:  Procedure Laterality Date  . COLONOSCOPY  2010   At [facility]; normal per pt  . COLONOSCOPY WITH PROPOFOL N/A 09/28/2017   Procedure: COLONOSCOPY WITH PROPOFOL;  Surgeon: [name], MD;  Location: [facility] ENDOSCOPY;  Service: Gastroenterology;  Laterality: N/A;  . COLONOSCOPY WITH PROPOFOL N/A 10/13/2017   Procedure: COLONOSCOPY WITH PROPOFOL;  Surgeon: Lin Landsman, MD;  Location: [facility] ENDOSCOPY;  Service: Gastroenterology;  Laterality: N/A;  . KNEE SURGERY     Left : x 2        Home Medications    Prior to Admission medications   Medication Sig Start Date End Date Taking? Authorizing Provider  albuterol (PROVENTIL HFA;VENTOLIN HFA) 108 (90 Base) MCG/ACT inhaler Inhale 1-2 puffs into the lungs every 6 (six) hours as needed for wheezing or shortness of breath. 03/19/16  Yes [name], NP  ALPRAZolam [name]) 0.5 MG tablet Take 1 tablet (0.5 mg total) by mouth 2 (two) times daily as needed. Patient taking differently: Take 0.5 mg by mouth 2 (two) times daily.  01/31/18  Yes [name], NP  aspirin 81 MG tablet Take 81 mg by mouth every morning.    Yes [provider]  atorvastatin (LIPITOR)  10 MG tablet TAKE 1 TABLET EVERY DAY AT 6 PM. Patient taking differently: Take 10 mg by mouth every morning.  09/20/17  Yes Baity, Coralie Keens, NP  citalopram (CELEXA) 40 MG tablet TAKE 1 TABLET EVERY DAY Patient taking differently: Take 40 mg by mouth every morning.  09/20/17  Yes Baity, Coralie Keens, NP  diphenhydramine-acetaminophen (TYLENOL PM) 25-500 MG TABS tablet Take 1-2 tablets by mouth at bedtime.    Yes [provider]  losartan (COZAAR) 50 MG tablet TAKE 1 TABLET EVERY DAY Patient taking differently: Take 50 mg by mouth every morning.  09/20/17  Yes Baity, Coralie Keens, NP  meloxicam (MOBIC) 15 MG tablet TAKE 1 TABLET EVERY DAY Patient taking differently: Take 15 mg by mouth every morning.  09/20/17  Yes Baity, Coralie Keens, NP  phenytoin (DILANTIN) 100 MG ER capsule TAKE 3 CAPSULES EVERY DAY Patient taking differently: Take 300 mg by mouth every morning.  09/20/17  Yes Baity, Coralie Keens, NP  Fluticasone-Salmeterol (ADVAIR) 500-50 MCG/DOSE AEPB Inhale 1 puff into the lungs 2 (two) times daily. Patient not taking: Reported on 02/28/2018 03/19/16   [name], NP    Family History Family History  Problem Relation Age of Onset  . Heart disease Mother   . Heart disease Father   . Cancer Sister   . Stroke Neg Hx     Social History Social History   Tobacco Use  . Smoking status: Current Every Day Smoker    Packs/day: 1.00    Years: 45.00    Pack years: 45.00    Types: Cigarettes  . Smokeless tobacco: Never Used  Substance Use Topics  . Alcohol use: No    Alcohol/week: 0.0 standard drinks  . Drug use: Not Currently    Types: Marijuana    Comment: 2 month     Allergies   Patient has no known allergies.   Review of Systems Review of Systems  All other systems reviewed and are negative.    Physical Exam Updated Vital Signs BP 117/73   Pulse 99   Temp 98.8 F (37.1 C) (Rectal)   Resp 18   Ht 5\' 8"  (1.727 m)   Wt 56.7 kg   SpO2 100%   BMI 19.01 kg/m   Physical Exam Vitals signs and nursing note reviewed.  Constitutional:      General: He is in acute distress.     Appearance: He is well-developed. He is ill-appearing. He is not toxic-appearing or diaphoretic.     Comments: Frail, elderly  HENT:     Head: Normocephalic and atraumatic.     Right Ear: External ear normal.     Left Ear: External ear normal.     Nose: Nose normal.     Mouth/Throat:     Mouth: Mucous membranes are moist.     Pharynx: Oropharynx is clear. No posterior oropharyngeal erythema.  Eyes:     Conjunctiva/sclera: Conjunctivae normal.     Pupils: Pupils are  equal, round, and reactive to light.  Neck:     Musculoskeletal: Normal range of motion and neck supple.     Trachea: Phonation normal.  Cardiovascular:     Rate and Rhythm: Normal rate and regular rhythm.     Heart sounds: Normal heart sounds.  Pulmonary:     Effort: Pulmonary effort is normal.     Breath sounds: Normal breath sounds.  Abdominal:     General: There is no distension.     Palpations: Abdomen  is soft. There is no mass.     Tenderness: There is no abdominal tenderness.  Musculoskeletal: Normal range of motion.        General: No tenderness or signs of injury.  Skin:    General: Skin is warm and dry.  Neurological:     Mental Status: He is alert and oriented to person, place, and time.     Cranial Nerves: No cranial nerve deficit.     Sensory: No sensory deficit.     Motor: No abnormal muscle tone.     Coordination: Coordination normal.  Psychiatric:        Behavior: Behavior normal.        Thought Content: Thought content normal.        Judgment: Judgment normal.      ED Treatments / Results  Labs (all labs ordered are listed, but only abnormal results are displayed) Labs Reviewed  CBC WITH DIFFERENTIAL/PLATELET - Abnormal; Notable for the following components:      Result Value   WBC 13.2 (*)    RBC 2.88 (*)    Hemoglobin 8.8 (*)    HCT 29.3 (*)    MCV 101.7 (*)    Neutro Abs 9.9 (*)    Eosinophils Absolute 0.6 (*)    Abs Immature Granulocytes 0.12 (*)    All other components within normal limits  COMPREHENSIVE METABOLIC PANEL - Abnormal; Notable for the following components:   CO2 21 (*)    Glucose, Bld 226 (*)    BUN 41 (*)    Calcium 7.9 (*)    Total Protein 5.7 (*)    Albumin 3.2 (*)    All other components within normal limits  POC OCCULT BLOOD, ED - Abnormal; Notable for the following components:   Fecal Occult Bld POSITIVE (*)    All other components within normal limits  TYPE AND SCREEN  PREPARE RBC (CROSSMATCH)  ABO/RH    EKG EKG  Interpretation  Date/Time:  Tuesday February 28 2018 18:13:42 EST Ventricular Rate:  119 PR Interval:    QRS Duration: 79 QT Interval:  334 QTC Calculation: 470 R Axis:   77 Text Interpretation:  Sinus tachycardia since last tracing no significant change Confirmed by Daleen Bo [phone]) on 02/28/2018 6:18:55 PM   Radiology No results found.  Procedures .Critical Care Performed by: Daleen Bo, MD Authorized by: Daleen Bo, MD   Critical care provider statement:    Critical care time (minutes):  35   Critical care start time:  02/28/2018 6:00 PM   Critical care end time:  02/28/2018 9:40 PM   Critical care time was exclusive of:  Separately billable procedures and treating other patients   Critical care was necessary to treat or prevent imminent or life-threatening deterioration of the following conditions:  Circulatory failure   Critical care was time spent personally by me on the following activities:  Blood draw for specimens, development of treatment plan with patient or surrogate, discussions with consultants, evaluation of patient's response to treatment, examination of patient, obtaining history from patient or surrogate, ordering and performing treatments and interventions, ordering and review of laboratory studies, pulse oximetry, re-evaluation of patient's condition, review of old charts and ordering and review of radiographic studies   (including critical care time)  Medications Ordered in ED Medications  pantoprazole (PROTONIX) 80 mg in sodium chloride 0.9 % 250 mL (0.32 mg/mL) infusion (has no administration in time range)  pantoprazole (PROTONIX) injection 40 mg (has no administration in time  range)  sodium chloride 0.9 % bolus 500 mL (0 mLs Intravenous Stopped 02/28/18 2006)  pantoprazole (PROTONIX) injection 40 mg (40 mg Intravenous Given 02/28/18 2109)  0.9 %  sodium chloride infusion (10 mL/hr Intravenous New Bag/Given 02/28/18 2128)     Initial Impression  / Assessment and Plan / ED Course  I have reviewed the triage vital signs and the nursing notes.  Pertinent labs & imaging results that were available during my care of the patient were reviewed by me and considered in my medical decision making (see chart for details).  Clinical Course as of Feb 28 2157  Tue Feb 28, 2018  2108 Abnormal- positive  POC occult blood, ED Provider will collect(!) [EW]  2108 Normal except CO2 low, glucose high, BUN high, calcium low, total protein low, albumin low  Comprehensive metabolic panel(!) [EW]  1950 Normal except white count high, hemoglobin low, MCV high  CBC with Differential(!) [EW]  2139 Case discussed with gastroenterologist, Dr. Oneida Alar who agrees to see the patient as a Optometrist, tomorrow.  She feels that he is stable for admission tonight without intervention.   [EW]    Clinical Course User Index [EW] Daleen Bo, MD     Patient Vitals for the past 24 hrs:  BP Temp Temp src Pulse Resp SpO2 Height Weight  02/28/18 2130 117/73 - - 99 18 100 % - -  02/28/18 2128 - 98.8 F (37.1 C) Rectal - - - - -  02/28/18 2100 96/70 - - (!) 123 16 94 % - -  02/28/18 2015 - - - (!) 104 11 95 % - -  02/28/18 2000 97/67 - - - 11 - - -  02/28/18 1900 103/83 - - (!) 123 14 100 % - -  02/28/18 1830 - - - (!) 117 13 100 % - -  02/28/18 1745 111/79 (!) 97.3 F (36.3 C) Oral (!) 56 18 100 % - -  02/28/18 1742 - - - - - - 5\' 8"  (1.727 m) 56.7 kg    9:09 PM Reevaluation with update and discussion. After initial assessment and treatment, an updated evaluation reveals he remains comfortable, mild persistent hypotension, with tachycardia.  Blood transfusion ordered.  Patient has not had any stooling or vomiting since arrival in the ED.  Findings discussed with patient and family members, all questions were answered. Daleen Bo   Medical Decision Making: Elevation consistent with upper GI bleed, peptic ulcer/gastritis likely etiologies.  Patient requires  transfusion for symptomatic anemia.  CRITICAL CARE-yes Performed by: Daleen Bo  Nursing Notes Reviewed/ Care Coordinated Applicable Imaging Reviewed Interpretation of Laboratory Data incorporated into ED treatment  9:39 PM-Consult complete with hospitalist. Patient case explained and discussed.  She agrees to admit patient for further evaluation and treatment. Call ended at 9:48 PM  Plan: Admit   Final Clinical Impressions(s) / ED Diagnoses   Final diagnoses:  Gastrointestinal hemorrhage with hematemesis  Anemia, unspecified type    ED Discharge Orders    None       Daleen Bo, MD 02/28/18 2159

## 2018-02-28 NOTE — ED Triage Notes (Signed)
Pt from home.  EMS reports pt started vomiting dark red blood since 10am this morning.  Reports has vomited x 3.  Reports pt takes "quite a bit" of ibuprofen and tylenol.  EMS says pt vomited approx 547ml with them.  BP 95/51 and o2 sat 92%.  EMS started IV and administering normal saline.  Reports put pt on nrb until his sat increased to 100%.  Reports pt was extremely pale upon their arrival.  Pt presently denies any pain.  Pt took 2 steps at home and had near syncopal episode.  Pt had dark BM this morning before he started vomiting.  Reports BM has been red since the initial one this morning.  Pt alert and oriented.  Denies any pain.  EMS administered 4mg  zofran pta.  Pt denies nausea.

## 2018-02-28 NOTE — ED Notes (Signed)
ED Provider at bedside. 

## 2018-03-01 ENCOUNTER — Encounter (HOSPITAL_COMMUNITY): Payer: Self-pay | Admitting: *Deleted

## 2018-03-01 ENCOUNTER — Encounter (HOSPITAL_COMMUNITY)
Admission: EM | Disposition: A | Discharge status: Home / Self Care | Payer: Self-pay | Source: Home / Self Care | Attending: Internal Medicine

## 2018-03-01 ENCOUNTER — Other Ambulatory Visit: Payer: Self-pay

## 2018-03-01 DIAGNOSIS — I959 Hypotension, unspecified: Secondary | ICD-10-CM | POA: Diagnosis present

## 2018-03-01 DIAGNOSIS — K210 Gastro-esophageal reflux disease with esophagitis: Secondary | ICD-10-CM | POA: Diagnosis present

## 2018-03-01 DIAGNOSIS — Z791 Long term (current) use of non-steroidal anti-inflammatories (NSAID): Secondary | ICD-10-CM | POA: Diagnosis not present

## 2018-03-01 DIAGNOSIS — Z716 Tobacco abuse counseling: Secondary | ICD-10-CM | POA: Diagnosis not present

## 2018-03-01 DIAGNOSIS — Z23 Encounter for immunization: Secondary | ICD-10-CM | POA: Diagnosis present

## 2018-03-01 DIAGNOSIS — R000 Tachycardia, unspecified: Secondary | ICD-10-CM | POA: Diagnosis present

## 2018-03-01 DIAGNOSIS — K2211 Ulcer of esophagus with bleeding: Secondary | ICD-10-CM | POA: Diagnosis present

## 2018-03-01 DIAGNOSIS — K2971 Gastritis, unspecified, with bleeding: Secondary | ICD-10-CM | POA: Diagnosis present

## 2018-03-01 DIAGNOSIS — E7800 Pure hypercholesterolemia, unspecified: Secondary | ICD-10-CM | POA: Diagnosis present

## 2018-03-01 DIAGNOSIS — K319 Disease of stomach and duodenum, unspecified: Secondary | ICD-10-CM | POA: Diagnosis present

## 2018-03-01 DIAGNOSIS — M1712 Unilateral primary osteoarthritis, left knee: Secondary | ICD-10-CM | POA: Diagnosis present

## 2018-03-01 DIAGNOSIS — Z8249 Family history of ischemic heart disease and other diseases of the circulatory system: Secondary | ICD-10-CM | POA: Diagnosis not present

## 2018-03-01 DIAGNOSIS — K920 Hematemesis: Secondary | ICD-10-CM | POA: Diagnosis present

## 2018-03-01 DIAGNOSIS — D62 Acute posthemorrhagic anemia: Secondary | ICD-10-CM | POA: Diagnosis present

## 2018-03-01 DIAGNOSIS — K922 Gastrointestinal hemorrhage, unspecified: Secondary | ICD-10-CM | POA: Diagnosis not present

## 2018-03-01 DIAGNOSIS — K3189 Other diseases of stomach and duodenum: Secondary | ICD-10-CM

## 2018-03-01 DIAGNOSIS — G40409 Other generalized epilepsy and epileptic syndromes, not intractable, without status epilepticus: Secondary | ICD-10-CM | POA: Diagnosis present

## 2018-03-01 DIAGNOSIS — K921 Melena: Secondary | ICD-10-CM | POA: Diagnosis present

## 2018-03-01 DIAGNOSIS — Z7982 Long term (current) use of aspirin: Secondary | ICD-10-CM | POA: Diagnosis not present

## 2018-03-01 DIAGNOSIS — J449 Chronic obstructive pulmonary disease, unspecified: Secondary | ICD-10-CM | POA: Diagnosis not present

## 2018-03-01 DIAGNOSIS — D649 Anemia, unspecified: Secondary | ICD-10-CM | POA: Diagnosis not present

## 2018-03-01 DIAGNOSIS — J439 Emphysema, unspecified: Secondary | ICD-10-CM | POA: Diagnosis present

## 2018-03-01 DIAGNOSIS — K2960 Other gastritis without bleeding: Secondary | ICD-10-CM

## 2018-03-01 DIAGNOSIS — K449 Diaphragmatic hernia without obstruction or gangrene: Secondary | ICD-10-CM | POA: Diagnosis present

## 2018-03-01 DIAGNOSIS — K219 Gastro-esophageal reflux disease without esophagitis: Secondary | ICD-10-CM | POA: Diagnosis present

## 2018-03-01 DIAGNOSIS — K2210 Ulcer of esophagus without bleeding: Secondary | ICD-10-CM | POA: Diagnosis not present

## 2018-03-01 DIAGNOSIS — Y929 Unspecified place or not applicable: Secondary | ICD-10-CM | POA: Diagnosis not present

## 2018-03-01 DIAGNOSIS — T39395A Adverse effect of other nonsteroidal anti-inflammatory drugs [NSAID], initial encounter: Secondary | ICD-10-CM | POA: Diagnosis present

## 2018-03-01 DIAGNOSIS — G894 Chronic pain syndrome: Secondary | ICD-10-CM | POA: Diagnosis present

## 2018-03-01 DIAGNOSIS — Z79899 Other long term (current) drug therapy: Secondary | ICD-10-CM | POA: Diagnosis not present

## 2018-03-01 DIAGNOSIS — F17210 Nicotine dependence, cigarettes, uncomplicated: Secondary | ICD-10-CM | POA: Diagnosis present

## 2018-03-01 DIAGNOSIS — K644 Residual hemorrhoidal skin tags: Secondary | ICD-10-CM | POA: Diagnosis present

## 2018-03-01 DIAGNOSIS — I10 Essential (primary) hypertension: Secondary | ICD-10-CM | POA: Diagnosis present

## 2018-03-01 DIAGNOSIS — M549 Dorsalgia, unspecified: Secondary | ICD-10-CM | POA: Diagnosis present

## 2018-03-01 HISTORY — PX: ESOPHAGOGASTRODUODENOSCOPY: SHX5428

## 2018-03-01 LAB — CBC
HCT: 29.6 % — ABNORMAL LOW (ref 39.0–52.0)
Hemoglobin: 9.9 g/dL — ABNORMAL LOW (ref 13.0–17.0)
MCH: 31 pg (ref 26.0–34.0)
MCHC: 33.4 g/dL (ref 30.0–36.0)
MCV: 92.8 fL (ref 80.0–100.0)
Platelets: 167 10*3/uL (ref 150–400)
RBC: 3.19 MIL/uL — AB (ref 4.22–5.81)
RDW: 15.7 % — ABNORMAL HIGH (ref 11.5–15.5)
WBC: 7.4 10*3/uL (ref 4.0–10.5)
nRBC: 0 % (ref 0.0–0.2)

## 2018-03-01 LAB — PROTIME-INR
INR: 1.09
Prothrombin Time: 14 seconds (ref 11.4–15.2)

## 2018-03-01 SURGERY — EGD (ESOPHAGOGASTRODUODENOSCOPY)
Anesthesia: Moderate Sedation

## 2018-03-01 MED ORDER — MIDAZOLAM HCL 5 MG/5ML IJ SOLN
INTRAMUSCULAR | Status: DC | PRN
  Administered 2018-03-01 (×5): 2 mg via INTRAVENOUS

## 2018-03-01 MED ORDER — NICOTINE 21 MG/24HR TD PT24
21.0000 mg | MEDICATED_PATCH | Freq: Every day | TRANSDERMAL | Status: DC
  Administered 2018-03-01 – 2018-03-03 (×3): 21 mg via TRANSDERMAL
  Filled 2018-03-01 (×3): qty 1

## 2018-03-01 MED ORDER — INFLUENZA VAC SPLIT HIGH-DOSE 0.5 ML IM SUSY
0.5000 mL | PREFILLED_SYRINGE | INTRAMUSCULAR | Status: AC
  Administered 2018-03-03: 0.5 mL via INTRAMUSCULAR
  Filled 2018-03-01: qty 0.5

## 2018-03-01 MED ORDER — STERILE WATER FOR IRRIGATION IR SOLN
Status: DC | PRN
  Administered 2018-03-01: 1.5 mL

## 2018-03-01 MED ORDER — MEPERIDINE HCL 50 MG/ML IJ SOLN
INTRAMUSCULAR | Status: AC
  Filled 2018-03-01: qty 1

## 2018-03-01 MED ORDER — LIDOCAINE VISCOUS HCL 2 % MT SOLN
OROMUCOSAL | Status: AC
  Filled 2018-03-01: qty 15

## 2018-03-01 MED ORDER — PNEUMOCOCCAL VAC POLYVALENT 25 MCG/0.5ML IJ INJ
0.5000 mL | INJECTION | INTRAMUSCULAR | Status: AC
  Administered 2018-03-03: 0.5 mL via INTRAMUSCULAR
  Filled 2018-03-01: qty 0.5

## 2018-03-01 MED ORDER — MIDAZOLAM HCL 5 MG/5ML IJ SOLN
INTRAMUSCULAR | Status: AC
  Filled 2018-03-01: qty 10

## 2018-03-01 MED ORDER — IPRATROPIUM-ALBUTEROL 0.5-2.5 (3) MG/3ML IN SOLN
3.0000 mL | Freq: Four times a day (QID) | RESPIRATORY_TRACT | Status: DC
  Administered 2018-03-01: 3 mL via RESPIRATORY_TRACT
  Filled 2018-03-01: qty 3

## 2018-03-01 MED ORDER — MEPERIDINE HCL 50 MG/ML IJ SOLN
INTRAMUSCULAR | Status: DC | PRN
  Administered 2018-03-01: 25 mg via INTRAVENOUS

## 2018-03-01 MED ORDER — PANTOPRAZOLE SODIUM 40 MG IV SOLR
INTRAVENOUS | Status: AC
  Filled 2018-03-01: qty 80

## 2018-03-01 MED ORDER — LIDOCAINE VISCOUS HCL 2 % MT SOLN
OROMUCOSAL | Status: DC | PRN
  Administered 2018-03-01: 1 via OROMUCOSAL

## 2018-03-01 MED ORDER — IPRATROPIUM-ALBUTEROL 0.5-2.5 (3) MG/3ML IN SOLN: 3.0000 mL | Freq: Three times a day (TID) | RESPIRATORY_TRACT | Status: DC | PRN

## 2018-03-01 MED ORDER — SODIUM CHLORIDE 0.9 % IV SOLN: INTRAVENOUS | Status: DC

## 2018-03-01 NOTE — ED Notes (Signed)
Pt transported to ENDO

## 2018-03-01 NOTE — Consult Note (Addendum)
Reason for Consult: Upper GI bleed Referring Physician: Hospitalist.  PCP Dr. Shanon Ace [name] is an 68 y.o. male.  HPI: Patient states he ate 2 tangerines and a cup of coffee then laid back down. Around 1045, he got up and felt nauseated. Vomited blood x 2. Describes the blood as dark red blood. Around 4pm he vomited x 2 again. Felt very weak and asked wife to call EMS. Hemoglobin on admission 7.3. Dropped to 7.3. Has received 2 units of blood.  Hemoglobin this am 9.9. Has been  Having black stools at home. Patient has been taking Tylenol, BC powders and Goody Powders daily for chronic back pain. Also had been taken Ibuprofen 2-3 daily. Has been taking NSAIDS since 2013. Does take a Baby ASA daily. Hx of COPD, hypertension, and high cholesterol, seizures.  Retired from [employer], married, 4 children in good health.  Smokes 2 packs cigarettes daily.  Home meds indicate he is on Meloxicam but he did not mention this drug to me.   Past Medical History:  Diagnosis Date  . Chronic pain syndrome    Left knee  . COPD (chronic obstructive pulmonary disease) ([place])   . DDD (degenerative disc disease), lumbar 11/2008   L5-S1 on plain film  . Emphysema lung ([place])   . GAD (generalized anxiety disorder)   . GERD (gastroesophageal reflux disease)   . History of multiple pulmonary nodules 07/2010   Follow up CT scans showed resolution by 03/2011--NO MALIGNANCY.  Marland Kitchen History of pneumonia 06/2010  . HTN (hypertension) 01/07/2014  . Osteoarthritis of left knee    + past injury of this knee--tibia fracture?  +left leg shorter than right  . Seizure ([place])   . Seizure disorder ([place])    3 total seizures (first was 1995); neuro eval done and recommended lifetime phenytoin (he had a seizure after coming off the med in the late 90s  . Tobacco dependence     Past Surgical History:  Procedure Laterality Date  . COLONOSCOPY  2010   At [facility]; normal per pt  . COLONOSCOPY WITH PROPOFOL N/A  09/28/2017   Procedure: COLONOSCOPY WITH PROPOFOL;  Surgeon: [name], MD;  Location: [facility] ENDOSCOPY;  Service: Gastroenterology;  Laterality: N/A;  . COLONOSCOPY WITH PROPOFOL N/A 10/13/2017   Procedure: COLONOSCOPY WITH PROPOFOL;  Surgeon: Lin Landsman, MD;  Location: [facility] ENDOSCOPY;  Service: Gastroenterology;  Laterality: N/A;  . KNEE SURGERY     Left : x 2    Family History  Problem Relation Age of Onset  . Heart disease Mother   . Heart disease Father   . Cancer Sister   . Stroke Neg Hx     Social History:  reports that he has been smoking cigarettes. He has a 45.00 pack-year smoking history. He has never used smokeless tobacco. He reports previous drug use. Drug: Marijuana. He reports that he does not drink alcohol.  Allergies: No Known Allergies  Medications: I have reviewed the patient's current medications.  Results for orders placed or performed during the hospital encounter of 02/28/18 (from the past 48 hour(s))  ABO/Rh     Status: None   Collection Time: 02/28/18  6:03 PM  Result Value Ref Range   ABO/RH(D)      O POS Performed at [facility], [address] [place], [place] [address]   CBC with Differential     Status: Abnormal   Collection Time: 02/28/18  6:04 PM  Result Value Ref Range   WBC  13.2 (H) 4.0 - 10.5 K/uL   RBC 2.88 (L) 4.22 - 5.81 MIL/uL   Hemoglobin 8.8 (L) 13.0 - 17.0 g/dL   HCT 29.3 (L) 39.0 - 52.0 %   MCV 101.7 (H) 80.0 - 100.0 fL   MCH 30.6 26.0 - 34.0 pg   MCHC 30.0 30.0 - 36.0 g/dL   RDW 14.8 11.5 - 15.5 %   Platelets 290 150 - 400 K/uL   nRBC 0.0 0.0 - 0.2 %   Neutrophils Relative % 75 %   Neutro Abs 9.9 (H) 1.7 - 7.7 K/uL   Lymphocytes Relative 14 %   Lymphs Abs 1.9 0.7 - 4.0 K/uL   Monocytes Relative 5 %   Monocytes Absolute 0.7 0.1 - 1.0 K/uL   Eosinophils Relative 4 %   Eosinophils Absolute 0.6 (H) 0.0 - 0.5 K/uL   Basophils Relative 1 %   Basophils Absolute 0.1 0.0 - 0.1 K/uL   Immature Granulocytes 1 %   Abs  Immature Granulocytes 0.12 (H) 0.00 - 0.07 K/uL    Comment: Performed at [facility], [address] [place], [place] [address]  Comprehensive metabolic panel     Status: Abnormal   Collection Time: 02/28/18  6:04 PM  Result Value Ref Range   Sodium 139 135 - 145 mmol/L   Potassium 4.7 3.5 - 5.1 mmol/L   Chloride 108 98 - 111 mmol/L   CO2 21 (L) 22 - 32 mmol/L   Glucose, Bld 226 (H) 70 - 99 mg/dL   BUN 41 (H) 8 - 23 mg/dL   Creatinine, Ser 0.86 0.61 - 1.24 mg/dL   Calcium 7.9 (L) 8.9 - 10.3 mg/dL   Total Protein 5.7 (L) 6.5 - 8.1 g/dL   Albumin 3.2 (L) 3.5 - 5.0 g/dL   AST 23 15 - 41 U/L   ALT 18 0 - 44 U/L   Alkaline Phosphatase 48 38 - 126 U/L   Total Bilirubin 0.4 0.3 - 1.2 mg/dL   GFR calc non Af Amer >60 >60 mL/min   GFR calc Af Amer >60 >60 mL/min   Anion gap 10 5 - 15    Comment: Performed at [facility], [address], [place], [place] [address]  Type and screen [facility]     Status: None (Preliminary result)   Collection Time: 02/28/18  6:04 PM  Result Value Ref Range   ABO/RH(D) O POS    Antibody Screen NEG    Sample Expiration 03/03/2018    Unit Number [id number]    Blood Component Type RED CELLS,LR    Unit division 00    Status of Unit ISSUED    Transfusion Status OK TO TRANSFUSE    Crossmatch Result Compatible    Unit Number [id number]    Blood Component Type RED CELLS,LR    Unit division 00    Status of Unit ISSUED,FINAL    Transfusion Status OK TO TRANSFUSE    Crossmatch Result      Compatible Performed at [facility], [address] [place], [place] [address]   Prepare RBC     Status: None   Collection Time: 02/28/18  6:04 PM  Result Value Ref Range   Order Confirmation      ORDER PROCESSED BY BLOOD BANK Performed at [facility], [address], [place], [place] [address]   POC occult blood, ED Provider will collect     Status: Abnormal   Collection Time: 02/28/18  9:03 PM  Result Value Ref Range  Fecal Occult Bld POSITIVE (A)  NEGATIVE  CBC     Status: Abnormal   Collection Time: 02/28/18 10:20 PM  Result Value Ref Range   WBC 8.4 4.0 - 10.5 K/uL   RBC 2.32 (L) 4.22 - 5.81 MIL/uL   Hemoglobin 7.3 (L) 13.0 - 17.0 g/dL   HCT 23.3 (L) 39.0 - 52.0 %   MCV 100.4 (H) 80.0 - 100.0 fL   MCH 31.5 26.0 - 34.0 pg   MCHC 31.3 30.0 - 36.0 g/dL   RDW 14.8 11.5 - 15.5 %   Platelets 204 150 - 400 K/uL   nRBC 0.0 0.0 - 0.2 %    Comment: Performed at [facility], [address], [place], [place] [address]  CBC     Status: Abnormal   Collection Time: 03/01/18  5:15 AM  Result Value Ref Range   WBC 7.4 4.0 - 10.5 K/uL   RBC 3.19 (L) 4.22 - 5.81 MIL/uL   Hemoglobin 9.9 (L) 13.0 - 17.0 g/dL    Comment: POST TRANSFUSION SPECIMEN   HCT 29.6 (L) 39.0 - 52.0 %   MCV 92.8 80.0 - 100.0 fL    Comment: POST TRANSFUSION SPECIMEN   MCH 31.0 26.0 - 34.0 pg   MCHC 33.4 30.0 - 36.0 g/dL   RDW 15.7 (H) 11.5 - 15.5 %   Platelets 167 150 - 400 K/uL   nRBC 0.0 0.0 - 0.2 %    Comment: Performed at [facility], [address] [place], [place] [address]    No results found.  ROS Blood pressure 110/72, pulse 84, temperature 99.4 F (37.4 C), temperature source Oral, resp. rate 15, height 5\' 8"  (1.727 m), weight 56.7 kg, SpO2 95 %. Physical Exam Alert and oriented. Skin warm and dry. Oral mucosa is moist.   . Sclera anicteric, conjunctivae is pink. Thyroid not enlarged. No cervical lymphadenopathy. Bilateral wheezes.  Heart regular rate and rhythm.  Abdomen is soft. Bowel sounds are positive. No hepatomegaly. No abdominal masses felt. No tenderness.  No edema to lower extremities.    Assessment/Plan: UGI bleed in setting of excessive NSAIDs. EGD to ruled out PUD today.  Dr. Laural Golden is aware. Agree with Protonix. Will get a PT/INR  Butch Penny 03/01/2018, 8:37 AM

## 2018-03-01 NOTE — Op Note (Signed)
[facility] Patient Name: Elijah Mcfarland Procedure Date: 03/01/2018 2:45 PM MRN: [id number] Date of Birth: [birth year] Attending MD: Hildred Laser , MD CSN: [id number] Age: 68 Admit Type: Inpatient Procedure:                Upper GI endoscopy Indications:              Hematemesis Providers:                Hildred Laser, MD, Charlsie Quest. Theda Sers RN, RN,                            Randa Spike, Technician Referring MD:             Irwin Brakeman, MD Medicines:                Lidocaine spray, Meperidine 25 mg IV, Midazolam 10                            mg IV Complications:            No immediate complications. Estimated Blood Loss:     Estimated blood loss: none. Procedure:                Pre-Anesthesia Assessment:                           - Prior to the procedure, a History and Physical                            was performed, and patient medications and                            allergies were reviewed. The patient's tolerance of                            previous anesthesia was also reviewed. The risks                            and benefits of the procedure and the sedation                            options and risks were discussed with the patient.                            All questions were answered, and informed consent                            was obtained. Prior Anticoagulants: The patient                            last took aspirin 1 day and previous NSAID                            medication 1 day prior to the procedure. ASA Grade  Assessment: III - A patient with severe systemic                            disease. After reviewing the risks and benefits,                            the patient was deemed in satisfactory condition to                            undergo the procedure.                           After obtaining informed consent, the endoscope was                            passed under direct vision. Throughout the                       procedure, the patient's blood pressure, pulse, and                            oxygen saturations were monitored continuously. The                            [id number]-[id number] ([id number]) was introduced through the                            mouth, and advanced to the third part of duodenum.                            The upper GI endoscopy was accomplished without                            difficulty. The patient tolerated the procedure                            well. Scope In: 3:13:21 PM Scope Out: 3:24:46 PM Total Procedure Duration: 0 hours 11 minutes 25 seconds  Findings:      One superficial esophageal ulcer with no bleeding and no stigmata of       recent bleeding was found 35 cm from the incisors. The lesion was 5 mm       in largest dimension.      LA Grade A (one or more mucosal breaks less than 5 mm, not extending       between tops of 2 mucosal folds) esophagitis with no bleeding was found       42 cm from the incisors.      A 2 cm hiatal hernia was present.      Stomach empty with blood or clots      A single, small non-bleeding erosion was found in the gastric body and       on the posterior wall of the stomach. There were no stigmata of recent       bleeding.      The exam of the stomach was otherwise normal.      The duodenal bulb, second portion  of the duodenum and third portion of       the duodenum were normal. Impression:               - Non-bleeding esophageal ulcer. Possible pill                            esophagitis.                           - LA Grade A reflux esophagitis.                           - 2 cm hiatal hernia.                           - Non-bleeding erosive gastropathy.                           - Normal duodenal bulb, second portion of the                            duodenum and third portion of the duodenum.                           - No specimens collected. Moderate Sedation:      Moderate (conscious) sedation was administered by  the endoscopy nurse       and supervised by the endoscopist. The following parameters were       monitored: oxygen saturation, heart rate, blood pressure, CO2       capnography and response to care. Total physician intraservice time was       21 minutes. Recommendation:           - Return patient to hospital ward for ongoing care.                           - Clear liquid diet today.                           - Continue present medications.                           - No aspirin, ibuprofen, naproxen, or other                            non-steroidal anti-inflammatory drugs.                           - Perform an H. pylori serology today.                           - If bleed recurs will need further evaluation. Procedure Code(s):        --- Professional ---                           [phone], Esophagogastroduodenoscopy, flexible,  transoral; diagnostic, including collection of                            specimen(s) by brushing or washing, when performed                            (separate procedure)                           G0500, Moderate sedation services provided by the                            same physician or other qualified health care                            professional performing a gastrointestinal                            endoscopic service that sedation supports,                            requiring the presence of an independent trained                            observer to assist in the monitoring of the                            patient's level of consciousness and physiological                            status; initial 15 minutes of intra-service time;                            patient age 50 years or older (additional time may                            be reported with [phone], as appropriate) Diagnosis Code(s):        --- Professional ---                           K22.10, Ulcer of esophagus without bleeding                            K21.0, Gastro-esophageal reflux disease with                            esophagitis                           K44.9, Diaphragmatic hernia without obstruction or                            gangrene                           K31.89, Other diseases of stomach and duodenum  K92.0, Hematemesis CPT copyright 2018 [employer]. All rights reserved. The codes documented in this report are preliminary and upon coder review may  be revised to meet current compliance requirements. Hildred Laser, MD Hildred Laser, MD 03/01/2018 3:44:27 PM This report has been signed electronically. Number of Addenda: 0

## 2018-03-01 NOTE — ED Notes (Signed)
Dr Laural Golden in to see

## 2018-03-01 NOTE — Telephone Encounter (Signed)
Last filled 01/31/2018, 30 days will be 03/02/2018.Marland KitchenMarland KitchenMarland Kitchen please advise

## 2018-03-01 NOTE — Progress Notes (Signed)
PROGRESS NOTE  Kaniel Kiang  WER:[id number]  DOB: [birth year]  DOA: 02/28/2018 PCP: [name], NP  Brief Admission Hx: 68 year old male heavy NSAID user with history of COPD, hypertension, epilepsy who presented to the emergency department with hematemesis and black stools.  MDM/Assessment & Plan:   1. Acute upper GI bleed-patient endorses heavy NSAID use.  He has been hemodynamically stabilized now and has been improving on IV Protonix.  Continue supportive therapy.  He is status post 1 unit of PRBCs and his hemoglobin is 9.  He feels better.  Continue n.p.o. status pending GI evaluation for EGD. 2. COPD-he was strongly advised to stop all tobacco use he is a heavy tobacco user.  Will offer a nicotine patch to help with cravings, continue duo nebs and home respiratory medications. 3. Epilepsy- he has been transitioned to IV phenytoin while n.p.o.  Seizure precautions. 4. Essential hypertension-he was initially hypotensive and we are temporarily holding his home losartan.  DVT prophylaxis: SCDs Code Status: Full Family Communication: Wife present at bedside Disposition Plan: Inpatient for treatment, subspecialty consultation   Consultants:  GI Dr. Laural Golden  Procedures:  EGD pending  Antimicrobials:  N/A  Subjective: Patient says that he feels much better this morning he has had no emesis since admission.  Objective: Vitals:   03/01/18 0700 03/01/18 0730 03/01/18 0738 03/01/18 0830  BP: 108/89 110/72 110/72 138/89  Pulse: 98 86 84 88  Resp: 15 13 15 13   Temp:      TempSrc:      SpO2: 98% 97% 95% 100%  Weight:      Height:        Intake/Output Summary (Last 24 hours) at 03/01/2018 0913 Last data filed at 03/01/2018 1950 Gross per 24 hour  Intake 1780 ml  Output 640 ml  Net 1140 ml   Filed Weights   02/28/18 1742  Weight: 56.7 kg     REVIEW OF SYSTEMS  As per history otherwise all reviewed and reported negative  Exam:  General exam: Thin male,  appears older than stated age, awake alert in no distress. Respiratory system: Bilateral expiratory wheezes heard.  No increased work of breathing. Cardiovascular system: S1 & S2 heard, RRR. No JVD, murmurs, gallops, clicks or pedal edema. Gastrointestinal system: Abdomen is nondistended, soft and nontender. Normal bowel sounds heard. Central nervous system: Alert and oriented. No focal neurological deficits. Extremities: no CCE.  Data Reviewed: Basic Metabolic Panel: Recent Labs  Lab 02/28/18 1804  NA 139  K 4.7  CL 108  CO2 21*  GLUCOSE 226*  BUN 41*  CREATININE 0.86  CALCIUM 7.9*   Liver Function Tests: Recent Labs  Lab 02/28/18 1804  AST 23  ALT 18  ALKPHOS 48  BILITOT 0.4  PROT 5.7*  ALBUMIN 3.2*   No results for input(s): LIPASE, AMYLASE in the last 168 hours. No results for input(s): AMMONIA in the last 168 hours. CBC: Recent Labs  Lab 02/28/18 1804 02/28/18 2220 03/01/18 0515  WBC 13.2* 8.4 7.4  NEUTROABS 9.9*  --   --   HGB 8.8* 7.3* 9.9*  HCT 29.3* 23.3* 29.6*  MCV 101.7* 100.4* 92.8  PLT 290 204 167   Cardiac Enzymes: No results for input(s): CKTOTAL, CKMB, CKMBINDEX, TROPONINI in the last 168 hours. CBG (last 3)  No results for input(s): GLUCAP in the last 72 hours. No results found for this or any previous visit (from the past 240 hour(s)).   Studies: No results found.  Scheduled Meds: .  mometasone-formoterol  2 puff Inhalation BID  . phenytoin (DILANTIN) IV  100 mg Intravenous Q8H   Continuous Infusions: . sodium chloride 100 mL/hr at 03/01/18 0330  . pantoprozole (PROTONIX) infusion 8 mg/hr (03/01/18 0148)    Active Problems:   GI bleed   Upper GI bleed   Time spent:   Irwin Brakeman, MD [facility] 03/01/2018, 9:13 AM    LOS: 0 days

## 2018-03-02 ENCOUNTER — Encounter (HOSPITAL_COMMUNITY): Payer: Self-pay | Admitting: Internal Medicine

## 2018-03-02 DIAGNOSIS — K920 Hematemesis: Secondary | ICD-10-CM

## 2018-03-02 DIAGNOSIS — D649 Anemia, unspecified: Secondary | ICD-10-CM

## 2018-03-02 DIAGNOSIS — J449 Chronic obstructive pulmonary disease, unspecified: Secondary | ICD-10-CM

## 2018-03-02 DIAGNOSIS — K210 Gastro-esophageal reflux disease with esophagitis: Secondary | ICD-10-CM

## 2018-03-02 LAB — COMPREHENSIVE METABOLIC PANEL
ALT: 15 U/L (ref 0–44)
AST: 18 U/L (ref 15–41)
Albumin: 3.1 g/dL — ABNORMAL LOW (ref 3.5–5.0)
Alkaline Phosphatase: 43 U/L (ref 38–126)
Anion gap: 5 (ref 5–15)
BILIRUBIN TOTAL: 0.4 mg/dL (ref 0.3–1.2)
BUN: 14 mg/dL (ref 8–23)
CO2: 23 mmol/L (ref 22–32)
Calcium: 8.1 mg/dL — ABNORMAL LOW (ref 8.9–10.3)
Chloride: 113 mmol/L — ABNORMAL HIGH (ref 98–111)
Creatinine, Ser: 0.5 mg/dL — ABNORMAL LOW (ref 0.61–1.24)
GFR calc Af Amer: >60 mL/min (ref >60)
Glucose, Bld: 102 mg/dL — ABNORMAL HIGH (ref 70–99)
Potassium: 3.6 mmol/L (ref 3.5–5.1)
Sodium: 141 mmol/L (ref 135–145)
Total Protein: 5.4 g/dL — ABNORMAL LOW (ref 6.5–8.1)

## 2018-03-02 LAB — BPAM RBC
BLOOD PRODUCT EXPIRATION DATE: 202002182359
Blood Product Expiration Date: 202002182359
ISSUE DATE / TIME: 202001142233
ISSUE DATE / TIME: 202001150104
UNIT TYPE AND RH: 5100
Unit Type and Rh: 5100

## 2018-03-02 LAB — CBC
HEMATOCRIT: 30.2 % — AB (ref 39.0–52.0)
Hemoglobin: 9.7 g/dL — ABNORMAL LOW (ref 13.0–17.0)
MCH: 30.9 pg (ref 26.0–34.0)
MCHC: 32.1 g/dL (ref 30.0–36.0)
MCV: 96.2 fL (ref 80.0–100.0)
Platelets: 145 10*3/uL — ABNORMAL LOW (ref 150–400)
RBC: 3.14 MIL/uL — ABNORMAL LOW (ref 4.22–5.81)
RDW: 16.1 % — ABNORMAL HIGH (ref 11.5–15.5)
WBC: 9 10*3/uL (ref 4.0–10.5)
nRBC: 0 % (ref 0.0–0.2)

## 2018-03-02 LAB — HIV ANTIBODY (ROUTINE TESTING W REFLEX): HIV Screen 4th Generation wRfx: NONREACTIVE

## 2018-03-02 LAB — TYPE AND SCREEN
ABO/RH(D): O POS
Antibody Screen: NEGATIVE
Unit division: 0
Unit division: 0

## 2018-03-02 LAB — MAGNESIUM: MAGNESIUM: 2.1 mg/dL (ref 1.7–2.4)

## 2018-03-02 MED ORDER — PANTOPRAZOLE SODIUM 40 MG PO TBEC
40.0000 mg | DELAYED_RELEASE_TABLET | Freq: Two times a day (BID) | ORAL | Status: DC
  Administered 2018-03-02 – 2018-03-03 (×3): 40 mg via ORAL
  Filled 2018-03-02 (×3): qty 1

## 2018-03-02 MED ORDER — PHENYTOIN SODIUM EXTENDED 100 MG PO CAPS
300.0000 mg | ORAL_CAPSULE | Freq: Every day | ORAL | Status: DC
  Administered 2018-03-03: 300 mg via ORAL
  Filled 2018-03-02: qty 3

## 2018-03-02 MED ORDER — ALPRAZOLAM 0.5 MG PO TABS
0.5000 mg | ORAL_TABLET | Freq: Two times a day (BID) | ORAL | Status: DC | PRN
  Administered 2018-03-02: 0.5 mg via ORAL
  Filled 2018-03-02: qty 1

## 2018-03-02 NOTE — Progress Notes (Signed)
PROGRESS NOTE  Elijah Mcfarland  FUX:[id number]  DOB: 02/13/1951  DOA: 02/28/2018 PCP: [name], NP  Brief Admission Hx: 68 year old male heavy NSAID user with history of COPD, hypertension, epilepsy who presented to the emergency department with hematemesis and black stools.  MDM/Assessment & Plan:   1. Acute upper GI bleed-patient endorses heavy NSAID use.  He has been hemodynamically stabilized now and has been improving on IV Protonix.  Continue supportive therapy.  He is status post 1 unit of PRBCs and his hemoglobin is stable at 9.  He feels better.  Seen by GI and underwent EGD which showed reflux esophagitis and nonbleeding erosive gastropathy.  Also, nonbleeding esophageal ulcer.  Recommendations were to continue on PPI and avoid NSAIDs.  Diet is being advanced to solid foods. 2. COPD-he was strongly advised to stop all tobacco use he is a heavy tobacco user.  Will offer a nicotine patch to help with cravings, continue duo nebs and home respiratory medications. 3. Epilepsy- resume oral phenytoin. 4. Essential hypertension-he was initially hypotensive and we are temporarily holding his home losartan.  DVT prophylaxis: SCDs Code Status: Full Family Communication: Wife present at bedside Disposition Plan: Discharge home in a.m. if tolerates solid diet   Consultants:  GI Dr. Laural Golden  Procedures: EGD:- Non-bleeding esophageal ulcer. Possible pill                            esophagitis.                           - LA Grade A reflux esophagitis.                           - 2 cm hiatal hernia.                           - Non-bleeding erosive gastropathy.                           - Normal duodenal bulb, second portion of the                             duodenum and third portion of the duodenum.  Antimicrobials:  N/A  Subjective: Feeling better.  No further dark stools.  No vomiting.  Objective: Vitals:   03/01/18 1941 03/01/18 2245 03/02/18 0525 03/02/18 0740  BP:   123/82 122/86   Pulse:  82 78   Resp:  16 16   Temp:  98.4 F (36.9 C) 98.3 F (36.8 C)   TempSrc:  Oral Oral   SpO2: 95% 99% 98% 98%  Weight:      Height:        Intake/Output Summary (Last 24 hours) at 03/02/2018 1650 Last data filed at 03/02/2018 0200 Gross per 24 hour  Intake 427.83 ml  Output 550 ml  Net -122.17 ml   Filed Weights   02/28/18 1742 03/01/18 1419 03/01/18 1612  Weight: 56.7 kg 56.7 kg 59.2 kg     REVIEW OF SYSTEMS  As per history otherwise all reviewed and reported negative  Exam:  General exam: Alert, awake, oriented x 3 Respiratory system: Clear to auscultation. Respiratory effort normal. Cardiovascular system:RRR. No murmurs, rubs, gallops. Gastrointestinal system: Abdomen is nondistended, soft and  nontender. No organomegaly or masses felt. Normal bowel sounds heard. Central nervous system: Alert and oriented. No focal neurological deficits. Extremities: No C/C/E, +pedal pulses Skin: No rashes, lesions or ulcers Psychiatry: Judgement and insight appear normal. Mood & affect appropriate.    Data Reviewed: Basic Metabolic Panel: Recent Labs  Lab 02/28/18 1804 03/02/18 0552  NA 139 141  K 4.7 3.6  CL 108 113*  CO2 21* 23  GLUCOSE 226* 102*  BUN 41* 14  CREATININE 0.86 0.50*  CALCIUM 7.9* 8.1*  MG  --  2.1   Liver Function Tests: Recent Labs  Lab 02/28/18 1804 03/02/18 0552  AST 23 18  ALT 18 15  ALKPHOS 48 43  BILITOT 0.4 0.4  PROT 5.7* 5.4*  ALBUMIN 3.2* 3.1*   No results for input(s): LIPASE, AMYLASE in the last 168 hours. No results for input(s): AMMONIA in the last 168 hours. CBC: Recent Labs  Lab 02/28/18 1804 02/28/18 2220 03/01/18 0515 03/02/18 0552  WBC 13.2* 8.4 7.4 9.0  NEUTROABS 9.9*  --   --   --   HGB 8.8* 7.3* 9.9* 9.7*  HCT 29.3* 23.3* 29.6* 30.2*  MCV 101.7* 100.4* 92.8 96.2  PLT 290 204 167 145*   Cardiac Enzymes: No results for input(s): CKTOTAL, CKMB, CKMBINDEX, TROPONINI in the last 168  hours. CBG (last 3)  No results for input(s): GLUCAP in the last 72 hours. No results found for this or any previous visit (from the past 240 hour(s)).   Studies: No results found.  Scheduled Meds: . Influenza vac split quadrivalent PF  0.5 mL Intramuscular Tomorrow-1000  . mometasone-formoterol  2 puff Inhalation BID  . nicotine  21 mg Transdermal Daily  . pantoprazole  40 mg Oral BID AC  . phenytoin (DILANTIN) IV  100 mg Intravenous Q8H  . pneumococcal 23 valent vaccine  0.5 mL Intramuscular Tomorrow-1000   Continuous Infusions: . sodium chloride 20 mL/hr at 03/01/18 1630    Active Problems:   TOBACCO ABUSE   Generalized convulsive epilepsy (HCC)   COPD (chronic obstructive pulmonary disease) (HCC)   GI bleed   Upper GI bleed   NSAID induced gastritis   NSAID long-term use   Melena   Hematemesis with nausea   GERD (gastroesophageal reflux disease)   Time spent: 38mins  Kathie Dike, MD [facility] 03/02/2018, 4:50 PM    LOS: 1 day

## 2018-03-02 NOTE — Progress Notes (Addendum)
Patient ID: Elijah Mcfarland, male   DOB: [birth year], 68 y.o.   MRN: [id number] Sitting at side of bed. Family in room. No GI complaints. Blood pressure 122/86, pulse 78, temperature 98.3 F (36.8 C), temperature source Oral, resp. rate 16, height 5\' 8"  (1.727 m), weight 59.2 kg, SpO2 98 %. Assessment/Plan: UGIB. Will change Protonix to po after am dose in. May be discharged tomorrow.  F/u in our office in 8 weeks.   GI attending note; H&H is 9.7 and 30.2 H. pylori serology pending. Diet advanced to heart healthy diet. Change pantoprazole infusion to oral pantoprazole when present bag runs out. CBC in a.m.

## 2018-03-03 DIAGNOSIS — Z23 Encounter for immunization: Secondary | ICD-10-CM | POA: Diagnosis not present

## 2018-03-03 LAB — COMPREHENSIVE METABOLIC PANEL
ALT: 18 U/L (ref 0–44)
AST: 22 U/L (ref 15–41)
Albumin: 3.2 g/dL — ABNORMAL LOW (ref 3.5–5.0)
Alkaline Phosphatase: 46 U/L (ref 38–126)
Anion gap: 8 (ref 5–15)
BUN: 7 mg/dL — ABNORMAL LOW (ref 8–23)
CALCIUM: 8.2 mg/dL — AB (ref 8.9–10.3)
CO2: 24 mmol/L (ref 22–32)
Chloride: 109 mmol/L (ref 98–111)
Creatinine, Ser: 0.59 mg/dL — ABNORMAL LOW (ref 0.61–1.24)
GFR calc non Af Amer: >60 mL/min (ref >60)
Glucose, Bld: 95 mg/dL (ref 70–99)
Potassium: 3.5 mmol/L (ref 3.5–5.1)
Sodium: 141 mmol/L (ref 135–145)
Total Bilirubin: 0.5 mg/dL (ref 0.3–1.2)
Total Protein: 5.5 g/dL — ABNORMAL LOW (ref 6.5–8.1)

## 2018-03-03 LAB — CBC
HCT: 29 % — ABNORMAL LOW (ref 39.0–52.0)
Hemoglobin: 9.4 g/dL — ABNORMAL LOW (ref 13.0–17.0)
MCH: 30.7 pg (ref 26.0–34.0)
MCHC: 32.4 g/dL (ref 30.0–36.0)
MCV: 94.8 fL (ref 80.0–100.0)
Platelets: 141 10*3/uL — ABNORMAL LOW (ref 150–400)
RBC: 3.06 MIL/uL — ABNORMAL LOW (ref 4.22–5.81)
RDW: 15.7 % — ABNORMAL HIGH (ref 11.5–15.5)
WBC: 6.4 10*3/uL (ref 4.0–10.5)
nRBC: 0 % (ref 0.0–0.2)

## 2018-03-03 LAB — H. PYLORI ANTIBODY, IGG: H Pylori IgG: 3.68 Index Value — ABNORMAL HIGH (ref 0.00–0.79)

## 2018-03-03 MED ORDER — PANTOPRAZOLE SODIUM 40 MG PO TBEC: 40.0000 mg | DELAYED_RELEASE_TABLET | Freq: Two times a day (BID) | ORAL | 1 refills | Status: DC

## 2018-03-03 MED ORDER — ASPIRIN 81 MG PO TABS: 81.0000 mg | ORAL_TABLET | Freq: Every morning | ORAL | 0 refills | Status: DC

## 2018-03-03 NOTE — Progress Notes (Signed)
Reviewed d/c medication with patient and spouse. Reviewed new medications and medication changes. Stable patient and belongings walked with wife to main entrance.

## 2018-03-03 NOTE — Discharge Summary (Addendum)
Physician Discharge Summary  Elijah Mcfarland QAS:[id number] DOB: March 28, 1950 DOA: 02/28/2018  PCP: [name], NP  Admit date: 02/28/2018 Discharge date: 03/03/2018  Admitted From: Home Disposition: Home  Recommendations for Outpatient Follow-up:  1. Follow up with PCP in 1-2 weeks 2. Please obtain BMP/CBC in one week  Discharge Condition: Stable CODE STATUS: Full code Diet recommendation: Heart healthy  Brief/Interim Summary: 68 year old male who chronically uses NSAIDs, presented to the hospital with hematemesis and black-colored stool.  He was admitted to the hospital for upper GI bleeding.  He was started on intravenous Protonix.  He was seen by gastroenterology and underwent EGD that showed reflux esophagitis and nonbleeding erosive gastropathy.  There was also noted to be a nonbleeding esophageal ulcer.  Recommendations were to continue PPI and avoid all further NSAIDs.  He did require transfusion of 1 unit of PRBC and hemoglobin since transfusion has remained stable.  Overall GI bleeding has resolved and last stool was noted to be normal.  Patient be continued on PPI on discharge.  He will follow-up with GI in the next 8 weeks  Discharge Diagnoses:  Active Problems:   TOBACCO ABUSE   Generalized convulsive epilepsy ([place])   COPD (chronic obstructive pulmonary disease) (HCC)   GI bleed   Upper GI bleed   NSAID induced gastritis   NSAID long-term use   Melena   Hematemesis with nausea   GERD (gastroesophageal reflux disease)   Acute blood loss anemia   Discharge Instructions  Discharge Instructions    Diet - low sodium heart healthy   Complete by:  As directed    Increase activity slowly   Complete by:  As directed      Allergies as of 03/03/2018   No Known Allergies     Medication List    STOP taking these medications   meloxicam 15 MG tablet Commonly known as:  MOBIC     TAKE these medications   albuterol 108 (90 Base) MCG/ACT inhaler Commonly known as:   PROVENTIL HFA;VENTOLIN HFA Inhale 1-2 puffs into the lungs every 6 (six) hours as needed for wheezing or shortness of breath.   ALPRAZolam 0.5 MG tablet Commonly known as:  XANAX TAKE 1 TABLET(0.5 MG) BY MOUTH TWICE DAILY AS NEEDED What changed:  See the new instructions.   aspirin 81 MG tablet Take 1 tablet (81 mg total) by mouth every morning. Restart in 7 days What changed:  additional instructions   atorvastatin 10 MG tablet Commonly known as:  LIPITOR TAKE 1 TABLET EVERY DAY AT 6 PM. What changed:  See the new instructions.   citalopram 40 MG tablet Commonly known as:  CELEXA TAKE 1 TABLET EVERY DAY What changed:  when to take this   diphenhydramine-acetaminophen 25-500 MG Tabs tablet Commonly known as:  TYLENOL PM Take 1-2 tablets by mouth at bedtime.   Fluticasone-Salmeterol 500-50 MCG/DOSE Aepb Commonly known as:  ADVAIR Inhale 1 puff into the lungs 2 (two) times daily.   losartan 50 MG tablet Commonly known as:  COZAAR TAKE 1 TABLET EVERY DAY What changed:  when to take this   pantoprazole 40 MG tablet Commonly known as:  PROTONIX Take 1 tablet (40 mg total) by mouth 2 (two) times daily before a meal.   phenytoin 100 MG ER capsule Commonly known as:  DILANTIN TAKE 3 CAPSULES EVERY DAY What changed:  See the new instructions.       No Known Allergies  Consultations: Gastroenterology  Procedures/Studies: EGD:- Non-bleeding esophageal ulcer.  Possible pill  esophagitis. - LA Grade A reflux esophagitis. - 2 cm hiatal hernia. - Non-bleeding erosive gastropathy. - Normal duodenal bulb, second portion of the   duodenum and third portion of the duodenum.   Subjective: No further GI bleeding.  Stool was noted to be normal  Discharge Exam: Vitals:   03/02/18 0525 03/02/18 0740 03/02/18 2034  03/03/18 0935  BP: 122/86     Pulse: 78  86   Resp: 16  18   Temp: 98.3 F (36.8 C)     TempSrc: Oral     SpO2: 98% 98% 96% 100%  Weight:      Height:        General: Pt is alert, awake, not in acute distress Cardiovascular: RRR, S1/S2 +, no rubs, no gallops Respiratory: CTA bilaterally, no wheezing, no rhonchi Abdominal: Soft, NT, ND, bowel sounds + Extremities: no edema, no cyanosis    The results of significant diagnostics from this hospitalization (including imaging, microbiology, ancillary and laboratory) are listed below for reference.     Microbiology: No results found for this or any previous visit (from the past 240 hour(s)).   Labs: BNP (last 3 results) No results for input(s): BNP in the last 8760 hours. Basic Metabolic Panel: Recent Labs  Lab 02/28/18 1804 03/02/18 0552 03/03/18 0453  NA 139 141 141  K 4.7 3.6 3.5  CL 108 113* 109  CO2 21* 23 24  GLUCOSE 226* 102* 95  BUN 41* 14 7*  CREATININE 0.86 0.50* 0.59*  CALCIUM 7.9* 8.1* 8.2*  MG  --  2.1  --    Liver Function Tests: Recent Labs  Lab 02/28/18 1804 03/02/18 0552 03/03/18 0453  AST 23 18 22   ALT 18 15 18   ALKPHOS 48 43 46  BILITOT 0.4 0.4 0.5  PROT 5.7* 5.4* 5.5*  ALBUMIN 3.2* 3.1* 3.2*   No results for input(s): LIPASE, AMYLASE in the last 168 hours. No results for input(s): AMMONIA in the last 168 hours. CBC: Recent Labs  Lab 02/28/18 1804 02/28/18 2220 03/01/18 0515 03/02/18 0552 03/03/18 0453  WBC 13.2* 8.4 7.4 9.0 6.4  NEUTROABS 9.9*  --   --   --   --   HGB 8.8* 7.3* 9.9* 9.7* 9.4*  HCT 29.3* 23.3* 29.6* 30.2* 29.0*  MCV 101.7* 100.4* 92.8 96.2 94.8  PLT 290 204 167 145* 141*   Cardiac Enzymes: No results for input(s): CKTOTAL, CKMB, CKMBINDEX, TROPONINI in the last 168 hours. BNP: Invalid input(s): POCBNP CBG: No results for input(s): GLUCAP in the last 168 hours. D-Dimer No results for input(s): DDIMER in the last 72 hours. Hgb A1c No results for input(s):  HGBA1C in the last 72 hours. Lipid Profile No results for input(s): CHOL, HDL, LDLCALC, TRIG, CHOLHDL, LDLDIRECT in the last 72 hours. Thyroid function studies No results for input(s): TSH, T4TOTAL, T3FREE, THYROIDAB in the last 72 hours.  Invalid input(s): FREET3 Anemia work up No results for input(s): VITAMINB12, FOLATE, FERRITIN, TIBC, IRON, RETICCTPCT in the last 72 hours. Urinalysis No results found for: COLORURINE, APPEARANCEUR, LABSPEC, [place], GLUCOSEU, HGBUR, BILIRUBINUR, KETONESUR, PROTEINUR, UROBILINOGEN, NITRITE, LEUKOCYTESUR Sepsis Labs Invalid input(s): PROCALCITONIN,  WBC,  LACTICIDVEN Microbiology No results found for this or any previous visit (from the past 240 hour(s)).   Time coordinating discharge: 33mins  SIGNED:   Kathie Dike, MD  [facility] 03/03/2018, 7:54 PM   If 7PM-7AM, please contact night-coverage [email or web address]

## 2018-03-06 ENCOUNTER — Encounter (HOSPITAL_COMMUNITY): Payer: Self-pay | Admitting: Emergency Medicine

## 2018-03-06 ENCOUNTER — Encounter (HOSPITAL_COMMUNITY)
Admission: EM | Disposition: A | Discharge status: Home / Self Care | Payer: Self-pay | Source: Home / Self Care | Attending: Family Medicine

## 2018-03-06 ENCOUNTER — Other Ambulatory Visit: Payer: Self-pay

## 2018-03-06 ENCOUNTER — Inpatient Hospital Stay (HOSPITAL_COMMUNITY)
Admission: EM | Admit: 2018-03-06 | Discharge: 2018-03-10 | DRG: 378 | Disposition: A | Discharge status: Home / Self Care | Payer: Medicare HMO | Attending: Family Medicine | Admitting: Family Medicine

## 2018-03-06 DIAGNOSIS — I700 Atherosclerosis of aorta: Secondary | ICD-10-CM | POA: Diagnosis not present

## 2018-03-06 DIAGNOSIS — Z7982 Long term (current) use of aspirin: Secondary | ICD-10-CM

## 2018-03-06 DIAGNOSIS — R110 Nausea: Secondary | ICD-10-CM | POA: Diagnosis not present

## 2018-03-06 DIAGNOSIS — Z716 Tobacco abuse counseling: Secondary | ICD-10-CM

## 2018-03-06 DIAGNOSIS — K922 Gastrointestinal hemorrhage, unspecified: Principal | ICD-10-CM | POA: Diagnosis present

## 2018-03-06 DIAGNOSIS — G40909 Epilepsy, unspecified, not intractable, without status epilepticus: Secondary | ICD-10-CM | POA: Diagnosis present

## 2018-03-06 DIAGNOSIS — B9681 Helicobacter pylori [H. pylori] as the cause of diseases classified elsewhere: Secondary | ICD-10-CM | POA: Diagnosis present

## 2018-03-06 DIAGNOSIS — F1290 Cannabis use, unspecified, uncomplicated: Secondary | ICD-10-CM | POA: Diagnosis present

## 2018-03-06 DIAGNOSIS — Z79899 Other long term (current) drug therapy: Secondary | ICD-10-CM

## 2018-03-06 DIAGNOSIS — K449 Diaphragmatic hernia without obstruction or gangrene: Secondary | ICD-10-CM | POA: Diagnosis present

## 2018-03-06 DIAGNOSIS — I10 Essential (primary) hypertension: Secondary | ICD-10-CM | POA: Diagnosis present

## 2018-03-06 DIAGNOSIS — R000 Tachycardia, unspecified: Secondary | ICD-10-CM | POA: Diagnosis not present

## 2018-03-06 DIAGNOSIS — K3189 Other diseases of stomach and duodenum: Secondary | ICD-10-CM | POA: Diagnosis not present

## 2018-03-06 DIAGNOSIS — E782 Mixed hyperlipidemia: Secondary | ICD-10-CM | POA: Diagnosis present

## 2018-03-06 DIAGNOSIS — Z7951 Long term (current) use of inhaled steroids: Secondary | ICD-10-CM | POA: Diagnosis not present

## 2018-03-06 DIAGNOSIS — G894 Chronic pain syndrome: Secondary | ICD-10-CM | POA: Diagnosis present

## 2018-03-06 DIAGNOSIS — K219 Gastro-esophageal reflux disease without esophagitis: Secondary | ICD-10-CM | POA: Diagnosis present

## 2018-03-06 DIAGNOSIS — Z8249 Family history of ischemic heart disease and other diseases of the circulatory system: Secondary | ICD-10-CM | POA: Diagnosis not present

## 2018-03-06 DIAGNOSIS — K228 Other specified diseases of esophagus: Secondary | ICD-10-CM | POA: Diagnosis not present

## 2018-03-06 DIAGNOSIS — J449 Chronic obstructive pulmonary disease, unspecified: Secondary | ICD-10-CM | POA: Diagnosis present

## 2018-03-06 DIAGNOSIS — I959 Hypotension, unspecified: Secondary | ICD-10-CM | POA: Diagnosis not present

## 2018-03-06 DIAGNOSIS — F17210 Nicotine dependence, cigarettes, uncomplicated: Secondary | ICD-10-CM | POA: Diagnosis present

## 2018-03-06 DIAGNOSIS — F411 Generalized anxiety disorder: Secondary | ICD-10-CM | POA: Diagnosis present

## 2018-03-06 DIAGNOSIS — F17200 Nicotine dependence, unspecified, uncomplicated: Secondary | ICD-10-CM | POA: Diagnosis not present

## 2018-03-06 DIAGNOSIS — H3530 Unspecified macular degeneration: Secondary | ICD-10-CM | POA: Diagnosis present

## 2018-03-06 DIAGNOSIS — F329 Major depressive disorder, single episode, unspecified: Secondary | ICD-10-CM | POA: Diagnosis present

## 2018-03-06 DIAGNOSIS — R918 Other nonspecific abnormal finding of lung field: Secondary | ICD-10-CM | POA: Diagnosis present

## 2018-03-06 DIAGNOSIS — D696 Thrombocytopenia, unspecified: Secondary | ICD-10-CM | POA: Diagnosis present

## 2018-03-06 DIAGNOSIS — D62 Acute posthemorrhagic anemia: Secondary | ICD-10-CM | POA: Diagnosis present

## 2018-03-06 DIAGNOSIS — K2960 Other gastritis without bleeding: Secondary | ICD-10-CM | POA: Diagnosis present

## 2018-03-06 DIAGNOSIS — K920 Hematemesis: Secondary | ICD-10-CM | POA: Diagnosis not present

## 2018-03-06 DIAGNOSIS — Z720 Tobacco use: Secondary | ICD-10-CM | POA: Diagnosis present

## 2018-03-06 DIAGNOSIS — K2970 Gastritis, unspecified, without bleeding: Secondary | ICD-10-CM | POA: Diagnosis not present

## 2018-03-06 HISTORY — PX: ESOPHAGOGASTRODUODENOSCOPY: SHX5428

## 2018-03-06 LAB — COMPREHENSIVE METABOLIC PANEL
ALT: 13 U/L (ref 0–44)
ANION GAP: 6 (ref 5–15)
AST: 15 U/L (ref 15–41)
Albumin: 2.7 g/dL — ABNORMAL LOW (ref 3.5–5.0)
Alkaline Phosphatase: 35 U/L — ABNORMAL LOW (ref 38–126)
BUN: 35 mg/dL — ABNORMAL HIGH (ref 8–23)
CO2: 23 mmol/L (ref 22–32)
Calcium: 7.6 mg/dL — ABNORMAL LOW (ref 8.9–10.3)
Chloride: 109 mmol/L (ref 98–111)
Creatinine, Ser: 0.77 mg/dL (ref 0.61–1.24)
GFR calc Af Amer: >60 mL/min (ref >60)
GFR calc non Af Amer: >60 mL/min (ref >60)
GLUCOSE: 150 mg/dL — AB (ref 70–99)
Potassium: 4.7 mmol/L (ref 3.5–5.1)
Sodium: 138 mmol/L (ref 135–145)
Total Bilirubin: 0.3 mg/dL (ref 0.3–1.2)
Total Protein: 5.1 g/dL — ABNORMAL LOW (ref 6.5–8.1)

## 2018-03-06 LAB — LIPASE, BLOOD: Lipase: 38 U/L (ref 11–51)

## 2018-03-06 LAB — CBC WITH DIFFERENTIAL/PLATELET
Abs Immature Granulocytes: 0.1 10*3/uL — ABNORMAL HIGH (ref 0.00–0.07)
BASOS ABS: 0 10*3/uL (ref 0.0–0.1)
Basophils Relative: 0 %
Eosinophils Absolute: 0.3 10*3/uL (ref 0.0–0.5)
Eosinophils Relative: 3 %
HCT: 20.1 % — ABNORMAL LOW (ref 39.0–52.0)
Hemoglobin: 6.5 g/dL — CL (ref 13.0–17.0)
Immature Granulocytes: 1 %
Lymphocytes Relative: 9 %
Lymphs Abs: 1 10*3/uL (ref 0.7–4.0)
MCH: 32.2 pg (ref 26.0–34.0)
MCHC: 32.3 g/dL (ref 30.0–36.0)
MCV: 99.5 fL (ref 80.0–100.0)
Monocytes Absolute: 1 10*3/uL (ref 0.1–1.0)
Monocytes Relative: 10 %
NRBC: 0 % (ref 0.0–0.2)
Neutro Abs: 8.3 10*3/uL — ABNORMAL HIGH (ref 1.7–7.7)
Neutrophils Relative %: 77 %
Platelets: 222 10*3/uL (ref 150–400)
RBC: 2.02 MIL/uL — ABNORMAL LOW (ref 4.22–5.81)
RDW: 16.4 % — ABNORMAL HIGH (ref 11.5–15.5)
WBC: 10.7 10*3/uL — ABNORMAL HIGH (ref 4.0–10.5)

## 2018-03-06 LAB — PREPARE RBC (CROSSMATCH)

## 2018-03-06 SURGERY — EGD (ESOPHAGOGASTRODUODENOSCOPY)
Anesthesia: Moderate Sedation

## 2018-03-06 MED ORDER — PANTOPRAZOLE SODIUM 40 MG IV SOLR
40.0000 mg | Freq: Once | INTRAVENOUS | Status: AC
  Administered 2018-03-06: 40 mg via INTRAVENOUS
  Filled 2018-03-06: qty 40

## 2018-03-06 MED ORDER — LIDOCAINE VISCOUS HCL 2 % MT SOLN
OROMUCOSAL | Status: AC
  Filled 2018-03-06: qty 15

## 2018-03-06 MED ORDER — ALPRAZOLAM 0.5 MG PO TABS
0.5000 mg | ORAL_TABLET | Freq: Two times a day (BID) | ORAL | Status: DC | PRN
  Administered 2018-03-06 – 2018-03-10 (×8): 0.5 mg via ORAL
  Filled 2018-03-06 (×8): qty 1

## 2018-03-06 MED ORDER — MIDAZOLAM HCL 5 MG/5ML IJ SOLN
INTRAMUSCULAR | Status: DC | PRN
  Administered 2018-03-06: 1 mg via INTRAVENOUS
  Administered 2018-03-06 (×3): 2 mg via INTRAVENOUS

## 2018-03-06 MED ORDER — ACETAMINOPHEN 650 MG RE SUPP: 650.0000 mg | Freq: Four times a day (QID) | RECTAL | Status: DC | PRN

## 2018-03-06 MED ORDER — SODIUM CHLORIDE 0.9 % IV SOLN
INTRAVENOUS | Status: DC
  Administered 2018-03-06 – 2018-03-08 (×3): via INTRAVENOUS

## 2018-03-06 MED ORDER — IPRATROPIUM-ALBUTEROL 0.5-2.5 (3) MG/3ML IN SOLN
3.0000 mL | Freq: Four times a day (QID) | RESPIRATORY_TRACT | Status: DC
  Administered 2018-03-06: 3 mL via RESPIRATORY_TRACT
  Filled 2018-03-06: qty 3

## 2018-03-06 MED ORDER — SODIUM CHLORIDE 0.9 % IV SOLN
10.0000 mL/h | Freq: Once | INTRAVENOUS | Status: AC
  Administered 2018-03-06: 10 mL/h via INTRAVENOUS

## 2018-03-06 MED ORDER — METOCLOPRAMIDE HCL 5 MG/ML IJ SOLN: 10.0000 mg | INTRAMUSCULAR | Status: DC

## 2018-03-06 MED ORDER — ACETAMINOPHEN 325 MG PO TABS
650.0000 mg | ORAL_TABLET | Freq: Four times a day (QID) | ORAL | Status: DC | PRN
  Administered 2018-03-07: 650 mg via ORAL
  Filled 2018-03-06: qty 2

## 2018-03-06 MED ORDER — ONDANSETRON HCL 4 MG/2ML IJ SOLN: 4.0000 mg | Freq: Four times a day (QID) | INTRAMUSCULAR | Status: DC | PRN

## 2018-03-06 MED ORDER — ONDANSETRON HCL 4 MG PO TABS: 4.0000 mg | ORAL_TABLET | Freq: Four times a day (QID) | ORAL | Status: DC | PRN

## 2018-03-06 MED ORDER — CITALOPRAM HYDROBROMIDE 20 MG PO TABS
40.0000 mg | ORAL_TABLET | Freq: Every morning | ORAL | Status: DC
  Administered 2018-03-07 – 2018-03-10 (×5): 40 mg via ORAL
  Filled 2018-03-06 (×4): qty 2

## 2018-03-06 MED ORDER — IPRATROPIUM-ALBUTEROL 0.5-2.5 (3) MG/3ML IN SOLN
3.0000 mL | Freq: Two times a day (BID) | RESPIRATORY_TRACT | Status: DC
  Administered 2018-03-07 – 2018-03-10 (×7): 3 mL via RESPIRATORY_TRACT
  Filled 2018-03-06 (×7): qty 3

## 2018-03-06 MED ORDER — BUDESONIDE 0.5 MG/2ML IN SUSP
0.5000 mg | Freq: Two times a day (BID) | RESPIRATORY_TRACT | Status: DC
  Administered 2018-03-06 – 2018-03-10 (×8): 0.5 mg via RESPIRATORY_TRACT
  Filled 2018-03-06 (×8): qty 2

## 2018-03-06 MED ORDER — LOPERAMIDE HCL 2 MG PO CAPS: 2.0000 mg | ORAL_CAPSULE | Freq: Once | ORAL | Status: DC

## 2018-03-06 MED ORDER — PHENYTOIN SODIUM EXTENDED 100 MG PO CAPS
300.0000 mg | ORAL_CAPSULE | Freq: Every morning | ORAL | Status: DC
  Administered 2018-03-07 – 2018-03-10 (×5): 300 mg via ORAL
  Filled 2018-03-06 (×4): qty 3

## 2018-03-06 MED ORDER — ATORVASTATIN CALCIUM 10 MG PO TABS
10.0000 mg | ORAL_TABLET | Freq: Every day | ORAL | Status: DC
  Administered 2018-03-07 – 2018-03-09 (×3): 10 mg via ORAL
  Filled 2018-03-06 (×3): qty 1

## 2018-03-06 MED ORDER — NICOTINE 21 MG/24HR TD PT24
21.0000 mg | MEDICATED_PATCH | Freq: Every day | TRANSDERMAL | Status: DC
  Administered 2018-03-06 – 2018-03-09 (×4): 21 mg via TRANSDERMAL
  Filled 2018-03-06 (×5): qty 1

## 2018-03-06 MED ORDER — SODIUM CHLORIDE 0.9 % IV SOLN
INTRAVENOUS | Status: DC
  Administered 2018-03-06: 13:00:00 via INTRAVENOUS

## 2018-03-06 MED ORDER — STERILE WATER FOR IRRIGATION IR SOLN
Status: DC | PRN
  Administered 2018-03-06: 1.5 mL

## 2018-03-06 MED ORDER — MEPERIDINE HCL 50 MG/ML IJ SOLN
INTRAMUSCULAR | Status: AC
  Filled 2018-03-06: qty 1

## 2018-03-06 MED ORDER — ORAL CARE MOUTH RINSE
15.0000 mL | Freq: Two times a day (BID) | OROMUCOSAL | Status: DC
  Administered 2018-03-07 – 2018-03-09 (×3): 15 mL via OROMUCOSAL

## 2018-03-06 MED ORDER — PANTOPRAZOLE SODIUM 40 MG IV SOLR: 40.0000 mg | Freq: Two times a day (BID) | INTRAVENOUS | Status: DC

## 2018-03-06 MED ORDER — ONDANSETRON HCL 4 MG/2ML IJ SOLN
4.0000 mg | Freq: Once | INTRAMUSCULAR | Status: AC
  Administered 2018-03-06: 4 mg via INTRAVENOUS
  Filled 2018-03-06: qty 2

## 2018-03-06 MED ORDER — MIDAZOLAM HCL 5 MG/5ML IJ SOLN
INTRAMUSCULAR | Status: AC
  Filled 2018-03-06: qty 10

## 2018-03-06 MED ORDER — LIDOCAINE VISCOUS HCL 2 % MT SOLN
OROMUCOSAL | Status: DC | PRN
  Administered 2018-03-06: 1 via OROMUCOSAL

## 2018-03-06 MED ORDER — SODIUM CHLORIDE 0.9 % IV SOLN
8.0000 mg/h | INTRAVENOUS | Status: DC
  Administered 2018-03-06 – 2018-03-08 (×4): 8 mg/h via INTRAVENOUS
  Filled 2018-03-06 (×8): qty 80

## 2018-03-06 MED ORDER — MEPERIDINE HCL 50 MG/ML IJ SOLN
INTRAMUSCULAR | Status: DC | PRN
  Administered 2018-03-06 (×2): 15 mg via INTRAVENOUS

## 2018-03-06 NOTE — Op Note (Signed)
[facility] Patient Name: Elijah Mcfarland Procedure Date: 03/06/2018 5:18 PM MRN: [id number] Date of Birth: Jun 13, 1950 Attending MD: Hildred Laser , MD CSN: [id number] Age: 68 Admit Type: Outpatient Procedure:                Upper GI endoscopy Indications:              Hematemesis Providers:                Hildred Laser, MD, Lurline Del, RN, Aram Candela Referring MD:             Orson Eva, DO Medicines:                Lidocaine jelly, Meperidine 30 mg IV, Midazolam 7                            mg IV Complications:            No immediate complications. Estimated Blood Loss:     Estimated blood loss: none. Procedure:                Pre-Anesthesia Assessment:                           - Prior to the procedure, a History and Physical                            was performed, and patient medications and                            allergies were reviewed. The patient's tolerance of                            previous anesthesia was also reviewed. The risks                            and benefits of the procedure and the sedation                            options and risks were discussed with the patient.                            All questions were answered, and informed consent                            was obtained. Prior Anticoagulants: The patient has                            taken no previous anticoagulant or antiplatelet                            agents. ASA Grade Assessment: III - A patient with                            severe systemic disease. After reviewing the risks  and benefits, the patient was deemed in                            satisfactory condition to undergo the procedure.                           After obtaining informed consent, the endoscope was                            passed under direct vision. Throughout the                            procedure, the patient's blood pressure, pulse, and                            oxygen  saturations were monitored continuously. The                            [id number]-H190 ([id number]) scope was introduced through the                            mouth, and advanced to the second part of duodenum.                            The upper GI endoscopy was accomplished without                            difficulty. The patient tolerated the procedure                            well. Scope In: 6:03:54 PM Scope Out: 6:14:03 PM Total Procedure Duration: 0 hours 10 minutes 9 seconds  Findings:      The nasopharynx was normal.      The examined esophagus was normal.      The Z-line was irregular and was found 40 cm from the incisors.      A 2 cm hiatal hernia was present.      Clear fluid was found in the gastric body.      Patchy mild inflammation characterized by erythema and granularity was       found in the gastric antrum.      The exam of the stomach was otherwise normal.      The duodenal bulb and second portion of the duodenum were normal. Impression:               - Normal nasopharynx.                           - Normal esophagus.                           - Z-line irregular, 40 cm from the incisors.                           - 2 cm hiatal hernia.                           -  Clear gastric fluid.                           - Non-erosive gastritis.                           - Normal duodenal bulb and second portion of the                            duodenum.                           - No specimens collected.                           Comment: no bleeding lesion identifired. Moderate Sedation:      Moderate (conscious) sedation was administered by the endoscopy nurse       and supervised by the endoscopist. The following parameters were       monitored: oxygen saturation, heart rate, blood pressure, CO2       capnography and response to care. Total physician intraservice time was       17 minutes. Recommendation:           - Return patient to hospital ward for ongoing care.                            - Clear liquid diet today.                           - Continue present medications.                           - CTA Abdomen in am.                           - Repeat EGD urgently if he rebleeds. Procedure Code(s):        --- Professional ---                           [phone], Esophagogastroduodenoscopy, flexible,                            transoral; diagnostic, including collection of                            specimen(s) by brushing or washing, when performed                            (separate procedure)                           G0500, Moderate sedation services provided by the                            same physician or other qualified health care  professional performing a gastrointestinal                            endoscopic service that sedation supports,                            requiring the presence of an independent trained                            observer to assist in the monitoring of the                            patient's level of consciousness and physiological                            status; initial 15 minutes of intra-service time;                            patient age 45 years or older (additional time may                            be reported with [phone], as appropriate) Diagnosis Code(s):        --- Professional ---                           K22.8, Other specified diseases of esophagus                           K44.9, Diaphragmatic hernia without obstruction or                            gangrene                           K29.70, Gastritis, unspecified, without bleeding                           K92.0, Hematemesis CPT copyright 2018 [employer]. All rights reserved. The codes documented in this report are preliminary and upon coder review may  be revised to meet current compliance requirements. Hildred Laser, MD Hildred Laser, MD 03/06/2018 6:31:14 PM This report has been signed  electronically. Number of Addenda: 0

## 2018-03-06 NOTE — ED Provider Notes (Signed)
[facility] EMERGENCY DEPARTMENT Provider Note   CSN: [id number] Arrival date & time: 03/06/18  1108     History   Chief Complaint Chief Complaint  Patient presents with  . Hematemesis    HPI Elijah Mcfarland is a 68 y.o. male.  Patient with 1 episode of a large amount of red blood vomitus today.  Patient was recently admitted for the same problem on January 14 and was discharged home on Friday.  Patient received 1 unit of blood and also had an upper endoscopy done by Dr. Melony Overly.  Without evidence of any active bleeding.  Patient was a heavy NSAID user.  So they put that on hold.  And patient was getting Carafate.  Patient appears pale here today.  Blood pressures were on the soft side and little tachycardic.  Denies any red blood per rectum.  Denies any pain.     Past Medical History:  Diagnosis Date  . Chronic pain syndrome    Left knee  . COPD (chronic obstructive pulmonary disease) ([place])   . DDD (degenerative disc disease), lumbar 11/2008   L5-S1 on plain film  . Emphysema lung ([place])   . GAD (generalized anxiety disorder)   . GERD (gastroesophageal reflux disease)   . History of multiple pulmonary nodules 07/2010   Follow up CT scans showed resolution by 03/2011--NO MALIGNANCY.  Marland Kitchen History of pneumonia 06/2010  . HTN (hypertension) 01/07/2014  . Osteoarthritis of left knee    + past injury of this knee--tibia fracture?  +left leg shorter than right  . Seizure ([place])   . Seizure disorder ([place])    3 total seizures (first was 1995); neuro eval done and recommended lifetime phenytoin (he had a seizure after coming off the med in the late 90s  . Tobacco dependence     Patient Active Problem List   Diagnosis Date Noted  . Upper GI bleed 03/01/2018  . NSAID induced gastritis 03/01/2018  . NSAID long-term use 03/01/2018  . Melena 03/01/2018  . Hematemesis with nausea 03/01/2018  . GERD (gastroesophageal reflux disease) 03/01/2018  . GI bleed 02/28/2018  . Encounter for  screening colonoscopy   . Macular degeneration 08/22/2017  . HTN (hypertension) 01/07/2014  . Prediabetes 04/13/2013  . 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 Anxiety state 04/25/2006  . TOBACCO ABUSE 04/25/2006  . Generalized convulsive epilepsy ([place]) 04/25/2006  . COPD (chronic obstructive pulmonary disease) ([place]) 04/25/2006  . Arthritis 04/25/2006    Past Surgical History:  Procedure Laterality Date  . COLONOSCOPY  2010   At [facility]; normal per pt  . COLONOSCOPY WITH PROPOFOL N/A 09/28/2017   Procedure: COLONOSCOPY WITH PROPOFOL;  Surgeon: [name], MD;  Location: [facility] ENDOSCOPY;  Service: Gastroenterology;  Laterality: N/A;  . COLONOSCOPY WITH PROPOFOL N/A 10/13/2017   Procedure: COLONOSCOPY WITH PROPOFOL;  Surgeon: Lin Landsman, MD;  Location: [facility] ENDOSCOPY;  Service: Gastroenterology;  Laterality: N/A;  . ESOPHAGOGASTRODUODENOSCOPY N/A 03/01/2018   Procedure: ESOPHAGOGASTRODUODENOSCOPY (EGD);  Surgeon: Rogene Houston, MD;  Location: [facility] ENDO SUITE;  Service: Endoscopy;  Laterality: N/A;  . KNEE SURGERY     Left : x 2        Home Medications    Prior to Admission medications   Medication Sig Start Date End Date Taking? Authorizing Provider  albuterol (PROVENTIL HFA;VENTOLIN HFA) 108 (90 Base) MCG/ACT inhaler Inhale 1-2 puffs into the lungs every 6 (six) hours as needed for wheezing or shortness of breath. 03/19/16  Yes [name], NP  ALPRAZolam (  XANAX) 0.5 MG tablet TAKE 1 TABLET(0.5 MG) BY MOUTH TWICE DAILY AS NEEDED 03/01/18  Yes Baity, Coralie Keens, NP  atorvastatin (LIPITOR) 10 MG tablet TAKE 1 TABLET EVERY DAY AT 6 PM. Patient taking differently: Take 10 mg by mouth every morning.  09/20/17  Yes Baity, Coralie Keens, NP  citalopram (CELEXA) 40 MG tablet TAKE 1 TABLET EVERY DAY Patient taking differently: Take 40 mg by mouth every morning.  09/20/17  Yes Baity, Coralie Keens, NP  losartan (COZAAR) 50 MG tablet TAKE 1 TABLET EVERY DAY Patient taking differently: Take 50 mg by mouth  every morning.  09/20/17  Yes Baity, Coralie Keens, NP  nicotine (NICODERM CQ - DOSED IN MG/24 HOURS) 21 mg/24hr patch Place 21 mg onto the skin daily.   Yes [provider]  phenytoin (DILANTIN) 100 MG ER capsule TAKE 3 CAPSULES EVERY DAY Patient taking differently: Take 300 mg by mouth every morning.  09/20/17  Yes [name], NP  aspirin 81 MG tablet Take 1 tablet (81 mg total) by mouth every morning. Restart in 7 days Patient not taking: Reported on 03/06/2018 03/03/18   Kathie Dike, MD  Fluticasone-Salmeterol (ADVAIR) 500-50 MCG/DOSE AEPB Inhale 1 puff into the lungs 2 (two) times daily. Patient not taking: Reported on 02/28/2018 03/19/16   [name], NP  pantoprazole (PROTONIX) 40 MG tablet Take 1 tablet (40 mg total) by mouth 2 (two) times daily before a meal. Patient not taking: Reported on 03/06/2018 03/03/18   Kathie Dike, MD    Family History Family History  Problem Relation Age of Onset  . Heart disease Mother   . Heart disease Father   . Cancer Sister   . Stroke Neg Hx     Social History Social History   Tobacco Use  . Smoking status: Current Every Day Smoker    Packs/day: 1.00    Years: 45.00    Pack years: 45.00    Types: Cigarettes  . Smokeless tobacco: Never Used  Substance Use Topics  . Alcohol use: No    Alcohol/week: 0.0 standard drinks  . Drug use: Not Currently    Types: Marijuana    Comment: 2 month     Allergies   Patient has no known allergies.   Review of Systems Review of Systems  Constitutional: Negative for chills and fever.  HENT: Negative for rhinorrhea and sore throat.   Eyes: Negative for visual disturbance.  Respiratory: Negative for cough and shortness of breath.   Cardiovascular: Negative for chest pain and leg swelling.  Gastrointestinal: Negative for abdominal pain, blood in stool, diarrhea, nausea and vomiting.  Genitourinary: Negative for dysuria.  Musculoskeletal: Negative for back pain and neck pain.  Skin:  Positive for pallor. Negative for rash.  Neurological: Negative for dizziness, syncope, light-headedness and headaches.  Hematological: Does not bruise/bleed easily.  Psychiatric/Behavioral: Negative for confusion.     Physical Exam Updated Vital Signs BP 112/75   Pulse (!) 112   Temp (!) 97.3 F (36.3 C) (Oral)   Resp 17   SpO2 (!) 88%   Physical Exam Vitals signs and nursing note reviewed.  Constitutional:      Appearance: He is well-developed. He is ill-appearing.  HENT:     Head: Normocephalic and atraumatic.     Mouth/Throat:     Mouth: Mucous membranes are dry.  Eyes:     Extraocular Movements: Extraocular movements intact.     Conjunctiva/sclera: Conjunctivae normal.     Pupils: Pupils are equal,  round, and reactive to light.  Neck:     Musculoskeletal: Normal range of motion and neck supple.  Cardiovascular:     Rate and Rhythm: Regular rhythm. Tachycardia present.     Heart sounds: No murmur.  Pulmonary:     Effort: Pulmonary effort is normal. No respiratory distress.     Breath sounds: Normal breath sounds.  Abdominal:     General: Abdomen is flat. Bowel sounds are normal.     Palpations: Abdomen is soft.     Tenderness: There is no abdominal tenderness.  Musculoskeletal: Normal range of motion.     Right lower leg: No edema.     Left lower leg: No edema.  Skin:    General: Skin is warm and dry.     Coloration: Skin is pale.  Neurological:     General: No focal deficit present.     Mental Status: He is alert and oriented to person, place, and time.      ED Treatments / Results  Labs (all labs ordered are listed, but only abnormal results are displayed) Labs Reviewed  COMPREHENSIVE METABOLIC PANEL - Abnormal; Notable for the following components:      Result Value   Glucose, Bld 150 (*)    BUN 35 (*)    Calcium 7.6 (*)    Total Protein 5.1 (*)    Albumin 2.7 (*)    Alkaline Phosphatase 35 (*)    All other components within normal limits  CBC  WITH DIFFERENTIAL/PLATELET - Abnormal; Notable for the following components:   WBC 10.7 (*)    RBC 2.02 (*)    Hemoglobin 6.5 (*)    HCT 20.1 (*)    RDW 16.4 (*)    Neutro Abs 8.3 (*)    Abs Immature Granulocytes 0.10 (*)    All other components within normal limits  LIPASE, BLOOD  TYPE AND SCREEN  PREPARE RBC (CROSSMATCH)    EKG EKG Interpretation  Date/Time:  Monday March 06 2018 11:21:00 EST Ventricular Rate:  103 PR Interval:    QRS Duration: 74 QT Interval:  350 QTC Calculation: 459 R Axis:   80 Text Interpretation:  Sinus tachycardia Confirmed by Fredia Sorrow [phone]) on 03/06/2018 12:25:44 PM   Radiology No results found.  Procedures Procedures (including critical care time)  CRITICAL CARE Performed by: Fredia Sorrow Total critical care time: 30 minutes Critical care time was exclusive of separately billable procedures and treating other patients. Critical care was necessary to treat or prevent imminent or life-threatening deterioration. Critical care was time spent personally by me on the following activities: development of treatment plan with patient and/or surrogate as well as nursing, discussions with consultants, evaluation of patient's response to treatment, examination of patient, obtaining history from patient or surrogate, ordering and performing treatments and interventions, ordering and review of laboratory studies, ordering and review of radiographic studies, pulse oximetry and re-evaluation of patient's condition.   Medications Ordered in ED Medications  0.9 %  sodium chloride infusion ( Intravenous New Bag/Given 03/06/18 1244)  0.9 %  sodium chloride infusion (has no administration in time range)  pantoprazole (PROTONIX) 80 mg in sodium chloride 0.9 % 250 mL (0.32 mg/mL) infusion (has no administration in time range)  pantoprazole (PROTONIX) injection 40 mg (has no administration in time range)  pantoprazole (PROTONIX) injection 40 mg (has no  administration in time range)  pantoprazole (PROTONIX) injection 40 mg (40 mg Intravenous Given 03/06/18 1244)  ondansetron (ZOFRAN) injection 4 mg (4 mg  Intravenous Given 03/06/18 1244)     Initial Impression / Assessment and Plan / ED Course  I have reviewed the triage vital signs and the nursing notes.  Pertinent labs & imaging results that were available during my care of the patient were reviewed by me and considered in my medical decision making (see chart for details).    Patient presenting with symptoms consistent with a recurrent upper GI bleed.  One episode of vomiting a large amount of red blood with some clots prior to arrival but none since.  Patient arrived with blood pressure systolic 89 but once after that were above 90 and most recently his systolic pressure was 768.  Patient did receive some IV fluids.  Hemoglobin down significantly at 6.5.  Patient be transfused 2 units of blood.  Patient's was slightly tachycardic and is remained slightly tachycardic.  Discussed with Dr. Melony Overly who is his GI doctor.  He is aware we will get patient readmitted.  He agrees with the Protonix drip that I ordered.  Discussed with hospitalist they will admit.  GI is contemplating rescoping.    Final Clinical Impressions(s) / ED Diagnoses   Final diagnoses:  Gastrointestinal hemorrhage, unspecified gastrointestinal hemorrhage type    ED Discharge Orders    None       Fredia Sorrow, MD 03/06/18 1445

## 2018-03-06 NOTE — ED Triage Notes (Signed)
Pick up by [facility] (truck broke down) and retrieved by [facility].  [facility] reports that pt BP by [facility] [phone] and family member reported bright red blood clots with emesis.  [facility] not sure how much hematemesis.  BP for [facility] 91/63.  Reports pt only vomiting once.  Denies any pain.  Pt is legally blind.

## 2018-03-06 NOTE — Consult Note (Signed)
Referring Provider: Orson Eva, DO Primary Care Physician:  [name], NP Primary Gastroenterologist:  Dr. Laural Golden  Reason for Consultation:   Hematemesis.  HPI:   Patient is 68 year old Caucasian male with multiple medical problems who was admitted this facility on 03/01/2015 for upper GI bleed and anemia.  He was seen in consultation by me underwent EGD on 03/01/2018.  He was noted to have small superficial ulcer in mid esophagus felt to be pill esophagitis.  He also had grade a reflux esophagitis small sliding hiatal hernia as well as erosive gastritis.  However there was no lesion that was found to be bleeding.  I felt he may have Dieulafoy lesion was not bleeding and therefore not seen. Patient's H. pylori came back positive after he was discharged.  He has not been treated yet.  And received 2 units of PRBCs during his last admission.  Discharge H&H was 9.4 and 29.0.  His hemoglobin was as low as 7.3 g on his last admission.  Patient was discharged on on pantoprazole.  Patient was advised to resume his aspirin after 1 week. Patient states he was well over the weekend.  He had good appetite.  He threw away all the OTC NSAID that he was taking.  He did not experience nausea vomiting abdominal pain or melena.  He woke up around 4:00 this morning and had 2 cups of coffee and went back to sleep.  When he woke up around 830 he began to have profuse sweating.  He did not have chest pain.  Shortly thereafter he vomited blood and coffee-ground material.  It was just what happened to him last week.  Therefore 911 was called and patient was brought to emergency room by ambulance.  On arrival he was hypertensive but he is responded to IV fluids.  He is receiving first unit of PRBCs.  He is also on pantoprazole infusion. Patient denies recent weight loss.  He has a chronic cough with production of minimal clear sputum.  He used to smoke 2 pack of cigarettes per day.  He is trying to quit.  He says he has  been smoking 1 to 2 cigarettes/day since he left the hospital.  He had dark brown stool this morning. Review of the systems is negative for heartburn dysphagia chest pain or shortness of breath.  He also denies abdominal pain.  Review of his records reveal that he is being screened for lung carcinoma.  He had chest CT in August 2019 and no abnormality was noted.  He is also up-to-date on screening for CRC.  His colonoscopy was in August 2019.   Past Medical History:  Diagnosis Date  . Chronic pain syndrome    Left knee  . COPD (chronic obstructive pulmonary disease) ([place])   . DDD (degenerative disc disease), lumbar 11/2008   L5-S1 on plain film  . Emphysema lung ([place])   . GAD (generalized anxiety disorder)   . GERD (gastroesophageal reflux disease)   . History of multiple pulmonary nodules 07/2010   Follow up CT scans showed resolution by 03/2011--NO MALIGNANCY.  Marland Kitchen History of pneumonia 06/2010  . HTN (hypertension) 01/07/2014  . Osteoarthritis of left knee    + past injury of this knee--tibia fracture?  +left leg shorter than right  . Seizure ([place])   . Seizure disorder ([place])    3 total seizures (first was 1995); neuro eval done and recommended lifetime phenytoin (he had a seizure after coming off the med in the late  90s  . Tobacco dependence     Past Surgical History:  Procedure Laterality Date  . COLONOSCOPY  2010   At [facility]; normal per pt  . COLONOSCOPY WITH PROPOFOL N/A 09/28/2017   Procedure: COLONOSCOPY WITH PROPOFOL;  Surgeon: [name], MD;  Location: [facility] ENDOSCOPY;  Service: Gastroenterology;  Laterality: N/A;  . COLONOSCOPY WITH PROPOFOL N/A 10/13/2017   Procedure: COLONOSCOPY WITH PROPOFOL;  Surgeon: Lin Landsman, MD;  Location: [facility] ENDOSCOPY;  Service: Gastroenterology;  Laterality: N/A;  . ESOPHAGOGASTRODUODENOSCOPY N/A 03/01/2018   Procedure: ESOPHAGOGASTRODUODENOSCOPY (EGD);  Surgeon: Rogene Houston, MD;  Location: [facility] ENDO SUITE;  Service: Endoscopy;   Laterality: N/A;  . KNEE SURGERY     Left : x 2    Prior to Admission medications   Medication Sig Start Date End Date Taking? Authorizing Provider  albuterol (PROVENTIL HFA;VENTOLIN HFA) 108 (90 Base) MCG/ACT inhaler Inhale 1-2 puffs into the lungs every 6 (six) hours as needed for wheezing or shortness of breath. 03/19/16  Yes Baity, Coralie Keens, NP  ALPRAZolam [name]) 0.5 MG tablet TAKE 1 TABLET(0.5 MG) BY MOUTH TWICE DAILY AS NEEDED 03/01/18  Yes Baity, Coralie Keens, NP  atorvastatin (LIPITOR) 10 MG tablet TAKE 1 TABLET EVERY DAY AT 6 PM. Patient taking differently: Take 10 mg by mouth every morning.  09/20/17  Yes Baity, Coralie Keens, NP  citalopram (CELEXA) 40 MG tablet TAKE 1 TABLET EVERY DAY Patient taking differently: Take 40 mg by mouth every morning.  09/20/17  Yes Baity, Coralie Keens, NP  losartan (COZAAR) 50 MG tablet TAKE 1 TABLET EVERY DAY Patient taking differently: Take 50 mg by mouth every morning.  09/20/17  Yes Baity, Coralie Keens, NP  nicotine (NICODERM CQ - DOSED IN MG/24 HOURS) 21 mg/24hr patch Place 21 mg onto the skin daily.   Yes [provider]  phenytoin (DILANTIN) 100 MG ER capsule TAKE 3 CAPSULES EVERY DAY Patient taking differently: Take 300 mg by mouth every morning.  09/20/17  Yes [name], NP  aspirin 81 MG tablet Take 1 tablet (81 mg total) by mouth every morning. Restart in 7 days Patient not taking: Reported on 03/06/2018 03/03/18   Kathie Dike, MD  Fluticasone-Salmeterol (ADVAIR) 500-50 MCG/DOSE AEPB Inhale 1 puff into the lungs 2 (two) times daily. Patient not taking: Reported on 02/28/2018 03/19/16   [name], NP  pantoprazole (PROTONIX) 40 MG tablet Take 1 tablet (40 mg total) by mouth 2 (two) times daily before a meal. Patient not taking: Reported on 03/06/2018 03/03/18   Kathie Dike, MD    Current Facility-Administered Medications  Medication Dose Route Frequency Provider Last Rate Last Dose  . 0.9 %  sodium chloride infusion   Intravenous Continuous  Fredia Sorrow, MD 100 mL/hr at 03/06/18 1244    . pantoprazole (PROTONIX) 80 mg in sodium chloride 0.9 % 250 mL (0.32 mg/mL) infusion  8 mg/hr Intravenous Continuous Fredia Sorrow, MD      . Derrill Memo ON 03/10/2018] pantoprazole (PROTONIX) injection 40 mg  40 mg Intravenous Q12H Fredia Sorrow, MD       Current Outpatient Medications  Medication Sig Dispense Refill  . albuterol (PROVENTIL HFA;VENTOLIN HFA) 108 (90 Base) MCG/ACT inhaler Inhale 1-2 puffs into the lungs every 6 (six) hours as needed for wheezing or shortness of breath. 54 g 1  . ALPRAZolam (XANAX) 0.5 MG tablet TAKE 1 TABLET(0.5 MG) BY MOUTH TWICE DAILY AS NEEDED 60 tablet 0  . atorvastatin (LIPITOR) 10 MG tablet TAKE 1 TABLET  EVERY DAY AT 6 PM. (Patient taking differently: Take 10 mg by mouth every morning. ) 90 tablet 2  . citalopram (CELEXA) 40 MG tablet TAKE 1 TABLET EVERY DAY (Patient taking differently: Take 40 mg by mouth every morning. ) 90 tablet 2  . losartan (COZAAR) 50 MG tablet TAKE 1 TABLET EVERY DAY (Patient taking differently: Take 50 mg by mouth every morning. ) 90 tablet 2  . nicotine (NICODERM CQ - DOSED IN MG/24 HOURS) 21 mg/24hr patch Place 21 mg onto the skin daily.    . phenytoin (DILANTIN) 100 MG ER capsule TAKE 3 CAPSULES EVERY DAY (Patient taking differently: Take 300 mg by mouth every morning. ) 270 capsule 2  . aspirin 81 MG tablet Take 1 tablet (81 mg total) by mouth every morning. Restart in 7 days (Patient not taking: Reported on 03/06/2018) 30 tablet 0  . Fluticasone-Salmeterol (ADVAIR) 500-50 MCG/DOSE AEPB Inhale 1 puff into the lungs 2 (two) times daily. (Patient not taking: Reported on 02/28/2018) 180 each 1  . pantoprazole (PROTONIX) 40 MG tablet Take 1 tablet (40 mg total) by mouth 2 (two) times daily before a meal. (Patient not taking: Reported on 03/06/2018) 60 tablet 1    Allergies as of 03/06/2018  . (No Known Allergies)    Family History  Problem Relation Age of Onset  . Heart disease  Mother   . Heart disease Father   . Cancer Sister   . Stroke Neg Hx     Social History   Socioeconomic History  . Marital status: Married    Spouse name: Not on file  . Number of children: Not on file  . Years of education: Not on file  . Highest education level: Not on file  Occupational History  . Not on file  Social Needs  . Financial resource strain: Not on file  . Food insecurity:    Worry: Not on file    Inability: Not on file  . Transportation needs:    Medical: Not on file    Non-medical: Not on file  Tobacco Use  . Smoking status: Current Every Day Smoker    Packs/day: 1.00    Years: 45.00    Pack years: 45.00    Types: Cigarettes  . Smokeless tobacco: Never Used  Substance and Sexual Activity  . Alcohol use: No    Alcohol/week: 0.0 standard drinks  . Drug use: Not Currently    Types: Marijuana    Comment: 2 month  . Sexual activity: Yes    Partners: Male  Lifestyle  . Physical activity:    Days per week: Not on file    Minutes per session: Not on file  . Stress: Not on file  Relationships  . Social connections:    Talks on phone: Not on file    Gets together: Not on file    Attends religious service: Not on file    Active member of club or organization: Not on file    Attends meetings of clubs or organizations: Not on file    Relationship status: Not on file  . Intimate partner violence:    Fear of current or ex partner: Not on file    Emotionally abused: Not on file    Physically abused: Not on file    Forced sexual activity: Not on file  Other Topics Concern  . Not on file  Social History Narrative   Married, 2 grown children   Eighth grade education.  Works as an  electrician for [employer]   Originally from [place].   Tobacco 100 pack-yr hx, still smoking as of 10/2011 (1 pack per day).   Denies alcohol or drug use.   No exercise.    Review of Systems: See HPI, otherwise normal ROS  Physical Exam: Temp:  [97.3  F (36.3 C)-98.4 F (36.9 C)] 98.3 F (36.8 C) (01/20 1448) Pulse Rate:  [95-112] 95 (01/20 1630) Resp:  [9-23] 23 (01/20 1630) BP: (89-112)/(60-75) 108/69 (01/20 1630) SpO2:  [88 %-100 %] 97 % (01/20 1448)   Patient is alert and in no acute distress. He is thin and appears pale. Conjunctival is also pale and sclerae nonicteric. Oral pharyngeal mucosa is normal.  He is edentulous and lower jaw and he has upper dentures.   No thyromegaly or lymphadenopathy noted. Cardiac exam with regular rhythm normal S1 and S2.  No murmur or gallop noted. Auscultation of lungs reveal scattered expiratory rhonchi bilaterally. Abdomen is flat.  Bowel sounds are normal.  On palpation it is soft and nontender with organomegaly or masses. No peripheral edema or clubbing noted.    Lab Results: Recent Labs    03/06/18 1235  WBC 10.7*  HGB 6.5*  HCT 20.1*  PLT 222   BMET Recent Labs    03/06/18 1235  NA 138  K 4.7  CL 109  CO2 23  GLUCOSE 150*  BUN 35*  CREATININE 0.77  CALCIUM 7.6*   LFT Recent Labs    03/06/18 1235  PROT 5.1*  ALBUMIN 2.7*  AST 15  ALT 13  ALKPHOS 35*  BILITOT 0.3    Assessment;  Patient is 68 year old Caucasian male who was evaluated for 2 unit upper GI bleed last week and found to have pill esophagitis grade a reflux esophagitis and erosive gastritis(H. pylori serology positive and he has not been treated yet).  Not bleeding from any of these lesions.  Patient had been using OTC NSAIDs frequently. He now presents in more or less similar fashion and hemoglobin of 6.3 g.  However he is not using OTC NSAIDs.  He is finishing up on first unit of PRBCs.  He is to receive second unit of PRBCs as well.  He is now hemodynamically stable. I suspect we may be dealing with Dieulafoy lesion and upper GI tract.  He could also have a tiny ulcer not seen on last EGD.  I doubt that he has hemoptysis and presenting as upper GI bleed and similarly hemobilia would be very uncommon  cause of upper GI bleeding.  Recommendations;  Esophagogastroduodenoscopy with therapeutic intention later today. Patient agreeable to proceed with the procedure. He will be given single dose of metoclopramide 10 mg IV. Further recommendations to follow.   LOS: 0 days      03/06/2018, 4:43 PM

## 2018-03-06 NOTE — ED Notes (Signed)
ED TO INPATIENT HANDOFF REPORT  Name/Age/Gender Elijah Mcfarland 68 y.o. male  Code Status Code Status History    Date Active Date Inactive Code Status Order ID Comments User Context   02/28/2018 2203 03/03/2018 1626 Full Code [id number]  Bethena Roys, MD ED      Home/SNF/Other Home  Chief Complaint Hematemesis  Level of Care/Admitting Diagnosis ED Disposition    ED Disposition Condition [place]: [facility] [[id number]]  Level of Care: Telemetry [5]  Diagnosis: Acute blood loss anemia [734287]  Admitting Physician: TAT, DAVID [[id number]]  Attending Physician: TAT, DAVID [[id number]]  PT Class (Do Not Modify): Observation [104]  PT Acc Code (Do Not Modify): Observation [10022]       Medical History Past Medical History:  Diagnosis Date  . Chronic pain syndrome    Left knee  . COPD (chronic obstructive pulmonary disease) ([place])   . DDD (degenerative disc disease), lumbar 11/2008   L5-S1 on plain film  . Emphysema lung ([place])   . GAD (generalized anxiety disorder)   . GERD (gastroesophageal reflux disease)   . History of multiple pulmonary nodules 07/2010   Follow up CT scans showed resolution by 03/2011--NO MALIGNANCY.  Marland Kitchen History of pneumonia 06/2010  . HTN (hypertension) 01/07/2014  . Osteoarthritis of left knee    + past injury of this knee--tibia fracture?  +left leg shorter than right  . Seizure ([place])   . Seizure disorder ([place])    3 total seizures (first was 1995); neuro eval done and recommended lifetime phenytoin (he had a seizure after coming off the med in the late 90s  . Tobacco dependence     Allergies No Known Allergies  IV Location/Drains/Wounds Patient Lines/Drains/Airways Status   Active Line/Drains/Airways    Name:   Placement date:   Placement time:   Site:   Days:   Peripheral IV 03/06/18 Left Forearm   03/06/18    1237    Forearm   less than 1   Peripheral IV 03/06/18 Right Forearm   03/06/18    1409    Forearm   less  than 1          Labs/Imaging Results for orders placed or performed during the hospital encounter of 03/06/18 (from the past 48 hour(s))  Comprehensive metabolic panel     Status: Abnormal   Collection Time: 03/06/18 12:35 PM  Result Value Ref Range   Sodium 138 135 - 145 mmol/L   Potassium 4.7 3.5 - 5.1 mmol/L   Chloride 109 98 - 111 mmol/L   CO2 23 22 - 32 mmol/L   Glucose, Bld 150 (H) 70 - 99 mg/dL   BUN 35 (H) 8 - 23 mg/dL   Creatinine, Ser 0.77 0.61 - 1.24 mg/dL   Calcium 7.6 (L) 8.9 - 10.3 mg/dL   Total Protein 5.1 (L) 6.5 - 8.1 g/dL   Albumin 2.7 (L) 3.5 - 5.0 g/dL   AST 15 15 - 41 U/L   ALT 13 0 - 44 U/L   Alkaline Phosphatase 35 (L) 38 - 126 U/L   Total Bilirubin 0.3 0.3 - 1.2 mg/dL   GFR calc non Af Amer >60 >60 mL/min   GFR calc Af Amer >60 >60 mL/min   Anion gap 6 5 - 15    Comment: Performed at [facility], [address] [place],  [address]  Lipase, blood     Status: None   Collection Time: 03/06/18 12:35 PM  Result Value Ref Range   Lipase 38 11 - 51 U/L    Comment: Performed at [facility], [address], [place], [place] [address]  CBC with Differential/Platelet     Status: Abnormal   Collection Time: 03/06/18 12:35 PM  Result Value Ref Range   WBC 10.7 (H) 4.0 - 10.5 K/uL   RBC 2.02 (L) 4.22 - 5.81 MIL/uL   Hemoglobin 6.5 (LL) 13.0 - 17.0 g/dL    Comment: REPEATED TO VERIFY THIS CRITICAL RESULT HAS VERIFIED AND BEEN CALLED TO H.Meckenzie Balsley BY HILLARY FLYNT ON 01 20 2020 AT 24, AND HAS BEEN READ BACK.     HCT 20.1 (L) 39.0 - 52.0 %   MCV 99.5 80.0 - 100.0 fL   MCH 32.2 26.0 - 34.0 pg   MCHC 32.3 30.0 - 36.0 g/dL   RDW 16.4 (H) 11.5 - 15.5 %   Platelets 222 150 - 400 K/uL   nRBC 0.0 0.0 - 0.2 %   Neutrophils Relative % 77 %   Neutro Abs 8.3 (H) 1.7 - 7.7 K/uL   Lymphocytes Relative 9 %   Lymphs Abs 1.0 0.7 - 4.0 K/uL   Monocytes Relative 10 %   Monocytes Absolute 1.0 0.1 - 1.0 K/uL   Eosinophils Relative 3 %   Eosinophils Absolute 0.3  0.0 - 0.5 K/uL   Basophils Relative 0 %   Basophils Absolute 0.0 0.0 - 0.1 K/uL   Immature Granulocytes 1 %   Abs Immature Granulocytes 0.10 (H) 0.00 - 0.07 K/uL    Comment: Performed at [facility], [address] [place], [place] [address]  Type and screen [facility]     Status: None (Preliminary result)   Collection Time: 03/06/18 12:35 PM  Result Value Ref Range   ABO/RH(D) O POS    Antibody Screen NEG    Sample Expiration 03/09/2018    Unit Number [id number]    Blood Component Type RED CELLS,LR    Unit division 00    Status of Unit ISSUED    Transfusion Status OK TO TRANSFUSE    Crossmatch Result      Compatible Performed at [facility], [address], [place], [place] [address]    Unit Number [id number]    Blood Component Type RED CELLS,LR    Unit division 00    Status of Unit ALLOCATED    Transfusion Status OK TO TRANSFUSE    Crossmatch Result Compatible   Prepare RBC     Status: None   Collection Time: 03/06/18  1:12 PM  Result Value Ref Range   Order Confirmation      ORDER PROCESSED BY BLOOD BANK Performed at [facility], [address] [place], [place] [address]    No results found.  Pending Labs [employer] (From admission, onward)    Start     Ordered   Signed and Occupational hygienist morning,   R     Signed and Held   Signed and Held  CBC  Tomorrow morning,   R     Signed and Held          Vitals/Pain Today's Vitals   03/06/18 1427 03/06/18 1430 03/06/18 1430 03/06/18 1436  BP: 100/63 99/64 99/64  98/60  Pulse: (!) 111  (!) 110   Resp: 18 11 13 13   Temp:   98.4 F (36.9 C)   TempSrc:   Oral   SpO2: 100%  94%   PainSc:  Isolation Precautions No active isolations  Medications Medications  0.9 %  sodium chloride infusion ( Intravenous New Bag/Given 03/06/18 1244)  pantoprazole (PROTONIX) 80 mg in sodium chloride 0.9 % 250 mL (0.32 mg/mL) infusion (has no administration in time range)   pantoprazole (PROTONIX) injection 40 mg (has no administration in time range)  pantoprazole (PROTONIX) injection 40 mg (40 mg Intravenous Given 03/06/18 1244)  ondansetron (ZOFRAN) injection 4 mg (4 mg Intravenous Given 03/06/18 1244)  0.9 %  sodium chloride infusion (10 mL/hr Intravenous New Bag/Given 03/06/18 1411)  pantoprazole (PROTONIX) injection 40 mg (40 mg Intravenous Given 03/06/18 1403)    Mobility walks

## 2018-03-06 NOTE — Progress Notes (Signed)
Brief EGD note:  Normal hypopharynx and normal laryngeal inlet. Normal mucosa of the esophagus. Small sliding hiatal hernia. Nonerosive antral gastritis but no erosions ulcer AV malformations fresh or old blood in the stomach. Normal bulbar and post bulbar mucosa.

## 2018-03-06 NOTE — ED Notes (Signed)
Date and time results received: 03/06/18 1259 (use smartphrase ".now" to insert current time)  Test:HGB Critical Value: 6.5 Name of Provider Notified: Dr Amedeo Plenty Orders Received? Or Actions Taken?: NA

## 2018-03-06 NOTE — H&P (Signed)
History and Physical  Elijah Mcfarland PHX:[id number] DOB: October 08, 1950 DOA: 03/06/2018   PCP: [name], NP   Patient coming from: Home  Chief Complaint: Hematemesis  HPI:  Elijah Mcfarland is a 68 y.o. male with medical history of COPD, hypertension, epilepsy, tobacco abuse presenting with episode of hematemesis on the morning of 03/06/2018.  Patient was recently admitted to the hospital on 02/28/2018 through 03/03/2018 for an upper GI bleed.  During that hospitalization, the patient underwent an EGD on 03/01/2018 by Dr. Laural Golden which showed superficial esophageal ulcer without bleeding, grade a esophagitis, blood and clots in the stomach, and a single nonbleeding ulceration in the gastric body.  It was thought this is due to heavy use of NSAIDs.  The patient previously drank heavily, but has not drank any alcohol since October 2015.  The patient was discharged home with Protonix once daily.  Since discharge home, the patient had denied any NSAIDs or alcohol use.  He endorses compliance with his Protonix.  He has not had any emesis until the morning of 11/05/2018.  In the emergency department, the patient had a melanotic stool without blood.  He has some dizziness but denied any chest discomfort, abdominal pain, dysuria, hematuria.  He has had some intermittent chest discomfort.  Unfortunately, he went back to smoking after discharge.  In the emergency department, the patient was afebrile with initial soft blood pressure of 89/67.  This did improve with fluid resuscitation up to 112/75.  Hemoglobin was 6.5.  His discharge hemoglobin was 9.4 on 03/03/2018.  BMP and hepatic panel unremarkable.  2 units PRBC were ordered.  GI, Dr. Laural Golden was consulted.  Assessment/Plan: Acute blood loss anemia -Due to upper GI bleed most likely -GI consulted -Clear liquid diet -Continue Protonix drip -Discontinue all NSAIDs  COPD -Patient has some mild wheezing at the bases -Start bronchodilators -Start  Pulmicort -Stable on room air  Essential hypertension -Holding losartan secondary soft blood pressure  Anxiety/depression -Restart Celexa and Xanax  Hyperlipidemia -Continue statin  Epilepsy -Continue Dilantin  Tobacco abuse -I have discussed tobacco cessation with the patient.  I have counseled the patient regarding the negative impacts of continued tobacco use including but not limited to lung cancer, COPD, and cardiovascular disease.  I have discussed alternatives to tobacco and modalities that may help facilitate tobacco cessation including but not limited to biofeedback, hypnosis, and medications.  Total time spent with tobacco counseling was 4 minutes. -NicoDerm patch         Past Medical History:  Diagnosis Date  . Chronic pain syndrome    Left knee  . COPD (chronic obstructive pulmonary disease) ([place])   . DDD (degenerative disc disease), lumbar 11/2008   L5-S1 on plain film  . Emphysema lung ([place])   . GAD (generalized anxiety disorder)   . GERD (gastroesophageal reflux disease)   . History of multiple pulmonary nodules 07/2010   Follow up CT scans showed resolution by 03/2011--NO MALIGNANCY.  Marland Kitchen History of pneumonia 06/2010  . HTN (hypertension) 01/07/2014  . Osteoarthritis of left knee    + past injury of this knee--tibia fracture?  +left leg shorter than right  . Seizure ([place])   . Seizure disorder ([place])    3 total seizures (first was 1995); neuro eval done and recommended lifetime phenytoin (he had a seizure after coming off the med in the late 90s  . Tobacco dependence    Past Surgical History:  Procedure Laterality Date  . COLONOSCOPY  2010   At [facility]; normal per pt  . COLONOSCOPY WITH PROPOFOL N/A 09/28/2017   Procedure: COLONOSCOPY WITH PROPOFOL;  Surgeon: [name], MD;  Location: [facility] ENDOSCOPY;  Service: Gastroenterology;  Laterality: N/A;  . COLONOSCOPY WITH PROPOFOL N/A 10/13/2017   Procedure: COLONOSCOPY WITH PROPOFOL;  Surgeon: Lin Landsman,  MD;  Location: [facility] ENDOSCOPY;  Service: Gastroenterology;  Laterality: N/A;  . ESOPHAGOGASTRODUODENOSCOPY N/A 03/01/2018   Procedure: ESOPHAGOGASTRODUODENOSCOPY (EGD);  Surgeon: Rogene Houston, MD;  Location: [facility] ENDO SUITE;  Service: Endoscopy;  Laterality: N/A;  . KNEE SURGERY     Left : x 2   Social History:  reports that he has been smoking cigarettes. He has a 45.00 pack-year smoking history. He has never used smokeless tobacco. He reports previous drug use. Drug: Marijuana. He reports that he does not drink alcohol.   Family History  Problem Relation Age of Onset  . Heart disease Mother   . Heart disease Father   . Cancer Sister   . Stroke Neg Hx      No Known Allergies   Prior to Admission medications   Medication Sig Start Date End Date Taking? Authorizing Provider  albuterol (PROVENTIL HFA;VENTOLIN HFA) 108 (90 Base) MCG/ACT inhaler Inhale 1-2 puffs into the lungs every 6 (six) hours as needed for wheezing or shortness of breath. 03/19/16  Yes Baity, Coralie Keens, NP  ALPRAZolam [name]) 0.5 MG tablet TAKE 1 TABLET(0.5 MG) BY MOUTH TWICE DAILY AS NEEDED 03/01/18  Yes Baity, Coralie Keens, NP  atorvastatin (LIPITOR) 10 MG tablet TAKE 1 TABLET EVERY DAY AT 6 PM. Patient taking differently: Take 10 mg by mouth every morning.  09/20/17  Yes Baity, Coralie Keens, NP  citalopram (CELEXA) 40 MG tablet TAKE 1 TABLET EVERY DAY Patient taking differently: Take 40 mg by mouth every morning.  09/20/17  Yes Baity, Coralie Keens, NP  losartan (COZAAR) 50 MG tablet TAKE 1 TABLET EVERY DAY Patient taking differently: Take 50 mg by mouth every morning.  09/20/17  Yes Baity, Coralie Keens, NP  nicotine (NICODERM CQ - DOSED IN MG/24 HOURS) 21 mg/24hr patch Place 21 mg onto the skin daily.   Yes [provider]  phenytoin (DILANTIN) 100 MG ER capsule TAKE 3 CAPSULES EVERY DAY Patient taking differently: Take 300 mg by mouth every morning.  09/20/17  Yes [name], NP  aspirin 81 MG tablet Take 1 tablet (81 mg  total) by mouth every morning. Restart in 7 days Patient not taking: Reported on 03/06/2018 03/03/18   Kathie Dike, MD  Fluticasone-Salmeterol (ADVAIR) 500-50 MCG/DOSE AEPB Inhale 1 puff into the lungs 2 (two) times daily. Patient not taking: Reported on 02/28/2018 03/19/16   [name], NP  pantoprazole (PROTONIX) 40 MG tablet Take 1 tablet (40 mg total) by mouth 2 (two) times daily before a meal. Patient not taking: Reported on 03/06/2018 03/03/18   Kathie Dike, MD    Review of Systems:  Constitutional:  No weight loss, night sweats, Fevers, chills, fatigue.  Head&Eyes: No headache.  No vision loss.  No eye pain or scotoma ENT:  No Difficulty swallowing,Tooth/dental problems,Sore throat,  No ear ache, post nasal drip,  Cardio-vascular:  No chest pain, Orthopnea, PND, swelling in lower extremities,  dizziness, palpitations  GI:  No  abdominal pain, diarrhea, loss of appetite, hematochezia,  heartburn, indigestion, Resp:   No cough. No coughing up of blood .No wheezing.No chest wall deformity  Skin:  no rash or lesions.  GU:  no  dysuria, change in color of urine, no urgency or frequency. No flank pain.  Musculoskeletal:  No joint pain or swelling. No decreased range of motion. No back pain.  Psych:  No change in mood or affect. No depression or anxiety. Neurologic: No headache, no dysesthesia, no focal weakness, no vision loss. No syncope  Physical Exam: Vitals:   03/06/18 1427 03/06/18 1430 03/06/18 1430 03/06/18 1436  BP: 100/63 99/64 99/64  98/60  Pulse: (!) 111  (!) 110   Resp: 18 11 13 13   Temp:   98.4 F (36.9 C)   TempSrc:   Oral   SpO2: 100%  94%    General:  A&O x 3, NAD, nontoxic, pleasant/cooperative Head/Eye: No conjunctival hemorrhage, no icterus, Pax/AT, No nystagmus ENT:  No icterus,  No thrush, good dentition, no pharyngeal exudate Neck:  No masses, no lymphadenpathy, no bruits CV:  RRR, no rub, no gallop, no S3 Lung: Bibasilar rales.  Minimal  bibasilar wheeze. Abdomen: soft/NT, +BS, nondistended, no peritoneal signs Ext: No cyanosis, No rashes, No petechiae, No lymphangitis, No edema Neuro: CNII-XII intact, strength 4/5 in bilateral upper and lower extremities, no dysmetria  Labs on Admission:  Basic Metabolic Panel: Recent Labs  Lab 02/28/18 1804 03/02/18 0552 03/03/18 0453 03/06/18 1235  NA 139 141 141 138  K 4.7 3.6 3.5 4.7  CL 108 113* 109 109  CO2 21* 23 24 23   GLUCOSE 226* 102* 95 150*  BUN 41* 14 7* 35*  CREATININE 0.86 0.50* 0.59* 0.77  CALCIUM 7.9* 8.1* 8.2* 7.6*  MG  --  2.1  --   --    Liver Function Tests: Recent Labs  Lab 02/28/18 1804 03/02/18 0552 03/03/18 0453 03/06/18 1235  AST 23 18 22 15   ALT 18 15 18 13   ALKPHOS 48 43 46 35*  BILITOT 0.4 0.4 0.5 0.3  PROT 5.7* 5.4* 5.5* 5.1*  ALBUMIN 3.2* 3.1* 3.2* 2.7*   Recent Labs  Lab 03/06/18 1235  LIPASE 38   No results for input(s): AMMONIA in the last 168 hours. CBC: Recent Labs  Lab 02/28/18 1804 02/28/18 2220 03/01/18 0515 03/02/18 0552 03/03/18 0453 03/06/18 1235  WBC 13.2* 8.4 7.4 9.0 6.4 10.7*  NEUTROABS 9.9*  --   --   --   --  8.3*  HGB 8.8* 7.3* 9.9* 9.7* 9.4* 6.5*  HCT 29.3* 23.3* 29.6* 30.2* 29.0* 20.1*  MCV 101.7* 100.4* 92.8 96.2 94.8 99.5  PLT 290 204 167 145* 141* 222   Coagulation Profile: Recent Labs  Lab 03/01/18 0839  INR 1.09   Cardiac Enzymes: No results for input(s): CKTOTAL, CKMB, CKMBINDEX, TROPONINI in the last 168 hours. BNP: Invalid input(s): POCBNP CBG: No results for input(s): GLUCAP in the last 168 hours. Urine analysis: No results found for: COLORURINE, APPEARANCEUR, LABSPEC, PHURINE, GLUCOSEU, HGBUR, BILIRUBINUR, KETONESUR, PROTEINUR, UROBILINOGEN, NITRITE, LEUKOCYTESUR Sepsis Labs: @LABRCNTIP (procalcitonin:4,lacticidven:4) )No results found for this or any previous visit (from the past 240 hour(s)).   Radiological Exams on Admission: No results found.  EKG: Independently reviewed.   Sinus rhythm, no ST-T wave changes    Time spent:60 minutes Code Status:   FULL Family Communication:  No Family at bedside Disposition Plan: expect 2 day hospitalization Consults called: GI-Rehman DVT Prophylaxis:  SCDs  Orson Eva, DO  [facility] Pager [phone]  If 7PM-7AM, please contact night-coverage [email or web address] Password [email or web address] 03/06/2018, 2:40 PM

## 2018-03-07 ENCOUNTER — Observation Stay (HOSPITAL_COMMUNITY): Payer: Medicare HMO

## 2018-03-07 DIAGNOSIS — I700 Atherosclerosis of aorta: Secondary | ICD-10-CM | POA: Diagnosis not present

## 2018-03-07 LAB — BASIC METABOLIC PANEL
ANION GAP: 7 (ref 5–15)
BUN: 20 mg/dL (ref 8–23)
CO2: 20 mmol/L — ABNORMAL LOW (ref 22–32)
Calcium: 7.5 mg/dL — ABNORMAL LOW (ref 8.9–10.3)
Chloride: 111 mmol/L (ref 98–111)
Creatinine, Ser: 0.68 mg/dL (ref 0.61–1.24)
GFR calc non Af Amer: >60 mL/min (ref >60)
GLUCOSE: 114 mg/dL — AB (ref 70–99)
Potassium: 3.9 mmol/L (ref 3.5–5.1)
Sodium: 138 mmol/L (ref 135–145)

## 2018-03-07 LAB — CBC
HCT: 26.8 % — ABNORMAL LOW (ref 39.0–52.0)
Hemoglobin: 8.6 g/dL — ABNORMAL LOW (ref 13.0–17.0)
MCH: 30.4 pg (ref 26.0–34.0)
MCHC: 32.1 g/dL (ref 30.0–36.0)
MCV: 94.7 fL (ref 80.0–100.0)
Platelets: 178 10*3/uL (ref 150–400)
RBC: 2.83 MIL/uL — AB (ref 4.22–5.81)
RDW: 16.1 % — ABNORMAL HIGH (ref 11.5–15.5)
WBC: 8.5 10*3/uL (ref 4.0–10.5)
nRBC: 0 % (ref 0.0–0.2)

## 2018-03-07 MED ORDER — IOPAMIDOL (ISOVUE-370) INJECTION 76%
100.0000 mL | Freq: Once | INTRAVENOUS | Status: AC | PRN
  Administered 2018-03-07: 100 mL via INTRAVENOUS

## 2018-03-07 NOTE — Progress Notes (Signed)
PROGRESS NOTE  Elijah Mcfarland VZC:[id number] DOB: 1950-03-11 DOA: 03/06/2018 PCP: [name], NP  Brief History:   68 y.o. male with medical history of COPD, hypertension, epilepsy, tobacco abuse presenting with episode of hematemesis on the morning of 03/06/2018.  Patient was recently admitted to the hospital on 02/28/2018 through 03/03/2018 for an upper GI bleed.  During that hospitalization, the patient underwent an EGD on 03/01/2018 by Dr. Laural Golden which showed superficial esophageal ulcer without bleeding, grade a esophagitis, blood and clots in the stomach, and a single nonbleeding ulceration in the gastric body.  It was thought this is due to heavy use of NSAIDs.   The patient previously drank heavily, but has not drank any alcohol since October 2015.  He states that he drinks 6 to 8 cups of coffee a day. The patient was discharged home with Protonix once daily.  Since discharge home, the patient had denied any NSAIDs or alcohol use.   He endorses compliance with his Protonix.  He has not had any emesis until the morning of 03/06/2018.  In the emergency department, the patient had a melanotic stool without blood.  He has some dizziness but denied any chest discomfort, abdominal pain, dysuria, hematuria.  He has had some intermittent chest discomfort.  Unfortunately, he went back to smoking after discharge.  In the emergency department, the patient was afebrile with initial soft blood pressure of 89/67.  This did improve with fluid resuscitation up to 112/75.  Hemoglobin was 6.5.  Assessment/Plan:  Acute blood loss anemia -upper GI bleed most likely -GI consulted -03/06/2018 EGD--no bleeding source identified, nonerosive gastritis, clear fluid in the gastric body -CTA abdomen and pelvis--no evidence of GI bleeding source -Advance to full liquid diet -Continue Protonix drip -Discontinue all NSAIDs -Transfused 2 units PRBC >>hemoglobin improved to 8.6 -A.m. CBC  COPD -Patient had  some mild wheezing at the bases initially -Started bronchodilators -Started Pulmicort -Stable on room air -Wheezing improved  Essential hypertension -Holding losartan secondary soft blood pressure  Anxiety/depression -Restart Celexa and Xanax  Hyperlipidemia -Continue statin  Epilepsy -Continue Dilantin  Tobacco abuse -I have discussed tobacco cessation with the patient.  I have counseled the patient regarding the negative impacts of continued tobacco use including but not limited to lung cancer, COPD, and cardiovascular disease.  I have discussed alternatives to tobacco and modalities that may help facilitate tobacco cessation including but not limited to biofeedback, hypnosis, and medications.  Total time spent with tobacco counseling was 4 minutes. -NicoDerm patch       Disposition Plan:   Home in 1-2 days  Family Communication:   Spouse update at bedside 1/21  Consultants:  GI--Rehman  Code Status:  FULL   DVT Prophylaxis:  SCDs   Procedures: As Listed in Progress Note Above  Antibiotics: None       Subjective: Patient denies fevers, chills, headache, chest pain, dyspnea, nausea, vomiting, diarrhea, abdominal pain, dysuria, hematuria, hematochezia, and melena.   Objective: Vitals:   03/06/18 2030 03/07/18 0002 03/07/18 0628 03/07/18 0845  BP: 110/76 113/71 116/74   Pulse: 94 88 94   Resp: 14 14    Temp: 98.3 F (36.8 C) 98.4 F (36.9 C) 98 F (36.7 C)   TempSrc: Oral Oral Oral   SpO2: 100% 100% 100% 98%  Weight:      Height:        Intake/Output Summary (Last 24 hours) at 03/07/2018 1418 Last data filed  at 03/07/2018 0641 Gross per 24 hour  Intake 2777.3 ml  Output 200 ml  Net 2577.3 ml   Weight change:  Exam:   General:  Pt is alert, follows commands appropriately, not in acute distress  HEENT: No icterus, No thrush, No neck mass, [place]/AT  Cardiovascular: RRR, S1/S2, no rubs, no gallops  Respiratory: Diminished breath sounds  bilateral.  No wheezing.  Good air movement.  Abdomen: Soft/+BS, non tender, non distended, no guarding  Extremities: No edema, No lymphangitis, No petechiae, No rashes, no synovitis   Data Reviewed: I have personally reviewed following labs and imaging studies Basic Metabolic Panel: Recent Labs  Lab 02/28/18 1804 03/02/18 0552 03/03/18 0453 03/06/18 1235 03/07/18 0623  NA 139 141 141 138 138  K 4.7 3.6 3.5 4.7 3.9  CL 108 113* 109 109 111  CO2 21* 23 24 23  20*  GLUCOSE 226* 102* 95 150* 114*  BUN 41* 14 7* 35* 20  CREATININE 0.86 0.50* 0.59* 0.77 0.68  CALCIUM 7.9* 8.1* 8.2* 7.6* 7.5*  MG  --  2.1  --   --   --    Liver Function Tests: Recent Labs  Lab 02/28/18 1804 03/02/18 0552 03/03/18 0453 03/06/18 1235  AST 23 18 22 15   ALT 18 15 18 13   ALKPHOS 48 43 46 35*  BILITOT 0.4 0.4 0.5 0.3  PROT 5.7* 5.4* 5.5* 5.1*  ALBUMIN 3.2* 3.1* 3.2* 2.7*   Recent Labs  Lab 03/06/18 1235  LIPASE 38   No results for input(s): AMMONIA in the last 168 hours. Coagulation Profile: Recent Labs  Lab 03/01/18 0839  INR 1.09   CBC: Recent Labs  Lab 02/28/18 1804  03/01/18 0515 03/02/18 0552 03/03/18 0453 03/06/18 1235 03/07/18 0623  WBC 13.2*   < > 7.4 9.0 6.4 10.7* 8.5  NEUTROABS 9.9*  --   --   --   --  8.3*  --   HGB 8.8*   < > 9.9* 9.7* 9.4* 6.5* 8.6*  HCT 29.3*   < > 29.6* 30.2* 29.0* 20.1* 26.8*  MCV 101.7*   < > 92.8 96.2 94.8 99.5 94.7  PLT 290   < > 167 145* 141* 222 178   < > = values in this interval not displayed.   Cardiac Enzymes: No results for input(s): CKTOTAL, CKMB, CKMBINDEX, TROPONINI in the last 168 hours. BNP: Invalid input(s): POCBNP CBG: No results for input(s): GLUCAP in the last 168 hours. HbA1C: No results for input(s): HGBA1C in the last 72 hours. Urine analysis: No results found for: COLORURINE, APPEARANCEUR, LABSPEC, PHURINE, GLUCOSEU, HGBUR, BILIRUBINUR, KETONESUR, PROTEINUR, UROBILINOGEN, NITRITE, LEUKOCYTESUR Sepsis  Labs: @LABRCNTIP (procalcitonin:4,lacticidven:4) )No results found for this or any previous visit (from the past 240 hour(s)).   Scheduled Meds: . atorvastatin  10 mg Oral q1800  . budesonide (PULMICORT) nebulizer solution  0.5 mg Nebulization BID  . citalopram  40 mg Oral q morning - 10a  . ipratropium-albuterol  3 mL Nebulization BID  . mouth rinse  15 mL Mouth Rinse BID  . nicotine  21 mg Transdermal Daily  . [START ON 03/10/2018] pantoprazole  40 mg Intravenous Q12H  . phenytoin  300 mg Oral q morning - 10a   Continuous Infusions: . sodium chloride Stopped (03/07/18 0453)  . pantoprozole (PROTONIX) infusion 8 mg/hr (03/07/18 0641)    Procedures/Studies: Ct Angio Abd/pel W/ And/or W/o  Result Date: 03/07/2018 CLINICAL DATA:  GI bleeding with increasing hematemesis EXAM: CTA ABDOMEN AND PELVIS wITHOUT AND WITH CONTRAST  TECHNIQUE: Multidetector CT imaging of the abdomen and pelvis was performed using the standard protocol during bolus administration of intravenous contrast. Multiplanar reconstructed images and MIPs were obtained and reviewed to evaluate the vascular anatomy. CONTRAST:  161mL ISOVUE-370 IOPAMIDOL (ISOVUE-370) INJECTION 76% COMPARISON:  None. FINDINGS: VASCULAR Aorta: Moderate atheromatous wall thickening and intermittent calcification. No aneurysm or dissection. Celiac: Diffusely patent branches. Negative for pseudoaneurysm, beading, or significant atheromatous changes. SMA: Widely patent vessels.  No notable atheromatous changes. Renals: Single bilateral renal arteries. Atherosclerosis especially proximally on the left without flow limiting stenosis, aneurysm, or beading. IMA: Patent with atheromatous narrowing at the ostium. Inflow: Atherosclerosis without flow limiting stenosis, dissection, or aneurysm. Proximal Outflow: Atherosclerosis without significant stenosis. Veins: Major venous structures are diffusely patent Review of the MIP images confirms the above findings.  NON-VASCULAR Lower chest:  Emphysema and airway thickening Hepatobiliary: 7 mm hypervascular focus in the left lateral liver that is blood pool on delayed phase, consistent with hemangioma or small shunt.No evidence of biliary obstruction or stone. Pancreas: Unremarkable. Spleen: Unremarkable. Adrenals/Urinary Tract: Negative adrenals. No hydronephrosis or stone. Tiny presumed cyst in the left renal cortex. Unremarkable bladder. Stomach/Bowel: No evidence of bowel inflammation. No gross ulcer or mass. Moderate stool volume. Lymphatic: No acute vascular abnormality.  No mass or adenopathy. Reproductive:Enlarged central prostate projecting into the bladder base. The same appearance was seen on a 2012 PET-CT. Other: No ascites or pneumoperitoneum. Musculoskeletal: No acute abnormalities. Spinal degeneration including advanced L5-S1 disc narrowing. IMPRESSION: 1. No emergent finding.  No evident source of GI bleeding. 2. Atherosclerosis. Electronically Signed   By: Monte Fantasia M.D.   On: 03/07/2018 05:49    Orson Eva, DO  [facility] Pager [phone]  If 7PM-7AM, please contact night-coverage [email or web address] Password [email or web address] 03/07/2018, 2:18 PM   LOS: 0 days

## 2018-03-07 NOTE — Progress Notes (Signed)
  Subjective:  Patient has no complaints.  He denies nausea vomiting or hematemesis.  He also denies abdominal pain chest pain or shortness of breath.  He had bowel movement this morning.  He was formed but black stool.  He is hungry this morning.  Objective: Blood pressure 116/74, pulse 94, temperature 98 F (36.7 C), temperature source Oral, resp. rate 14, height _0  (1.727 m), weight 57.9 kg, SpO2 100 %. Patient is sitting at edge of bed and drinking coffee. He is in no acute distress. Cardiac exam with regular rhythm normal S1 and S2.  No murmur gallop noted. Auscultation of lungs reveal scattered expiratory rhonchi bilaterally. Abdomen is flat.  No bruit noted.  On palpation is soft and nontender. Extremities thin without edema or clubbing.  Labs/studies Results:  CBC Latest Ref Rng & Units 03/07/2018 03/06/2018 03/03/2018  WBC 4.0 - 10.5 K/uL 8.5 10.7(H) 6.4  Hemoglobin 13.0 - 17.0 g/dL 8.6(L) 6.5(LL) 9.4(L)  Hematocrit 39.0 - 52.0 % 26.8(L) 20.1(L) 29.0(L)  Platelets 150 - 400 K/uL 178 222 141(L)    CMP Latest Ref Rng & Units 03/07/2018 03/06/2018 03/03/2018  Glucose 70 - 99 mg/dL 114(H) 150(H) 95  BUN 8 - 23 mg/dL 20 35(H) 7(L)  Creatinine 0.61 - 1.24 mg/dL 0.68 0.77 0.59(L)  Sodium 135 - 145 mmol/L 138 138 141  Potassium 3.5 - 5.1 mmol/L 3.9 4.7 3.5  Chloride 98 - 111 mmol/L 111 109 109  CO2 22 - 32 mmol/L 20(L) 23 24  Calcium 8.9 - 10.3 mg/dL 7.5(L) 7.6(L) 8.2(L)  Total Protein 6.5 - 8.1 g/dL - 5.1(L) 5.5(L)  Total Bilirubin 0.3 - 1.2 mg/dL - 0.3 0.5  Alkaline Phos 38 - 126 U/L - 35(L) 46  AST 15 - 41 U/L - 15 22  ALT 0 - 44 U/L - 13 18    Hepatic Function Latest Ref Rng & Units 03/06/2018 03/03/2018 03/02/2018  Total Protein 6.5 - 8.1 g/dL 5.1(L) 5.5(L) 5.4(L)  Albumin 3.5 - 5.0 g/dL 2.7(L) 3.2(L) 3.1(L)  AST 15 - 41 U/L _1 ALT 0 - 44 U/L _2 Alk Phosphatase 38 - 126 U/L 35(L) 46 43  Total Bilirubin 0.3 - 1.2 mg/dL 0.3 0.5 0.4     CTA abdomen and pelvis  reviewed.  7 mm hypervascular focus in left lobe consistent with hemangioma.  No abnormal vasculature noted.  Atherosclerosis.  Assessment:  #1.  Hematemesis from an unknown source.  Patient underwent EGD yesterday and 1 last week and no bleeding lesion identified.  CT angios abdomen and pelvis does not reveal any vascular abnormality or hepatobiliary lesions.  Thorough evaluation of nasopharynx needs to be done.  If he rebleeds will consider urgent endoscopy.  #2.  Anemia secondary to GI bleed.  Patient has received 2 units of PRBCs.  #3. H. Pylori gastritis.  He will be treated on an outpatient basis  #4.  COPD.  Patient has no acute symptoms.   Recommendations:  ENT consultation if possible. Advance diet to full liquid diet. Consider urgent endoscopy if evidence of rebleed. Will discuss with pulmonologist as well.

## 2018-03-07 NOTE — Care Management Obs Status (Signed)
[place] NOTIFICATION   Patient Details  Name: Fabrizzio Marcella MRN: [id number] Date of Birth: 1950-08-23   Medicare Observation Status Notification Given:  Yes    Collin Rengel, Chauncey [place], RN 03/07/2018, 12:44 PM

## 2018-03-07 NOTE — Progress Notes (Addendum)
Patient has had uneventful day today. He has not had any more bowel movement. He denies feeling dizzy or lightheaded when he walks to the bathroom. Inhouse ENT consultation not available. We will proceed with small bowel given capsule study tomorrow. Will leave him on pantoprazole infusion until tomorrow morning.

## 2018-03-08 ENCOUNTER — Encounter (HOSPITAL_COMMUNITY)
Admission: EM | Disposition: A | Discharge status: Home / Self Care | Payer: Self-pay | Source: Home / Self Care | Attending: Family Medicine

## 2018-03-08 ENCOUNTER — Encounter (HOSPITAL_COMMUNITY): Payer: Self-pay | Admitting: Internal Medicine

## 2018-03-08 DIAGNOSIS — K920 Hematemesis: Secondary | ICD-10-CM | POA: Diagnosis not present

## 2018-03-08 DIAGNOSIS — D696 Thrombocytopenia, unspecified: Secondary | ICD-10-CM | POA: Diagnosis present

## 2018-03-08 DIAGNOSIS — J449 Chronic obstructive pulmonary disease, unspecified: Secondary | ICD-10-CM | POA: Diagnosis present

## 2018-03-08 DIAGNOSIS — E782 Mixed hyperlipidemia: Secondary | ICD-10-CM | POA: Diagnosis present

## 2018-03-08 DIAGNOSIS — Z79899 Other long term (current) drug therapy: Secondary | ICD-10-CM | POA: Diagnosis not present

## 2018-03-08 DIAGNOSIS — B9681 Helicobacter pylori [H. pylori] as the cause of diseases classified elsewhere: Secondary | ICD-10-CM | POA: Diagnosis present

## 2018-03-08 DIAGNOSIS — K228 Other specified diseases of esophagus: Secondary | ICD-10-CM | POA: Diagnosis not present

## 2018-03-08 DIAGNOSIS — F1290 Cannabis use, unspecified, uncomplicated: Secondary | ICD-10-CM | POA: Diagnosis present

## 2018-03-08 DIAGNOSIS — G894 Chronic pain syndrome: Secondary | ICD-10-CM | POA: Diagnosis present

## 2018-03-08 DIAGNOSIS — K219 Gastro-esophageal reflux disease without esophagitis: Secondary | ICD-10-CM | POA: Diagnosis present

## 2018-03-08 DIAGNOSIS — G40909 Epilepsy, unspecified, not intractable, without status epilepticus: Secondary | ICD-10-CM | POA: Diagnosis present

## 2018-03-08 DIAGNOSIS — F329 Major depressive disorder, single episode, unspecified: Secondary | ICD-10-CM | POA: Diagnosis present

## 2018-03-08 DIAGNOSIS — Z8249 Family history of ischemic heart disease and other diseases of the circulatory system: Secondary | ICD-10-CM | POA: Diagnosis not present

## 2018-03-08 DIAGNOSIS — F17200 Nicotine dependence, unspecified, uncomplicated: Secondary | ICD-10-CM | POA: Diagnosis not present

## 2018-03-08 DIAGNOSIS — K922 Gastrointestinal hemorrhage, unspecified: Secondary | ICD-10-CM | POA: Diagnosis present

## 2018-03-08 DIAGNOSIS — Z7951 Long term (current) use of inhaled steroids: Secondary | ICD-10-CM | POA: Diagnosis not present

## 2018-03-08 DIAGNOSIS — Z716 Tobacco abuse counseling: Secondary | ICD-10-CM | POA: Diagnosis not present

## 2018-03-08 DIAGNOSIS — F411 Generalized anxiety disorder: Secondary | ICD-10-CM | POA: Diagnosis present

## 2018-03-08 DIAGNOSIS — D62 Acute posthemorrhagic anemia: Secondary | ICD-10-CM | POA: Diagnosis present

## 2018-03-08 DIAGNOSIS — F17210 Nicotine dependence, cigarettes, uncomplicated: Secondary | ICD-10-CM | POA: Diagnosis present

## 2018-03-08 DIAGNOSIS — Z7982 Long term (current) use of aspirin: Secondary | ICD-10-CM | POA: Diagnosis not present

## 2018-03-08 DIAGNOSIS — I10 Essential (primary) hypertension: Secondary | ICD-10-CM | POA: Diagnosis present

## 2018-03-08 DIAGNOSIS — R918 Other nonspecific abnormal finding of lung field: Secondary | ICD-10-CM | POA: Diagnosis present

## 2018-03-08 DIAGNOSIS — K449 Diaphragmatic hernia without obstruction or gangrene: Secondary | ICD-10-CM | POA: Diagnosis present

## 2018-03-08 DIAGNOSIS — H3530 Unspecified macular degeneration: Secondary | ICD-10-CM | POA: Diagnosis present

## 2018-03-08 DIAGNOSIS — K3189 Other diseases of stomach and duodenum: Secondary | ICD-10-CM

## 2018-03-08 DIAGNOSIS — K2960 Other gastritis without bleeding: Secondary | ICD-10-CM | POA: Diagnosis present

## 2018-03-08 HISTORY — PX: GIVENS CAPSULE STUDY: SHX5432

## 2018-03-08 LAB — CBC
HCT: 24.9 % — ABNORMAL LOW (ref 39.0–52.0)
HEMOGLOBIN: 8.1 g/dL — AB (ref 13.0–17.0)
MCH: 31.2 pg (ref 26.0–34.0)
MCHC: 32.5 g/dL (ref 30.0–36.0)
MCV: 95.8 fL (ref 80.0–100.0)
Platelets: 73 10*3/uL — ABNORMAL LOW (ref 150–400)
RBC: 2.6 MIL/uL — ABNORMAL LOW (ref 4.22–5.81)
RDW: 15.9 % — ABNORMAL HIGH (ref 11.5–15.5)
WBC: 5.7 10*3/uL (ref 4.0–10.5)
nRBC: 0 % (ref 0.0–0.2)

## 2018-03-08 LAB — HEMOGLOBIN AND HEMATOCRIT, BLOOD
HCT: 29.9 % — ABNORMAL LOW (ref 39.0–52.0)
Hemoglobin: 9.8 g/dL — ABNORMAL LOW (ref 13.0–17.0)

## 2018-03-08 LAB — PREPARE RBC (CROSSMATCH)

## 2018-03-08 SURGERY — IMAGING PROCEDURE, GI TRACT, INTRALUMINAL, VIA CAPSULE

## 2018-03-08 MED ORDER — SODIUM CHLORIDE 0.9% IV SOLUTION
Freq: Once | INTRAVENOUS | Status: AC
  Administered 2018-03-08: 13:00:00 via INTRAVENOUS

## 2018-03-08 MED ORDER — HYDRALAZINE HCL 20 MG/ML IJ SOLN: 10.0000 mg | Freq: Four times a day (QID) | INTRAMUSCULAR | Status: DC | PRN

## 2018-03-08 MED ORDER — PANTOPRAZOLE SODIUM 40 MG PO TBEC
40.0000 mg | DELAYED_RELEASE_TABLET | Freq: Two times a day (BID) | ORAL | Status: DC
  Administered 2018-03-08 – 2018-03-10 (×4): 40 mg via ORAL
  Filled 2018-03-08 (×4): qty 1

## 2018-03-08 MED ORDER — PANTOPRAZOLE SODIUM 40 MG PO TBEC
40.0000 mg | DELAYED_RELEASE_TABLET | Freq: Every day | ORAL | Status: DC
  Administered 2018-03-08: 40 mg via ORAL
  Filled 2018-03-08: qty 1

## 2018-03-08 MED ORDER — DIPHENHYDRAMINE HCL 25 MG PO CAPS
25.0000 mg | ORAL_CAPSULE | Freq: Once | ORAL | Status: AC
  Administered 2018-03-08: 25 mg via ORAL
  Filled 2018-03-08: qty 1

## 2018-03-08 MED ORDER — ACETAMINOPHEN 325 MG PO TABS
650.0000 mg | ORAL_TABLET | Freq: Once | ORAL | Status: AC
  Administered 2018-03-08: 650 mg via ORAL
  Filled 2018-03-08: qty 2

## 2018-03-08 NOTE — Op Note (Signed)
Small Bowel [place] Capsule Study Procedure date: March 08, 2018.  Referring Provider: Orson Eva, DO PCP:  Dr. Garnette Gunner, Coralie Keens, NP  Indication for procedure:   Patient is 68 year old Caucasian male who presents with second episode of hematemesis.  He received 2 units on his first admission last week ago and underwent EGD and no bleeding lesion was then defied.  It was felt his upper GI bleed was most likely due to frequent NSAID use.  He now returns with a second episode and is received 2 units of PRBCs.  EGD 2 days ago did not reveal bleeding lesion.  CT angios abdomen and pelvis was unremarkable.  He is therefore undergoing small bowel given capsule study hoping to find the source of his GI bleed.   Findings: Patient was able to swallow given capsule without any difficulty. Single small to moderate sized clot noted covering antral mucosa.  No active bleeding noted around it. Given capsule reached duodenal bulb in 14 minutes and 34 seconds and then refluxed back into the stomach and eventually passed into duodenum and distally. Normal small bowel mucosa. Coffee-ground material noted at cecum.  First Gastric image: 42 sec First Duodenal image: 14 min and 34 sec First Ileo-Cecal Valve image: 4 hrs 44 min and 39 sec First Cecal image: 4 hrs 46 min and 23 sec Gastric Passage time: 13 min and 52 sec Small Bowel Passage time: 4 hrs 31 min and 49 sec  Summary & Recommendations:  Single small to moderate sized clot noted overlying gastric antral mucosa.  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 Normal mucosa of small bowel. Coffee-ground material noted at cecum. Patient will undergo esophagogastroduodenoscopy in a.m.

## 2018-03-08 NOTE — Progress Notes (Signed)
Patient Demographics:    Elijah Mcfarland, is a 68 y.o. male, DOB - [birth year], BDZ:[id number]  Admit date - 03/06/2018   Admitting Physician Orson Eva, MD  Outpatient Primary MD for the patient is Baity, Coralie Keens, NP  LOS - 0   Chief Complaint  Patient presents with  . Hematemesis        Subjective:    Elijah Mcfarland today has no fevers, no emesis,  No chest pain, patient wants to eat, no further BM  Assessment  & Plan :    Active Problems:   HYPERLIPIDEMIA, MIXED   TOBACCO ABUSE   Upper GI bleed   Acute blood loss anemia  Brief summary 68 year old with past medical history relevant for COPD, HTN, tobacco abuse admitted on 03/06/2018 with hematemesis, underwent EGD on 03/07/2018 without source of bleeding, for capsule endoscopy on 03/08/2018  PLan:- 1) acute GI bleed--- hemoglobin is down to 8.1 from 9.9 despite transfusion of 2 units of PRBCs,, suspect some degree of hemodilution... Need to rule out further GI bleed at this time, no further hematemesis, no BM, PPI, per GI service okay to advance diet , being transfused additional units of packed cells as of 03/08/2018 per GI service recommendations  2)COPD--- no significant exacerbation at this time, continue bronchodilators, Pulmicort, smoking cessation advised  3)HTN--- stable,  may use IV Hydralazine 10 mg  Every 4 hours Prn for systolic blood pressure over 160 mmhg  4)Anxiety/Depression--- stable, continue Celexa 40 mg daily and Xanax 0.5 mg twice daily as needed  5)H/o SZ--- stable, continue Dilantin 300 mg qam  6) thrombocytopenia--- suspect Thrombocytopenia of consumption due to GI bleed  (down from 222  to 73)  7) acute blood loss anemia--- secondary to #1 above, being transfused additional units of packed cells as of 03/08/2018 per GI service recommendations  Disposition/Need for in-Hospital Stay- patient unable to be discharged at this  time due to GI bleed--- awaiting capsule endoscopy results, being transfused additional units of packed cells as of 03/08/2018 per GI service recommendations,  may need further transfusion if H&H continues to drop, possible discharge in a.m. if H&H is stable and tolerating oral intake  Code Status : Full  Family Communication:   na   Disposition Plan  : possible discharge in a.m. if H&H is stable and tolerating oral intake  Consults  :  Gi  DVT Prophylaxis  :  Lovenox - Heparin - SCDs  (GI bleed)  Lab Results  Component Value Date   PLT 73 (L) 03/08/2018    Inpatient Medications  Scheduled Meds: . atorvastatin  10 mg Oral q1800  . budesonide (PULMICORT) nebulizer solution  0.5 mg Nebulization BID  . citalopram  40 mg Oral q morning - 10a  . ipratropium-albuterol  3 mL Nebulization BID  . mouth rinse  15 mL Mouth Rinse BID  . nicotine  21 mg Transdermal Daily  . pantoprazole  40 mg Oral BID  . phenytoin  300 mg Oral q morning - 10a   Continuous Infusions: . sodium chloride 50 mL/hr at 03/08/18 1047   PRN Meds:.acetaminophen **OR** acetaminophen, ALPRAZolam, ondansetron **OR** ondansetron (ZOFRAN) IV    Anti-infectives (From admission, onward)   None        Objective:  Vitals:   03/08/18 0731 03/08/18 1207 03/08/18 1230 03/08/18 1420  BP:  113/74 107/75 115/75  Pulse:  87 87 86  Resp:  18 18 18   Temp:  98.2 F (36.8 C) 98.4 F (36.9 C) 98.6 F (37 C)  TempSrc:  Oral Oral Oral  SpO2: 98% 100% 100% 100%  Weight:      Height:        Wt Readings from Last 3 Encounters:  03/06/18 57.9 kg  03/01/18 59.2 kg  10/13/17 54.4 kg     Intake/Output Summary (Last 24 hours) at 03/08/2018 1719 Last data filed at 03/08/2018 1420 Gross per 24 hour  Intake 380 ml  Output -  Net 380 ml     Physical Exam Patient is examined daily including today on 03/08/18 , exams remain the same as of yesterday except that has changed   Gen:- Awake Alert,  In no apparent distress   HEENT:- [place].AT, No sclera icterus Neck-Supple Neck,No JVD,.  Lungs-  CTAB , fair symmetrical air movement CV- S1, S2 normal, regular  Abd-  +ve B.Sounds, Abd Soft, mild epigastric discomfort without rebound or guarding  extremity/Skin:- No  edema, pedal pulses present  Psych-affect is appropriate, oriented x3 Neuro-no new focal deficits, no tremors   Data Review:   Micro Results No results found for this or any previous visit (from the past 240 hour(s)).  Radiology Reports Ct Angio Abd/pel W/ And/or W/o  Result Date: 03/07/2018 CLINICAL DATA:  GI bleeding with increasing hematemesis EXAM: CTA ABDOMEN AND PELVIS wITHOUT AND WITH CONTRAST TECHNIQUE: Multidetector CT imaging of the abdomen and pelvis was performed using the standard protocol during bolus administration of intravenous contrast. Multiplanar reconstructed images and MIPs were obtained and reviewed to evaluate the vascular anatomy. CONTRAST:  12mL ISOVUE-370 IOPAMIDOL (ISOVUE-370) INJECTION 76% COMPARISON:  None. FINDINGS: VASCULAR Aorta: Moderate atheromatous wall thickening and intermittent calcification. No aneurysm or dissection. Celiac: Diffusely patent branches. Negative for pseudoaneurysm, beading, or significant atheromatous changes. SMA: Widely patent vessels.  No notable atheromatous changes. Renals: Single bilateral renal arteries. Atherosclerosis especially proximally on the left without flow limiting stenosis, aneurysm, or beading. IMA: Patent with atheromatous narrowing at the ostium. Inflow: Atherosclerosis without flow limiting stenosis, dissection, or aneurysm. Proximal Outflow: Atherosclerosis without significant stenosis. Veins: Major venous structures are diffusely patent Review of the MIP images confirms the above findings. NON-VASCULAR Lower chest:  Emphysema and airway thickening Hepatobiliary: 7 mm hypervascular focus in the left lateral liver that is blood pool on delayed phase, consistent with hemangioma or  small shunt.No evidence of biliary obstruction or stone. Pancreas: Unremarkable. Spleen: Unremarkable. Adrenals/Urinary Tract: Negative adrenals. No hydronephrosis or stone. Tiny presumed cyst in the left renal cortex. Unremarkable bladder. Stomach/Bowel: No evidence of bowel inflammation. No gross ulcer or mass. Moderate stool volume. Lymphatic: No acute vascular abnormality.  No mass or adenopathy. Reproductive:Enlarged central prostate projecting into the bladder base. The same appearance was seen on a 2012 PET-CT. Other: No ascites or pneumoperitoneum. Musculoskeletal: No acute abnormalities. Spinal degeneration including advanced L5-S1 disc narrowing. IMPRESSION: 1. No emergent finding.  No evident source of GI bleeding. 2. Atherosclerosis. Electronically Signed   By: Monte Fantasia M.D.   On: 03/07/2018 05:49     CBC Recent Labs  Lab 03/02/18 0552 03/03/18 0453 03/06/18 1235 03/07/18 0623 03/08/18 0408  WBC 9.0 6.4 10.7* 8.5 5.7  HGB 9.7* 9.4* 6.5* 8.6* 8.1*  HCT 30.2* 29.0* 20.1* 26.8* 24.9*  PLT 145* 141* 222 178 73*  MCV  96.2 94.8 99.5 94.7 95.8  MCH 30.9 30.7 32.2 30.4 31.2  MCHC 32.1 32.4 32.3 32.1 32.5  RDW 16.1* 15.7* 16.4* 16.1* 15.9*  LYMPHSABS  --   --  1.0  --   --   MONOABS  --   --  1.0  --   --   EOSABS  --   --  0.3  --   --   BASOSABS  --   --  0.0  --   --     Chemistries  Recent Labs  Lab 03/02/18 0552 03/03/18 0453 03/06/18 1235 03/07/18 0623  NA 141 141 138 138  K 3.6 3.5 4.7 3.9  CL 113* 109 109 111  CO2 23 24 23  20*  GLUCOSE 102* 95 150* 114*  BUN 14 7* 35* 20  CREATININE 0.50* 0.59* 0.77 0.68  CALCIUM 8.1* 8.2* 7.6* 7.5*  MG 2.1  --   --   --   AST 18 22 15   --   ALT 15 18 13   --   ALKPHOS 43 46 35*  --   BILITOT 0.4 0.5 0.3  --    ------------------------------------------------------------------------------------------------------------------ No results for input(s): CHOL, HDL, LDLCALC, TRIG, CHOLHDL, LDLDIRECT in the last 72  hours.  Lab Results  Component Value Date   HGBA1C 5.8 08/22/2017   ------------------------------------------------------------------------------------------------------------------   Roxan Hockey M.D on 03/08/2018 at 5:19 PM  Go to [email or web address] - for contact info  [facility] - Office  [phone]

## 2018-03-08 NOTE — Progress Notes (Signed)
  Subjective:  Patient has no complaints.  He denies hemoptysis heartburn dysphagia nausea or vomiting.  He also denies abdominal pain.  He has not had a bowel movement since yesterday morning.  He is hungry.  He had no difficulty swallowing given capsule this morning.   Objective: Blood pressure 100/63, pulse 92, temperature 98.2 F (36.8 C), temperature source Oral, resp. rate 16, height '5\' 8"'$  (1.727 m), weight 57.9 kg, SpO2 98 %. Patient is alert and in no acute distress. He appears pale.  Labs/studies Results:  CBC Latest Ref Rng & Units 03/08/2018 03/07/2018 03/06/2018  WBC 4.0 - 10.5 K/uL 5.7 8.5 10.7(H)  Hemoglobin 13.0 - 17.0 g/dL 8.1(L) 8.6(L) 6.5(LL)  Hematocrit 39.0 - 52.0 % 24.9(L) 26.8(L) 20.1(L)  Platelets 150 - 400 K/uL 73(L) 178 222    CMP Latest Ref Rng & Units 03/07/2018 03/06/2018 03/03/2018  Glucose 70 - 99 mg/dL 114(H) 150(H) 95  BUN 8 - 23 mg/dL 20 35(H) 7(L)  Creatinine 0.61 - 1.24 mg/dL 0.68 0.77 0.59(L)  Sodium 135 - 145 mmol/L 138 138 141  Potassium 3.5 - 5.1 mmol/L 3.9 4.7 3.5  Chloride 98 - 111 mmol/L 111 109 109  CO2 22 - 32 mmol/L 20(L) 23 24  Calcium 8.9 - 10.3 mg/dL 7.5(L) 7.6(L) 8.2(L)  Total Protein 6.5 - 8.1 g/dL - 5.1(L) 5.5(L)  Total Bilirubin 0.3 - 1.2 mg/dL - 0.3 0.5  Alkaline Phos 38 - 126 U/L - 35(L) 46  AST 15 - 41 U/L - 15 22  ALT 0 - 44 U/L - 13 18    Hepatic Function Latest Ref Rng & Units 03/06/2018 03/03/2018 03/02/2018  Total Protein 6.5 - 8.1 g/dL 5.1(L) 5.5(L) 5.4(L)  Albumin 3.5 - 5.0 g/dL 2.7(L) 3.2(L) 3.1(L)  AST 15 - 41 U/L '15 22 18  '$ ALT 0 - 44 U/L '13 18 15  '$ Alk Phosphatase 38 - 126 U/L 35(L) 46 43  Total Bilirubin 0.3 - 1.2 mg/dL 0.3 0.5 0.4     Assessment:  #1.  Hematemesis.  2 episodes 1 week apart and no bleeding lesion found on 2 EGDs.  CT angios abdomen and pelvis did not reveal any vascular or venous abnormalities.  He is undergoing small bowel given capsule study today.  He may have Dieulafoy lesion not seen on these  EGDs since he was not bleeding.  No possibility that source of bleeding could be nasopharynx.  #2.  Anemia secondary to GI bleed.  Hemoglobin is down to 8.1 g.  Drop is due to hemodilution.  I do not believe he is actively bleeding at present time.  #3.  Thrombocytopenia.  Platelet count on admission was normal.  Drop in platelet count most likely to GI bleed.  CT was negative for splenomegaly or changes of cirrhosis.   Recommendations:  Transfuse 1 unit of PRBCs. Posttransfusion H&H. Change pantoprazole to oral route. Will review small bowel given capsule study later this afternoon. Repeat CBC in a.m.

## 2018-03-09 ENCOUNTER — Encounter (HOSPITAL_COMMUNITY)
Admission: EM | Disposition: A | Discharge status: Home / Self Care | Payer: Self-pay | Source: Home / Self Care | Attending: Family Medicine

## 2018-03-09 ENCOUNTER — Encounter (HOSPITAL_COMMUNITY): Payer: Self-pay | Admitting: Internal Medicine

## 2018-03-09 HISTORY — PX: ESOPHAGOGASTRODUODENOSCOPY: SHX5428

## 2018-03-09 LAB — CBC
HCT: 27.7 % — ABNORMAL LOW (ref 39.0–52.0)
Hemoglobin: 9 g/dL — ABNORMAL LOW (ref 13.0–17.0)
MCH: 30.8 pg (ref 26.0–34.0)
MCHC: 32.5 g/dL (ref 30.0–36.0)
MCV: 94.9 fL (ref 80.0–100.0)
NRBC: 0 % (ref 0.0–0.2)
Platelets: 186 10*3/uL (ref 150–400)
RBC: 2.92 MIL/uL — AB (ref 4.22–5.81)
RDW: 15.2 % (ref 11.5–15.5)
WBC: 6.5 10*3/uL (ref 4.0–10.5)

## 2018-03-09 SURGERY — EGD (ESOPHAGOGASTRODUODENOSCOPY)
Anesthesia: Moderate Sedation

## 2018-03-09 MED ORDER — STERILE WATER FOR IRRIGATION IR SOLN
Status: DC | PRN
  Administered 2018-03-09: 2.5 mL

## 2018-03-09 MED ORDER — LIDOCAINE VISCOUS HCL 2 % MT SOLN
OROMUCOSAL | Status: AC
  Filled 2018-03-09: qty 15

## 2018-03-09 MED ORDER — MEPERIDINE HCL 50 MG/ML IJ SOLN
INTRAMUSCULAR | Status: DC | PRN
  Administered 2018-03-09 (×2): 25 mg via INTRAVENOUS

## 2018-03-09 MED ORDER — MIDAZOLAM HCL 5 MG/5ML IJ SOLN
INTRAMUSCULAR | Status: AC
  Filled 2018-03-09: qty 10

## 2018-03-09 MED ORDER — LIDOCAINE VISCOUS HCL 2 % MT SOLN
OROMUCOSAL | Status: DC | PRN
  Administered 2018-03-09: 5 mL via OROMUCOSAL

## 2018-03-09 MED ORDER — MEPERIDINE HCL 50 MG/ML IJ SOLN
INTRAMUSCULAR | Status: AC
  Filled 2018-03-09: qty 1

## 2018-03-09 MED ORDER — SODIUM CHLORIDE 0.9 % IV SOLN: INTRAVENOUS | Status: DC

## 2018-03-09 MED ORDER — MIDAZOLAM HCL 5 MG/5ML IJ SOLN
INTRAMUSCULAR | Status: DC | PRN
  Administered 2018-03-09 (×4): 2 mg via INTRAVENOUS

## 2018-03-09 NOTE — Progress Notes (Addendum)
Patient Demographics:    Elijah Mcfarland, is a 68 y.o. male, DOB - 1950-04-29, ZLD:[id number]  Admit date - 03/06/2018   Admitting Physician Orson Eva, MD  Outpatient Primary MD for the patient is Baity, Coralie Keens, NP  LOS - 1   Chief Complaint  Patient presents with  . Hematemesis        Subjective:    Elijah Mcfarland today has no fevers, no emesis,  No chest pain,, sister at bedside,  Assessment  & Plan :    Active Problems:   HYPERLIPIDEMIA, MIXED   TOBACCO ABUSE   Upper GI bleed   Acute blood loss anemia  Brief summary 68 year old with past medical history relevant for COPD, HTN, tobacco abuse admitted on 03/06/2018 with hematemesis, underwent EGD on 03/07/2018 without source of bleeding, capsule endoscopy on 03/08/2018 images that raised suspicion of Dieulafoy lesion not seen on EGD, repeat EGD 03/09/2018 again without bleeding source noted  PLan:- 1) acute GI bleed--- hemoglobin is down to 9.0 from 9.98 despite transfusion of additional 1 unit units of PRBCs on 03/08/18 (total of 3 units of packed cells at this time),, suspect some degree of hemodilution... no further hematemesis, c/n PPI, per GI service okay to advance diet ,   2)COPD--- no significant exacerbation at this time, continue bronchodilators, Pulmicort, smoking cessation advised  3)HTN--- stable,  may use IV Hydralazine 10 mg  Every 4 hours Prn for systolic blood pressure over 160 mmhg  4)Anxiety/Depression--- stable, continue Celexa 40 mg daily and Xanax 0.5 mg twice daily as needed  5)H/o SZ--- stable, continue Dilantin 300 mg qam  6) thrombocytopenia--- suspect Thrombocytopenia of consumption due to GI bleed  (down from 222  to 73, now back up to 186)  7) acute blood loss anemia--- secondary to #1 above, patient has received 3 units of packed cells this admission so far  Disposition/Need for in-Hospital Stay- patient unable to  be discharged at this time due to GI bleed---  per GI service recommendations,  may need further transfusion if H&H continues to drop, possible discharge in a.m. if H&H is stable and tolerating oral intake  Code Status : Full  Family Communication:   na   Disposition Plan  : possible discharge in a.m. if H&H is stable and tolerating oral intake  Consults  :  Gi  DVT Prophylaxis  :   SCDs  (GI bleed)  Lab Results  Component Value Date   PLT 186 03/09/2018    Inpatient Medications  Scheduled Meds: . atorvastatin  10 mg Oral q1800  . budesonide (PULMICORT) nebulizer solution  0.5 mg Nebulization BID  . citalopram  40 mg Oral q morning - 10a  . ipratropium-albuterol  3 mL Nebulization BID  . lidocaine      . mouth rinse  15 mL Mouth Rinse BID  . meperidine      . midazolam      . nicotine  21 mg Transdermal Daily  . pantoprazole  40 mg Oral BID  . phenytoin  300 mg Oral q morning - 10a   Continuous Infusions:  PRN Meds:.acetaminophen **OR** acetaminophen, ALPRAZolam, hydrALAZINE, ondansetron **OR** ondansetron (ZOFRAN) IV    Anti-infectives (From admission, onward)   None  Objective:   Vitals:   03/09/18 1425 03/09/18 1430 03/09/18 1435 03/09/18 1632  BP: 97/66 99/68  107/64  Pulse: 83 88 82 84  Resp: 12 16 14 18   Temp:    98 F (36.7 C)  TempSrc:    Oral  SpO2: 100% 100% 100% 100%  Weight:      Height:        Wt Readings from Last 3 Encounters:  03/06/18 57.9 kg  03/01/18 59.2 kg  10/13/17 54.4 kg     Intake/Output Summary (Last 24 hours) at 03/09/2018 1804 Last data filed at 03/09/2018 0900 Gross per 24 hour  Intake 1915.53 ml  Output -  Net 1915.53 ml     Physical Exam Patient is examined daily including today on 03/09/18 , exams remain the same as of yesterday except that has changed   Gen:- Awake Alert,  In no apparent distress  HEENT:- [place].AT, No sclera icterus Neck-Supple Neck,No JVD,.  Lungs-  CTAB , fair symmetrical air  movement CV- S1, S2 normal, regular  Abd-  +ve B.Sounds, Abd Soft, mild epigastric discomfort without rebound or guarding  extremity/Skin:- No  edema, pedal pulses present  Psych-affect is appropriate, oriented x3 Neuro-no new focal deficits, no tremors   Data Review:   Micro Results No results found for this or any previous visit (from the past 240 hour(s)).  Radiology Reports Ct Angio Abd/pel W/ And/or W/o  Result Date: 03/07/2018 CLINICAL DATA:  GI bleeding with increasing hematemesis EXAM: CTA ABDOMEN AND PELVIS wITHOUT AND WITH CONTRAST TECHNIQUE: Multidetector CT imaging of the abdomen and pelvis was performed using the standard protocol during bolus administration of intravenous contrast. Multiplanar reconstructed images and MIPs were obtained and reviewed to evaluate the vascular anatomy. CONTRAST:  139mL ISOVUE-370 IOPAMIDOL (ISOVUE-370) INJECTION 76% COMPARISON:  None. FINDINGS: VASCULAR Aorta: Moderate atheromatous wall thickening and intermittent calcification. No aneurysm or dissection. Celiac: Diffusely patent branches. Negative for pseudoaneurysm, beading, or significant atheromatous changes. SMA: Widely patent vessels.  No notable atheromatous changes. Renals: Single bilateral renal arteries. Atherosclerosis especially proximally on the left without flow limiting stenosis, aneurysm, or beading. IMA: Patent with atheromatous narrowing at the ostium. Inflow: Atherosclerosis without flow limiting stenosis, dissection, or aneurysm. Proximal Outflow: Atherosclerosis without significant stenosis. Veins: Major venous structures are diffusely patent Review of the MIP images confirms the above findings. NON-VASCULAR Lower chest:  Emphysema and airway thickening Hepatobiliary: 7 mm hypervascular focus in the left lateral liver that is blood pool on delayed phase, consistent with hemangioma or small shunt.No evidence of biliary obstruction or stone. Pancreas: Unremarkable. Spleen: Unremarkable.  Adrenals/Urinary Tract: Negative adrenals. No hydronephrosis or stone. Tiny presumed cyst in the left renal cortex. Unremarkable bladder. Stomach/Bowel: No evidence of bowel inflammation. No gross ulcer or mass. Moderate stool volume. Lymphatic: No acute vascular abnormality.  No mass or adenopathy. Reproductive:Enlarged central prostate projecting into the bladder base. The same appearance was seen on a 2012 PET-CT. Other: No ascites or pneumoperitoneum. Musculoskeletal: No acute abnormalities. Spinal degeneration including advanced L5-S1 disc narrowing. IMPRESSION: 1. No emergent finding.  No evident source of GI bleeding. 2. Atherosclerosis. Electronically Signed   By: Monte Fantasia M.D.   On: 03/07/2018 05:49     CBC Recent Labs  Lab 03/03/18 0453 03/06/18 1235 03/07/18 0623 03/08/18 0408 03/08/18 1735 03/09/18 0442  WBC 6.4 10.7* 8.5 5.7  --  6.5  HGB 9.4* 6.5* 8.6* 8.1* 9.8* 9.0*  HCT 29.0* 20.1* 26.8* 24.9* 29.9* 27.7*  PLT 141* 222 178  73*  --  186  MCV 94.8 99.5 94.7 95.8  --  94.9  MCH 30.7 32.2 30.4 31.2  --  30.8  MCHC 32.4 32.3 32.1 32.5  --  32.5  RDW 15.7* 16.4* 16.1* 15.9*  --  15.2  LYMPHSABS  --  1.0  --   --   --   --   MONOABS  --  1.0  --   --   --   --   EOSABS  --  0.3  --   --   --   --   BASOSABS  --  0.0  --   --   --   --     Chemistries  Recent Labs  Lab 03/03/18 0453 03/06/18 1235 03/07/18 0623  NA 141 138 138  K 3.5 4.7 3.9  CL 109 109 111  CO2 24 23 20*  GLUCOSE 95 150* 114*  BUN 7* 35* 20  CREATININE 0.59* 0.77 0.68  CALCIUM 8.2* 7.6* 7.5*  AST 22 15  --   ALT 18 13  --   ALKPHOS 46 35*  --   BILITOT 0.5 0.3  --    ------------------------------------------------------------------------------------------------------------------ No results for input(s): CHOL, HDL, LDLCALC, TRIG, CHOLHDL, LDLDIRECT in the last 72 hours.  Lab Results  Component Value Date   HGBA1C 5.8 08/22/2017    ------------------------------------------------------------------------------------------------------------------   Roxan Hockey M.D on 03/09/2018 at 6:04 PM  Go to [email or web address] - for contact info  [facility] - Office  [phone]

## 2018-03-09 NOTE — Op Note (Signed)
[facility] Patient Name: Elijah Mcfarland Procedure Date: 03/09/2018 1:54 PM MRN: [id number] Date of Birth: 02/13/1951 Attending MD: Hildred Laser , MD CSN: [id number] Age: 68 Admit Type: Inpatient Procedure:                Upper GI endoscopy Indications:              Hematemesis, Abnormal video capsule endoscopy Providers:                Hildred Laser, MD, Gwynneth Albright RN, RN,                            Gerome Sam, RN, Randa Spike, Technician Referring MD:              Medicines:                Lidocaine spray, Meperidine 50 mg IV, Midazolam 8                            mg IV Complications:            No immediate complications. Estimated Blood Loss:     Estimated blood loss: none. Procedure:                Pre-Anesthesia Assessment:                           - Prior to the procedure, a History and Physical                            was performed, and patient medications and                            allergies were reviewed. The patient's tolerance of                            previous anesthesia was also reviewed. The risks                            and benefits of the procedure and the sedation                            options and risks were discussed with the patient.                            All questions were answered, and informed consent                            was obtained. Prior Anticoagulants: The patient                            last took previous NSAID medication 8 days prior to                            the procedure. ASA Grade Assessment: III - A  patient with severe systemic disease. After                            reviewing the risks and benefits, the patient was                            deemed in satisfactory condition to undergo the                            procedure.                           After obtaining informed consent, the endoscope was                            passed under direct vision.  Throughout the                            procedure, the patient's blood pressure, pulse, and                            oxygen saturations were monitored continuously. The                            [id number]-H190 ([id number]) scope was introduced through the                            mouth, and advanced to the third part of duodenum.                            The upper GI endoscopy was accomplished without                            difficulty. The patient tolerated the procedure                            well. Scope In: 2:10:47 PM Scope Out: 2:28:40 PM Total Procedure Duration: 0 hours 17 minutes 53 seconds  Findings:      The hypopharynx was normal.      The larynx was normal.      The examined esophagus was normal.      The Z-line was irregular and was found 40 cm from the incisors.      A 2 cm hiatal hernia was present.      The entire examined stomach was normal.      The duodenal bulb, second portion of the duodenum and third portion of       the duodenum were normal. Impression:               - Normal hypopharynx.                           - Normal larynx.                           - Normal esophagus.                           -  Z-line irregular, 40 cm from the incisors.                           - 2 cm hiatal hernia.                           - Normal stomach.                           - Normal duodenal bulb, second portion of the                            duodenum and third portion of the duodenum.                           - No specimens collected.                           Comment: No bleeding lesion identified on 3rd EGD. Moderate Sedation:      Moderate (conscious) sedation was administered by the endoscopy nurse       and supervised by the endoscopist. The following parameters were       monitored: oxygen saturation, heart rate, blood pressure, CO2       capnography and response to care. Total physician intraservice time was       24 minutes. Recommendation:           -  Return patient to hospital ward for ongoing care.                           - Low sodium diet today.                           - Continue present medications.                           - Urgent evaluation if he rebleeds. Procedure Code(s):        --- Professional ---                           [phone], Esophagogastroduodenoscopy, flexible,                            transoral; diagnostic, including collection of                            specimen(s) by brushing or washing, when performed                            (separate procedure)                           99153, Moderate sedation; each additional 15                            minutes intraservice time  G0500, Moderate sedation services provided by the                            same physician or other qualified health care                            professional performing a gastrointestinal                            endoscopic service that sedation supports,                            requiring the presence of an independent trained                            observer to assist in the monitoring of the                            patient's level of consciousness and physiological                            status; initial 15 minutes of intra-service time;                            patient age 40 years or older (additional time may                            be reported with [phone], as appropriate) Diagnosis Code(s):        --- Professional ---                           K22.8, Other specified diseases of esophagus                           K44.9, Diaphragmatic hernia without obstruction or                            gangrene                           K92.0, Hematemesis                           R93.3, Abnormal findings on diagnostic imaging of                            other parts of digestive tract CPT copyright 2018 [employer]. All rights reserved. The codes documented in this report are  preliminary and upon coder review may  be revised to meet current compliance requirements. Hildred Laser, MD Hildred Laser, MD 03/09/2018 2:45:05 PM This report has been signed electronically. Number of Addenda: 0

## 2018-03-09 NOTE — Progress Notes (Signed)
Brief EGD note: Normal hypopharynx and laryngeal inlet. Normal mucosa of the esophagus. Irregular GE junction.  Small sliding hiatal hernia. No bleeding lesion identified in the stomach first second or third part of duodenum.

## 2018-03-10 ENCOUNTER — Encounter (HOSPITAL_COMMUNITY): Payer: Self-pay | Admitting: Internal Medicine

## 2018-03-10 ENCOUNTER — Other Ambulatory Visit (INDEPENDENT_AMBULATORY_CARE_PROVIDER_SITE_OTHER): Payer: Self-pay | Admitting: Internal Medicine

## 2018-03-10 LAB — BPAM RBC
Blood Product Expiration Date: 202002222359
Blood Product Expiration Date: 202002222359
Blood Product Expiration Date: 202002262359
Blood Product Expiration Date: 202002262359
ISSUE DATE / TIME: 202001201425
ISSUE DATE / TIME: 202001202013
ISSUE DATE / TIME: 202001221157
Unit Type and Rh: 5100
Unit Type and Rh: 5100
Unit Type and Rh: 5100
Unit Type and Rh: 5100

## 2018-03-10 LAB — TYPE AND SCREEN
ABO/RH(D): O POS
Antibody Screen: NEGATIVE
Unit division: 0
Unit division: 0
Unit division: 0
Unit division: 0

## 2018-03-10 LAB — BASIC METABOLIC PANEL
Anion gap: 7 (ref 5–15)
BUN: 6 mg/dL — AB (ref 8–23)
CO2: 24 mmol/L (ref 22–32)
CREATININE: 0.71 mg/dL (ref 0.61–1.24)
Calcium: 8.1 mg/dL — ABNORMAL LOW (ref 8.9–10.3)
Chloride: 110 mmol/L (ref 98–111)
GFR calc Af Amer: >60 mL/min (ref >60)
GFR calc non Af Amer: >60 mL/min (ref >60)
Glucose, Bld: 91 mg/dL (ref 70–99)
Potassium: 4.1 mmol/L (ref 3.5–5.1)
Sodium: 141 mmol/L (ref 135–145)

## 2018-03-10 LAB — CBC
HCT: 31.3 % — ABNORMAL LOW (ref 39.0–52.0)
Hemoglobin: 9.9 g/dL — ABNORMAL LOW (ref 13.0–17.0)
MCH: 31.2 pg (ref 26.0–34.0)
MCHC: 31.6 g/dL (ref 30.0–36.0)
MCV: 98.7 fL (ref 80.0–100.0)
Platelets: 178 10*3/uL (ref 150–400)
RBC: 3.17 MIL/uL — ABNORMAL LOW (ref 4.22–5.81)
RDW: 15.1 % (ref 11.5–15.5)
WBC: 7.4 10*3/uL (ref 4.0–10.5)
nRBC: 0 % (ref 0.0–0.2)

## 2018-03-10 MED ORDER — ACETAMINOPHEN 325 MG PO TABS: 650.0000 mg | ORAL_TABLET | Freq: Four times a day (QID) | ORAL | 2 refills | Status: AC | PRN

## 2018-03-10 MED ORDER — ONDANSETRON HCL 4 MG PO TABS: 4.0000 mg | ORAL_TABLET | Freq: Four times a day (QID) | ORAL | 0 refills | Status: DC | PRN

## 2018-03-10 MED ORDER — ASPIRIN 81 MG PO TABS: 81.0000 mg | ORAL_TABLET | Freq: Every day | ORAL | 0 refills | Status: DC

## 2018-03-10 MED ORDER — ATORVASTATIN CALCIUM 10 MG PO TABS: ORAL_TABLET | ORAL | 2 refills | Status: DC

## 2018-03-10 MED ORDER — FLUTICASONE-SALMETEROL 250-50 MCG/DOSE IN AEPB: 1.0000 | INHALATION_SPRAY | Freq: Two times a day (BID) | RESPIRATORY_TRACT | 2 refills | Status: DC

## 2018-03-10 MED ORDER — PANTOPRAZOLE SODIUM 40 MG PO TBEC: 40.0000 mg | DELAYED_RELEASE_TABLET | Freq: Every day | ORAL | 1 refills | Status: DC

## 2018-03-10 MED ORDER — NICOTINE 14 MG/24HR TD PT24: 14.0000 mg | MEDICATED_PATCH | Freq: Every day | TRANSDERMAL | 2 refills | Status: DC

## 2018-03-10 MED ORDER — ALBUTEROL SULFATE HFA 108 (90 BASE) MCG/ACT IN AERS: 2.0000 | INHALATION_SPRAY | Freq: Four times a day (QID) | RESPIRATORY_TRACT | 2 refills | Status: DC | PRN

## 2018-03-10 NOTE — Progress Notes (Signed)
Subjective and objective:  Patient has no complaints.  He denies nausea vomiting chest pain heartburn or abdominal pain.  He has been walking the bathroom and not getting dizzy or lightheaded. He had small amount of formed stool last night was black. His vital signs are stable.   Abdominal exam is within normal limits. Hemoglobin 9.9.  Assessment:  With emesis/upper GI bleed from an unknown source. He possibly has due to for lesion which was not seen since he is not actively bleeding on 2 EGDs done during this admission and one EGD done last week. Patient stable for discharge. Once again patient advised not to take aspirin or any other NSAIDs. Patient advised to report to emergency room if he has another episode of hematemesis. I will also asked him to call if he has epistaxis or hemoptysis.  H. pylori infection to be treated at a later date.  Recommendations: Patient stable for discharge. We will check H&H next week.  My office will call patient. Will arrange for office visit in 3 to 4 weeks.

## 2018-03-10 NOTE — Discharge Summary (Signed)
Elijah Mcfarland, is a 68 y.o. male  DOB [birth year]  MRN [id number].  Admission date:  03/06/2018  Admitting Physician  Orson Eva, MD  Discharge Date:  03/10/2018   Primary MD  [name], NP  Recommendations for primary care physician for things to follow:   1)Avoid ibuprofen/Advil/Aleve/Motrin/Goody Powders/Naproxen/BC powders/Meloxicam/Diclofenac/Indomethacin and other Nonsteroidal anti-inflammatory medications as these will make you more likely to bleed and can cause stomach ulcers, can also cause Kidney problems.   2) take Protonix 40 mg daily continuously  3) follow-up with gastroenterologist Dr. Dereck Leep as advised for reevaluation and to discuss treatment for Helicobacter pylori stomach infection  4) call or return if coffee-ground emesis, vomiting bright red blood, or bright red blood per rectum/rectal bleeding or any other concerns  5) you need to repeat CBC and BMP blood test within a week with your regular doctor or with Dr. Dereck Leep   Admission Diagnosis  Gastrointestinal hemorrhage, unspecified gastrointestinal hemorrhage type [K92.2]   Discharge Diagnosis  Gastrointestinal hemorrhage, unspecified gastrointestinal hemorrhage type [K92.2]   Active Problems:   HYPERLIPIDEMIA, MIXED   TOBACCO ABUSE   Upper GI bleed   Acute blood loss anemia      Past Medical History:  Diagnosis Date  . Chronic pain syndrome    Left knee  . COPD (chronic obstructive pulmonary disease) ([place])   . DDD (degenerative disc disease), lumbar 11/2008   L5-S1 on plain film  . Emphysema lung ([place])   . GAD (generalized anxiety disorder)   . GERD (gastroesophageal reflux disease)   . History of multiple pulmonary nodules 07/2010   Follow up CT scans showed resolution by 03/2011--NO MALIGNANCY.  Marland Kitchen History of pneumonia 06/2010  . HTN (hypertension) 01/07/2014  . Osteoarthritis of left knee    + past injury of this  knee--tibia fracture?  +left leg shorter than right  . Seizure ([place])   . Seizure disorder ([place])    3 total seizures (first was 1995); neuro eval done and recommended lifetime phenytoin (he had a seizure after coming off the med in the late 90s  . Tobacco dependence     Past Surgical History:  Procedure Laterality Date  . COLONOSCOPY  2010   At [facility]; normal per pt  . COLONOSCOPY WITH PROPOFOL N/A 09/28/2017   Procedure: COLONOSCOPY WITH PROPOFOL;  Surgeon: [name], MD;  Location: [facility] ENDOSCOPY;  Service: Gastroenterology;  Laterality: N/A;  . COLONOSCOPY WITH PROPOFOL N/A 10/13/2017   Procedure: COLONOSCOPY WITH PROPOFOL;  Surgeon: Lin Landsman, MD;  Location: [facility] ENDOSCOPY;  Service: Gastroenterology;  Laterality: N/A;  . ESOPHAGOGASTRODUODENOSCOPY N/A 03/01/2018   Procedure: ESOPHAGOGASTRODUODENOSCOPY (EGD);  Surgeon: Rogene Houston, MD;  Location: [facility] ENDO SUITE;  Service: Endoscopy;  Laterality: N/A;  . ESOPHAGOGASTRODUODENOSCOPY N/A 03/06/2018   Procedure: ESOPHAGOGASTRODUODENOSCOPY (EGD);  Surgeon: Rogene Houston, MD;  Location: [facility] ENDO SUITE;  Service: Endoscopy;  Laterality: N/A;  . GIVENS CAPSULE STUDY N/A 03/08/2018   Procedure: GIVENS CAPSULE STUDY;  Surgeon: Rogene Houston, MD;  Location: [facility] ENDO SUITE;  Service: Endoscopy;  Laterality: N/A;  . KNEE SURGERY     Left : x 2     HPI  from the history and physical done on the day of admission:    HPI:  Elijah Mcfarland is a 69 y.o. male with medical history of COPD, hypertension, epilepsy, tobacco abuse presenting with episode of hematemesis on the morning of 03/06/2018.  Patient was recently admitted to the hospital on 02/28/2018 through 03/03/2018 for an upper GI bleed.  During that hospitalization, the patient underwent an EGD on 03/01/2018 by Dr. Laural Golden which showed superficial esophageal ulcer without bleeding, grade a esophagitis, blood and clots in the stomach, and a single nonbleeding ulceration in the gastric body.   It was thought this is due to heavy use of NSAIDs.  The patient previously drank heavily, but has not drank any alcohol since October 2015.  The patient was discharged home with Protonix once daily.  Since discharge home, the patient had denied any NSAIDs or alcohol use.  He endorses compliance with his Protonix.  He has not had any emesis until the morning of 11/05/2018.  In the emergency department, the patient had a melanotic stool without blood.  He has some dizziness but denied any chest discomfort, abdominal pain, dysuria, hematuria.  He has had some intermittent chest discomfort.  Unfortunately, he went back to smoking after discharge.  In the emergency department, the patient was afebrile with initial soft blood pressure of 89/67.  This did improve with fluid resuscitation up to 112/75.  Hemoglobin was 6.5.  His discharge hemoglobin was 9.4 on 03/03/2018.  BMP and hepatic panel unremarkable.  2 units PRBC were ordered.  GI, Dr. Laural Golden was consulted.    Hospital Course:   Brief Summary 68 year old with past medical history relevant for COPD, HTN, tobacco abuse admitted on 03/06/2018 with hematemesis, underwent EGD on 03/07/2018 without source of bleeding, capsule endoscopy on 03/08/2018 images that raised suspicion of Dieulafoy lesion not seen on EGD, repeat EGD 03/09/2018 again without bleeding source noted  PLan:- 1) acute GI bleed--- hemoglobin is 9.9 after transfusion of a total of 3 units of PRBCs this admission, suspect some degree of hemodilution... no further hematemesis, c/n Protonix 40 mg daily, tolerating solid food okay, please see brief summary above regarding serial EGDs  2)COPD--- no significant exacerbation at this time, continue bronchodilators, Pulmicort, smoking cessation advised  3)HTN--- stable,   resume losartan  4)Anxiety/Depression--- stable, continue Celexa 40 mg daily and Xanax 0.5 mg twice daily as needed  5)H/o SZ--- stable, continue Dilantin 300 mg qam  6)  thrombocytopenia--- suspect Thrombocytopenia of consumption due to GI bleed  (down from 222  to 73, now back up to > 170)  7) acute blood loss anemia--- secondary to #1 above, patient has received 3 units of packed cells this admission so far   8)H pylori--- defer treatment to GI service as outpatient   Code Status : Full  Family Communication:    Sister at bedside   Disposition Plan  :  Home as patient is tolerating oral intake well, H&H is stable, no further hematemesis Consults  :  Gi   Discharge Condition: stable  Follow UP--- with Dr. Laural Golden from GI service as advised  Diet and Activity recommendation:  As advised  Discharge Instructions    Discharge Instructions    Call MD for:  difficulty breathing, headache or visual disturbances   Complete by:  As directed    Call MD for:  persistant dizziness or  light-headedness   Complete by:  As directed    Call MD for:  persistant nausea and vomiting   Complete by:  As directed    Call MD for:  severe uncontrolled pain   Complete by:  As directed    Call MD for:  temperature >100.4   Complete by:  As directed    Diet - low sodium heart healthy   Complete by:  As directed    Discharge instructions   Complete by:  As directed    1)Avoid ibuprofen/Advil/Aleve/Motrin/Goody Powders/Naproxen/BC powders/Meloxicam/Diclofenac/Indomethacin and other Nonsteroidal anti-inflammatory medications as these will make you more likely to bleed and can cause stomach ulcers, can also cause Kidney problems.   2) take Protonix 40 mg daily continuously  3) follow-up with gastroenterologist Dr. Dereck Leep as advised for reevaluation and to discuss treatment for Helicobacter pylori stomach infection  4) call or return if coffee-ground emesis, vomiting bright red blood, or bright red blood per rectum/rectal bleeding or any other concerns  5) you need to repeat CBC and BMP blood test within a week with your regular doctor or with Dr. Dereck Leep    Increase activity slowly   Complete by:  As directed         Discharge Medications     Allergies as of 03/10/2018   No Known Allergies     Medication List    STOP taking these medications   Fluticasone-Salmeterol 500-50 MCG/DOSE Aepb Commonly known as:  ADVAIR Replaced by:  Fluticasone-Salmeterol 250-50 MCG/DOSE Aepb   nicotine 21 mg/24hr patch Commonly known as:  NICODERM CQ - dosed in mg/24 hours Replaced by:  nicotine 14 mg/24hr patch     TAKE these medications   acetaminophen 325 MG tablet Commonly known as:  TYLENOL Take 2 tablets (650 mg total) by mouth every 6 (six) hours as needed for mild pain or headache (or Fever >/= 101).   albuterol 108 (90 Base) MCG/ACT inhaler Commonly known as:  PROVENTIL HFA;VENTOLIN HFA Inhale 1-2 puffs into the lungs every 6 (six) hours as needed for wheezing or shortness of breath. What changed:  Another medication with the same name was added. Make sure you understand how and when to take each.   albuterol 108 (90 Base) MCG/ACT inhaler Commonly known as:  PROVENTIL HFA;VENTOLIN HFA Inhale 2 puffs into the lungs every 6 (six) hours as needed for wheezing or shortness of breath. What changed:  You were already taking a medication with the same name, and this prescription was added. Make sure you understand how and when to take each.   ALPRAZolam 0.5 MG tablet Commonly known as:  XANAX TAKE 1 TABLET(0.5 MG) BY MOUTH TWICE DAILY AS NEEDED   aspirin 81 MG tablet Take 1 tablet (81 mg total) by mouth daily with breakfast. Restart on 03/18/2018 if no further bleeding What changed:    when to take this  additional instructions   atorvastatin 10 MG tablet Commonly known as:  LIPITOR Q PM What changed:  See the new instructions.   citalopram 40 MG tablet Commonly known as:  CELEXA TAKE 1 TABLET EVERY DAY What changed:  when to take this   Fluticasone-Salmeterol 250-50 MCG/DOSE Aepb Commonly known as:  ADVAIR DISKUS Inhale 1 puff  into the lungs 2 (two) times daily. Replaces:  Fluticasone-Salmeterol 500-50 MCG/DOSE Aepb   losartan 50 MG tablet Commonly known as:  COZAAR TAKE 1 TABLET EVERY DAY What changed:  when to take this   nicotine 14 mg/24hr patch Commonly known as:  NICODERM CQ - dosed in mg/24 hours Place 1 patch (14 mg total) onto the skin daily. Replaces:  nicotine 21 mg/24hr patch   ondansetron 4 MG tablet Commonly known as:  ZOFRAN Take 1 tablet (4 mg total) by mouth every 6 (six) hours as needed for nausea or vomiting.   pantoprazole 40 MG tablet Commonly known as:  PROTONIX Take 1 tablet (40 mg total) by mouth daily. What changed:  when to take this   phenytoin 100 MG ER capsule Commonly known as:  DILANTIN TAKE 3 CAPSULES EVERY DAY What changed:  See the new instructions.       Major procedures and Radiology Reports - PLEASE review detailed and final reports for all details, in brief -   Ct Angio Abd/pel W/ And/or W/o  Result Date: 03/07/2018 CLINICAL DATA:  GI bleeding with increasing hematemesis EXAM: CTA ABDOMEN AND PELVIS wITHOUT AND WITH CONTRAST TECHNIQUE: Multidetector CT imaging of the abdomen and pelvis was performed using the standard protocol during bolus administration of intravenous contrast. Multiplanar reconstructed images and MIPs were obtained and reviewed to evaluate the vascular anatomy. CONTRAST:  179mL ISOVUE-370 IOPAMIDOL (ISOVUE-370) INJECTION 76% COMPARISON:  None. FINDINGS: VASCULAR Aorta: Moderate atheromatous wall thickening and intermittent calcification. No aneurysm or dissection. Celiac: Diffusely patent branches. Negative for pseudoaneurysm, beading, or significant atheromatous changes. SMA: Widely patent vessels.  No notable atheromatous changes. Renals: Single bilateral renal arteries. Atherosclerosis especially proximally on the left without flow limiting stenosis, aneurysm, or beading. IMA: Patent with atheromatous narrowing at the ostium. Inflow:  Atherosclerosis without flow limiting stenosis, dissection, or aneurysm. Proximal Outflow: Atherosclerosis without significant stenosis. Veins: Major venous structures are diffusely patent Review of the MIP images confirms the above findings. NON-VASCULAR Lower chest:  Emphysema and airway thickening Hepatobiliary: 7 mm hypervascular focus in the left lateral liver that is blood pool on delayed phase, consistent with hemangioma or small shunt.No evidence of biliary obstruction or stone. Pancreas: Unremarkable. Spleen: Unremarkable. Adrenals/Urinary Tract: Negative adrenals. No hydronephrosis or stone. Tiny presumed cyst in the left renal cortex. Unremarkable bladder. Stomach/Bowel: No evidence of bowel inflammation. No gross ulcer or mass. Moderate stool volume. Lymphatic: No acute vascular abnormality.  No mass or adenopathy. Reproductive:Enlarged central prostate projecting into the bladder base. The same appearance was seen on a 2012 PET-CT. Other: No ascites or pneumoperitoneum. Musculoskeletal: No acute abnormalities. Spinal degeneration including advanced L5-S1 disc narrowing. IMPRESSION: 1. No emergent finding.  No evident source of GI bleeding. 2. Atherosclerosis. Electronically Signed   By: Monte Fantasia M.D.   On: 03/07/2018 05:49    Micro Results   No results found for this or any previous visit (from the past 240 hour(s)).     Today   Subjective    Elijah Mcfarland today has no new complaints, No fever  Or chills, no nausea, vomiting or diarrhea,          Patient has been seen and examined prior to discharge   Objective   Blood pressure 121/82, pulse 82, temperature 98.1 F (36.7 C), temperature source Oral, resp. rate 18, height 5\' 8"  (1.727 m), weight 57.9 kg, SpO2 95 %.   Intake/Output Summary (Last 24 hours) at 03/10/2018 1325 Last data filed at 03/10/2018 0900 Gross per 24 hour  Intake 480 ml  Output -  Net 480 ml    Exam Gen:- Awake Alert, no acute distress  HEENT:-  [place].AT, No sclera icterus Neck-Supple Neck,No JVD,.  Lungs-  CTAB , good air movement bilaterally  CV-  S1, S2 normal, regular Abd-  +ve B.Sounds, Abd Soft, No tenderness,    Extremity/Skin:- No  edema,   good pulses Psych-affect is appropriate, oriented x3 Neuro-no new focal deficits, no tremors    Data Review   CBC w Diff:  Lab Results  Component Value Date   WBC 7.4 03/10/2018   HGB 9.9 (L) 03/10/2018   HCT 31.3 (L) 03/10/2018   PLT 178 03/10/2018   LYMPHOPCT 9 03/06/2018   MONOPCT 10 03/06/2018   EOSPCT 3 03/06/2018   BASOPCT 0 03/06/2018    CMP:  Lab Results  Component Value Date   NA 141 03/10/2018   K 4.1 03/10/2018   CL 110 03/10/2018   CO2 24 03/10/2018   BUN 6 (L) 03/10/2018   CREATININE 0.71 03/10/2018   CREATININE 0.81 11/03/2012   PROT 5.1 (L) 03/06/2018   ALBUMIN 2.7 (L) 03/06/2018   BILITOT 0.3 03/06/2018   ALKPHOS 35 (L) 03/06/2018   AST 15 03/06/2018   ALT 13 03/06/2018  .   Total Discharge time is about 33 minutes  Roxan Hockey M.D on 03/10/2018 at 1:25 PM  Go to [email or web address] -  for contact info  [facility] - Office  [phone]

## 2018-03-10 NOTE — Progress Notes (Signed)
Patient and wife given discharge instructions for home. Patient dressed himself and PIV x1 was removed. Discharged home with wife.

## 2018-03-10 NOTE — Care Management Important Message (Signed)
Important Message  Patient Details  Name: Elijah Mcfarland MRN: [id number] Date of Birth: 03/01/1950   Medicare Important Message Given:  Yes    Shelda Altes 03/10/2018, 3:01 PM

## 2018-03-10 NOTE — Discharge Instructions (Signed)
1)Avoid ibuprofen/Advil/Aleve/Motrin/Goody Powders/Naproxen/BC powders/Meloxicam/Diclofenac/Indomethacin and other Nonsteroidal anti-inflammatory medications as these will make you more likely to bleed and can cause stomach ulcers, can also cause Kidney problems.   2) take Protonix 40 mg daily continuously  3) follow-up with gastroenterologist Dr. Dereck Leep as advised for reevaluation and to discuss treatment for Helicobacter pylori stomach infection  4) call or return if coffee-ground emesis, vomiting bright red blood, or bright red blood per rectum/rectal bleeding or any other concerns  5) you need to repeat CBC and BMP blood test within a week with your regular doctor or with Dr. Dereck Leep

## 2018-03-13 ENCOUNTER — Other Ambulatory Visit (INDEPENDENT_AMBULATORY_CARE_PROVIDER_SITE_OTHER): Payer: Self-pay | Admitting: *Deleted

## 2018-03-13 ENCOUNTER — Telehealth: Payer: Self-pay

## 2018-03-13 ENCOUNTER — Telehealth (INDEPENDENT_AMBULATORY_CARE_PROVIDER_SITE_OTHER): Payer: Self-pay | Admitting: *Deleted

## 2018-03-13 DIAGNOSIS — D649 Anemia, unspecified: Secondary | ICD-10-CM

## 2018-03-13 DIAGNOSIS — K922 Gastrointestinal hemorrhage, unspecified: Secondary | ICD-10-CM

## 2018-03-13 NOTE — Telephone Encounter (Signed)
Patient was called and made aware of his lab work to be drawn this Thursday,03/16/2018. Per Dr.Rehman the patient will need to have OV in 3-4 weeks.  See below.  Patient stable for discharge. We will check H&H next week.  My office will call patient. Will arrange for office visit in 3 to 4 weeks.

## 2018-03-13 NOTE — Telephone Encounter (Signed)
Per discharge summary, patient is following up with GI/Dr. Dereck Leep. Patient has appt scheduled for labs on 03/16/18. Per GI, patient will schedule appt with Dr. Dereck Leep n 3-4 weeks.  Patient does not wish to see PCP for hospitalization until after he sees Dr. Dereck Leep.   Per patient, he want to discuss alternative pain management with PCP after lab results have been reviewed by GI.   PCP notified.

## 2018-03-13 NOTE — Telephone Encounter (Signed)
noted 

## 2018-03-16 ENCOUNTER — Other Ambulatory Visit: Payer: Self-pay

## 2018-03-16 DIAGNOSIS — K922 Gastrointestinal hemorrhage, unspecified: Secondary | ICD-10-CM | POA: Diagnosis not present

## 2018-03-16 DIAGNOSIS — D649 Anemia, unspecified: Secondary | ICD-10-CM | POA: Diagnosis not present

## 2018-03-16 LAB — HEMOGLOBIN AND HEMATOCRIT, BLOOD
HCT: 34.2 % — ABNORMAL LOW (ref 38.5–50.0)
Hemoglobin: 11.7 g/dL — ABNORMAL LOW (ref 13.2–17.1)

## 2018-03-16 NOTE — Patient Outreach (Signed)
[facility]) Care Management  03/16/2018  Wolfgang Finigan 26-Jan-1951 [id number]   EMMI- General Discharge RED ON EMMI ALERT Day # 4 Date:  03/15/2018 Red Alert Reason:  Unfilled prescriptions? Yes  Outreach attempt: no answer.  Unable to leave a voice message.  Plan: RN CM will attempt again within 4 business days and send letter.  Jone Baseman, RN, MSN [facility] Care Management Care Management Coordinator Direct Line [phone] Toll Free: [phone]  Fax: [phone]

## 2018-03-17 ENCOUNTER — Other Ambulatory Visit: Payer: Self-pay

## 2018-03-17 NOTE — Patient Outreach (Signed)
[facility]) Care Management  03/17/2018  Elijah Mcfarland [birth year] [id number]   EMMI- General Discharge RED ON EMMI ALERT Day # 4 Date: 03/15/2018 Red Alert Reason: Unfilled prescriptions? Yes  Outreach attempt: spoke with patient.  He reports that he is doing ok.  Addressed red alert with patient.  He states that he has all his medications and no questions with medications.  He states he went yesterday for blood work and will see his PCP on 04-03-18.  Patient denies any problems with transportation.  Advised patient on GI bleed and seeking medication treatment.  He verbalized understanding and denies any needs presently.    Plan: RN CM will close case.   Jone Baseman, RN, MSN [place] [facility] Care Management Coordinator Direct Line [phone] Cell [phone] Toll Free: [phone]  Fax: [phone]

## 2018-03-23 ENCOUNTER — Other Ambulatory Visit (INDEPENDENT_AMBULATORY_CARE_PROVIDER_SITE_OTHER): Payer: Self-pay | Admitting: *Deleted

## 2018-03-23 ENCOUNTER — Encounter (INDEPENDENT_AMBULATORY_CARE_PROVIDER_SITE_OTHER): Payer: Self-pay | Admitting: *Deleted

## 2018-03-23 DIAGNOSIS — K920 Hematemesis: Secondary | ICD-10-CM

## 2018-03-23 DIAGNOSIS — K922 Gastrointestinal hemorrhage, unspecified: Secondary | ICD-10-CM

## 2018-03-23 DIAGNOSIS — K921 Melena: Secondary | ICD-10-CM

## 2018-04-03 ENCOUNTER — Encounter: Payer: Self-pay | Admitting: Internal Medicine

## 2018-04-03 ENCOUNTER — Ambulatory Visit (INDEPENDENT_AMBULATORY_CARE_PROVIDER_SITE_OTHER)
Admission: RE | Admit: 2018-04-03 | Discharge: 2018-04-03 | Disposition: A | Discharge status: Home / Self Care | Payer: Medicare HMO | Source: Ambulatory Visit | Attending: Internal Medicine | Admitting: Internal Medicine

## 2018-04-03 ENCOUNTER — Ambulatory Visit (INDEPENDENT_AMBULATORY_CARE_PROVIDER_SITE_OTHER): Payer: Medicare HMO | Admitting: Internal Medicine

## 2018-04-03 VITALS — BP 124/82 | HR 76 | Temp 98.1°F | Wt 130.0 lb

## 2018-04-03 DIAGNOSIS — G8929 Other chronic pain: Secondary | ICD-10-CM

## 2018-04-03 DIAGNOSIS — M5442 Lumbago with sciatica, left side: Secondary | ICD-10-CM

## 2018-04-03 DIAGNOSIS — F411 Generalized anxiety disorder: Secondary | ICD-10-CM | POA: Diagnosis not present

## 2018-04-03 DIAGNOSIS — M545 Low back pain: Secondary | ICD-10-CM | POA: Diagnosis not present

## 2018-04-03 MED ORDER — PREDNISONE 10 MG PO TABS: ORAL_TABLET | ORAL | 0 refills | Status: DC

## 2018-04-03 MED ORDER — ALPRAZOLAM 0.5 MG PO TABS: ORAL_TABLET | ORAL | 0 refills | Status: DC

## 2018-04-03 NOTE — Progress Notes (Signed)
Subjective:    Patient ID: Elijah Mcfarland, male    DOB: 10-18-1950, 68 y.o.   MRN: [id number]  HPI  Pt presents to the clinic today with c/o low back pain. This is a chronic issue but he reports it is worse in the last 1-2 months. He describes the pain as sore and achy. The pain radiates into his left leg. He has some associated numbness, tingling and weakness in the left leg. He has to walk with a cane. He denies any injury to the area. He denies issues with bowel or bladder. He can no longer take NSAID's due to GI Bleed. He is taking Tylenol with minimal relief. He had a lumbar xray from 2010 which showed degenerative changes from L5-S1.  He would also like a refill of Xanax today.  Review of Systems  Past Medical History:  Diagnosis Date  . Chronic pain syndrome    Left knee  . COPD (chronic obstructive pulmonary disease) ([place])   . DDD (degenerative disc disease), lumbar 11/2008   L5-S1 on plain film  . Emphysema lung ([place])   . GAD (generalized anxiety disorder)   . GERD (gastroesophageal reflux disease)   . History of multiple pulmonary nodules 07/2010   Follow up CT scans showed resolution by 03/2011--NO MALIGNANCY.  Marland Kitchen History of pneumonia 06/2010  . HTN (hypertension) 01/07/2014  . Osteoarthritis of left knee    + past injury of this knee--tibia fracture?  +left leg shorter than right  . Seizure ([place])   . Seizure disorder ([place])    3 total seizures (first was 1995); neuro eval done and recommended lifetime phenytoin (he had a seizure after coming off the med in the late 90s  . Tobacco dependence     Current Outpatient Medications  Medication Sig Dispense Refill  . acetaminophen (TYLENOL) 325 MG tablet Take 2 tablets (650 mg total) by mouth every 6 (six) hours as needed for mild pain or headache (or Fever >/= 101). 30 tablet 2  . albuterol (PROVENTIL HFA;VENTOLIN HFA) 108 (90 Base) MCG/ACT inhaler Inhale 1-2 puffs into the lungs every 6 (six) hours as needed for wheezing or  shortness of breath. 54 g 1  . albuterol (PROVENTIL HFA;VENTOLIN HFA) 108 (90 Base) MCG/ACT inhaler Inhale 2 puffs into the lungs every 6 (six) hours as needed for wheezing or shortness of breath. 1 Inhaler 2  . ALPRAZolam (XANAX) 0.5 MG tablet TAKE 1 TABLET(0.5 MG) BY MOUTH TWICE DAILY AS NEEDED 60 tablet 0  . aspirin 81 MG tablet Take 1 tablet (81 mg total) by mouth daily with breakfast. Restart on 03/18/2018 if no further bleeding 30 tablet 0  . atorvastatin (LIPITOR) 10 MG tablet Q PM 90 tablet 2  . citalopram (CELEXA) 40 MG tablet TAKE 1 TABLET EVERY DAY (Patient taking differently: Take 40 mg by mouth every morning. ) 90 tablet 2  . Fluticasone-Salmeterol (ADVAIR DISKUS) 250-50 MCG/DOSE AEPB Inhale 1 puff into the lungs 2 (two) times daily. 60 each 2  . losartan (COZAAR) 50 MG tablet TAKE 1 TABLET EVERY DAY (Patient taking differently: Take 50 mg by mouth every morning. ) 90 tablet 2  . nicotine (NICODERM CQ - DOSED IN MG/24 HOURS) 14 mg/24hr patch Place 1 patch (14 mg total) onto the skin daily. 30 patch 2  . ondansetron (ZOFRAN) 4 MG tablet Take 1 tablet (4 mg total) by mouth every 6 (six) hours as needed for nausea or vomiting. 20 tablet 0  . pantoprazole (PROTONIX) 40 MG  tablet Take 1 tablet (40 mg total) by mouth daily. 30 tablet 1  . phenytoin (DILANTIN) 100 MG ER capsule TAKE 3 CAPSULES EVERY DAY (Patient taking differently: Take 300 mg by mouth every morning. ) 270 capsule 2   No current facility-administered medications for this visit.     No Known Allergies  Family History  Problem Relation Age of Onset  . Heart disease Mother   . Heart disease Father   . Cancer Sister   . Stroke Neg Hx     Social History   Socioeconomic History  . Marital status: Married    Spouse name: Not on file  . Number of children: Not on file  . Years of education: Not on file  . Highest education level: Not on file  Occupational History  . Not on file  Social Needs  . Financial resource  strain: Not on file  . Food insecurity:    Worry: Not on file    Inability: Not on file  . Transportation needs:    Medical: Not on file    Non-medical: Not on file  Tobacco Use  . Smoking status: Current Every Day Smoker    Packs/day: 1.00    Years: 45.00    Pack years: 45.00    Types: Cigarettes  . Smokeless tobacco: Never Used  Substance and Sexual Activity  . Alcohol use: No    Alcohol/week: 0.0 standard drinks  . Drug use: Not Currently    Types: Marijuana    Comment: 2 month  . Sexual activity: Yes    Partners: Male  Lifestyle  . Physical activity:    Days per week: Not on file    Minutes per session: Not on file  . Stress: Not on file  Relationships  . Social connections:    Talks on phone: Not on file    Gets together: Not on file    Attends religious service: Not on file    Active member of club or organization: Not on file    Attends meetings of clubs or organizations: Not on file    Relationship status: Not on file  . Intimate partner violence:    Fear of current or ex partner: Not on file    Emotionally abused: Not on file    Physically abused: Not on file    Forced sexual activity: Not on file  Other Topics Concern  . Not on file  Social History Narrative   Married, 2 grown children   Eighth grade education.  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   Originally from [place].   Tobacco 100 pack-yr hx, still smoking as of 10/2011 (1 pack per day).   Denies alcohol or drug use.   No exercise.     Constitutional: Denies fever, malaise, fatigue, headache or abrupt weight changes.  Musculoskeletal: Pt reports back pain. Denies decrease in range of motion, difficulty with gait, muscle pain or joint swelling.  Neurological: Pt reports numbness, tingling, weakness of left leg. Denies dizziness, difficulty with memory, difficulty with speech.  Psych: Pt reports anxiety. Denies depression, SI/HI.  No other specific complaints in a  complete review of systems (except as listed in HPI above).     Objective:   Physical Exam  BP 124/82   Pulse 76   Temp 98.1 F (36.7 C) (Oral)   Wt 130 lb (59 kg)   SpO2 97%   BMI 19.77 kg/m  Wt Readings from Last 3 Encounters:  04/03/18  130 lb (59 kg)  03/06/18 127 lb 10.3 oz (57.9 kg)  03/01/18 130 lb 8.2 oz (59.2 kg)    General: Appears his stated age, chronically ill appearing, in NAD. Musculoskeletal: Decreased flexion due to pain. Normal extension and rotation. Bony tenderness noted over the lumbar spine. Normal internal and external rotation of the left hip. No pain with palpation of the left hip. Strength 4/5 LLE, 5/5 RLE. Gait slow and steady with use of cane. Neurological: Alert and oriented. Positive SLR on the left. Psychiatric: Mood and affect normal. Behavior is normal. Judgment and thought content normal.     BMET    Component Value Date/Time   NA 141 03/10/2018 0506   K 4.1 03/10/2018 0506   CL 110 03/10/2018 0506   CO2 24 03/10/2018 0506   GLUCOSE 91 03/10/2018 0506   BUN 6 (L) 03/10/2018 0506   CREATININE 0.71 03/10/2018 0506   CREATININE 0.81 11/03/2012 1011   CALCIUM 8.1 (L) 03/10/2018 0506   GFRNONAA >60 03/10/2018 0506   GFRAA >60 03/10/2018 0506    Lipid Panel     Component Value Date/Time   CHOL 198 08/22/2017 1436   TRIG 145.0 08/22/2017 1436   HDL 62.50 08/22/2017 1436   CHOLHDL 3 08/22/2017 1436   VLDL 29.0 08/22/2017 1436   LDLCALC 107 (H) 08/22/2017 1436    CBC    Component Value Date/Time   WBC 7.4 03/10/2018 0506   RBC 3.17 (L) 03/10/2018 0506   HGB 11.7 (L) 03/16/2018 0937   HCT 34.2 (L) 03/16/2018 0937   PLT 178 03/10/2018 0506   MCV 98.7 03/10/2018 0506   MCH 31.2 03/10/2018 0506   MCHC 31.6 03/10/2018 0506   RDW 15.1 03/10/2018 0506   LYMPHSABS 1.0 03/06/2018 1235   MONOABS 1.0 03/06/2018 1235   EOSABS 0.3 03/06/2018 1235   BASOSABS 0.0 03/06/2018 1235    Hgb A1C Lab Results  Component Value Date   HGBA1C  5.8 08/22/2017            Assessment & Plan:   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

## 2018-04-03 NOTE — Patient Instructions (Signed)
Sciatica    Sciatica is pain, numbness, weakness, or tingling along your sciatic nerve. The sciatic nerve starts in the lower back and goes down the back of each leg. Sciatica happens when this nerve is pinched or has pressure put on it. Sciatica usually goes away on its own or with treatment. Sometimes, sciatica may keep coming back (recur).  Follow these instructions at home:  Medicines  · Take over-the-counter and prescription medicines only as told by your doctor.  · Do not drive or use heavy machinery while taking prescription pain medicine.  Managing pain  · If directed, put ice on the affected area.  ? Put ice in a plastic bag.  ? Place a towel between your skin and the bag.  ? Leave the ice on for 20 minutes, 2-3 times a day.  · After icing, apply heat to the affected area before you exercise or as often as told by your doctor. Use the heat source that your doctor tells you to use, such as a moist heat pack or a heating pad.  ? Place a towel between your skin and the heat source.  ? Leave the heat on for 20-30 minutes.  ? Remove the heat if your skin turns bright red. This is especially important if you are unable to feel pain, heat, or cold. You may have a greater risk of getting burned.  Activity  · Return to your normal activities as told by your doctor. Ask your doctor what activities are safe for you.  ? Avoid activities that make your sciatica worse.  · Take short rests during the day. Rest in a lying or standing position. This is usually better than sitting to rest.  ? When you rest for a long time, do some physical activity or stretching between periods of rest.  ? Avoid sitting for a long time without moving. Get up and move around at least one time each hour.  · Exercise and stretch regularly, as told by your doctor.  · Do not lift anything that is heavier than 10 lb (4.5 kg) while you have symptoms of sciatica.  ? Avoid lifting heavy things even when you do not have symptoms.  ? Avoid lifting  heavy things over and over.  · When you lift objects, always lift in a way that is safe for your body. To do this, you should:  ? Bend your knees.  ? Keep the object close to your body.  ? Avoid twisting.  General instructions  · Use good posture.  ? Avoid leaning forward when you are sitting.  ? Avoid hunching over when you are standing.  · Stay at a healthy weight.  · Wear comfortable shoes that support your feet. Avoid wearing high heels.  · Avoid sleeping on a mattress that is too soft or too hard. You might have less pain if you sleep on a mattress that is firm enough to support your back.  · Keep all follow-up visits as told by your doctor. This is important.  Contact a doctor if:  · You have pain that:  ? Wakes you up when you are sleeping.  ? Gets worse when you lie down.  ? Is worse than the pain you have had in the past.  ? Lasts longer than 4 weeks.  · You lose weight for without trying.  Get help right away if:  · You cannot control when you pee (urinate) or poop (have a bowel movement).  · You   have weakness in any of these areas and it gets worse.  ? Lower back.  ? Lower belly (pelvis).  ? Butt (buttocks).  ? Legs.  · You have redness or swelling of your back.  · You have a burning feeling when you pee.  This information is not intended to replace advice given to you by your health care provider. Make sure you discuss any questions you have with your health care provider.  Document Released: 11/11/2007 Document Revised: 07/10/2015 Document Reviewed: 10/11/2014  Elsevier Interactive Patient Education © 2019 Elsevier Inc.

## 2018-04-05 ENCOUNTER — Encounter: Payer: Self-pay | Admitting: Internal Medicine

## 2018-04-10 ENCOUNTER — Ambulatory Visit (INDEPENDENT_AMBULATORY_CARE_PROVIDER_SITE_OTHER): Payer: Medicare HMO | Admitting: Internal Medicine

## 2018-04-10 ENCOUNTER — Encounter (INDEPENDENT_AMBULATORY_CARE_PROVIDER_SITE_OTHER): Payer: Self-pay | Admitting: Internal Medicine

## 2018-04-10 VITALS — BP 126/74 | HR 82 | Temp 98.1°F | Ht 68.0 in | Wt 126.7 lb

## 2018-04-10 DIAGNOSIS — K922 Gastrointestinal hemorrhage, unspecified: Secondary | ICD-10-CM

## 2018-04-10 DIAGNOSIS — D500 Iron deficiency anemia secondary to blood loss (chronic): Secondary | ICD-10-CM | POA: Diagnosis not present

## 2018-04-10 LAB — HEMOGLOBIN AND HEMATOCRIT, BLOOD
HCT: 38.2 % — ABNORMAL LOW (ref 38.5–50.0)
Hemoglobin: 13 g/dL — ABNORMAL LOW (ref 13.2–17.1)

## 2018-04-10 NOTE — Patient Instructions (Signed)
H and H. OV in 3 months.

## 2018-04-10 NOTE — Progress Notes (Signed)
Subjective:    Patient ID: Elijah Mcfarland, male    DOB: [birth year], 68 y.o.   MRN: [id number]  HPI  Here today for f/u. Recent hx of hematemesis. Admitted to [facility]/2018 and again 1/20/2020and  with hematemesis and profound anemia. ON first admission he received 2 units of PRBCS. He received a total of five units of PRBCs. Suspected UGIB secondary to PUD due to NSAIDS use. Had been having black stools at home. Had been taking Tylenol, BC powders and Goody Powders daily for chronic back pain. Also had been taking Ibuprofen 2-3 a day.  Has not started the ASA yet.  He tells me he is doing good.   Is not taking any NSAIDs. Stools are brown. Has not seen any black stool. Appetite is good. No weight loss.  CT angio abdomen/pelvix w/ and wo CM was normal.    Brought in a stool card today and it was negative.   Has undergone 3 EGD's and one Given Capsule.        Underwent and EGD 03/01/2018 which revealed: Impression: Non-bleeding esophageal ulcer. Possible pill                            esophagitis.                           - LA Grade A reflux esophagitis.                           - 2 cm hiatal hernia.                           - Non-bleeding erosive gastropathy.                           - Normal duodenal bulb, second portion of the                            duodenum and third portion of the duodenum. 03/06/2018 EGD: Impression:               - Normal nasopharynx.                           - Normal esophagus.                           - Z-line irregular, 40 cm from the incisors.                           - 2 cm hiatal hernia.                           - Clear gastric fluid.                           - Non-erosive gastritis.                           - Normal duodenal bulb and second portion of the  duodenum. 03/08/2018 Given Capsule: Findings: Patient was able to swallow given capsule without any difficulty. Single small to moderate sized clot noted covering  antral mucosa.  No active bleeding noted around it. Given capsule reached duodenal bulb in 14 minutes and 34 seconds and then refluxed back into the stomach and eventually passed into duodenum and distally. Normal small bowel mucosa. Coffee-ground material noted at cecum. 03/09/2018 EGD: Impression:               - Normal hypopharynx.                           - Normal larynx.                           - Normal esophagus.                           - Z-line irregular, 40 cm from the incisors.                           - 2 cm hiatal hernia.                           - Normal stomach.                           - Normal duodenal bulb, second portion of the                            duodenum and third portion of the duodenum.                           - No specimens collected.                           Comment: No bleeding lesion identified on 3rd EGD.  CBC    Component Value Date/Time   WBC 7.4 03/10/2018 0506   RBC 3.17 (L) 03/10/2018 0506   HGB 11.7 (L) 03/16/2018 0937   HCT 34.2 (L) 03/16/2018 0937   PLT 178 03/10/2018 0506   MCV 98.7 03/10/2018 0506   MCH 31.2 03/10/2018 0506   MCHC 31.6 03/10/2018 0506   RDW 15.1 03/10/2018 0506   LYMPHSABS 1.0 03/06/2018 1235   MONOABS 1.0 03/06/2018 1235   EOSABS 0.3 03/06/2018 1235   BASOSABS 0.0 03/06/2018 1235        Objective:   Physical Exam Blood pressure 126/74, pulse 82, temperature 98.1 F (36.7 C), height 5\' 8"  (1.727 m), weight 126 lb 11.2 oz (57.5 kg). Alert and oriented. Skin warm and dry. Oral mucosa is moist.   . Sclera anicteric, conjunctivae is pink. Thyroid not enlarged. No cervical lymphadenopathy. Lungs clear. Heart regular rate and rhythm.  Abdomen is soft. Bowel sounds are positive. No hepatomegaly. No abdominal masses felt. No tenderness.  No edema to lower extremities. Stool card brown and guaiac negative.          Assessment & Plan:  UGIB. Guaiac negative today. Will get an H and H today. I will let Dr.  Laural Golden know.

## 2018-04-11 ENCOUNTER — Other Ambulatory Visit (INDEPENDENT_AMBULATORY_CARE_PROVIDER_SITE_OTHER): Payer: Self-pay | Admitting: Internal Medicine

## 2018-04-11 ENCOUNTER — Other Ambulatory Visit (INDEPENDENT_AMBULATORY_CARE_PROVIDER_SITE_OTHER): Payer: Self-pay | Admitting: *Deleted

## 2018-04-11 DIAGNOSIS — A048 Other specified bacterial intestinal infections: Secondary | ICD-10-CM

## 2018-04-11 DIAGNOSIS — K922 Gastrointestinal hemorrhage, unspecified: Secondary | ICD-10-CM

## 2018-04-11 MED ORDER — BIS SUBCIT-METRONID-TETRACYC 140-125-125 MG PO CAPS: 3.0000 | ORAL_CAPSULE | Freq: Three times a day (TID) | ORAL | 0 refills | Status: DC

## 2018-04-11 NOTE — Progress Notes (Signed)
Rx for Pylera sent to his pharmacy

## 2018-04-28 ENCOUNTER — Other Ambulatory Visit: Payer: Self-pay | Admitting: Internal Medicine

## 2018-04-28 NOTE — Telephone Encounter (Signed)
Last filled 04/03/2018... note to pharmacy TBF on or after 05/02/2018.Marland KitchenMarland KitchenMarland Kitchen please advise

## 2018-05-04 ENCOUNTER — Encounter (INDEPENDENT_AMBULATORY_CARE_PROVIDER_SITE_OTHER): Payer: Self-pay | Admitting: *Deleted

## 2018-05-04 ENCOUNTER — Other Ambulatory Visit (INDEPENDENT_AMBULATORY_CARE_PROVIDER_SITE_OTHER): Payer: Self-pay | Admitting: *Deleted

## 2018-05-04 DIAGNOSIS — K922 Gastrointestinal hemorrhage, unspecified: Secondary | ICD-10-CM

## 2018-05-31 ENCOUNTER — Other Ambulatory Visit: Payer: Self-pay | Admitting: Internal Medicine

## 2018-05-31 NOTE — Telephone Encounter (Signed)
See if he can do video visit follow up

## 2018-05-31 NOTE — Telephone Encounter (Signed)
Last filled 05/02/18... please advise

## 2018-06-09 DIAGNOSIS — K922 Gastrointestinal hemorrhage, unspecified: Secondary | ICD-10-CM | POA: Diagnosis not present

## 2018-06-09 LAB — HEMOGLOBIN AND HEMATOCRIT, BLOOD
HCT: 40.2 % (ref 38.5–50.0)
Hemoglobin: 13.6 g/dL (ref 13.2–17.1)

## 2018-06-12 ENCOUNTER — Other Ambulatory Visit (INDEPENDENT_AMBULATORY_CARE_PROVIDER_SITE_OTHER): Payer: Self-pay | Admitting: *Deleted

## 2018-06-12 ENCOUNTER — Other Ambulatory Visit: Payer: Self-pay | Admitting: Internal Medicine

## 2018-06-12 DIAGNOSIS — F411 Generalized anxiety disorder: Secondary | ICD-10-CM

## 2018-06-12 DIAGNOSIS — I10 Essential (primary) hypertension: Secondary | ICD-10-CM

## 2018-06-12 DIAGNOSIS — K922 Gastrointestinal hemorrhage, unspecified: Secondary | ICD-10-CM

## 2018-06-13 NOTE — Telephone Encounter (Signed)
Last filled 09/20/2017 90 days supply with 2 refills... will call pt to see if video visit can be set up

## 2018-06-29 ENCOUNTER — Other Ambulatory Visit: Payer: Self-pay | Admitting: Internal Medicine

## 2018-06-29 NOTE — Telephone Encounter (Signed)
Last filled 05/31/2018... please advise

## 2018-07-03 ENCOUNTER — Telehealth: Payer: Self-pay | Admitting: Internal Medicine

## 2018-07-03 NOTE — Telephone Encounter (Signed)
Copied from [place] [id number]. Topic: Quick Communication - Rx Refill/Question >> Jul 03, 2018  4:50 PM Sheran Luz wrote: Medication: Fluticasone-Salmeterol (ADVAIR DISKUS) 250-50 MCG/DOSE AEPB  Patient is requesting refill.   Preferred Pharmacy (with phone number or street name):[id number] - [place], [place] [place] [phone] (Phone) [phone] (Fax)

## 2018-07-04 MED ORDER — FLUTICASONE-SALMETEROL 250-50 MCG/DOSE IN AEPB: 1.0000 | INHALATION_SPRAY | Freq: Two times a day (BID) | RESPIRATORY_TRACT | 1 refills | Status: DC

## 2018-07-06 ENCOUNTER — Other Ambulatory Visit: Payer: Self-pay

## 2018-07-06 NOTE — Telephone Encounter (Signed)
This medication was refilled 3 days ago

## 2018-07-06 NOTE — Telephone Encounter (Signed)
Pt left v/m requesting status of advair refill.

## 2018-07-11 ENCOUNTER — Ambulatory Visit (INDEPENDENT_AMBULATORY_CARE_PROVIDER_SITE_OTHER): Payer: Self-pay | Admitting: Internal Medicine

## 2018-07-16 ENCOUNTER — Other Ambulatory Visit: Payer: Self-pay | Admitting: Internal Medicine

## 2018-07-17 NOTE — Telephone Encounter (Signed)
Pt left a message on Triage Line asking for this refill.

## 2018-07-28 ENCOUNTER — Other Ambulatory Visit: Payer: Self-pay | Admitting: Internal Medicine

## 2018-07-28 NOTE — Telephone Encounter (Signed)
Last filled 06/29/2018, due tomorrow... please advise

## 2018-08-07 ENCOUNTER — Other Ambulatory Visit (INDEPENDENT_AMBULATORY_CARE_PROVIDER_SITE_OTHER): Payer: Self-pay | Admitting: *Deleted

## 2018-08-07 DIAGNOSIS — K922 Gastrointestinal hemorrhage, unspecified: Secondary | ICD-10-CM

## 2018-08-25 ENCOUNTER — Other Ambulatory Visit: Payer: Self-pay | Admitting: Internal Medicine

## 2018-08-25 NOTE — Telephone Encounter (Signed)
  ALPRAZolam (XANAX) 0.5 MG tablet last refilled 07/28/18 #60 x 0 refills.   TAKE 1 TABLET(0.5 MG) BY MOUTH TWICE DAILY AS NEEDED, Normal  Dispense: 60 tablet  Refills: 0 ordered  Pharmacy: [id number] - [place], [place] [address] (Ph: [phone])  Order Details Ordered on: 07/28/18   Last OV (acute) 04/03/18 Previous AWV 08/2017 No future appts scheduled.

## 2018-09-11 DIAGNOSIS — K922 Gastrointestinal hemorrhage, unspecified: Secondary | ICD-10-CM | POA: Diagnosis not present

## 2018-09-11 LAB — HEMOGLOBIN AND HEMATOCRIT, BLOOD
HCT: 38.5 % (ref 38.5–50.0)
Hemoglobin: 13 g/dL — ABNORMAL LOW (ref 13.2–17.1)

## 2018-09-12 ENCOUNTER — Other Ambulatory Visit (INDEPENDENT_AMBULATORY_CARE_PROVIDER_SITE_OTHER): Payer: Self-pay | Admitting: *Deleted

## 2018-09-12 DIAGNOSIS — K922 Gastrointestinal hemorrhage, unspecified: Secondary | ICD-10-CM

## 2018-09-12 NOTE — Progress Notes (Signed)
HH

## 2018-09-13 ENCOUNTER — Other Ambulatory Visit: Payer: Self-pay | Admitting: Internal Medicine

## 2018-09-13 DIAGNOSIS — I10 Essential (primary) hypertension: Secondary | ICD-10-CM

## 2018-09-13 DIAGNOSIS — F411 Generalized anxiety disorder: Secondary | ICD-10-CM

## 2018-09-18 NOTE — Telephone Encounter (Signed)
Last filled 06/14/2018... please advise... try to get pt to do video call visit, or come in?

## 2018-09-19 NOTE — Telephone Encounter (Signed)
Pt left v/m requesting cb about status of refills on cholesterol, BP med, seizure med and depression med. I did not call pt back and let him know refills were sent on 09/18/18 because I was not sure if pt needed a visit in office or virtual. FYI to [facility] CMA. No future appt scheduled.

## 2018-09-19 NOTE — Telephone Encounter (Signed)
Per verbal order, pt needs to schedule in office AWV... Rx have been sent through e-scribe

## 2018-09-22 ENCOUNTER — Other Ambulatory Visit: Payer: Self-pay | Admitting: Internal Medicine

## 2018-09-25 ENCOUNTER — Other Ambulatory Visit: Payer: Self-pay | Admitting: Internal Medicine

## 2018-09-25 NOTE — Telephone Encounter (Signed)
Last filled 08/25/2018... please advise  Overdue CPE letter mailed

## 2018-09-27 ENCOUNTER — Telehealth: Payer: Self-pay | Admitting: *Deleted

## 2018-09-27 DIAGNOSIS — Z122 Encounter for screening for malignant neoplasm of respiratory organs: Secondary | ICD-10-CM

## 2018-09-27 DIAGNOSIS — Z87891 Personal history of nicotine dependence: Secondary | ICD-10-CM

## 2018-09-27 NOTE — Telephone Encounter (Signed)
Patient has been notified that annual lung cancer screening low dose CT scan is due currently or will be in near future. Confirmed that patient is within the age range of 55-77, and asymptomatic, (no signs or symptoms of lung cancer). Patient denies illness that would prevent curative treatment for lung cancer if found. Verified smoking history, (current, 47 pack year). The shared decision making visit was done 09/07/16. Patient is agreeable for CT scan being scheduled.

## 2018-10-04 ENCOUNTER — Other Ambulatory Visit: Payer: Self-pay

## 2018-10-04 ENCOUNTER — Ambulatory Visit
Admission: RE | Admit: 2018-10-04 | Discharge: 2018-10-04 | Disposition: A | Discharge status: Home / Self Care | Payer: Medicare HMO | Source: Ambulatory Visit | Attending: Oncology | Admitting: Oncology

## 2018-10-04 DIAGNOSIS — F17210 Nicotine dependence, cigarettes, uncomplicated: Secondary | ICD-10-CM | POA: Diagnosis not present

## 2018-10-04 DIAGNOSIS — Z87891 Personal history of nicotine dependence: Secondary | ICD-10-CM | POA: Insufficient documentation

## 2018-10-04 DIAGNOSIS — Z122 Encounter for screening for malignant neoplasm of respiratory organs: Secondary | ICD-10-CM | POA: Insufficient documentation

## 2018-10-06 ENCOUNTER — Encounter: Payer: Self-pay | Admitting: *Deleted

## 2018-10-25 ENCOUNTER — Other Ambulatory Visit: Payer: Self-pay | Admitting: Internal Medicine

## 2018-10-25 NOTE — Telephone Encounter (Signed)
Xanax last filled 09/25/2018... AWV scheduled for 11/2018... please advise

## 2018-11-17 ENCOUNTER — Encounter: Payer: Self-pay | Admitting: Internal Medicine

## 2018-11-17 ENCOUNTER — Other Ambulatory Visit: Payer: Self-pay

## 2018-11-17 ENCOUNTER — Ambulatory Visit (INDEPENDENT_AMBULATORY_CARE_PROVIDER_SITE_OTHER): Payer: Medicare HMO | Admitting: Internal Medicine

## 2018-11-17 VITALS — BP 126/78 | HR 66 | Temp 98.7°F | Ht 68.0 in | Wt 125.0 lb

## 2018-11-17 DIAGNOSIS — Z0000 Encounter for general adult medical examination without abnormal findings: Secondary | ICD-10-CM

## 2018-11-17 DIAGNOSIS — R7303 Prediabetes: Secondary | ICD-10-CM | POA: Diagnosis not present

## 2018-11-17 DIAGNOSIS — Z23 Encounter for immunization: Secondary | ICD-10-CM | POA: Diagnosis not present

## 2018-11-17 DIAGNOSIS — K2100 Gastro-esophageal reflux disease with esophagitis, without bleeding: Secondary | ICD-10-CM

## 2018-11-17 DIAGNOSIS — J449 Chronic obstructive pulmonary disease, unspecified: Secondary | ICD-10-CM | POA: Diagnosis not present

## 2018-11-17 DIAGNOSIS — Z125 Encounter for screening for malignant neoplasm of prostate: Secondary | ICD-10-CM

## 2018-11-17 DIAGNOSIS — G40309 Generalized idiopathic epilepsy and epileptic syndromes, not intractable, without status epilepticus: Secondary | ICD-10-CM | POA: Diagnosis not present

## 2018-11-17 DIAGNOSIS — F411 Generalized anxiety disorder: Secondary | ICD-10-CM | POA: Diagnosis not present

## 2018-11-17 DIAGNOSIS — I10 Essential (primary) hypertension: Secondary | ICD-10-CM

## 2018-11-17 DIAGNOSIS — H3530 Unspecified macular degeneration: Secondary | ICD-10-CM

## 2018-11-17 DIAGNOSIS — M1990 Unspecified osteoarthritis, unspecified site: Secondary | ICD-10-CM

## 2018-11-17 DIAGNOSIS — E782 Mixed hyperlipidemia: Secondary | ICD-10-CM

## 2018-11-17 DIAGNOSIS — E785 Hyperlipidemia, unspecified: Secondary | ICD-10-CM | POA: Diagnosis not present

## 2018-11-17 MED ORDER — BUSPIRONE HCL 5 MG PO TABS: 5.0000 mg | ORAL_TABLET | Freq: Three times a day (TID) | ORAL | 5 refills | Status: DC

## 2018-11-17 NOTE — Progress Notes (Signed)
HPI:  Patient presents today for his annual subsequent Medicare wellness exam.  He is also due to follow chronic conditions.  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  He is getting money injections by ophthalmology for therapy.  Anxiety: Deteriorated.  Triggered by family stress.  He reports his wife recently fell and broke her hip.  She is his main caregiver.  He reports his sister was recently diagnosed with breast cancer.  He is taking Citalopram as prescribed.  He sometimes has to take his Xanax up to 3 times a day.  He not currently seeing a therapist.  He denies SI/HI.  Arthritis: Mainly in his back and knees.  He takes Tylenol as needed with some relief.  COPD: Persistent shortness of breath no cough.  He is taking Wixela and Albuterol as prescribed.  He continues to smoke.  PFTs from 2015 reviewed.  Seizure disorder: No recent seizures on Dilantin.  He does not follow with neurology.  HTN: His BP today is 126/78. He is taking Losartan as prescribed.  ECG from 12/2018 reviewed.  HLD: His last LDL was 107, 08/2017.  He denies myalgias on Atorvastatin.  He does try to consume a low-fat diet.  Prediabetes: His last A1c was 5.8%, 08/2017.  He is not taking any oral diabetic medication at this time.  He does not check his sugars routinely.  Hx of GI Bleed/GERD: He reports he is taking pantoprazole as prescribed.  He is very concerned of having another bleed.  He denies any abdominal pain at this time.  He is currently not following with GI.  Past Medical History:  Diagnosis Date  . Chronic pain syndrome    Left knee  . COPD (chronic obstructive pulmonary disease) ([place])   . DDD (degenerative disc disease), lumbar 11/2008   L5-S1 on plain film  . Emphysema lung ([place])   . GAD (generalized anxiety disorder)   . GERD (gastroesophageal reflux disease)   . History of multiple pulmonary nodules 07/2010   Follow up CT scans showed resolution by 03/2011--NO MALIGNANCY.  Marland Kitchen History of  pneumonia 06/2010  . HTN (hypertension) 01/07/2014  . Osteoarthritis of left knee    + past injury of this knee--tibia fracture?  +left leg shorter than right  . Seizure ([place])   . Seizure disorder ([place])    3 total seizures (first was 1995); neuro eval done and recommended lifetime phenytoin (he had a seizure after coming off the med in the late 90s  . Tobacco dependence     Current Outpatient Medications  Medication Sig Dispense Refill  . acetaminophen (TYLENOL) 325 MG tablet Take 2 tablets (650 mg total) by mouth every 6 (six) hours as needed for mild pain or headache (or Fever >/= 101). 30 tablet 2  . albuterol (PROVENTIL HFA;VENTOLIN HFA) 108 (90 Base) MCG/ACT inhaler Inhale 1-2 puffs into the lungs every 6 (six) hours as needed for wheezing or shortness of breath. 54 g 1  . albuterol (PROVENTIL HFA;VENTOLIN HFA) 108 (90 Base) MCG/ACT inhaler Inhale 2 puffs into the lungs every 6 (six) hours as needed for wheezing or shortness of breath. 1 Inhaler 2  . ALPRAZolam (XANAX) 0.5 MG tablet TAKE 1 TABLET(0.5 MG) BY MOUTH TWICE DAILY AS NEEDED 60 tablet 0  . atorvastatin (LIPITOR) 10 MG tablet TAKE 1 TABLET EVERY DAY AT 6 PM 90 tablet 0  . bismuth-metronidazole-tetracycline (PYLERA) 140-125-125 MG capsule Take 3 capsules by mouth 4 (four) times daily -  before meals and at bedtime. [place]  capsule 0  . citalopram (CELEXA) 40 MG tablet TAKE 1 TABLET EVERY DAY 90 tablet 0  . diphenhydrAMINE (BENADRYL) 25 MG tablet Take 25 mg by mouth every 6 (six) hours as needed.    Marland Kitchen losartan (COZAAR) 50 MG tablet TAKE 1 TABLET EVERY DAY 90 tablet 0  . ondansetron (ZOFRAN) 4 MG tablet Take 1 tablet (4 mg total) by mouth every 6 (six) hours as needed for nausea or vomiting. 20 tablet 0  . pantoprazole (PROTONIX) 40 MG tablet Take 1 tablet (40 mg total) by mouth daily. 30 tablet 1  . phenytoin (DILANTIN) 100 MG ER capsule TAKE 3 CAPSULES EVERY DAY 270 capsule 0  . WIXELA INHUB 250-50 MCG/DOSE AEPB INHALE 1 PUFF INTO THE  LUNGS TWICE DAILY 60 each 1  . busPIRone (BUSPAR) 5 MG tablet Take 1 tablet (5 mg total) by mouth 3 (three) times daily. 90 tablet 5   No current facility-administered medications for this visit.     Allergies  Allergen Reactions  . Nsaids Other (See Comments)    GI bleed and vomiting    Family History  Problem Relation Age of Onset  . Heart disease Mother   . Heart disease Father   . Cancer Sister   . Stroke Neg Hx     Social History   Socioeconomic History  . Marital status: Married    Spouse name: Not on file  . Number of children: Not on file  . Years of education: Not on file  . Highest education level: Not on file  Occupational History  . Not on file  Social Needs  . Financial resource strain: Not on file  . Food insecurity    Worry: Not on file    Inability: Not on file  . Transportation needs    Medical: Not on file    Non-medical: Not on file  Tobacco Use  . Smoking status: Current Every Day Smoker    Packs/day: 1.00    Years: 45.00    Pack years: 45.00    Types: Cigarettes  . Smokeless tobacco: Never Used  Substance and Sexual Activity  . Alcohol use: No    Alcohol/week: 0.0 standard drinks  . Drug use: Not Currently    Types: Marijuana    Comment: 2 month  . Sexual activity: Yes    Partners: Male  Lifestyle  . Physical activity    Days per week: Not on file    Minutes per session: Not on file  . Stress: Not on file  Relationships  . Social Herbalist on phone: Not on file    Gets together: Not on file    Attends religious service: Not on file    Active member of club or organization: Not on file    Attends meetings of clubs or organizations: Not on file    Relationship status: Not on file  . Intimate partner violence    Fear of current or ex partner: Not on file    Emotionally abused: Not on file    Physically abused: Not on file    Forced sexual activity: Not on file  Other Topics Concern  . Not on file  Social History  Narrative   Married, 2 grown children   Eighth grade education.  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   Originally from [place].   Tobacco 100 pack-yr hx, still smoking as of 10/2011 (1 pack per day).   Denies alcohol or drug  use.   No exercise.    Hospitiliaztions: 02/2018- GI Bleed  Health Maintenance:    Flu: 02/2017  Tetanus: 08/2008  Pneumovax: 02/2018  Prevnar: 10/2014  Zostavax: Never  Shingrix: Never  PSA: 08/2017  Colon Screening: 09/2017  Eye Doctor: Every 3 months  Dental Exam: As needed   Providers:   PCP: Webb Silversmith, NP  Gastroenterologist: Dr. Vicente Males    I have personally reviewed and have noted:  1. The patient's medical and social history 2. Their use of alcohol, tobacco or illicit drugs 3. Their current medications and supplements 4. The patient's functional ability including ADL's, fall risks, home safety risks and  hearing or visual impairment. 5. Diet and physical activities 6. Evidence for depression or mood disorder  Subjective:   Review of Systems:   Constitutional: Patient reports fatigue.  Denies fever, malaise, headache or abrupt weight changes.  HEENT: Patient reports difficulty with vision.  Denies eye pain, eye redness, ear pain, ringing in the ears, wax buildup, runny nose, nasal congestion, bloody nose, or sore throat. Respiratory: Patient reports chronic cough and shortness of breath.  Denies difficulty breathing, or sputum production.   Cardiovascular: Denies chest pain, chest tightness, palpitations or swelling in the hands or feet.  Gastrointestinal: Denies abdominal pain, bloating, constipation, diarrhea or blood in the stool.  GU: Denies urgency, frequency, pain with urination, burning sensation, blood in urine, odor or discharge. Musculoskeletal: Denies decrease in range of motion, difficulty with gait, muscle pain or joint pain and swelling.  Skin: Denies redness, rashes, lesions or ulcercations.   Neurological: Denies dizziness, difficulty with memory, difficulty with speech or problems with balance and coordination.  Psych: Patient has a history of worsening anxiety.  Denies depression, SI/HI.  No other specific complaints in a complete review of systems (except as listed in HPI above).  Objective:  PE:   BP 126/78   Pulse 66   Temp 98.7 F (37.1 C) (Temporal)   Ht _0  (1.727 m)   Wt 125 lb (56.7 kg)   SpO2 98%   BMI 19.01 kg/m  Wt Readings from Last 3 Encounters:  11/17/18 125 lb (56.7 kg)  10/04/18 126 lb (57.2 kg)  04/10/18 126 lb 11.2 oz (57.5 kg)    General: Appears his stated age, well developed, well nourished in NAD. Skin: Warm, dry and intact. No rashes noted. HEENT: Head: normal shape and size; Ears: Tm's gray and intact, normal light reflex;  Neck: Neck supple, trachea midline. No masses, lumps or thyromegaly present.  Cardiovascular: Normal rate and rhythm. S1,S2 noted.  No murmur, rubs or gallops noted. No JVD or BLE edema. No carotid bruits noted. Pulmonary/Chest: Normal effort and positive vesicular breath sounds with intermittent bilateral expiratory wheezing noted. No respiratory distress. No , rales or ronchi noted.  Abdomen: Soft and nontender. Normal bowel sounds. No distention or masses noted. Liver, spleen and kidneys non palpable. Musculoskeletal:  Strength 5/5 BUE/BLE. No signs of joint swelling.  Neurological: Alert and oriented. Cranial nerves II-XII grossly intact. Coordination normal.  Psychiatric: Anxious appearing.    BMET    Component Value Date/Time   NA 138 11/17/2018 1554   K 4.4 11/17/2018 1554   CL 101 11/17/2018 1554   CO2 25 11/17/2018 1554   GLUCOSE 83 11/17/2018 1554   BUN 6 (L) 11/17/2018 1554   CREATININE 0.82 11/17/2018 1554   CALCIUM 9.5 11/17/2018 1554   GFRNONAA >60 03/10/2018 0506   GFRAA >60 03/10/2018 0506    Lipid Panel  Component Value Date/Time   CHOL 230 (H) 11/17/2018 1554   TRIG 211 (H)  11/17/2018 1554   HDL 68 11/17/2018 1554   CHOLHDL 3.4 11/17/2018 1554   VLDL 29.0 08/22/2017 1436   LDLCALC 127 (H) 11/17/2018 1554    CBC    Component Value Date/Time   WBC 8.0 11/17/2018 1554   RBC 4.55 11/17/2018 1554   HGB 14.0 11/17/2018 1554   HCT 40.9 11/17/2018 1554   PLT 224 11/17/2018 1554   MCV 89.9 11/17/2018 1554   MCH 30.8 11/17/2018 1554   MCHC 34.2 11/17/2018 1554   RDW 13.4 11/17/2018 1554   LYMPHSABS 1.0 03/06/2018 1235   MONOABS 1.0 03/06/2018 1235   EOSABS 0.3 03/06/2018 1235   BASOSABS 0.0 03/06/2018 1235    Hgb A1C Lab Results  Component Value Date   HGBA1C 5.4 11/17/2018      Assessment and Plan:   Medicare Annual Wellness Visit:  Diet: He does eat meat.  He consumes fruits and veggies when available.  He does eat some fried foods.  He drinks mostly coffee and water. Physical activity: Sedentary Depression/mood screen: Negative, PHQ 9 score of 0 Hearing: Intact to whispered voice Visual acuity: Legally blind, follows with ophthalmology ADLs: Needs assistance due to vision. Fall risk: High due to visual disturbance. Home safety: Good Cognitive evaluation: Intact to orientation, naming, recall and repetition EOL planning: No adv directives, full code/ I agree  Preventative Medicine: Flu shot today.  He declines tetanus for financial reasons.  Pneumovax UTD.  Prevnar due 2021.  He declines Zostavax or Shingrix at this time.  Colon screening up-to-date.  Encouraged him to consume a balanced diet and exercise regimen.  Advised him to see an eye doctor and dentist annually.  Will check CBC, C met, lipid, A1c and PSA today.  Due dates for screening exams given to patient as part of his AVS.   Next appointment: 6 months, follow-up chronic conditions.   Webb Silversmith, NP

## 2018-11-17 NOTE — Patient Instructions (Signed)

## 2018-11-18 LAB — LIPID PANEL
Cholesterol: 230 mg/dL — ABNORMAL HIGH (ref <200)
HDL: 68 mg/dL (ref >=40)
LDL Cholesterol (Calc): 127 mg/dL (calc) — ABNORMAL HIGH
Non-HDL Cholesterol (Calc): 162 mg/dL (calc) — ABNORMAL HIGH (ref <130)
Total CHOL/HDL Ratio: 3.4 (calc) (ref <5.0)
Triglycerides: 211 mg/dL — ABNORMAL HIGH (ref <150)

## 2018-11-18 LAB — COMPREHENSIVE METABOLIC PANEL
AG Ratio: 1.8 (calc) (ref 1.0–2.5)
ALT: 16 U/L (ref 9–46)
AST: 17 U/L (ref 10–35)
Albumin: 4.4 g/dL (ref 3.6–5.1)
Alkaline phosphatase (APISO): 66 U/L (ref 35–144)
BUN/Creatinine Ratio: 7 (calc) (ref 6–22)
BUN: 6 mg/dL — ABNORMAL LOW (ref 7–25)
CO2: 25 mmol/L (ref 20–32)
Calcium: 9.5 mg/dL (ref 8.6–10.3)
Chloride: 101 mmol/L (ref 98–110)
Creat: 0.82 mg/dL (ref 0.70–1.25)
Globulin: 2.5 g/dL (calc) (ref 1.9–3.7)
Glucose, Bld: 83 mg/dL (ref 65–99)
Potassium: 4.4 mmol/L (ref 3.5–5.3)
Sodium: 138 mmol/L (ref 135–146)
Total Bilirubin: 0.3 mg/dL (ref 0.2–1.2)
Total Protein: 6.9 g/dL (ref 6.1–8.1)

## 2018-11-18 LAB — CBC
HCT: 40.9 % (ref 38.5–50.0)
Hemoglobin: 14 g/dL (ref 13.2–17.1)
MCH: 30.8 pg (ref 27.0–33.0)
MCHC: 34.2 g/dL (ref 32.0–36.0)
MCV: 89.9 fL (ref 80.0–100.0)
MPV: 10.4 fL (ref 7.5–12.5)
Platelets: 224 10*3/uL (ref 140–400)
RBC: 4.55 10*6/uL (ref 4.20–5.80)
RDW: 13.4 % (ref 11.0–15.0)
WBC: 8 10*3/uL (ref 3.8–10.8)

## 2018-11-18 LAB — PSA: PSA: 0.9 ng/mL (ref <=4.0)

## 2018-11-18 LAB — HEMOGLOBIN A1C
Hgb A1c MFr Bld: 5.4 % of total Hgb (ref <5.7)
Mean Plasma Glucose: 108 (calc)
eAG (mmol/L): 6 (calc)

## 2018-11-18 MED ORDER — FLUTICASONE-SALMETEROL 250-50 MCG/DOSE IN AEPB: 2.0000 | INHALATION_SPRAY | Freq: Two times a day (BID) | RESPIRATORY_TRACT | 1 refills | Status: DC

## 2018-11-18 NOTE — Assessment & Plan Note (Signed)
Will need lifelong PPI Avoid NSAIDs OTC Continue Pantoprazole for now CBC and C met today

## 2018-11-18 NOTE — Assessment & Plan Note (Signed)
See today

## 2018-11-18 NOTE — Assessment & Plan Note (Signed)
Continue to follow with ophthalmology We will follow

## 2018-11-18 NOTE — Assessment & Plan Note (Signed)
C met and lipid profile today Encouraged him to consume a low-fat diet Continue Atorvastatin for now

## 2018-11-18 NOTE — Assessment & Plan Note (Signed)
Controlled on Losartan Reinforced DASH diet Will monitor

## 2018-11-18 NOTE — Assessment & Plan Note (Signed)
Increase Wixela to 2 puffs twice daily Continue Albuterol as needed Getting lung cancer screening yearly Referral to pulmonology for further evaluation and treatment

## 2018-11-18 NOTE — Assessment & Plan Note (Signed)
None on Dilantin C met today We will monitor

## 2018-11-18 NOTE — Assessment & Plan Note (Signed)
Deteriorated secondary to life stress Continue Citalopram and Xanax-advised him to not take more Xanax than prescribed as this is a breech of his controlled substance agreement Will add BuSpar 5 mg 3 times a day Update me in 1 month and let me know how you are doing Support offered today

## 2018-11-18 NOTE — Assessment & Plan Note (Signed)
History of GI bleed so should avoid all NSAIDs Continue Tylenol OTC Encouraged regular physical activity

## 2018-11-22 ENCOUNTER — Other Ambulatory Visit: Payer: Self-pay | Admitting: Internal Medicine

## 2018-11-23 ENCOUNTER — Other Ambulatory Visit: Payer: Self-pay | Admitting: Internal Medicine

## 2018-11-23 NOTE — Telephone Encounter (Signed)
Last filled 10/25/2018... please advise

## 2018-11-24 MED ORDER — ATORVASTATIN CALCIUM 20 MG PO TABS: 20.0000 mg | ORAL_TABLET | Freq: Every day | ORAL | 0 refills | Status: DC

## 2018-11-24 NOTE — Addendum Note (Signed)
Addended by: Lurlean Nanny on: 11/24/2018 12:20 PM   Modules accepted: Orders

## 2018-11-24 NOTE — Telephone Encounter (Signed)
Patient was calling to check on the refill to make sure we received it and if there were any problems. Advised patient we did receive it and just waiting on provider to review. Advised Elijah Mcfarland is not going to be in the office till this afternoon.

## 2018-12-11 ENCOUNTER — Telehealth: Payer: Self-pay

## 2018-12-11 NOTE — Telephone Encounter (Signed)
Patient called stating that he ran out of RX Wixela due to using it 2 puffs BID as suggested per Rollene Fare per patient on his last visit. RX that was sent in earlier this month had directions for 1 puff BID. Ok to re send with updated directions? Patient is out of medication.

## 2018-12-11 NOTE — Telephone Encounter (Signed)
Did it seem to help? If so, increase to 2 puffs BID on Sig and resend

## 2018-12-13 ENCOUNTER — Other Ambulatory Visit (INDEPENDENT_AMBULATORY_CARE_PROVIDER_SITE_OTHER): Payer: Self-pay | Admitting: *Deleted

## 2018-12-13 DIAGNOSIS — K922 Gastrointestinal hemorrhage, unspecified: Secondary | ICD-10-CM

## 2018-12-14 ENCOUNTER — Other Ambulatory Visit: Payer: Self-pay | Admitting: Internal Medicine

## 2018-12-14 DIAGNOSIS — F411 Generalized anxiety disorder: Secondary | ICD-10-CM

## 2018-12-14 DIAGNOSIS — I10 Essential (primary) hypertension: Secondary | ICD-10-CM

## 2018-12-15 MED ORDER — FLUTICASONE-SALMETEROL 250-50 MCG/DOSE IN AEPB: 2.0000 | INHALATION_SPRAY | Freq: Two times a day (BID) | RESPIRATORY_TRACT | 2 refills | Status: DC

## 2018-12-15 NOTE — Addendum Note (Signed)
Addended by: Lurlean Nanny on: 12/15/2018 10:57 AM   Modules accepted: Orders

## 2018-12-15 NOTE — Telephone Encounter (Signed)
Spoke to pt and he reports 2 puffs BID is better and notices less wheezing... Rx sent through e-scribe with 2 puffs BID sig

## 2018-12-18 NOTE — Telephone Encounter (Signed)
Pt left v/m that pharmacy let pt know what ins needs is Prior auth for inhaler.

## 2018-12-22 ENCOUNTER — Telehealth: Payer: Self-pay

## 2018-12-22 ENCOUNTER — Other Ambulatory Visit: Payer: Self-pay

## 2018-12-22 MED ORDER — ALPRAZOLAM 1 MG PO TABS: 0.5000 mg | ORAL_TABLET | Freq: Two times a day (BID) | ORAL | 0 refills | Status: DC | PRN

## 2018-12-22 NOTE — Telephone Encounter (Signed)
Called [facility] and they stated there is a back order on 0.5mg  Xanax has some 1mg  and 0.25mg ... Rx chagned to 1/2 tab Xanax 1mg  BID PRN TBF on or after 12/24/2018

## 2018-12-22 NOTE — Telephone Encounter (Signed)
Can we call and verify or do you know if this is on backorder? This is the first I've heard of it.

## 2018-12-22 NOTE — Telephone Encounter (Signed)
Patient called and left a message on triage line stating that pharmacy told him they do not have Xanax 0.5 mg dose and not sure when they will be able to get it in stock and asked to have 1 mg dose sent in with adjusted directions. Please review. Thank you.

## 2019-01-08 ENCOUNTER — Institutional Professional Consult (permissible substitution): Payer: Medicare HMO | Admitting: Pulmonary Disease

## 2019-01-15 DIAGNOSIS — K922 Gastrointestinal hemorrhage, unspecified: Secondary | ICD-10-CM | POA: Diagnosis not present

## 2019-01-15 LAB — HEMOGLOBIN AND HEMATOCRIT, BLOOD
HCT: 38.5 % (ref 38.5–50.0)
Hemoglobin: 13.3 g/dL (ref 13.2–17.1)

## 2019-01-20 ENCOUNTER — Other Ambulatory Visit: Payer: Self-pay | Admitting: Internal Medicine

## 2019-01-23 ENCOUNTER — Other Ambulatory Visit: Payer: Self-pay | Admitting: Internal Medicine

## 2019-01-23 NOTE — Telephone Encounter (Signed)
Last filled 12/24/2018... please advise

## 2019-02-14 ENCOUNTER — Institutional Professional Consult (permissible substitution): Payer: Medicare HMO | Admitting: Pulmonary Disease

## 2019-02-22 ENCOUNTER — Other Ambulatory Visit: Payer: Self-pay | Admitting: Internal Medicine

## 2019-02-22 NOTE — Telephone Encounter (Signed)
Last filled 01/23/2019.Marland KitchenMarland KitchenMarland Kitchen please advise

## 2019-02-24 ENCOUNTER — Other Ambulatory Visit: Payer: Self-pay | Admitting: Internal Medicine

## 2019-02-27 ENCOUNTER — Other Ambulatory Visit: Payer: Medicare HMO

## 2019-03-13 ENCOUNTER — Other Ambulatory Visit: Payer: Self-pay

## 2019-03-13 ENCOUNTER — Ambulatory Visit: Payer: Medicare HMO | Admitting: Pulmonary Disease

## 2019-03-13 ENCOUNTER — Encounter: Payer: Self-pay | Admitting: Pulmonary Disease

## 2019-03-13 VITALS — BP 128/68 | HR 80 | Temp 97.2°F | Ht 66.0 in | Wt 126.0 lb

## 2019-03-13 DIAGNOSIS — J449 Chronic obstructive pulmonary disease, unspecified: Secondary | ICD-10-CM | POA: Diagnosis not present

## 2019-03-13 DIAGNOSIS — R0601 Orthopnea: Secondary | ICD-10-CM | POA: Diagnosis not present

## 2019-03-13 DIAGNOSIS — R0602 Shortness of breath: Secondary | ICD-10-CM | POA: Diagnosis not present

## 2019-03-13 DIAGNOSIS — F17210 Nicotine dependence, cigarettes, uncomplicated: Secondary | ICD-10-CM

## 2019-03-13 MED ORDER — TRELEGY ELLIPTA 100-62.5-25 MCG/INH IN AEPB: 1.0000 | INHALATION_SPRAY | Freq: Every day | RESPIRATORY_TRACT | 0 refills | Status: DC

## 2019-03-13 MED ORDER — TRELEGY ELLIPTA 100-62.5-25 MCG/INH IN AEPB: 1.0000 | INHALATION_SPRAY | Freq: Every day | RESPIRATORY_TRACT | 6 refills | Status: DC

## 2019-03-13 NOTE — Progress Notes (Signed)
Subjective:    Patient ID: Elijah Mcfarland, male    DOB: [birth year], 69 y.o.   MRN: [id number]  HPI Patient is a 69 year old gentleman, current smoker (1 PPD), who presents for evaluation of dyspnea.  The patient states that he gets "strangled" when he sleeps on his back and has been doing this since 2010.  He notes that this is due to congestion and mucus.  He states that when he gets hot he cannot breathe.  He has been on Spiriva and generic Advair and Ventolin with varied success of control of symptoms.  For the most part he feels he is not controlled.  He has not had any fevers, chills or sweats he has had some chest discomfort particularly when he gets short of breath.  He had an upper GI bleed last year and his dyspnea got worse due to the anemia.  He is now back to his baseline sensation of dyspnea.  He notes that in 2015 after an exacerbation of COPD he was on nocturnal oxygen briefly.  He was able to be weaned off of it.  He has had a cough productive of clear sputum mostly in the mornings.  No hemoptysis.  He notes orthopnea and paroxysmal nocturnal dyspnea.  He does note also lower extremity edema towards the end of the day.  Usually resolves with raising his feet up.  Patient's past medical history, surgical history history, and family history have been reviewed and they are as noted.  Social history: He is a retired Public librarian.  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  He stayed within [place], did not see combat.  He has 2 dogs and no exotic pets.  He has been smoking for 49 years up to 3 packs of cigarettes per day over the last year has decreased to 1 pack of cigarettes per day.   Review of Systems A 10 point review of systems was performed and it is as noted above otherwise negative.    Objective:   Physical Exam BP 128/68 (BP Location: Left Arm, Cuff Size: Normal)   Pulse 80   Temp (!) 97.2 F (36.2 C) (Temporal)   Ht 5\' 6"  (1.676 m)    Wt 126 lb (57.2 kg)   SpO2 96%   BMI 20.34 kg/m  GENERAL: Thin, well-developed male, no respiratory distress, ambulatory. HEAD: Normocephalic, atraumatic.  EYES: Pupils equal, round, reactive to light.  No scleral icterus.  MOUTH: Nose/mouth/throat not examined due to masking requirements for COVID 19. NECK: Supple. No thyromegaly. No nodules. No JVD.  Trachea midline PULMONARY: Symmetrical air entry, some accessory muscle use, diffuse rhonchi and end expiratory wheezes noted. CARDIOVASCULAR: S1 and S2. Regular rate and rhythm.  No overt murmurs, gallops or rubs noted. GASTROINTESTINAL: No distention.  Benign. MUSCULOSKELETAL: No joint deformity, no clubbing, no edema.  NEUROLOGIC: No focal deficits, fluent speech, awake, alert. SKIN: Intact,warm,dry.  No rashes noted on limited exam. PSYCH: Normal mood and behavior.     Assessment & Plan:   COPD with unspecified severity or COPD type Shortness of breath/dyspnea Orthopnea Will obtain PFTs Will obtain a 2D echo to evaluate for potential cardiac causes of dyspnea Trial of Trelegy Ellipta 100/25, 1 inhalation daily Continue Wixela Use of albuterol as needed Follow-up in 2 months time, call sooner should any new difficulties arise   Tobacco dependence due to cigarettes Patient was counseled regards to discontinuation of smoking Total counseling time 3  to 5 minutes    C. Derrill Kay, MD [facility]   *This note was dictated using voice recognition software/Dragon.  Despite best efforts to proofread, errors can occur which can change the meaning.  Any change was purely unintentional.

## 2019-03-13 NOTE — Patient Instructions (Signed)
You need to stop all cigarette smoking.  I encourage you to work with your primary care provider on this.   We will switch your Wixela to Trelegy Ellipta 1 inhalation daily.  Stop taking the Wixela.  You may continue using your emergency inhaler as needed.  We have schedule breathing tests and a heart test for you.  We will see you in follow-up in 2 months time call sooner should any new difficulties arise.

## 2019-03-14 ENCOUNTER — Other Ambulatory Visit: Payer: Self-pay | Admitting: Internal Medicine

## 2019-03-14 ENCOUNTER — Telehealth: Payer: Self-pay

## 2019-03-14 DIAGNOSIS — F411 Generalized anxiety disorder: Secondary | ICD-10-CM

## 2019-03-14 DIAGNOSIS — I10 Essential (primary) hypertension: Secondary | ICD-10-CM

## 2019-03-14 NOTE — Telephone Encounter (Signed)
Pt is aware of date/time of covid test.   

## 2019-03-15 NOTE — Telephone Encounter (Signed)
Dilantin last filled 12/15/2018... please advise

## 2019-03-19 ENCOUNTER — Telehealth: Payer: Self-pay | Admitting: Pulmonary Disease

## 2019-03-19 ENCOUNTER — Other Ambulatory Visit: Admission: RE | Admit: 2019-03-19 | Payer: Medicare HMO | Source: Ambulatory Visit

## 2019-03-19 NOTE — Telephone Encounter (Signed)
Called and spoke with Pamala Hurry at Cardiop[facility] to cancel and R/S PFT. Pamala Hurry will have to get with Annia Belt to approve another date and time for patient to have PFT.  Rhonda J Cobb
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
## 2019-03-20 ENCOUNTER — Ambulatory Visit: Payer: Medicare HMO

## 2019-03-20 NOTE — Telephone Encounter (Signed)
PFT appointment R/S to Thurs 03/29/2019 at 2:15 at [facility]. Pt to arrive at 2:00. Called and spoke with patient and he is aware of appointment date, time and instructions.  Pt will need COVID test on 03/28/2019. Rhonda J Cobb

## 2019-03-23 ENCOUNTER — Other Ambulatory Visit: Payer: Self-pay | Admitting: Internal Medicine

## 2019-03-23 NOTE — Telephone Encounter (Signed)
Last filled 02/22/2019... please advise

## 2019-03-26 ENCOUNTER — Other Ambulatory Visit (INDEPENDENT_AMBULATORY_CARE_PROVIDER_SITE_OTHER): Payer: Medicare HMO

## 2019-03-26 ENCOUNTER — Other Ambulatory Visit: Payer: Self-pay

## 2019-03-26 ENCOUNTER — Telehealth: Payer: Self-pay | Admitting: Pulmonary Disease

## 2019-03-26 DIAGNOSIS — I10 Essential (primary) hypertension: Secondary | ICD-10-CM | POA: Diagnosis not present

## 2019-03-26 DIAGNOSIS — E782 Mixed hyperlipidemia: Secondary | ICD-10-CM

## 2019-03-26 LAB — LIPID PANEL
Cholesterol: 200 mg/dL (ref 0–200)
HDL: 68.1 mg/dL (ref >39.00)
LDL Cholesterol: 105 mg/dL — ABNORMAL HIGH (ref 0–99)
NonHDL: 132
Total CHOL/HDL Ratio: 3
Triglycerides: 133 mg/dL (ref 0.0–149.0)
VLDL: 26.6 mg/dL (ref 0.0–40.0)

## 2019-03-26 LAB — COMPREHENSIVE METABOLIC PANEL
ALT: 23 U/L (ref 0–53)
AST: 19 U/L (ref 0–37)
Albumin: 4.2 g/dL (ref 3.5–5.2)
Alkaline Phosphatase: 69 U/L (ref 39–117)
BUN: 13 mg/dL (ref 6–23)
CO2: 29 mEq/L (ref 19–32)
Calcium: 9.2 mg/dL (ref 8.4–10.5)
Chloride: 103 mEq/L (ref 96–112)
Creatinine, Ser: 0.81 mg/dL (ref 0.40–1.50)
GFR: 95.06 mL/min (ref >60.00)
Glucose, Bld: 90 mg/dL (ref 70–99)
Potassium: 4.6 mEq/L (ref 3.5–5.1)
Sodium: 138 mEq/L (ref 135–145)
Total Bilirubin: 0.3 mg/dL (ref 0.2–1.2)
Total Protein: 6.7 g/dL (ref 6.0–8.3)

## 2019-03-26 NOTE — Telephone Encounter (Signed)
Pt is aware of date/time of covid test and voiced his understanding.  03/28/2019 [facility]

## 2019-03-28 ENCOUNTER — Other Ambulatory Visit: Payer: Self-pay

## 2019-03-28 ENCOUNTER — Other Ambulatory Visit
Admission: RE | Admit: 2019-03-28 | Discharge: 2019-03-28 | Disposition: A | Discharge status: Home / Self Care | Payer: Medicare HMO | Source: Ambulatory Visit | Attending: Pulmonary Disease | Admitting: Pulmonary Disease

## 2019-03-28 ENCOUNTER — Ambulatory Visit
Admission: RE | Admit: 2019-03-28 | Discharge: 2019-03-28 | Disposition: A | Discharge status: Home / Self Care | Payer: Medicare HMO | Source: Ambulatory Visit | Attending: Pulmonary Disease | Admitting: Pulmonary Disease

## 2019-03-28 DIAGNOSIS — Z20822 Contact with and (suspected) exposure to covid-19: Secondary | ICD-10-CM | POA: Diagnosis not present

## 2019-03-28 DIAGNOSIS — R0602 Shortness of breath: Secondary | ICD-10-CM

## 2019-03-28 DIAGNOSIS — J449 Chronic obstructive pulmonary disease, unspecified: Secondary | ICD-10-CM | POA: Insufficient documentation

## 2019-03-28 DIAGNOSIS — I10 Essential (primary) hypertension: Secondary | ICD-10-CM | POA: Diagnosis not present

## 2019-03-28 DIAGNOSIS — F17200 Nicotine dependence, unspecified, uncomplicated: Secondary | ICD-10-CM | POA: Insufficient documentation

## 2019-03-28 LAB — SARS CORONAVIRUS 2 (TAT 6-24 HRS): SARS Coronavirus 2: NEGATIVE

## 2019-03-28 NOTE — Progress Notes (Signed)
*  PRELIMINARY RESULTS* Echocardiogram 2D Echocardiogram has been performed.  Elijah Mcfarland 03/28/2019, 11:24 AM

## 2019-03-29 ENCOUNTER — Ambulatory Visit: Payer: Medicare HMO | Attending: Pulmonary Disease

## 2019-03-29 DIAGNOSIS — J449 Chronic obstructive pulmonary disease, unspecified: Secondary | ICD-10-CM | POA: Insufficient documentation

## 2019-03-29 MED ORDER — ALBUTEROL SULFATE (2.5 MG/3ML) 0.083% IN NEBU
2.5000 mg | INHALATION_SOLUTION | Freq: Once | RESPIRATORY_TRACT | Status: AC
  Administered 2019-03-29: 14:00:00 2.5 mg via RESPIRATORY_TRACT
  Filled 2019-03-29: qty 3

## 2019-04-13 ENCOUNTER — Ambulatory Visit: Payer: Medicare HMO

## 2019-04-20 ENCOUNTER — Other Ambulatory Visit: Payer: Self-pay | Admitting: Internal Medicine

## 2019-04-20 NOTE — Telephone Encounter (Signed)
Last filled 03/23/2019... please advise

## 2019-05-11 ENCOUNTER — Ambulatory Visit: Payer: Medicare HMO | Admitting: Pulmonary Disease

## 2019-05-21 ENCOUNTER — Other Ambulatory Visit: Payer: Self-pay | Admitting: Internal Medicine

## 2019-05-21 NOTE — Telephone Encounter (Signed)
Last filled 04/20/2019...Marland Kitchen please advise

## 2019-05-23 ENCOUNTER — Other Ambulatory Visit: Payer: Self-pay | Admitting: Internal Medicine

## 2019-06-07 ENCOUNTER — Ambulatory Visit: Payer: Medicare HMO | Admitting: Pulmonary Disease

## 2019-06-07 ENCOUNTER — Encounter: Payer: Self-pay | Admitting: Pulmonary Disease

## 2019-06-07 ENCOUNTER — Other Ambulatory Visit: Payer: Self-pay

## 2019-06-07 VITALS — BP 124/64 | HR 75 | Temp 98.1°F | Ht 68.0 in | Wt 127.0 lb

## 2019-06-07 DIAGNOSIS — F17210 Nicotine dependence, cigarettes, uncomplicated: Secondary | ICD-10-CM | POA: Diagnosis not present

## 2019-06-07 DIAGNOSIS — R0601 Orthopnea: Secondary | ICD-10-CM

## 2019-06-07 DIAGNOSIS — R0602 Shortness of breath: Secondary | ICD-10-CM | POA: Diagnosis not present

## 2019-06-07 DIAGNOSIS — F419 Anxiety disorder, unspecified: Secondary | ICD-10-CM

## 2019-06-07 DIAGNOSIS — J449 Chronic obstructive pulmonary disease, unspecified: Secondary | ICD-10-CM

## 2019-06-07 MED ORDER — BREZTRI AEROSPHERE 160-9-4.8 MCG/ACT IN AERO: 2.0000 | INHALATION_SPRAY | Freq: Two times a day (BID) | RESPIRATORY_TRACT | 0 refills | Status: DC

## 2019-06-07 NOTE — Patient Instructions (Signed)
We will give you a trial of a medication called Breztri, 2 inhalations twice a day  Do not use Trelegy while using the Breztri  If the [place] works better for you please let us know so we can send a prescription to your pharmacy  We are going to check your oxygen level at nighttime  Please discuss your issues with anxiety and insomnia with your primary care provider, you have an upcoming appointment with her  We will see you in follow-up in 2 months time call sooner should you have any new issues

## 2019-06-07 NOTE — Progress Notes (Signed)
Subjective:    Patient ID: Elijah Mcfarland, male    DOB: 26-May-1950, 69 y.o.   MRN: [id number]  HPI 69 year old current smoker (1 PPD) presents for follow-up on COPD at his initial visit on 26 January we recommended starting Trelegy Ellipta.  Patient has noted some improvement in his dyspnea however continues to have issues with this.  He feels the Trelegy works well during the morning hours but towards the evening it "wears off".  He has then to use his rescue inhaler at that point.  He has severe issues with anxiety and this also makes his breathing worse.  He had a 2D echo that showed some diastolic dysfunction but overall his LVEF is preserved at 55 to 60%.  There was no valvular disease noted.  He does not have cor pulmonale.  Pulmonary function testing was performed on 29 March 2019 this showed an FEV1 of 1.17 L or 40% predicted with no bronchodilator response.  FVC was 2.68 L or 68% predicted there was some decrease in air trapping after bronchodilator.  Lung volumes showed hyperinflation and air trapping and mild to moderate degree.  Patient capacity was reduced at 41%.  Patient continues to have issues with anxiety particularly nocturnally.  He states that he gets up multiple times during the night feeling short of breath.  He only sleeps approximately 3-4 words per night.  He notes that in 2015 he was on nocturnal oxygen for a while this helped him.  This was however discontinued when he lost his insurance and could not afford the oxygen any longer.  Patient voices no other complaint today.  He has had chronic productive cough mostly in the mornings, no hemoptysis.  No fevers, chills or sweats.   Review of Systems A 10 point review of systems was performed and it is as noted above otherwise negative.      Objective:   Physical Exam BP 124/64 (BP Location: Right Arm, Cuff Size: Normal)   Pulse 75   Temp 98.1 F (36.7 C) (Oral)   Ht 5\' 8"  (1.727 m)   Wt 127 lb (57.6 kg)   SpO2  99%   BMI 19.31 kg/m   GENERAL: Thin, well-developed male, no overt respiratory distress, mild chronic use of accessories, ambulatory. HEAD: Normocephalic, atraumatic.  EYES: Pupils equal, round, reactive to light.  No scleral icterus.  MOUTH: Nose/mouth/throat not examined due to masking requirements for COVID 19. NECK: Supple. No thyromegaly. No nodules. No JVD.  Trachea midline PULMONARY: Symmetrical air entry, some accessory muscle use, diffuse end expiratory wheezes noted, no other adventitious sounds. CARDIOVASCULAR: S1 and S2. Regular rate and rhythm.  No overt murmurs, gallops or rubs noted. GASTROINTESTINAL: No distention.  Benign. MUSCULOSKELETAL: No joint deformity, no clubbing, no edema.  NEUROLOGIC: No focal deficits, fluent speech, awake, alert. SKIN: Intact,warm,dry.  No rashes noted on limited exam. 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  Derrill Kay, MD [facility]   *This note was dictated using voice recognition software/Dragon.  Despite best efforts to proofread, errors can occur which can change the meaning.  Any change was purely unintentional.

## 2019-06-08 NOTE — Addendum Note (Signed)
Addended by: Lia Foyer R on: 06/08/2019 09:00 AM   Modules accepted: Orders

## 2019-06-15 ENCOUNTER — Ambulatory Visit: Payer: Medicare HMO | Admitting: Internal Medicine

## 2019-06-19 ENCOUNTER — Other Ambulatory Visit: Payer: Self-pay | Admitting: Internal Medicine

## 2019-06-19 NOTE — Telephone Encounter (Signed)
Last filled 05/21/2019... please advise

## 2019-06-20 ENCOUNTER — Encounter: Payer: Self-pay | Admitting: Pulmonary Disease

## 2019-06-21 ENCOUNTER — Telehealth: Payer: Self-pay | Admitting: Pulmonary Disease

## 2019-06-21 DIAGNOSIS — J9611 Chronic respiratory failure with hypoxia: Secondary | ICD-10-CM

## 2019-06-22 DIAGNOSIS — J449 Chronic obstructive pulmonary disease, unspecified: Secondary | ICD-10-CM | POA: Diagnosis not present

## 2019-06-22 NOTE — Telephone Encounter (Signed)
I called and spoke with the patient and he states that the [place] does not help him as much as the Trelegy did with his breathing and wheezing. He states that he still has a Trelegy inhaler and some refills at the pharmacy. I advised him that I will make Dr. Patsey Berthold aware.

## 2019-06-22 NOTE — Telephone Encounter (Signed)
The Elijah Mcfarland was just a trial so he still has all the Trelegy prescriptions.  He can go back to Trelegy.

## 2019-06-26 NOTE — Telephone Encounter (Signed)
Called and spoke with patient.  Let him know he could go back to trelegy.  Patient states he has enough and does not need a refill. Patient asked about his oxygen test.   Dr. Patsey Berthold please advise.

## 2019-06-26 NOTE — Telephone Encounter (Signed)
LMTCB x1 for pt.  

## 2019-06-27 NOTE — Telephone Encounter (Signed)
Spoke with the pt and notified of results and recs per Dr Patsey Berthold and he verbalized understanding. Order sent to [facility].

## 2019-06-27 NOTE — Telephone Encounter (Signed)
He will need oxygen at 2 L/min.  We also should schedule a home sleep study as he does have some apparent sleep disordered breathing according to the oximetry.

## 2019-06-28 DIAGNOSIS — J449 Chronic obstructive pulmonary disease, unspecified: Secondary | ICD-10-CM | POA: Diagnosis not present

## 2019-07-06 ENCOUNTER — Ambulatory Visit (INDEPENDENT_AMBULATORY_CARE_PROVIDER_SITE_OTHER): Payer: Medicare HMO | Admitting: Internal Medicine

## 2019-07-06 ENCOUNTER — Other Ambulatory Visit: Payer: Self-pay

## 2019-07-06 ENCOUNTER — Ambulatory Visit: Payer: Medicare HMO | Admitting: Internal Medicine

## 2019-07-06 ENCOUNTER — Encounter: Payer: Self-pay | Admitting: Internal Medicine

## 2019-07-06 VITALS — BP 122/82 | HR 77 | Temp 98.3°F | Wt 127.0 lb

## 2019-07-06 DIAGNOSIS — J449 Chronic obstructive pulmonary disease, unspecified: Secondary | ICD-10-CM | POA: Diagnosis not present

## 2019-07-06 DIAGNOSIS — I10 Essential (primary) hypertension: Secondary | ICD-10-CM

## 2019-07-06 DIAGNOSIS — H3530 Unspecified macular degeneration: Secondary | ICD-10-CM | POA: Diagnosis not present

## 2019-07-06 DIAGNOSIS — G40309 Generalized idiopathic epilepsy and epileptic syndromes, not intractable, without status epilepticus: Secondary | ICD-10-CM | POA: Diagnosis not present

## 2019-07-06 DIAGNOSIS — M1990 Unspecified osteoarthritis, unspecified site: Secondary | ICD-10-CM | POA: Diagnosis not present

## 2019-07-06 DIAGNOSIS — E782 Mixed hyperlipidemia: Secondary | ICD-10-CM

## 2019-07-06 DIAGNOSIS — Z79899 Other long term (current) drug therapy: Secondary | ICD-10-CM | POA: Diagnosis not present

## 2019-07-06 DIAGNOSIS — F411 Generalized anxiety disorder: Secondary | ICD-10-CM

## 2019-07-06 DIAGNOSIS — K2100 Gastro-esophageal reflux disease with esophagitis, without bleeding: Secondary | ICD-10-CM

## 2019-07-06 DIAGNOSIS — R7303 Prediabetes: Secondary | ICD-10-CM

## 2019-07-06 MED ORDER — ALPRAZOLAM 0.5 MG PO TABS: 0.5000 mg | ORAL_TABLET | Freq: Three times a day (TID) | ORAL | 0 refills | Status: DC

## 2019-07-06 NOTE — Assessment & Plan Note (Signed)
Continue Dilantin We will check semen annually

## 2019-07-06 NOTE — Patient Instructions (Signed)
Fat and Cholesterol Restricted Eating Plan Getting too much fat and cholesterol in your diet may cause health problems. Choosing the right foods helps keep your fat and cholesterol at normal levels. This can keep you from getting certain diseases. Your doctor may recommend an eating plan that includes:  Total fat: ______% or less of total calories a day.  Saturated fat: ______% or less of total calories a day.  Cholesterol: less than _________mg a day.  Fiber: ______g a day. What are tips for following this plan? Meal planning  At meals, divide your plate into four equal parts: ? Fill one-half of your plate with vegetables and green salads. ? Fill one-fourth of your plate with whole grains. ? Fill one-fourth of your plate with low-fat (lean) protein foods.  Eat fish that is high in omega-3 fats at least two times a week. This includes mackerel, tuna, sardines, and salmon.  Eat foods that are high in fiber, such as whole grains, beans, apples, broccoli, carrots, peas, and barley. General tips   Work with your doctor to lose weight if you need to.  Avoid: ? Foods with added sugar. ? Fried foods. ? Foods with partially hydrogenated oils.  Limit alcohol intake to no more than 1 drink a day for nonpregnant women and 2 drinks a day for men. One drink equals 12 oz of beer, 5 oz of wine, or 1 oz of hard liquor. Reading food labels  Check food labels for: ? Trans fats. ? Partially hydrogenated oils. ? Saturated fat (g) in each serving. ? Cholesterol (mg) in each serving. ? Fiber (g) in each serving.  Choose foods with healthy fats, such as: ? Monounsaturated fats. ? Polyunsaturated fats. ? Omega-3 fats.  Choose grain products that have whole grains. Look for the word "whole" as the first word in the ingredient list. Cooking  Cook foods using low-fat methods. These include baking, boiling, grilling, and broiling.  Eat more home-cooked foods. Eat at restaurants and buffets  less often.  Avoid cooking using saturated fats, such as butter, cream, palm oil, palm kernel oil, and coconut oil. Recommended foods  Fruits  All fresh, canned (in natural juice), or frozen fruits. Vegetables  Fresh or frozen vegetables (raw, steamed, roasted, or grilled). Green salads. Grains  Whole grains, such as whole wheat or whole grain breads, crackers, cereals, and pasta. Unsweetened oatmeal, bulgur, barley, quinoa, or brown rice. Corn or whole wheat flour tortillas. Meats and other protein foods  Ground beef (85% or leaner), grass-fed beef, or beef trimmed of fat. Skinless chicken or turkey. Ground chicken or turkey. Pork trimmed of fat. All fish and seafood. Egg whites. Dried beans, peas, or lentils. Unsalted nuts or seeds. Unsalted canned beans. Nut butters without added sugar or oil. Dairy  Low-fat or nonfat dairy products, such as skim or 1% milk, 2% or reduced-fat cheeses, low-fat and fat-free ricotta or cottage cheese, or plain low-fat and nonfat yogurt. Fats and oils  Tub margarine without trans fats. Light or reduced-fat mayonnaise and salad dressings. Avocado. Olive, canola, sesame, or safflower oils. The items listed above may not be a complete list of foods and beverages you can eat. Contact a dietitian for more information. Foods to avoid Fruits  Canned fruit in heavy syrup. Fruit in cream or butter sauce. Fried fruit. Vegetables  Vegetables cooked in cheese, cream, or butter sauce. Fried vegetables. Grains  White bread. White pasta. White rice. Cornbread. Bagels, pastries, and croissants. Crackers and snack foods that contain trans fat   and hydrogenated oils. Meats and other protein foods  Fatty cuts of meat. Ribs, chicken wings, bacon, sausage, bologna, salami, chitterlings, fatback, hot dogs, bratwurst, and packaged lunch meats. Liver and organ meats. Whole eggs and egg yolks. Chicken and turkey with skin. Fried meat. Dairy  Whole or 2% milk, cream,  half-and-half, and cream cheese. Whole milk cheeses. Whole-fat or sweetened yogurt. Full-fat cheeses. Nondairy creamers and whipped toppings. Processed cheese, cheese spreads, and cheese curds. Beverages  Alcohol. Sugar-sweetened drinks such as sodas, lemonade, and fruit drinks. Fats and oils  Butter, stick margarine, lard, shortening, ghee, or bacon fat. Coconut, palm kernel, and palm oils. Sweets and desserts  Corn syrup, sugars, honey, and molasses. Candy. Jam and jelly. Syrup. Sweetened cereals. Cookies, pies, cakes, donuts, muffins, and ice cream. The items listed above may not be a complete list of foods and beverages you should avoid. Contact a dietitian for more information. Summary  Choosing the right foods helps keep your fat and cholesterol at normal levels. This can keep you from getting certain diseases.  At meals, fill one-half of your plate with vegetables and green salads.  Eat high-fiber foods, like whole grains, beans, apples, carrots, peas, and barley.  Limit added sugar, saturated fats, alcohol, and fried foods. This information is not intended to replace advice given to you by your health care provider. Make sure you discuss any questions you have with your health care provider. Document Revised: 10/05/2017 Document Reviewed: 10/19/2016 Elsevier Patient Education  2020 Elsevier Inc.  

## 2019-07-06 NOTE — Assessment & Plan Note (Signed)
C met and lipid profile reviewed Continue Atorvastatin Encouraged him to consume a low-fat diet

## 2019-07-06 NOTE — Assessment & Plan Note (Signed)
Continue Citalopram We will D/C BuSpar Increase Xanax to 3 times daily, refilled today Support offered CSA and UDS today

## 2019-07-06 NOTE — Assessment & Plan Note (Addendum)
Noting injections but he will continue to follow with ophthalmology

## 2019-07-06 NOTE — Assessment & Plan Note (Signed)
A1c trending down We will monitor

## 2019-07-06 NOTE — Assessment & Plan Note (Signed)
Continue Trelegy and Albuterol He will continue oxygen at night per pulmonology Encourage smoking cessation He will discuss with pulmonology about CT lung cancer screening and sleep study

## 2019-07-06 NOTE — Progress Notes (Signed)
Subjective:    Patient ID: Elijah Mcfarland, male    DOB: 1950/09/09, 69 y.o.   MRN: [id number]  HPI  Pt presents to the clinic today for 6 month follow up of chronic conditions.  Macular Degeneration: Legally blind. He no longer gets monthly injections by ophthalmology because they were not effective.  Anxiety: Persistent, managed on Citalopram, Buspar and Xanax. He does not think the Buspar was helpful. He does not see a therapist. He denies depression, SI/HI. CSA and UDS due today.  OA: Mainly in his back and knees. He takes Tylenol as needed with some relief.   COPD: Chronic SOB, but denies cough. He is taking Trelegy and Albuterol as needed. He is now wearing oxygen at night. He does continue to smoke. PFT's from 03/2013 reviewed. He follows with pulmonology.  Seizure Disorder: No recent seizures on Dilantin. He does not follow with neurology.  HTN: His BP today is 122/82. He is taking Losartan as prescribed. ECG from 12/2018 reviewed.  HLD: His last LDL was 105, 03/2019. He denies myalgias on Atorvastatin. He tries to consume a low fat diet.  Prediabetes: His last A1C was 5.4%, 11/2018. He is not taking any oral diabetic medication. He does not check his sugars.  Hx of GI Bleed/GERD: He no longer takes Pantoprazole. Upper GI from 02/2018 reviewed. He is not following with GI.  Review of Systems      Past Medical History:  Diagnosis Date  . Chronic pain syndrome    Left knee  . COPD (chronic obstructive pulmonary disease) ([place])   . DDD (degenerative disc disease), lumbar 11/2008   L5-S1 on plain film  . Emphysema lung ([place])   . GAD (generalized anxiety disorder)   . GERD (gastroesophageal reflux disease)   . History of multiple pulmonary nodules 07/2010   Follow up CT scans showed resolution by 03/2011--NO MALIGNANCY.  Marland Kitchen History of pneumonia 06/2010  . HTN (hypertension) 01/07/2014  . Osteoarthritis of left knee    + past injury of this knee--tibia fracture?  +left leg  shorter than right  . Seizure ([place])   . Seizure disorder ([place])    3 total seizures (first was 1995); neuro eval done and recommended lifetime phenytoin (he had a seizure after coming off the med in the late 90s  . Tobacco dependence     Current Outpatient Medications  Medication Sig Dispense Refill  . ALPRAZolam (XANAX) 0.5 MG tablet TAKE 1 TABLET(0.5 MG) BY MOUTH TWICE DAILY AS NEEDED 60 tablet 0  . acetaminophen (TYLENOL) 325 MG tablet Take 2 tablets (650 mg total) by mouth every 6 (six) hours as needed for mild pain or headache (or Fever >/= 101). 30 tablet 2  . albuterol (PROVENTIL HFA;VENTOLIN HFA) 108 (90 Base) MCG/ACT inhaler Inhale 2 puffs into the lungs every 6 (six) hours as needed for wheezing or shortness of breath. 1 Inhaler 2  . ALPRAZolam (XANAX) 1 MG tablet TAKE 1/2 TABLET(0.5 MG) BY MOUTH TWICE DAILY AS NEEDED FOR ANXIETY (Patient not taking: Reported on 06/07/2019) 30 tablet 0  . atorvastatin (LIPITOR) 20 MG tablet TAKE 1 TABLET EVERY DAY 90 tablet 0  . bismuth-metronidazole-tetracycline (PYLERA) 140-125-125 MG capsule Take 3 capsules by mouth 4 (four) times daily -  before meals and at bedtime. 120 capsule 0  . Budeson-Glycopyrrol-Formoterol (BREZTRI AEROSPHERE) 160-9-4.8 MCG/ACT AERO Inhale 2 puffs into the lungs in the morning and at bedtime. 5.9 g 0  . busPIRone (BUSPAR) 5 MG tablet Take 1 tablet (5 mg  total) by mouth 3 (three) times daily. (Patient not taking: Reported on 06/07/2019) 90 tablet 5  . citalopram (CELEXA) 40 MG tablet TAKE 1 TABLET EVERY DAY 90 tablet 1  . diphenhydrAMINE (BENADRYL) 25 MG tablet Take 25 mg by mouth every 6 (six) hours as needed.    . Fluticasone-Umeclidin-Vilant (TRELEGY ELLIPTA) 100-62.5-25 MCG/INH AEPB Inhale 1 puff into the lungs daily. 60 each 6  . Fluticasone-Umeclidin-Vilant (TRELEGY ELLIPTA) 100-62.5-25 MCG/INH AEPB Inhale 1 puff into the lungs daily. 60 each 0  . losartan (COZAAR) 50 MG tablet TAKE 1 TABLET EVERY DAY 90 tablet 1  .  ondansetron (ZOFRAN) 4 MG tablet Take 1 tablet (4 mg total) by mouth every 6 (six) hours as needed for nausea or vomiting. 20 tablet 0  . phenytoin (DILANTIN) 100 MG ER capsule TAKE 3 CAPSULES EVERY DAY 270 capsule 1   No current facility-administered medications for this visit.    Allergies  Allergen Reactions  . Nsaids Other (See Comments)    GI bleed and vomiting    Family History  Problem Relation Age of Onset  . Heart disease Mother   . Heart disease Father   . Cancer Sister   . Stroke Neg Hx     Social History   Socioeconomic History  . Marital status: Married    Spouse name: Not on file  . Number of children: Not on file  . Years of education: Not on file  . Highest education level: Not on file  Occupational History  . Not on file  Tobacco Use  . Smoking status: Current Every Day Smoker    Packs/day: 3.00    Years: 49.00    Pack years: 147.00    Types: Cigarettes  . Smokeless tobacco: Never Used  . Tobacco comment: currently smoking 1 pack per day--03/13/2019  Substance and Sexual Activity  . Alcohol use: No    Alcohol/week: 0.0 standard drinks  . Drug use: Not Currently    Types: Marijuana    Comment: 2 month  . Sexual activity: Yes    Partners: Male  Other Topics Concern  . Not on file  Social History Narrative   Married, 2 grown children   Eighth grade education.  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   Originally from [place].   Tobacco 100 pack-yr hx, still smoking as of 10/2011 (1 pack per day).   Denies alcohol or drug use.   No exercise.   Social Determinants of Health   Financial Resource Strain:   . Difficulty of Paying Living Expenses:   Food Insecurity:   . Worried About Charity fundraiser in the Last Year:   . Arboriculturist in the Last Year:   Transportation Needs:   . Film/video editor (Medical):   Marland Kitchen Lack of Transportation (Non-Medical):   Physical Activity:   . Days of Exercise per Week:   .  Minutes of Exercise per Session:   Stress:   . Feeling of Stress :   Social Connections:   . Frequency of Communication with Friends and Family:   . Frequency of Social Gatherings with Friends and Family:   . Attends Religious Services:   . Active Member of Clubs or Organizations:   . Attends Archivist Meetings:   Marland Kitchen Marital Status:   Intimate Partner Violence:   . Fear of Current or Ex-Partner:   . Emotionally Abused:   Marland Kitchen Physically Abused:   . Sexually Abused:  Constitutional: Denies fever, malaise, fatigue, headache or abrupt weight changes.  HEENT: Denies eye pain, eye redness, ear pain, ringing in the ears, wax buildup, runny nose, nasal congestion, bloody nose, or sore throat. Respiratory: Pt reports shortness of breath. Denies difficulty breathing, cough or sputum production.   Cardiovascular: Denies chest pain, chest tightness, palpitations or swelling in the hands or feet.  Gastrointestinal: Denies abdominal pain, bloating, constipation, diarrhea or blood in the stool.  GU: Denies urgency, frequency, pain with urination, burning sensation, blood in urine, odor or discharge. Musculoskeletal: Pt reports joint pain. Denies decrease in range of motion, difficulty with gait, muscle pain or joint swelling.  Skin: Denies redness, rashes, lesions or ulcercations.  Neurological: Denies dizziness, difficulty with memory, difficulty with speech or problems with balance and coordination.  Psych: Pt reports anxiety. Denies depression, SI/HI.  No other specific complaints in a complete review of systems (except as listed in HPI above).  Objective:   Physical Exam  BP 122/82   Pulse 77   Temp 98.3 F (36.8 C) (Temporal)   Wt 127 lb (57.6 kg)   SpO2 98%   BMI 19.31 kg/m   Wt Readings from Last 3 Encounters:  06/07/19 127 lb (57.6 kg)  03/13/19 126 lb (57.2 kg)  11/17/18 125 lb (56.7 kg)    General: Appears his stated age, chronically ill appearing, in  NAD. Skin: Warm, dry and intact. No rashes noted. HEENT: Head: normal shape and size;  Neck:  Neck supple, trachea midline. No masses, lumps or thyromegaly present.  Cardiovascular: Normal rate and rhythm. S1,S2 noted.  No murmur, rubs or gallops noted. No JVD or BLE edema.  Pulmonary/Chest: Normal effort and positive vesicular breath sounds. No respiratory distress. No wheezes, rales or ronchi noted.  Abdomen: Soft and nontender.  Musculoskeletal:  No difficulty with gait.  Neurological: Alert and oriented.  Psychiatric: Mildly anxious appearing.    BMET    Component Value Date/Time   NA 138 03/26/2019 1028   K 4.6 03/26/2019 1028   CL 103 03/26/2019 1028   CO2 29 03/26/2019 1028   GLUCOSE 90 03/26/2019 1028   BUN 13 03/26/2019 1028   CREATININE 0.81 03/26/2019 1028   CREATININE 0.82 11/17/2018 1554   CALCIUM 9.2 03/26/2019 1028   GFRNONAA >60 03/10/2018 0506   GFRAA >60 03/10/2018 0506    Lipid Panel     Component Value Date/Time   CHOL 200 03/26/2019 1028   TRIG 133.0 03/26/2019 1028   HDL 68.10 03/26/2019 1028   CHOLHDL 3 03/26/2019 1028   VLDL 26.6 03/26/2019 1028   LDLCALC 105 (H) 03/26/2019 1028   LDLCALC 127 (H) 11/17/2018 1554    CBC    Component Value Date/Time   WBC 8.0 11/17/2018 1554   RBC 4.55 11/17/2018 1554   HGB 13.3 01/15/2019 1127   HCT 38.5 01/15/2019 1127   PLT 224 11/17/2018 1554   MCV 89.9 11/17/2018 1554   MCH 30.8 11/17/2018 1554   MCHC 34.2 11/17/2018 1554   RDW 13.4 11/17/2018 1554   LYMPHSABS 1.0 03/06/2018 1235   MONOABS 1.0 03/06/2018 1235   EOSABS 0.3 03/06/2018 1235   BASOSABS 0.0 03/06/2018 1235    Hgb A1C Lab Results  Component Value Date   HGBA1C 5.4 11/17/2018            Assessment & Plan:   Webb Silversmith, NP This visit occurred during the SARS-CoV-2 public health emergency.  Safety protocols were in place, including screening questions prior to the  visit, additional usage of staff PPE, and extensive cleaning  of exam room while observing appropriate contact time as indicated for disinfecting solutions.

## 2019-07-06 NOTE — Assessment & Plan Note (Signed)
Continue Tylenol as needed

## 2019-07-06 NOTE — Assessment & Plan Note (Signed)
No issues off meds We will continue to monitor

## 2019-07-06 NOTE — Assessment & Plan Note (Signed)
Continue Losartan Kidney function reviewed We will monitor

## 2019-07-10 LAB — DRUG MONITORING, PANEL 8 WITH CONFIRMATION, URINE
6 Acetylmorphine: NEGATIVE ng/mL (ref <10)
Alcohol Metabolites: NEGATIVE ng/mL
Amphetamines: NEGATIVE ng/mL (ref <500)
Benzodiazepines: NEGATIVE ng/mL (ref <100)
Buprenorphine, Urine: NEGATIVE ng/mL (ref <5)
Cocaine Metabolite: NEGATIVE ng/mL (ref <150)
Creatinine: 42.3 mg/dL
MDMA: NEGATIVE ng/mL (ref <500)
Marijuana Metabolite: 12 ng/mL — ABNORMAL HIGH (ref <5)
Marijuana Metabolite: POSITIVE ng/mL — AB (ref <20)
Opiates: NEGATIVE ng/mL (ref <100)
Oxidant: NEGATIVE ug/mL
Oxycodone: NEGATIVE ng/mL (ref <100)
pH: 7.4 (ref 4.5–9.0)

## 2019-07-10 LAB — DM TEMPLATE

## 2019-07-29 DIAGNOSIS — J449 Chronic obstructive pulmonary disease, unspecified: Secondary | ICD-10-CM | POA: Diagnosis not present

## 2019-08-08 ENCOUNTER — Ambulatory Visit: Payer: Medicare HMO | Admitting: Pulmonary Disease

## 2019-08-08 ENCOUNTER — Other Ambulatory Visit: Payer: Self-pay

## 2019-08-08 ENCOUNTER — Encounter: Payer: Self-pay | Admitting: Pulmonary Disease

## 2019-08-08 VITALS — BP 122/70 | HR 78 | Temp 98.0°F | Ht 68.0 in | Wt 130.0 lb

## 2019-08-08 DIAGNOSIS — R0602 Shortness of breath: Secondary | ICD-10-CM

## 2019-08-08 NOTE — Progress Notes (Signed)
f °

## 2019-08-08 NOTE — Patient Instructions (Signed)
We will see back in 4 months.

## 2019-08-10 ENCOUNTER — Other Ambulatory Visit: Payer: Self-pay | Admitting: Internal Medicine

## 2019-08-11 NOTE — Telephone Encounter (Signed)
Last filled 07/06/2019... please advise

## 2019-08-11 NOTE — Telephone Encounter (Signed)
Ok to refill, can you phone in as I am currently unable to e-prescribe

## 2019-08-24 ENCOUNTER — Ambulatory Visit: Payer: Medicare HMO

## 2019-08-24 ENCOUNTER — Telehealth: Payer: Self-pay | Admitting: Pulmonary Disease

## 2019-08-24 DIAGNOSIS — G4719 Other hypersomnia: Secondary | ICD-10-CM

## 2019-08-24 NOTE — Telephone Encounter (Signed)
Split night has been ordered. Pt is aware per Suanne Marker.  Nothing further is needed.

## 2019-08-24 NOTE — Telephone Encounter (Signed)
Pt came in today to set up HST. Pt is on 02 at night.  Dr. Patsey Berthold states patient will need a Split Night instead of HST.  Please cancel HST order and place an order for a Split Night study. Rhonda J Cobb

## 2019-08-27 ENCOUNTER — Other Ambulatory Visit: Payer: Self-pay | Admitting: Internal Medicine

## 2019-08-28 DIAGNOSIS — J449 Chronic obstructive pulmonary disease, unspecified: Secondary | ICD-10-CM | POA: Diagnosis not present

## 2019-09-10 ENCOUNTER — Other Ambulatory Visit: Payer: Self-pay | Admitting: Internal Medicine

## 2019-09-10 NOTE — Telephone Encounter (Signed)
Last filled 08/13/2019 .... please advise

## 2019-09-15 ENCOUNTER — Other Ambulatory Visit: Payer: Self-pay | Admitting: Internal Medicine

## 2019-09-15 DIAGNOSIS — I10 Essential (primary) hypertension: Secondary | ICD-10-CM

## 2019-09-15 DIAGNOSIS — F411 Generalized anxiety disorder: Secondary | ICD-10-CM

## 2019-09-24 ENCOUNTER — Other Ambulatory Visit: Payer: Self-pay | Admitting: *Deleted

## 2019-09-24 ENCOUNTER — Telehealth: Payer: Self-pay | Admitting: *Deleted

## 2019-09-24 DIAGNOSIS — Z122 Encounter for screening for malignant neoplasm of respiratory organs: Secondary | ICD-10-CM

## 2019-09-24 DIAGNOSIS — Z87891 Personal history of nicotine dependence: Secondary | ICD-10-CM

## 2019-09-24 NOTE — Telephone Encounter (Signed)
Writer spoke with patient on this date to discuss patient's annual lung cancer CT screening scan. Patient stated that he still smokes 1 ppd, has not had any health issues within the last 12 months, is not being followed for any cancer treatments, and had his COVID vaccine in April 2021. CT scan has been scheduled for 10-10-19 at 11:15 and patient was informed of appt date/time.

## 2019-09-28 DIAGNOSIS — J449 Chronic obstructive pulmonary disease, unspecified: Secondary | ICD-10-CM | POA: Diagnosis not present

## 2019-10-10 ENCOUNTER — Other Ambulatory Visit: Payer: Self-pay

## 2019-10-10 ENCOUNTER — Ambulatory Visit
Admission: RE | Admit: 2019-10-10 | Discharge: 2019-10-10 | Disposition: A | Discharge status: Home / Self Care | Payer: Medicare HMO | Source: Ambulatory Visit | Attending: Nurse Practitioner | Admitting: Nurse Practitioner

## 2019-10-10 DIAGNOSIS — Z87891 Personal history of nicotine dependence: Secondary | ICD-10-CM | POA: Diagnosis not present

## 2019-10-10 DIAGNOSIS — Z122 Encounter for screening for malignant neoplasm of respiratory organs: Secondary | ICD-10-CM | POA: Insufficient documentation

## 2019-10-10 DIAGNOSIS — F17210 Nicotine dependence, cigarettes, uncomplicated: Secondary | ICD-10-CM | POA: Diagnosis not present

## 2019-10-11 ENCOUNTER — Ambulatory Visit: Payer: Medicare HMO | Attending: Pulmonary Disease

## 2019-10-11 ENCOUNTER — Other Ambulatory Visit: Payer: Self-pay | Admitting: Internal Medicine

## 2019-10-11 ENCOUNTER — Encounter: Payer: Self-pay | Admitting: Pulmonary Disease

## 2019-10-11 DIAGNOSIS — G4710 Hypersomnia, unspecified: Secondary | ICD-10-CM | POA: Insufficient documentation

## 2019-10-11 DIAGNOSIS — G4733 Obstructive sleep apnea (adult) (pediatric): Secondary | ICD-10-CM | POA: Diagnosis not present

## 2019-10-11 DIAGNOSIS — G40409 Other generalized epilepsy and epileptic syndromes, not intractable, without status epilepticus: Secondary | ICD-10-CM | POA: Diagnosis not present

## 2019-10-11 DIAGNOSIS — I10 Essential (primary) hypertension: Secondary | ICD-10-CM | POA: Diagnosis not present

## 2019-10-11 NOTE — Telephone Encounter (Signed)
Last filled 09/11/2019... please advise

## 2019-10-13 ENCOUNTER — Encounter: Payer: Self-pay | Admitting: *Deleted

## 2019-10-15 ENCOUNTER — Other Ambulatory Visit: Payer: Self-pay

## 2019-10-29 DIAGNOSIS — J449 Chronic obstructive pulmonary disease, unspecified: Secondary | ICD-10-CM | POA: Diagnosis not present

## 2019-10-31 ENCOUNTER — Telehealth (INDEPENDENT_AMBULATORY_CARE_PROVIDER_SITE_OTHER): Payer: Medicare HMO | Admitting: Pulmonary Disease

## 2019-10-31 DIAGNOSIS — G4733 Obstructive sleep apnea (adult) (pediatric): Secondary | ICD-10-CM | POA: Diagnosis not present

## 2019-10-31 NOTE — Addendum Note (Signed)
Addended by: Claudette Head A on: 10/31/2019 11:47 AM   Modules accepted: Orders

## 2019-10-31 NOTE — Telephone Encounter (Signed)
Patient is aware of below message and voiced his understanding. Order has been placed to [facility] for ONO and CPAP therapy. Nothing further is needed at this time.

## 2019-10-31 NOTE — Telephone Encounter (Signed)
Patient is aware of results and voiced his understanding.  Patient would like to proceed with cpap. He is questioning if he should continue to wear 2L QHS. He will call back to schedule compliance OV once setup on cpap.   Dr. Patsey Berthold, please advise on oxygen? Thanks

## 2019-10-31 NOTE — Telephone Encounter (Signed)
Elijah Mcfarland has moderate to severe sleep apnea.  Recommend auto CPAP titration CPAP 5-20 cmH2O with download in 2 weeks.

## 2019-10-31 NOTE — Telephone Encounter (Signed)
Sorry for the delayed report Mod -severe OSA on the study CPAP ttiration vs autoCPAP depending on dr Patsey Berthold preference

## 2019-10-31 NOTE — Telephone Encounter (Signed)
We will get oximetry on the CPAP without oxygen first and then determine if he needs additional oxygen bled in.

## 2019-11-12 ENCOUNTER — Other Ambulatory Visit: Payer: Self-pay | Admitting: Internal Medicine

## 2019-11-13 ENCOUNTER — Other Ambulatory Visit: Payer: Self-pay | Admitting: Pulmonary Disease

## 2019-11-13 NOTE — Telephone Encounter (Signed)
Last filled 10/11/2019...Marland Kitchen please advise

## 2019-11-16 ENCOUNTER — Telehealth: Payer: Self-pay | Admitting: Pulmonary Disease

## 2019-11-16 NOTE — Telephone Encounter (Signed)
Received denial form from [facility] for tier exemption on Trelegy. This was started by patient. I have spoken to [place] with [facility], who stated that all inhalers are tier 3.  trelegy co pay is 156 dollars.  I have spoken to patient who stated Trelegy is not affordable for him. Patient is aware that we can attempt to apply for patient assistance with [employer]. Patient requested that forms be mailed to his address on file.  Forms have been placed in outgoing mail.  He will bring completed forms back to our office.  Nothing further needed.

## 2019-11-21 ENCOUNTER — Other Ambulatory Visit: Payer: Self-pay | Admitting: Internal Medicine

## 2019-11-28 DIAGNOSIS — J449 Chronic obstructive pulmonary disease, unspecified: Secondary | ICD-10-CM | POA: Diagnosis not present

## 2019-12-11 ENCOUNTER — Other Ambulatory Visit: Payer: Self-pay | Admitting: Internal Medicine

## 2019-12-11 DIAGNOSIS — I10 Essential (primary) hypertension: Secondary | ICD-10-CM

## 2019-12-11 DIAGNOSIS — F411 Generalized anxiety disorder: Secondary | ICD-10-CM

## 2019-12-11 NOTE — Telephone Encounter (Signed)
Last filled 11/13/2019...Marland Kitchen please advise

## 2019-12-25 ENCOUNTER — Encounter: Payer: Self-pay | Admitting: Pulmonary Disease

## 2019-12-25 ENCOUNTER — Other Ambulatory Visit: Payer: Self-pay

## 2019-12-25 ENCOUNTER — Ambulatory Visit: Payer: Medicare HMO | Admitting: Pulmonary Disease

## 2019-12-25 VITALS — BP 122/68 | HR 76 | Temp 98.2°F | Ht 68.0 in | Wt 130.6 lb

## 2019-12-25 DIAGNOSIS — J449 Chronic obstructive pulmonary disease, unspecified: Secondary | ICD-10-CM | POA: Diagnosis not present

## 2019-12-25 DIAGNOSIS — J441 Chronic obstructive pulmonary disease with (acute) exacerbation: Secondary | ICD-10-CM

## 2019-12-25 DIAGNOSIS — R0602 Shortness of breath: Secondary | ICD-10-CM

## 2019-12-25 DIAGNOSIS — F17210 Nicotine dependence, cigarettes, uncomplicated: Secondary | ICD-10-CM | POA: Diagnosis not present

## 2019-12-25 DIAGNOSIS — G4733 Obstructive sleep apnea (adult) (pediatric): Secondary | ICD-10-CM

## 2019-12-25 DIAGNOSIS — G4736 Sleep related hypoventilation in conditions classified elsewhere: Secondary | ICD-10-CM

## 2019-12-25 DIAGNOSIS — Z9989 Dependence on other enabling machines and devices: Secondary | ICD-10-CM | POA: Diagnosis not present

## 2019-12-25 MED ORDER — PREDNISONE 10 MG (21) PO TBPK: ORAL_TABLET | ORAL | 0 refills | Status: DC

## 2019-12-25 MED ORDER — TRELEGY ELLIPTA 200-62.5-25 MCG/INH IN AEPB: 1.0000 | INHALATION_SPRAY | Freq: Every day | RESPIRATORY_TRACT | 0 refills | Status: DC

## 2019-12-25 NOTE — Patient Instructions (Addendum)
Continue using your oxygen at nighttime.  Work on stopping smoking.  You have received samples of Trelegy.  We have also sent to your pharmacy a taper of prednisone.  In follow-up in 3 months time call sooner should any new problems arise.

## 2019-12-25 NOTE — Progress Notes (Signed)
Subjective:    Patient ID: Elijah Mcfarland, male    DOB: [birth year], 69 y.o.   MRN: [id number]  HPI Patient is a 69 year old current smoker (1 PPD) who follows on COPD GOLD stage III.  Last seen here on 07 June 2019.  During that visit he was given a trial of Breztri as he did not feel that Trelegy lasted through the end of of the day.  After a trial of Breztri he decided to go back on Trelegy and now feels that this is helping him.  In the interim also he has been noted to need nocturnal oxygen.  A sleep study was also obtained moderate to severe OSA and he is on auto CPAP.  He has been compliant he has no overt complaint today except that he has had some increase shortness of breath over the last 2 to 3 days with cough productive of yellowish sputum.  No fevers, chills or sweats.  No chest pain.  DATA: 03/28/2019 2D echo: LVEF 55 to 60%, grade 1 DD no evidence of right-sided dysfunction 03/29/2019 PFT: FEV1 1.17 L or 40% predicted, FVC 2.68 L or 68% predicted, no bronchodilator response.  FEV1/FVC 44% there is hyperinflation and air trapping capacity is severely impaired at 44% 10/11/2019 sleep study: AHI 71.8, moderate to severe sleep apnea.  On auto CPAP 5 to 20 cm H2O  Review of Systems A 10 point review of systems was performed and it is as noted above otherwise negative.  Patient Active Problem List   Diagnosis Date Noted  . History of colonic polyps   . GERD (gastroesophageal reflux disease) 03/01/2018  . Macular degeneration 08/22/2017  . HTN (hypertension) 01/07/2014  . Prediabetes 04/13/2013  . 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 Anxiety state 04/25/2006  . TOBACCO ABUSE 04/25/2006  . 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 COPD (chronic obstructive pulmonary disease) ([place]) 04/25/2006  . Arthritis 04/25/2006   Social History   Tobacco Use  . Smoking status: Current Every Day Smoker    Packs/day: 3.00    Years: 51.00    Pack years: 153.00    Types:  Cigarettes  . Smokeless tobacco: Never Used  . Tobacco comment: 1 ppd/  Substance Use Topics  . Alcohol use: No    Alcohol/week: 0.0 standard drinks   Allergies  Allergen Reactions  . Nsaids Other (See Comments)    GI bleed and vomiting   Medications reviewed.  Immunization History  Administered Date(s) Administered  . Fluad Quad(high Dose 65+) 11/17/2018,   . H1N1 01/05/2008  . Influenza Whole 11/07/2007  . Influenza, High Dose Seasonal PF 03/03/2018  . Influenza, Seasonal, Injecte, Preservative Fre 04/13/2013  . Influenza,inj,Quad PF,6+ Mos 11/14/2014, 02/19/2016, 02/18/2017  . Moderna SARS-COV2 Booster Vaccination 12/26/2019  . Moderna Sars-Covid-2 Vaccination 04/22/2019, 05/20/2019  . Pneumococcal Conjugate-13 11/14/2014, 02/05/2020  . Pneumococcal Polysaccharide-23 11/07/2007, 02/19/2016, 03/03/2018  . Td 09/06/2008      Objective:   Physical Exam BP 122/68 (BP Location: Left Arm, Patient Position: Sitting, Cuff Size: Normal)   Pulse 76   Temp 98.2 F (36.8 C) (Temporal)   Ht 5\' 8"  (1.727 m)   Wt 130 lb 9.6 oz (59.2 kg)   SpO2 97%   BMI 19.86 kg/m  GENERAL: Thin, well-developed male, no overt respiratory distress, mild chronic use of accessories, ambulatory. HEAD: Normocephalic, atraumatic.  EYES: Pupils equal, round, reactive to light. No scleral icterus.  MOUTH: Nose/mouth/throat not examined due to masking requirements for COVID 19. NECK: Supple. No thyromegaly.  No nodules. No JVD. Trachea midline PULMONARY: Symmetrical air entry, some accessory muscle use, diffuse end expiratory wheezes noted, no other adventitious sounds. CARDIOVASCULAR: S1 and S2. Regular rate and rhythm. No overt murmurs, gallops or rubs noted. GASTROINTESTINAL: No distention. Benign. MUSCULOSKELETAL: No joint deformity, no clubbing, no edema.  NEUROLOGIC: No focal deficits, fluent speech, awake, alert. SKIN: Intact,warm,dry. No rashes noted on limited exam. PSYCH: Normal mood and  behavior     Assessment & Plan:     ICD-10-CM   1. Stage 3 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 ([place])  J44.9    Continue Trelegy Ellipta and as needed albuterol  2. COPD with acute exacerbation (HCC)  J44.1    Prednisone taper No evidence of active infection Antibiotics not warranted  3. OSA on CPAP  G47.33    Z99.89    Patient has moderate to severe sleep apnea AHI 71.8 Continue auto CPAP 5 - 20 cm H2O No need for supplemental O2 bled in  4. Tobacco dependence due to cigarettes  F17.210    Patient was counseled regards discontinuation of smoking Total counseling time 3 to 5 minutes   Meds ordered this encounter  Medications  .  predniSONE (STERAPRED UNI-PAK 21 TAB) 10 MG (21) TBPK tablet    Sig: Take as directed in the package.    Dispense:  1 each    Refill:  0  . Fluticasone-Umeclidin-Vilant (TRELEGY ELLIPTA) 200-62.5-25 MCG/INH AEPB    Sig: Inhale 1 puff into the lungs daily.    Dispense:  60 each    Refill:  0    Order Specific Question:   Lot Number?    Answer:   [id number]    Order Specific Question:   Expiration Date?    Answer:   11/15/2020    Order Specific Question:   Manufacturer?    Answer:   [employer] [[id number]]    Order Specific Question:   Quantity    Answer:   2    We will see the patient in follow-up in 3 months time he is to contact us prior to that time should any new difficulties arise.  Renold Don, MD [facility]   *This note was dictated using voice recognition software/Dragon.  Despite best efforts to proofread, errors can occur which can change the meaning.  Any change was purely unintentional.

## 2019-12-29 DIAGNOSIS — J449 Chronic obstructive pulmonary disease, unspecified: Secondary | ICD-10-CM | POA: Diagnosis not present

## 2020-01-14 ENCOUNTER — Other Ambulatory Visit: Payer: Self-pay | Admitting: Internal Medicine

## 2020-01-14 NOTE — Telephone Encounter (Signed)
Last filled 12/12/2019...Marland Kitchen please advise

## 2020-01-16 ENCOUNTER — Encounter: Payer: Medicare HMO | Admitting: Internal Medicine

## 2020-01-28 DIAGNOSIS — J449 Chronic obstructive pulmonary disease, unspecified: Secondary | ICD-10-CM | POA: Diagnosis not present

## 2020-02-05 ENCOUNTER — Ambulatory Visit (INDEPENDENT_AMBULATORY_CARE_PROVIDER_SITE_OTHER)
Admission: RE | Admit: 2020-02-05 | Discharge: 2020-02-05 | Disposition: A | Discharge status: Home / Self Care | Payer: Medicare HMO | Source: Ambulatory Visit | Attending: Internal Medicine | Admitting: Internal Medicine

## 2020-02-05 ENCOUNTER — Ambulatory Visit (INDEPENDENT_AMBULATORY_CARE_PROVIDER_SITE_OTHER): Payer: Medicare HMO | Admitting: Internal Medicine

## 2020-02-05 ENCOUNTER — Other Ambulatory Visit: Payer: Self-pay

## 2020-02-05 VITALS — BP 130/70 | HR 83 | Temp 97.6°F | Ht 68.0 in | Wt 110.8 lb

## 2020-02-05 DIAGNOSIS — Z125 Encounter for screening for malignant neoplasm of prostate: Secondary | ICD-10-CM

## 2020-02-05 DIAGNOSIS — M25511 Pain in right shoulder: Secondary | ICD-10-CM

## 2020-02-05 DIAGNOSIS — Z23 Encounter for immunization: Secondary | ICD-10-CM | POA: Diagnosis not present

## 2020-02-05 DIAGNOSIS — J449 Chronic obstructive pulmonary disease, unspecified: Secondary | ICD-10-CM

## 2020-02-05 DIAGNOSIS — K2100 Gastro-esophageal reflux disease with esophagitis, without bleeding: Secondary | ICD-10-CM

## 2020-02-05 DIAGNOSIS — L989 Disorder of the skin and subcutaneous tissue, unspecified: Secondary | ICD-10-CM

## 2020-02-05 DIAGNOSIS — H3530 Unspecified macular degeneration: Secondary | ICD-10-CM

## 2020-02-05 DIAGNOSIS — Z0000 Encounter for general adult medical examination without abnormal findings: Secondary | ICD-10-CM | POA: Diagnosis not present

## 2020-02-05 DIAGNOSIS — Z1211 Encounter for screening for malignant neoplasm of colon: Secondary | ICD-10-CM

## 2020-02-05 DIAGNOSIS — I10 Essential (primary) hypertension: Secondary | ICD-10-CM

## 2020-02-05 DIAGNOSIS — F411 Generalized anxiety disorder: Secondary | ICD-10-CM

## 2020-02-05 DIAGNOSIS — M1990 Unspecified osteoarthritis, unspecified site: Secondary | ICD-10-CM

## 2020-02-05 DIAGNOSIS — G40309 Generalized idiopathic epilepsy and epileptic syndromes, not intractable, without status epilepticus: Secondary | ICD-10-CM

## 2020-02-05 DIAGNOSIS — R7303 Prediabetes: Secondary | ICD-10-CM

## 2020-02-05 DIAGNOSIS — R634 Abnormal weight loss: Secondary | ICD-10-CM

## 2020-02-05 LAB — LIPID PANEL
Cholesterol: 182 mg/dL (ref 0–200)
HDL: 62.2 mg/dL (ref >39.00)
LDL Cholesterol: 94 mg/dL (ref 0–99)
NonHDL: 120.09
Total CHOL/HDL Ratio: 3
Triglycerides: 130 mg/dL (ref 0.0–149.0)
VLDL: 26 mg/dL (ref 0.0–40.0)

## 2020-02-05 LAB — COMPREHENSIVE METABOLIC PANEL
ALT: 23 U/L (ref 0–53)
AST: 21 U/L (ref 0–37)
Albumin: 4.7 g/dL (ref 3.5–5.2)
Alkaline Phosphatase: 66 U/L (ref 39–117)
BUN: 11 mg/dL (ref 6–23)
CO2: 31 mEq/L (ref 19–32)
Calcium: 9.6 mg/dL (ref 8.4–10.5)
Chloride: 102 mEq/L (ref 96–112)
Creatinine, Ser: 0.8 mg/dL (ref 0.40–1.50)
GFR: 91.37 mL/min (ref >60.00)
Glucose, Bld: 78 mg/dL (ref 70–99)
Potassium: 4.4 mEq/L (ref 3.5–5.1)
Sodium: 138 mEq/L (ref 135–145)
Total Bilirubin: 0.4 mg/dL (ref 0.2–1.2)
Total Protein: 7.4 g/dL (ref 6.0–8.3)

## 2020-02-05 LAB — CBC
HCT: 42 % (ref 39.0–52.0)
Hemoglobin: 13.9 g/dL (ref 13.0–17.0)
MCHC: 33.1 g/dL (ref 30.0–36.0)
MCV: 93.2 fl (ref 78.0–100.0)
Platelets: 222 10*3/uL (ref 150.0–400.0)
RBC: 4.51 Mil/uL (ref 4.22–5.81)
RDW: 14 % (ref 11.5–15.5)
WBC: 9.7 10*3/uL (ref 4.0–10.5)

## 2020-02-05 LAB — PSA, MEDICARE: PSA: 0.92 ng/ml (ref 0.10–4.00)

## 2020-02-05 LAB — TSH: TSH: 3.11 u[IU]/mL (ref 0.35–4.50)

## 2020-02-05 LAB — HEMOGLOBIN A1C: Hgb A1c MFr Bld: 5.6 % (ref 4.6–6.5)

## 2020-02-05 IMAGING — CT CT CHEST LUNG CANCER SCREENING LOW DOSE W/O CM
2 of 5 series · 15 of 40 positions shown, 18 images · non-contrast
Comparison: None.

CLINICAL DATA: 67-year-old male current smoker, with 46 pack-year
history of smoking, for initial lung cancer screening

EXAM:
CT CHEST WITHOUT CONTRAST LOW-DOSE FOR LUNG CANCER SCREENING
TECHNIQUE: Multidetector CT imaging of the chest was performed following the
standard protocol without IV contrast.

[Series 3: lung · axial · 0.64mm/px · z∈[-1299,-974]mm · 12 of 363 slices shown, 15 images (1 of 2)]
[im 19/363  mediastinal]
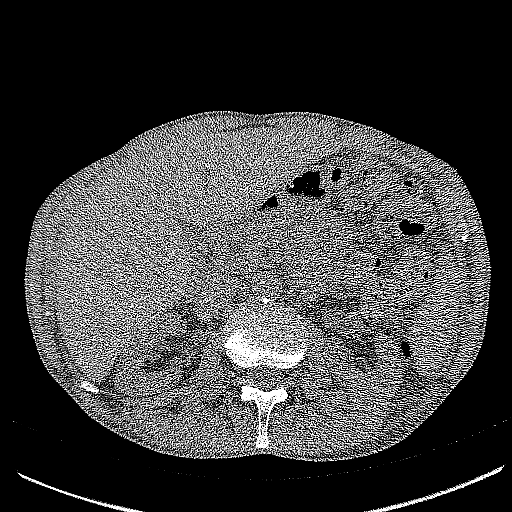
[im 19/363  lung]
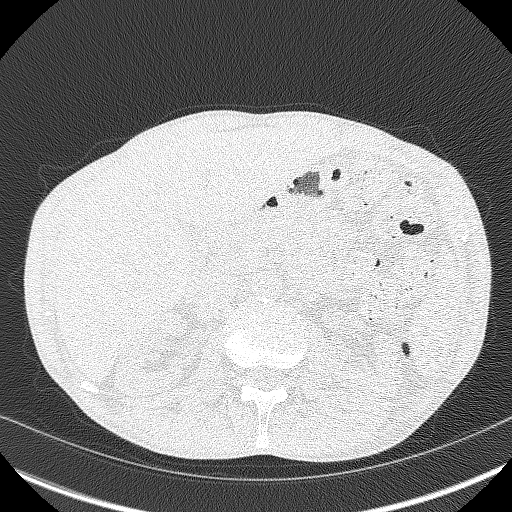
[im 55/363  lung]
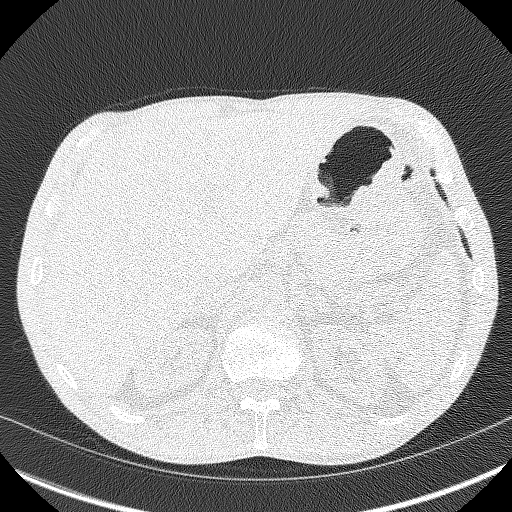
[im 73/363  lung]
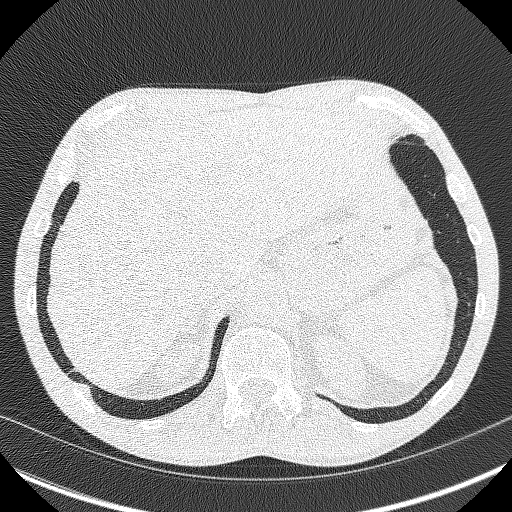
[im 109/363  lung]
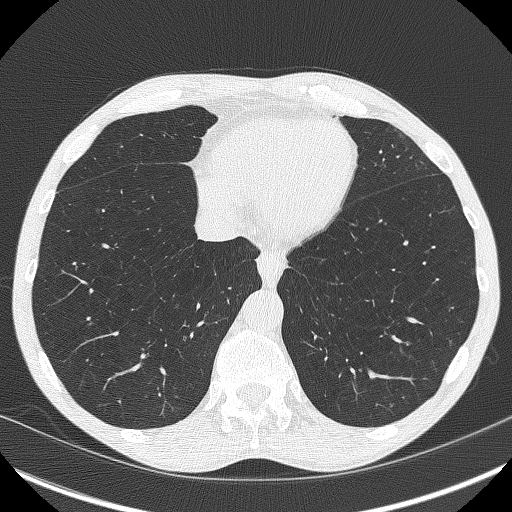
[im 145/363  mediastinal]
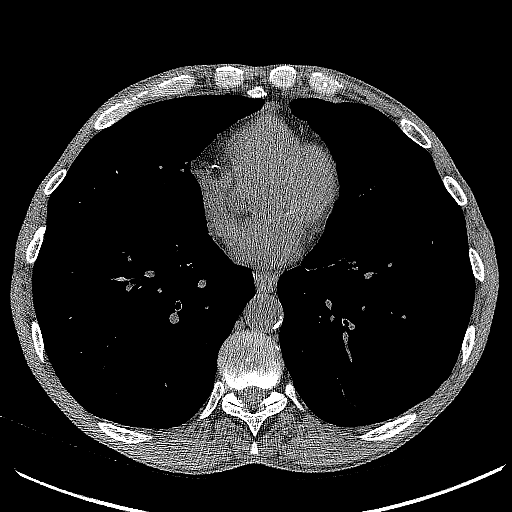
[im 145/363  lung]
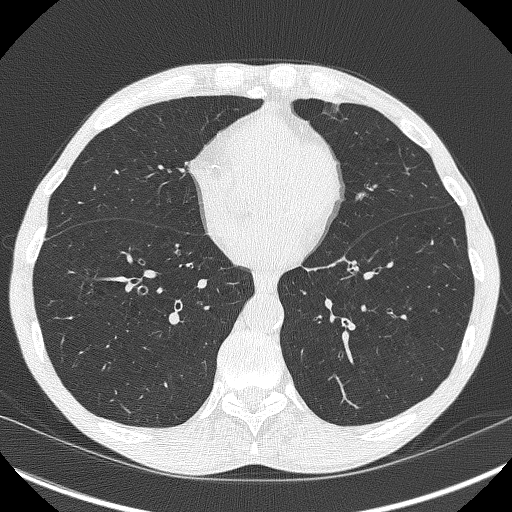
[im 163/363  lung]
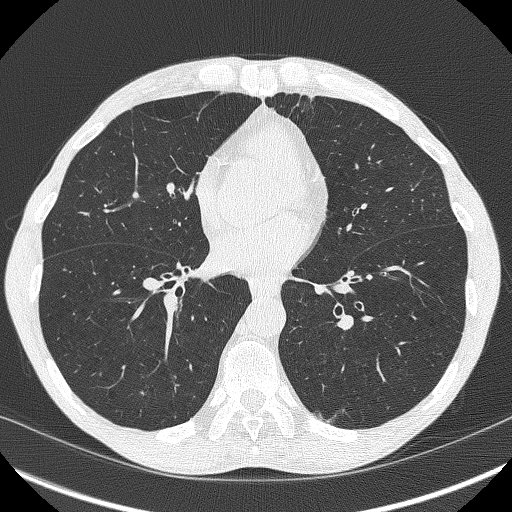
[im 200/363  lung]
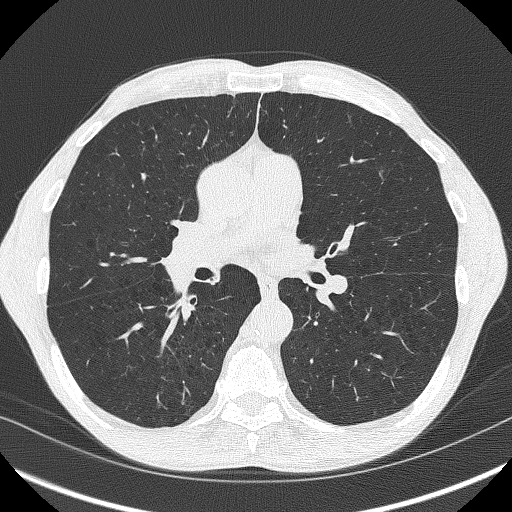
[im 218/363  lung]
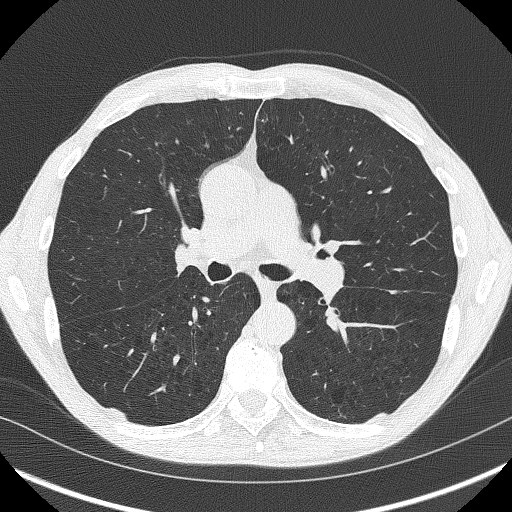
[im 254/363  mediastinal]
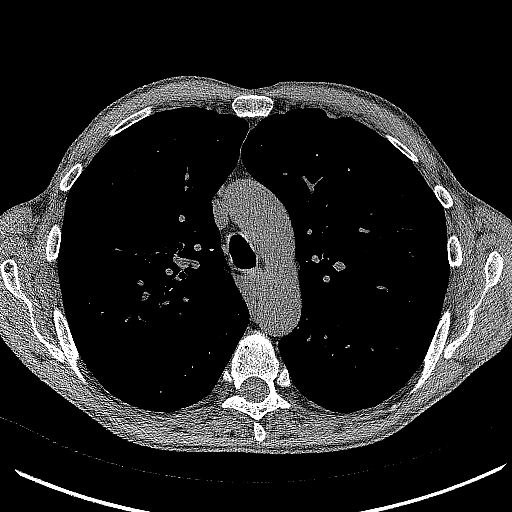
[im 254/363  lung]
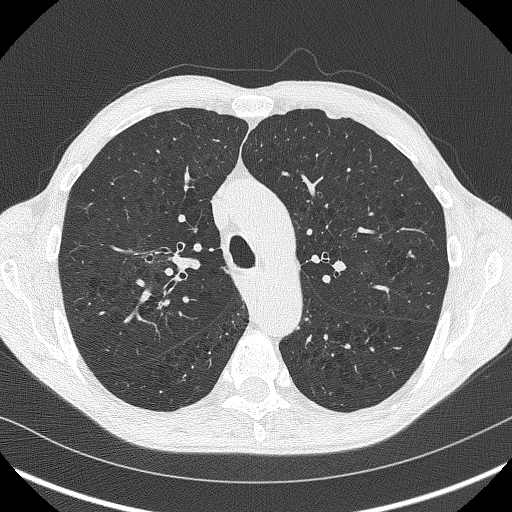
[im 290/363  lung]
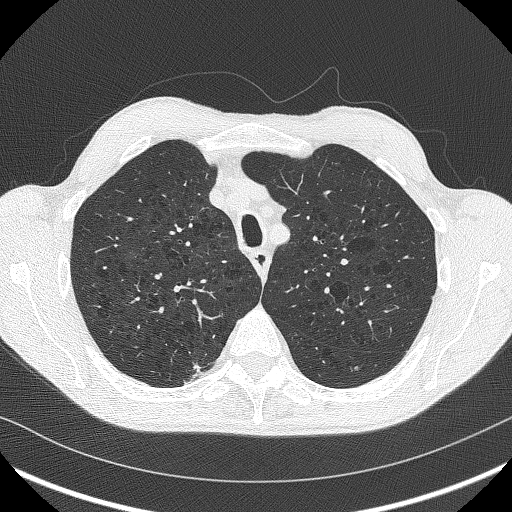
[im 308/363  lung]
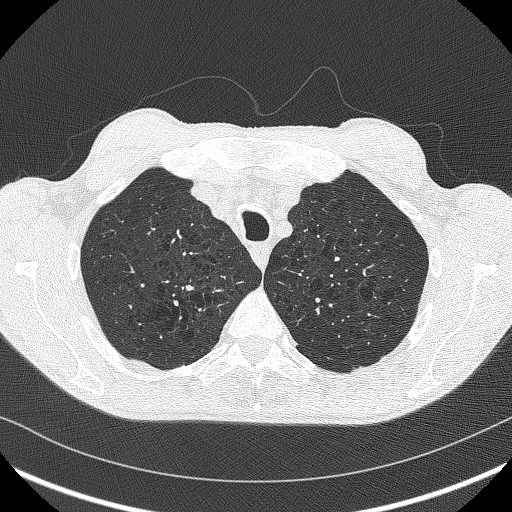
[im 344/363  lung]
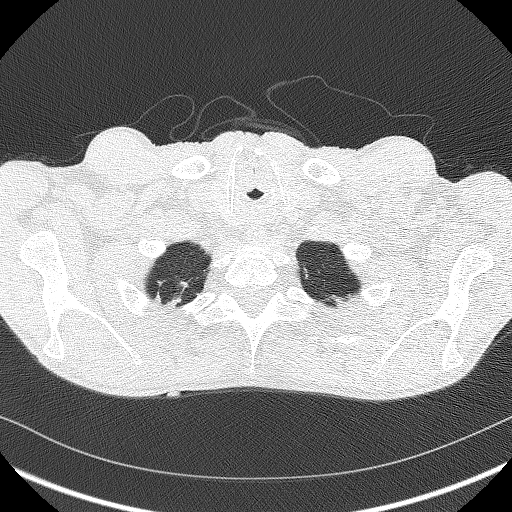

[Series 4: lung · coronal · 0.64mm/px · 3 of 270 slices shown (2 of 2)]
[im 54/270  lung]
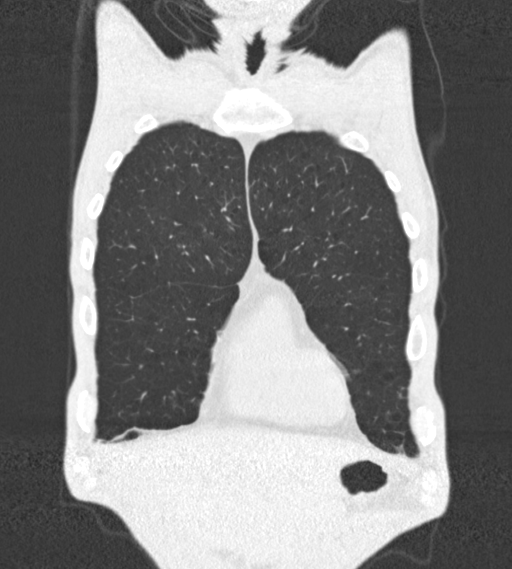
[im 108/270  lung]
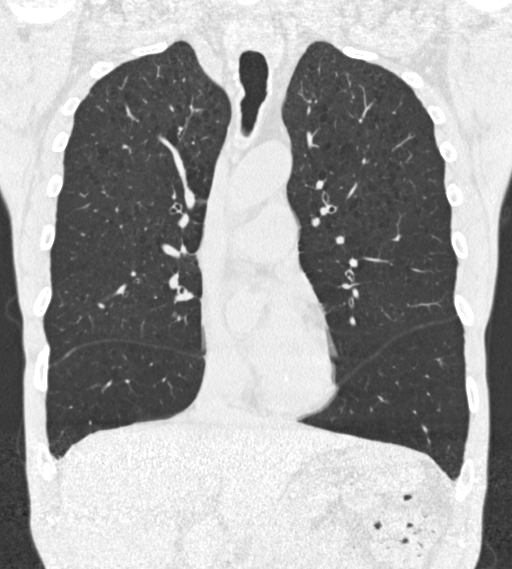
[im 162/270  lung]
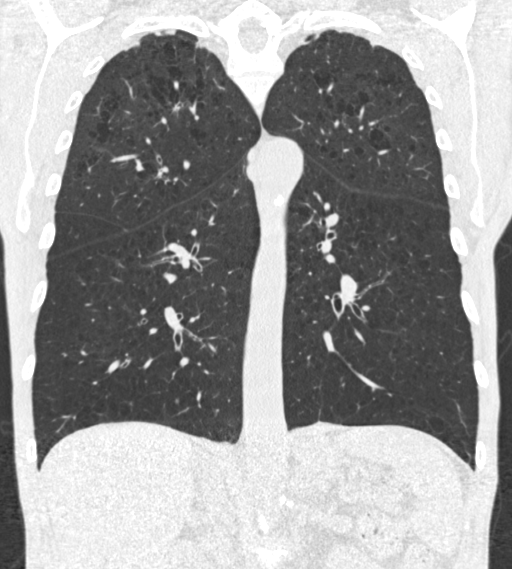

[15 of 40 positions shown; findings below may reference images not displayed]

FINDINGS: Cardiovascular: Heart is normal in size.  No pericardial effusion.

No evidence of thoracic aortic aneurysm. Atherosclerotic
calcifications of the aortic arch.

Mild coronary atherosclerosis of the LAD and right coronary artery.

Mediastinum/Nodes: Small mediastinal lymph nodes which do not meet
pathologic CT size criteria.

Visualized thyroid is unremarkable.

Lungs/Pleura: Biapical pleural-parenchymal scarring.

Mild centrilobular emphysematous changes, upper lobe predominant.

No focal consolidation.

3.7 mm right middle lobe nodule.  3.0 mm left lower lobe nodule

No pleural effusion or pneumothorax.

Upper Abdomen: Visualized upper abdomen is grossly unremarkable.

Musculoskeletal: Mild degenerative changes of the mid thoracic
spine.
IMPRESSION: Lung-RADS 2, benign appearance or behavior. Continue annual
screening with low-dose chest CT without contrast in 12 months.

Aortic Atherosclerosis (H8J3S-HDE.E) and Emphysema (H8J3S-S58.Q).

## 2020-02-05 MED ORDER — ACETAMINOPHEN-CODEINE 300-30 MG PO TABS: 1.0000 | ORAL_TABLET | Freq: Every day | ORAL | 0 refills | Status: DC | PRN

## 2020-02-05 MED ORDER — METHYLPREDNISOLONE ACETATE 80 MG/ML IJ SUSP
80.0000 mg | Freq: Once | INTRAMUSCULAR | Status: AC
Start: 2020-02-05 — End: 2020-02-05
  Administered 2020-02-05: 13:00:00 80 mg via INTRAMUSCULAR

## 2020-02-05 NOTE — Progress Notes (Signed)
HPI:  Patient presents the clinic today for his subsequent annual Medicare wellness exam.  He is also due to follow-up chronic conditions.  Macular Degeneration: Legally blind.  He no longer follows with ophthalmology.  Anxiety: Improved, managed on Citalopram, BuSpar and Xanax.  He is not currently seeing a therapist.  He denies depression, SI/HI.  CSA and UDS from 06/2019 reviewed.  OA: Mainly in his back and knees.  He reports his right shoulder has been bothering him lately.  He takes Tylenol as needed with some relief of symptoms.  COPD: Chronic SOB but denies cough.  He is taking Trelegy and Albuterol as needed.  He wears oxygen at night.  He does continue to smoke.  PFTs from 03/2013 reviewed.  He follows with pulmonology.  Seizure disorder: No recent seizures on Dilantin.  He does not follow with neurology.  HTN: His BP today is 130/80.  He is taking losartan as prescribed.  ECG from 02/2018 reviewed.  Prediabetes: His last A1c was 5.4, 11/2018.  He is not taking any oral diabetic medication at this time.  He does not check his sugars.  History of GI Bleed/GERD: He no longer takes Pantoprazole.  Upper GI from 02/2018 reviewed.  He is not following with GI at this time.  Past Medical History:  Diagnosis Date  . Chronic pain syndrome    Left knee  . COPD (chronic obstructive pulmonary disease) ([place])   . DDD (degenerative disc disease), lumbar 11/2008   L5-S1 on plain film  . Emphysema lung ([place])   . GAD (generalized anxiety disorder)   . GERD (gastroesophageal reflux disease)   . History of multiple pulmonary nodules 07/2010   Follow up CT scans showed resolution by 03/2011--NO MALIGNANCY.  Marland Kitchen History of pneumonia 06/2010  . HTN (hypertension) 01/07/2014  . Osteoarthritis of left knee    + past injury of this knee--tibia fracture?  +left leg shorter than right  . Seizure ([place])   . Seizure disorder ([place])    3 total seizures (first was 1995); neuro eval done and recommended lifetime  phenytoin (he had a seizure after coming off the med in the late 90s  . Tobacco dependence     Current Outpatient Medications  Medication Sig Dispense Refill  . acetaminophen (TYLENOL) 325 MG tablet Take 2 tablets (650 mg total) by mouth every 6 (six) hours as needed for mild pain or headache (or Fever >/= 101). 30 tablet 2  . albuterol (PROVENTIL HFA;VENTOLIN HFA) 108 (90 Base) MCG/ACT inhaler Inhale 2 puffs into the lungs every 6 (six) hours as needed for wheezing or shortness of breath. 1 Inhaler 2  . ALPRAZolam (XANAX) 0.5 MG tablet TAKE 1 TABLET BY MOUTH THREE TIMES DAILY 90 tablet 0  . atorvastatin (LIPITOR) 20 MG tablet TAKE 1 TABLET EVERY DAY 90 tablet 0  . citalopram (CELEXA) 40 MG tablet TAKE 1 TABLET EVERY DAY 90 tablet 0  . diphenhydrAMINE (BENADRYL) 25 MG tablet Take 25 mg by mouth every 6 (six) hours as needed.    . Fluticasone-Umeclidin-Vilant (TRELEGY ELLIPTA) 200-62.5-25 MCG/INH AEPB Inhale 1 puff into the lungs daily. 60 each 0  . losartan (COZAAR) 50 MG tablet TAKE 1 TABLET EVERY DAY 90 tablet 0  . ondansetron (ZOFRAN) 4 MG tablet Take 1 tablet (4 mg total) by mouth every 6 (six) hours as needed for nausea or vomiting. (Patient not taking: Reported on 12/25/2019) 20 tablet 0  . OXYGEN Inhale into the lungs at bedtime.    Marland Kitchen  phenytoin (DILANTIN) 100 MG ER capsule TAKE 3 CAPSULES EVERY DAY 270 capsule 0  . predniSONE (STERAPRED UNI-PAK 21 TAB) 10 MG (21) TBPK tablet Take as directed in the package. 1 each 0  . TRELEGY ELLIPTA 100-62.5-25 MCG/INH AEPB INHALE 1 PUFF INTO THE LUNGS DAILY 60 each 6   No current facility-administered medications for this visit.    Allergies  Allergen Reactions  . Nsaids Other (See Comments)    GI bleed and vomiting    Family History  Problem Relation Age of Onset  . Heart disease Mother   . Heart disease Father   . Cancer Sister   . Stroke Neg Hx     Social History   Socioeconomic History  . Marital status: Married    Spouse name:  Not on file  . Number of children: Not on file  . Years of education: Not on file  . Highest education level: Not on file  Occupational History  . Not on file  Tobacco Use  . Smoking status: Current Every Day Smoker    Packs/day: 3.00    Years: 51.00    Pack years: 153.00    Types: Cigarettes  . Smokeless tobacco: Never Used  . Tobacco comment: 1 ppd/ 12/25/2019  Vaping Use  . Vaping Use: Never used  Substance and Sexual Activity  . Alcohol use: No    Alcohol/week: 0.0 standard drinks  . Drug use: Not Currently    Types: Marijuana    Comment: 2 month  . Sexual activity: Yes    Partners: Male  Other Topics Concern  . Not on file  Social History Narrative   Married, 2 grown children   Eighth grade education.  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   Originally from [place].   Tobacco 100 pack-yr hx, still smoking as of 10/2011 (1 pack per day).   Denies alcohol or drug use.   No exercise.   Social Determinants of Health   Financial Resource Strain: Not on file  Food Insecurity: Not on file  Transportation Needs: Not on file  Physical Activity: Not on file  Stress: Not on file  Social Connections: Not on file  Intimate Partner Violence: Not on file    Hospitiliaztions: None  Health Maintenance:    Flu: 02/2018  Tetanus: 08/2008  Pneumovax: 02/2018  Prevnar: 10/2014  Zostavax: never  PSA: 11/2018  Colon Screening:  09/2017, 1 year repeat  Eye Doctor: as needed  Dental Exam: as needed   Providers:   PCP: Webb Silversmith, NP-C  Gastroenterologist: Dr. Sherren Mocha  Pulmonologist: Dr. Ronalee Belts   I have personally reviewed and have noted:  1. The patient's medical and social history 2. Their use of alcohol, tobacco or illicit drugs 3. Their current medications and supplements 4. The patient's functional ability including ADL's, fall risks, home safety risks and  hearing or visual impairment. 5. Diet and physical activities 6. Evidence for  depression or mood disorder  Subjective:   Review of Systems:   Constitutional: Pt reports unintentional weight loss. Denies fever, malaise, fatigue, headache .  HEENT: Pt reports difficulty with vision. Denies eye pain, eye redness, ear pain, ringing in the ears, wax buildup, runny nose, nasal congestion, bloody nose, or sore throat. Respiratory: Pt reports shortness of breath. Denies difficulty breathing, cough or sputum production.   Cardiovascular: Denies chest pain, chest tightness, palpitations or swelling in the hands or feet.  Gastrointestinal: Denies abdominal pain, bloating, constipation, diarrhea or blood in the  stool.  GU: Denies urgency, frequency, pain with urination, burning sensation, blood in urine, odor or discharge. Musculoskeletal: Pt reports right shoulder pain. Denies decrease in range of motion, difficulty with gait, muscle pain or joint swelling.  Skin: Denies redness, rashes, lesions or ulcercations.  Neurological: Denies dizziness, difficulty with memory, difficulty with speech or problems with balance and coordination.  Psych: Pt has a history of anxiety. Denies depression, SI/HI.  No other specific complaints in a complete review of systems (except as listed in HPI above).  Objective:  PE:  BP 130/70 (BP Location: Right Arm, Patient Position: Sitting, Cuff Size: Normal)   Pulse 83   Temp 97.6 F (36.4 C) (Temporal)   Ht 5' 8"  (1.727 m)   Wt 110 lb 12.8 oz (50.3 kg)   SpO2 96%   BMI 16.85 kg/m   Wt Readings from Last 3 Encounters:  12/25/19 130 lb 9.6 oz (59.2 kg)  10/10/19 130 lb (59 kg)  08/08/19 130 lb (59 kg)    General: Appears his stated age, underweight in NAD. Skin: Warm, dry and intact.Raised scaly hyperpigmented lesion of right temple. HEENT: Head: normal shape and size; Eyes: sclera white, no icterus, conjunctiva pink, PERRLA and EOMs intact;  Neck: Neck supple, trachea midline. No masses, lumps or thyromegaly present.  Cardiovascular:  Normal rate and rhythm. S1,S2 noted.  No murmur, rubs or gallops noted. No JVD or BLE edema. No carotid bruits noted. Pulmonary/Chest: Normal effort and coarse breath sounds. No respiratory distress. No wheezes, rales or ronchi noted.  Abdomen: Soft and nontender. Normal bowel sounds. No distention or masses noted. Liver, spleen and kidneys non palpable. Musculoskeletal: Normal internal and external rotation of the right shoulder. No pain with palpation of the right shoulder. Positive drop can test on the right. Strength 5/5 BUE/BLE. No difficulty with gait. Neurological: Alert and oriented. Cranial nerves II-XII grossly intact. Coordination normal.  Psychiatric: Mood and affect normal. Behavior is normal. Judgment and thought content normal.     BMET    Component Value Date/Time   NA 138 03/26/2019 1028   K 4.6 03/26/2019 1028   CL 103 03/26/2019 1028   CO2 29 03/26/2019 1028   GLUCOSE 90 03/26/2019 1028   BUN 13 03/26/2019 1028   CREATININE 0.81 03/26/2019 1028   CREATININE 0.82 11/17/2018 1554   CALCIUM 9.2 03/26/2019 1028   GFRNONAA >60 03/10/2018 0506   GFRAA >60 03/10/2018 0506    Lipid Panel     Component Value Date/Time   CHOL 200 03/26/2019 1028   TRIG 133.0 03/26/2019 1028   HDL 68.10 03/26/2019 1028   CHOLHDL 3 03/26/2019 1028   VLDL 26.6 03/26/2019 1028   LDLCALC 105 (H) 03/26/2019 1028   LDLCALC 127 (H) 11/17/2018 1554    CBC    Component Value Date/Time   WBC 8.0 11/17/2018 1554   RBC 4.55 11/17/2018 1554   HGB 13.3 01/15/2019 1127   HCT 38.5 01/15/2019 1127   PLT 224 11/17/2018 1554   MCV 89.9 11/17/2018 1554   MCH 30.8 11/17/2018 1554   MCHC 34.2 11/17/2018 1554   RDW 13.4 11/17/2018 1554   LYMPHSABS 1.0 03/06/2018 1235   MONOABS 1.0 03/06/2018 1235   EOSABS 0.3 03/06/2018 1235   BASOSABS 0.0 03/06/2018 1235    Hgb A1C Lab Results  Component Value Date   HGBA1C 5.4 11/17/2018      Assessment and Plan:   Medicare Annual Wellness  Visit:  Diet: He does eat meat. He consumes fruits  and veggies daily. He does eat some fried foods. Physical activity: Waking Depression/mood screen: Negative, PHQ 9 score of 0 Hearing: Intact to whispered voice Visual acuity: Legally blind ADLs: Capable Fall risk: High due to eyesight Home safety: Good Cognitive evaluation: Intact to orientation, naming, recall and repetition EOL planning: No adv directives, full code/ I agree  Preventative Medicine: Flu shot today.  He declines tetanus for financial reasons.  Pneumovax, Prevnar and Covid UTD.  He declines Zostavax or Shingrix at this time.  Advised him to call GI to reschedule his colonoscopy.  Encouraged him to consume a balanced diet and exercise regimen.  Advised him to see an eye doctor and dentist annually.  Will check CBC, C met, lipid, A1c and PSA today.  Due dates for screening exams given to patient as part of his AVS.  Right Shoulder Pain:  X-ray right shoulder today Encouraged rest and ice Rx for Tylenol #3 3 times daily as needed Unintentional Weight Loss:  We will check TSH Rx for Miirtazapine 7.5 mg p.o. nightly for appetite stimulation  Skin Lesion of Temple:  Likely SK but will refer to derm for further evaluation  Next appointment: 1 month, weight check   Webb Silversmith, NP This visit occurred during the SARS-CoV-2 public health emergency.  Safety protocols were in place, including screening questions prior to the visit, additional usage of staff PPE, and extensive cleaning of exam room while observing appropriate contact time as indicated for disinfecting solutions.

## 2020-02-06 ENCOUNTER — Encounter: Payer: Self-pay | Admitting: Internal Medicine

## 2020-02-06 MED ORDER — MIRTAZAPINE 7.5 MG PO TABS: 7.5000 mg | ORAL_TABLET | Freq: Every day | ORAL | 1 refills | Status: DC

## 2020-02-06 NOTE — Assessment & Plan Note (Signed)
A1c today.  

## 2020-02-06 NOTE — Assessment & Plan Note (Signed)
Continue Citalopram, BuSpar and Xanax Support offered We will monitor

## 2020-02-06 NOTE — Assessment & Plan Note (Signed)
Stable on Losartan C met today We will monitor

## 2020-02-06 NOTE — Assessment & Plan Note (Signed)
Continue Tylenol as needed Rx for Tylenol 3 for severe pain

## 2020-02-06 NOTE — Patient Instructions (Signed)
Health Maintenance After Age 67 After age 67, you are at a higher risk for certain long-term diseases and infections as well as injuries from falls. Falls are a major cause of broken bones and head injuries in people who are older than age 67. Getting regular preventive care can help to keep you healthy and well. Preventive care includes getting regular testing and making lifestyle changes as recommended by your health care provider. Talk with your health care provider about:  Which screenings and tests you should have. A screening is a test that checks for a disease when you have no symptoms.  A diet and exercise plan that is right for you. What should I know about screenings and tests to prevent falls? Screening and testing are the best ways to find a health problem early. Early diagnosis and treatment give you the best chance of managing medical conditions that are common after age 67. Certain conditions and lifestyle choices may make you more likely to have a fall. Your health care provider may recommend:  Regular vision checks. Poor vision and conditions such as cataracts can make you more likely to have a fall. If you wear glasses, make sure to get your prescription updated if your vision changes.  Medicine review. Work with your health care provider to regularly review all of the medicines you are taking, including over-the-counter medicines. Ask your health care provider about any side effects that may make you more likely to have a fall. Tell your health care provider if any medicines that you take make you feel dizzy or sleepy.  Osteoporosis screening. Osteoporosis is a condition that causes the bones to get weaker. This can make the bones weak and cause them to break more easily.  Blood pressure screening. Blood pressure changes and medicines to control blood pressure can make you feel dizzy.  Strength and balance checks. Your health care provider may recommend certain tests to check your  strength and balance while standing, walking, or changing positions.  Foot health exam. Foot pain and numbness, as well as not wearing proper footwear, can make you more likely to have a fall.  Depression screening. You may be more likely to have a fall if you have a fear of falling, feel emotionally low, or feel unable to do activities that you used to do.  Alcohol use screening. Using too much alcohol can affect your balance and may make you more likely to have a fall. What actions can I take to lower my risk of falls? General instructions  Talk with your health care provider about your risks for falling. Tell your health care provider if: ? You fall. Be sure to tell your health care provider about all falls, even ones that seem minor. ? You feel dizzy, sleepy, or off-balance.  Take over-the-counter and prescription medicines only as told by your health care provider. These include any supplements.  Eat a healthy diet and maintain a healthy weight. A healthy diet includes low-fat dairy products, low-fat (lean) meats, and fiber from whole grains, beans, and lots of fruits and vegetables. Home safety  Remove any tripping hazards, such as rugs, cords, and clutter.  Install safety equipment such as grab bars in bathrooms and safety rails on stairs.  Keep rooms and walkways well-lit. Activity   Follow a regular exercise program to stay fit. This will help you maintain your balance. Ask your health care provider what types of exercise are appropriate for you.  If you need a cane or   walker, use it as recommended by your health care provider.  Wear supportive shoes that have nonskid soles. Lifestyle  Do not drink alcohol if your health care provider tells you not to drink.  If you drink alcohol, limit how much you have: ? 0-1 drink a day for women. ? 0-2 drinks a day for men.  Be aware of how much alcohol is in your drink. In the [place] one drink equals one typical bottle of beer (12  oz), one-half glass of wine (5 oz), or one shot of hard liquor (1 oz).  Do not use any products that contain nicotine or tobacco, such as cigarettes and e-cigarettes. If you need help quitting, ask your health care provider. Summary  Having a healthy lifestyle and getting preventive care can help to protect your health and wellness after age 67.  Screening and testing are the best way to find a health problem early and help you avoid having a fall. Early diagnosis and treatment give you the best chance for managing medical conditions that are more common for people who are older than age 67.  Falls are a major cause of broken bones and head injuries in people who are older than age 67. Take precautions to prevent a fall at home.  Work with your health care provider to learn what changes you can make to improve your health and wellness and to prevent falls. This information is not intended to replace advice given to you by your health care provider. Make sure you discuss any questions you have with your health care provider. Document Revised: 05/25/2018 Document Reviewed: 12/15/2016 Elsevier Patient Education  2020 Elsevier Inc.  

## 2020-02-06 NOTE — Assessment & Plan Note (Signed)
No issues off meds We will monitor 

## 2020-02-06 NOTE — Assessment & Plan Note (Signed)
No issues on Dilantin C met today We will monitor

## 2020-02-06 NOTE — Assessment & Plan Note (Signed)
Legally blind We will monitor

## 2020-02-06 NOTE — Assessment & Plan Note (Signed)
Encourage smoking cessation He is getting low-dose lung cancer screening yearly Continue Trelegy and albuterol Continue to follow with pulmonology

## 2020-02-11 ENCOUNTER — Other Ambulatory Visit: Payer: Self-pay | Admitting: Internal Medicine

## 2020-02-11 DIAGNOSIS — G8929 Other chronic pain: Secondary | ICD-10-CM

## 2020-02-11 NOTE — Progress Notes (Signed)
Referral to ortho placed for eval right shoulder pain.

## 2020-02-11 NOTE — Addendum Note (Signed)
Addended by: [name] on: 02/11/2020 10:03 AM   Modules accepted: Orders

## 2020-02-12 ENCOUNTER — Other Ambulatory Visit: Payer: Self-pay | Admitting: Internal Medicine

## 2020-02-12 NOTE — Telephone Encounter (Signed)
Last filled 01/14/2020...Marland Kitchen please advise

## 2020-02-13 ENCOUNTER — Telehealth (INDEPENDENT_AMBULATORY_CARE_PROVIDER_SITE_OTHER): Payer: Self-pay | Admitting: Gastroenterology

## 2020-02-13 ENCOUNTER — Other Ambulatory Visit: Payer: Self-pay

## 2020-02-13 DIAGNOSIS — Z86010 Personal history of colonic polyps: Secondary | ICD-10-CM

## 2020-02-13 MED ORDER — NA SULFATE-K SULFATE-MG SULF 17.5-3.13-1.6 GM/177ML PO SOLN
1.0000 | Freq: Once | ORAL | 0 refills | Status: AC
Start: 2020-02-13 — End: 2020-02-13

## 2020-02-13 NOTE — Progress Notes (Signed)
Gastroenterology Pre-Procedure Review  Request Date: Wed 02/27/20  Requesting Physician: Dr. Marius Ditch  PATIENT REVIEW QUESTIONS: The patient responded to the following health history questions as indicated:    1. Are you having any GI issues? no 2. Do you have a personal history of Polyps? yes (10/13/17 Dr. Marius Ditch noted colon polyps) 3. Do you have a family history of Colon Cancer or Polyps? no 4. Diabetes Mellitus? no 5. Joint replacements in the past 12 months?no 6. Major health problems in the past 3 months?no 7. Any artificial heart valves, MVP, or defibrillator?no    MEDICATIONS & ALLERGIES:    Patient reports the following regarding taking any anticoagulation/antiplatelet therapy:   Plavix, Coumadin, Eliquis, Xarelto, Lovenox, Pradaxa, Brilinta, or Effient? no Aspirin? no  Patient confirms/reports the following medications:  Current Outpatient Medications  Medication Sig Dispense Refill  . acetaminophen (TYLENOL) 325 MG tablet Take 2 tablets (650 mg total) by mouth every 6 (six) hours as needed for mild pain or headache (or Fever >/= 101). 30 tablet 2  . Acetaminophen-Codeine 300-30 MG tablet Take 1 tablet by mouth daily as needed for pain. 30 tablet 0  . albuterol (PROVENTIL HFA;VENTOLIN HFA) 108 (90 Base) MCG/ACT inhaler Inhale 2 puffs into the lungs every 6 (six) hours as needed for wheezing or shortness of breath. 1 Inhaler 2  . ALPRAZolam (XANAX) 0.5 MG tablet TAKE 1 TABLET BY MOUTH THREE TIMES DAILY 90 tablet 0  . atorvastatin (LIPITOR) 20 MG tablet TAKE 1 TABLET EVERY DAY 90 tablet 0  . citalopram (CELEXA) 40 MG tablet TAKE 1 TABLET EVERY DAY 90 tablet 0  . diphenhydrAMINE (BENADRYL) 25 MG tablet Take 25 mg by mouth every 6 (six) hours as needed.    . Fluticasone-Umeclidin-Vilant (TRELEGY ELLIPTA) 200-62.5-25 MCG/INH AEPB Inhale 1 puff into the lungs daily. 60 each 0  . losartan (COZAAR) 50 MG tablet TAKE 1 TABLET EVERY DAY 90 tablet 0  . mirtazapine (REMERON) 7.5 MG tablet  Take 1 tablet (7.5 mg total) by mouth at bedtime. 30 tablet 1  . ondansetron (ZOFRAN) 4 MG tablet Take 1 tablet (4 mg total) by mouth every 6 (six) hours as needed for nausea or vomiting. 20 tablet 0  . OXYGEN Inhale into the lungs at bedtime.    . phenytoin (DILANTIN) 100 MG ER capsule TAKE 3 CAPSULES EVERY DAY 270 capsule 0  . TRELEGY ELLIPTA 100-62.5-25 MCG/INH AEPB INHALE 1 PUFF INTO THE LUNGS DAILY 60 each 6  . predniSONE (STERAPRED UNI-PAK 21 TAB) 10 MG (21) TBPK tablet Take as directed in the package. (Patient not taking: No sig reported) 1 each 0   No current facility-administered medications for this visit.    Patient confirms/reports the following allergies:  Allergies  Allergen Reactions  . Nsaids Other (See Comments)    GI bleed and vomiting    No orders of the defined types were placed in this encounter.   AUTHORIZATION INFORMATION Primary Insurance: 1D#: Group #:  Secondary Insurance: 1D#: Group #:  SCHEDULE INFORMATION: Date: 02/27/20 Time: Location:[facility]

## 2020-02-25 ENCOUNTER — Other Ambulatory Visit
Admission: RE | Admit: 2020-02-25 | Discharge: 2020-02-25 | Disposition: A | Discharge status: Home / Self Care | Payer: Medicare HMO | Source: Ambulatory Visit | Attending: Gastroenterology | Admitting: Gastroenterology

## 2020-02-25 ENCOUNTER — Other Ambulatory Visit: Payer: Self-pay

## 2020-02-25 DIAGNOSIS — Z01812 Encounter for preprocedural laboratory examination: Secondary | ICD-10-CM | POA: Diagnosis not present

## 2020-02-25 DIAGNOSIS — Z20822 Contact with and (suspected) exposure to covid-19: Secondary | ICD-10-CM | POA: Diagnosis not present

## 2020-02-25 LAB — SARS CORONAVIRUS 2 (TAT 6-24 HRS): SARS Coronavirus 2: NEGATIVE

## 2020-02-26 ENCOUNTER — Encounter: Payer: Self-pay | Admitting: Gastroenterology

## 2020-02-27 ENCOUNTER — Ambulatory Visit
Admission: RE | Admit: 2020-02-27 | Discharge: 2020-02-27 | Disposition: A | Discharge status: Home / Self Care | Payer: Medicare HMO | Attending: Gastroenterology | Admitting: Gastroenterology

## 2020-02-27 ENCOUNTER — Encounter
Admission: RE | Disposition: A | Discharge status: Home / Self Care | Payer: Self-pay | Source: Home / Self Care | Attending: Gastroenterology

## 2020-02-27 ENCOUNTER — Ambulatory Visit: Payer: Medicare HMO | Admitting: Anesthesiology

## 2020-02-27 ENCOUNTER — Encounter: Payer: Self-pay | Admitting: Gastroenterology

## 2020-02-27 DIAGNOSIS — G40909 Epilepsy, unspecified, not intractable, without status epilepticus: Secondary | ICD-10-CM | POA: Diagnosis not present

## 2020-02-27 DIAGNOSIS — Z9981 Dependence on supplemental oxygen: Secondary | ICD-10-CM | POA: Diagnosis not present

## 2020-02-27 DIAGNOSIS — Z791 Long term (current) use of non-steroidal anti-inflammatories (NSAID): Secondary | ICD-10-CM | POA: Diagnosis not present

## 2020-02-27 DIAGNOSIS — Z1211 Encounter for screening for malignant neoplasm of colon: Secondary | ICD-10-CM | POA: Insufficient documentation

## 2020-02-27 DIAGNOSIS — Z7952 Long term (current) use of systemic steroids: Secondary | ICD-10-CM | POA: Diagnosis not present

## 2020-02-27 DIAGNOSIS — Z860100 Personal history of colon polyps, unspecified: Secondary | ICD-10-CM

## 2020-02-27 DIAGNOSIS — K219 Gastro-esophageal reflux disease without esophagitis: Secondary | ICD-10-CM | POA: Insufficient documentation

## 2020-02-27 DIAGNOSIS — K644 Residual hemorrhoidal skin tags: Secondary | ICD-10-CM | POA: Diagnosis not present

## 2020-02-27 DIAGNOSIS — Z8249 Family history of ischemic heart disease and other diseases of the circulatory system: Secondary | ICD-10-CM | POA: Diagnosis not present

## 2020-02-27 DIAGNOSIS — J449 Chronic obstructive pulmonary disease, unspecified: Secondary | ICD-10-CM | POA: Insufficient documentation

## 2020-02-27 DIAGNOSIS — Z809 Family history of malignant neoplasm, unspecified: Secondary | ICD-10-CM | POA: Diagnosis not present

## 2020-02-27 DIAGNOSIS — Z79899 Other long term (current) drug therapy: Secondary | ICD-10-CM | POA: Insufficient documentation

## 2020-02-27 DIAGNOSIS — J439 Emphysema, unspecified: Secondary | ICD-10-CM | POA: Diagnosis not present

## 2020-02-27 DIAGNOSIS — I10 Essential (primary) hypertension: Secondary | ICD-10-CM | POA: Diagnosis not present

## 2020-02-27 HISTORY — PX: COLONOSCOPY WITH PROPOFOL: SHX5780

## 2020-02-27 SURGERY — COLONOSCOPY WITH PROPOFOL
Anesthesia: General

## 2020-02-27 MED ORDER — SODIUM CHLORIDE 0.9 % IV SOLN: INTRAVENOUS | Status: DC

## 2020-02-27 MED ORDER — DEXMEDETOMIDINE (PRECEDEX) IN NS 20 MCG/5ML (4 MCG/ML) IV SYRINGE
PREFILLED_SYRINGE | INTRAVENOUS | Status: DC | PRN
  Administered 2020-02-27: 20 ug via INTRAVENOUS

## 2020-02-27 MED ORDER — LIDOCAINE HCL (CARDIAC) PF 100 MG/5ML IV SOSY
PREFILLED_SYRINGE | INTRAVENOUS | Status: DC | PRN
  Administered 2020-02-27: 40 mg via INTRAVENOUS

## 2020-02-27 MED ORDER — PROPOFOL 500 MG/50ML IV EMUL
INTRAVENOUS | Status: AC
  Filled 2020-02-27: qty 50

## 2020-02-27 MED ORDER — MIDAZOLAM HCL 2 MG/2ML IJ SOLN
INTRAMUSCULAR | Status: AC
  Filled 2020-02-27: qty 2

## 2020-02-27 MED ORDER — PROPOFOL 500 MG/50ML IV EMUL
INTRAVENOUS | Status: DC | PRN
  Administered 2020-02-27: 150 ug/kg/min via INTRAVENOUS

## 2020-02-27 MED ORDER — PROPOFOL 10 MG/ML IV BOLUS
INTRAVENOUS | Status: DC | PRN
  Administered 2020-02-27: 80 mg via INTRAVENOUS
  Administered 2020-02-27: 30 mg via INTRAVENOUS

## 2020-02-27 NOTE — Op Note (Signed)
[facility] Gastroenterology Patient Name: Elijah Mcfarland Procedure Date: 02/27/2020 9:42 AM MRN: [id number] Account #: [id number] Date of Birth: 1950-10-02 Admit Type: Outpatient Age: 70 Room: [facility] ENDO ROOM 4 Gender: Male Note Status: Finalized Procedure:             Colonoscopy Indications:           Surveillance: History of adenomatous polyps,                         inadequate prep on last exam (<57yr), Last colonoscopy:                         August 2019 Providers:             Lin Landsman MD, MD Referring MD:          [name] (Referring MD) Medicines:             General Anesthesia Complications:         No immediate complications. Estimated blood loss: None. Procedure:             Pre-Anesthesia Assessment:                        - Prior to the procedure, a History and Physical was                         performed, and patient medications and allergies were                         reviewed. The patient is competent. The risks and                         benefits of the procedure and the sedation options and                         risks were discussed with the patient. All questions                         were answered and informed consent was obtained.                         Patient identification and proposed procedure were                         verified by the physician, the nurse, the                         anesthesiologist, the anesthetist and the technician                         in the pre-procedure area in the procedure room in the                         endoscopy suite. Mental Status Examination: alert and                         oriented. Airway Examination: normal oropharyngeal  airway and neck mobility. Respiratory Examination:                         clear to auscultation. CV Examination: normal.                         Prophylactic Antibiotics: The patient does not require                          prophylactic antibiotics. Prior Anticoagulants: The                         patient has taken no previous anticoagulant or                         antiplatelet agents. ASA Grade Assessment: III - A                         patient with severe systemic disease. After reviewing                         the risks and benefits, the patient was deemed in                         satisfactory condition to undergo the procedure. The                         anesthesia plan was to use general anesthesia.                         Immediately prior to administration of medications,                         the patient was re-assessed for adequacy to receive                         sedatives. The heart rate, respiratory rate, oxygen                         saturations, blood pressure, adequacy of pulmonary                         ventilation, and response to care were monitored                         throughout the procedure. The physical status of the                         patient was re-assessed after the procedure.                        After obtaining informed consent, the colonoscope was                         passed under direct vision. Throughout the procedure,                         the patient's blood pressure, pulse, and oxygen  saturations were monitored continuously. The                         Colonoscope was introduced through the anus and                         advanced to the the cecum, identified by appendiceal                         orifice and ileocecal valve. The colonoscopy was                         extremely difficult due to inadequate bowel prep,                         significant looping and a tortuous colon. Successful                         completion of the procedure was aided by applying                         abdominal pressure and lavage. The patient tolerated                         the procedure well. The quality of the bowel                          preparation was inadequate. Findings:      Skin tags were found on perianal exam.      Copious quantities of semi-liquid stool was found in the entire colon,       precluding visualization. Lavage of the area was performed using 200 -       500 mL of sterile water, resulting in clearance with fair visualization.      The retroflexed view of the distal rectum and anal verge was normal and       showed no anal or rectal abnormalities.      The exam was otherwise without abnormality. Impression:            - Preparation of the colon was inadequate.                        - Perianal skin tags found on perianal exam.                        - Stool in the entire examined colon.                        - The distal rectum and anal verge are normal on                         retroflexion view.                        - The examination was otherwise normal.                        - No specimens collected. Recommendation:        - Discharge patient to home (with escort).                        -  Resume previous diet today.                        - Continue present medications.                        - Repeat colonoscopy in 3 years with 2 day prep                         because the bowel preparation was poor. Procedure Code(s):     --- Professional ---                        R9163, Colorectal cancer screening; colonoscopy on                         individual at high risk Diagnosis Code(s):     --- Professional ---                        Z86.010, Personal history of colonic polyps                        K64.4, Residual hemorrhoidal skin tags CPT copyright 2019 American Medical Association. All rights reserved. The codes documented in this report are preliminary and upon coder review may  be revised to meet current compliance requirements. Dr. Ulyess Mort Lin Landsman MD, MD 02/27/2020 10:27:52 AM This report has been signed electronically. Number of Addenda: 0 Note Initiated On:  02/27/2020 9:42 AM Scope Withdrawal Time: 0 hours 7 minutes 32 seconds  Total Procedure Duration: 0 hours 20 minutes 40 seconds  Estimated Blood Loss:  Estimated blood loss: none.      [facility]

## 2020-02-27 NOTE — Anesthesia Procedure Notes (Signed)
Date/Time: 02/27/2020 9:53 AM Performed by: Doreen Salvage, CRNA Pre-anesthesia Checklist: Patient identified, Emergency Drugs available, Suction available and Patient being monitored Patient Re-evaluated:Patient Re-evaluated prior to induction Oxygen Delivery Method: Nasal cannula Induction Type: IV induction Dental Injury: Teeth and Oropharynx as per pre-operative assessment  Comments: Nasal cannula with etCO2 monitoring

## 2020-02-27 NOTE — Anesthesia Preprocedure Evaluation (Signed)
Anesthesia Evaluation  Patient identified by MRN, date of birth, ID band Patient awake    Reviewed: Allergy & Precautions, H&P , NPO status , Patient's Chart, lab work & pertinent test results  History of Anesthesia Complications Negative for: history of anesthetic complications  Airway Mallampati: III  TM Distance: >3 FB Neck ROM: limited    Dental  (+) Upper Dentures, Lower Dentures   Pulmonary COPD,  oxygen dependent, Current Smoker,    Pulmonary exam normal        Cardiovascular Exercise Tolerance: Good hypertension, (-) angina(-) Past MI Normal cardiovascular exam     Neuro/Psych Seizures -, Well Controlled,  PSYCHIATRIC DISORDERS    GI/Hepatic negative GI ROS, Neg liver ROS, GERD  Medicated and Controlled,  Endo/Other  negative endocrine ROS  Renal/GU negative Renal ROS  negative genitourinary   Musculoskeletal  (+) Arthritis ,   Abdominal   Peds  Hematology negative hematology ROS (+)   Anesthesia Other Findings Past Medical History: No date: Chronic pain syndrome     Comment:  Left knee No date: COPD (chronic obstructive pulmonary disease) ([place]) 11/2008: DDD (degenerative disc disease), lumbar     Comment:  L5-S1 on plain film No date: Emphysema lung (HCC) No date: GAD (generalized anxiety disorder) No date: GERD (gastroesophageal reflux disease) 07/2010: History of multiple pulmonary nodules     Comment:  Follow up CT scans showed resolution by 03/2011--NO               MALIGNANCY. 06/2010: History of pneumonia 01/07/2014: HTN (hypertension) No date: Osteoarthritis of left knee     Comment:  + past injury of this knee--tibia fracture?  +left leg               shorter than right No date: Seizure ([place]) No date: Seizure disorder [facility])     Comment:  3 total seizures (first was 1995); neuro eval done and               recommended lifetime phenytoin (he had a seizure after               coming off the  med in the late 90s No date: Tobacco dependence  Past Surgical History: 2010: COLONOSCOPY     Comment:  At [facility]; normal per pt 09/28/2017: COLONOSCOPY WITH PROPOFOL; N/A     Comment:  Procedure: COLONOSCOPY WITH PROPOFOL;  Surgeon: [name], MD;  Location: [facility] ENDOSCOPY;  Service:               Gastroenterology;  Laterality: N/A; 10/13/2017: COLONOSCOPY WITH PROPOFOL; N/A     Comment:  Procedure: COLONOSCOPY WITH PROPOFOL;  Surgeon: Lin Landsman, MD;  Location: [facility] ENDOSCOPY;  Service:               Gastroenterology;  Laterality: N/A; 03/01/2018: ESOPHAGOGASTRODUODENOSCOPY; N/A     Comment:  Procedure: ESOPHAGOGASTRODUODENOSCOPY (EGD);  Surgeon:               Rogene Houston, MD;  Location: [facility] ENDO SUITE;  Service:              Endoscopy;  Laterality: N/A; 03/06/2018: ESOPHAGOGASTRODUODENOSCOPY; N/A     Comment:  Procedure: ESOPHAGOGASTRODUODENOSCOPY (EGD);  Surgeon:               Rogene Houston, MD;  Location: [facility] ENDO SUITE;  Service:              Endoscopy;  Laterality: N/A; 03/09/2018: ESOPHAGOGASTRODUODENOSCOPY; N/A     Comment:  Procedure: ESOPHAGOGASTRODUODENOSCOPY (EGD);  Surgeon:               Rogene Houston, MD;  Location: [facility] ENDO SUITE;  Service:              Endoscopy;  Laterality: N/A; 03/08/2018: GIVENS CAPSULE STUDY; N/A     Comment:  Procedure: GIVENS CAPSULE STUDY;  Surgeon: Rogene Houston, MD;  Location: [facility] ENDO SUITE;  Service:               Endoscopy;  Laterality: N/A; No date: KNEE SURGERY     Comment:  Left : x 2  BMI    Body Mass Index: 16.73 kg/m      Reproductive/Obstetrics negative OB ROS                             Anesthesia Physical Anesthesia Plan  ASA: III  Anesthesia Plan: General   Post-op Pain Management:    Induction: Intravenous  PONV Risk Score and Plan: Propofol infusion and TIVA  Airway Management Planned: Natural Airway and Nasal  Cannula  Additional Equipment:   Intra-op Plan:   Post-operative Plan:   Informed Consent: I have reviewed the patients History and Physical, chart, labs and discussed the procedure including the risks, benefits and alternatives for the proposed anesthesia with the patient or authorized representative who has indicated his/her understanding and acceptance.     Dental Advisory Given  Plan Discussed with: Anesthesiologist, CRNA and Surgeon  Anesthesia Plan Comments: (Patient consented for risks of anesthesia including but not limited to:  - adverse reactions to medications - risk of airway placement if required - damage to eyes, teeth, lips or other oral mucosa - nerve damage due to positioning  - sore throat or hoarseness - Damage to heart, brain, nerves, lungs, other parts of body or loss of life  Patient voiced understanding.)        Anesthesia Quick Evaluation

## 2020-02-27 NOTE — Anesthesia Postprocedure Evaluation (Signed)
Anesthesia Post Note  Patient: Elijah Mcfarland  Procedure(s) Performed: COLONOSCOPY WITH PROPOFOL (N/A )  Patient location during evaluation: Endoscopy Anesthesia Type: General Level of consciousness: awake and alert Pain management: pain level controlled Vital Signs Assessment: post-procedure vital signs reviewed and stable Respiratory status: spontaneous breathing, nonlabored ventilation, respiratory function stable and patient connected to nasal cannula oxygen Cardiovascular status: blood pressure returned to baseline and stable Postop Assessment: no apparent nausea or vomiting Anesthetic complications: no   No complications documented.   Last Vitals:  Vitals:   02/27/20 1024 02/27/20 1030  BP: 106/67 111/74  Pulse:  (!) 53  Resp: 15 14  Temp: (!) 36.2 C   SpO2: 100% 98%    Last Pain:  Vitals:   02/27/20 1024  TempSrc: Tympanic  PainSc:                  Precious Haws Devrin Monforte

## 2020-02-27 NOTE — H&P (Signed)
Cephas Darby, MD [address]  [place], [place] [address]  Main: [phone]  Fax: [phone] Pager: [phone]  Primary Care Physician:  [name], NP Primary Gastroenterologist:  Dr. Cephas Darby  Pre-Procedure History & Physical: HPI:  Elijah Mcfarland is a 70 y.o. male is here for an colonoscopy.   Past Medical History:  Diagnosis Date  . Chronic pain syndrome    Left knee  . COPD (chronic obstructive pulmonary disease) ([place])   . DDD (degenerative disc disease), lumbar 11/2008   L5-S1 on plain film  . Emphysema lung ([place])   . GAD (generalized anxiety disorder)   . GERD (gastroesophageal reflux disease)   . History of multiple pulmonary nodules 07/2010   Follow up CT scans showed resolution by 03/2011--NO MALIGNANCY.  Marland Kitchen History of pneumonia 06/2010  . HTN (hypertension) 01/07/2014  . Osteoarthritis of left knee    + past injury of this knee--tibia fracture?  +left leg shorter than right  . Seizure ([place])   . Seizure disorder ([place])    3 total seizures (first was 1995); neuro eval done and recommended lifetime phenytoin (he had a seizure after coming off the med in the late 90s  . Tobacco dependence     Past Surgical History:  Procedure Laterality Date  . COLONOSCOPY  2010   At [facility]; normal per pt  . COLONOSCOPY WITH PROPOFOL N/A 09/28/2017   Procedure: COLONOSCOPY WITH PROPOFOL;  Surgeon: [name], MD;  Location: [facility] ENDOSCOPY;  Service: Gastroenterology;  Laterality: N/A;  . COLONOSCOPY WITH PROPOFOL N/A 10/13/2017   Procedure: COLONOSCOPY WITH PROPOFOL;  Surgeon: Lin Landsman, MD;  Location: [facility] ENDOSCOPY;  Service: Gastroenterology;  Laterality: N/A;  . ESOPHAGOGASTRODUODENOSCOPY N/A 03/01/2018   Procedure: ESOPHAGOGASTRODUODENOSCOPY (EGD);  Surgeon: Rogene Houston, MD;  Location: [facility] ENDO SUITE;  Service: Endoscopy;  Laterality: N/A;  . ESOPHAGOGASTRODUODENOSCOPY N/A 03/06/2018   Procedure: ESOPHAGOGASTRODUODENOSCOPY (EGD);   Surgeon: Rogene Houston, MD;  Location: [facility] ENDO SUITE;  Service: Endoscopy;  Laterality: N/A;  . ESOPHAGOGASTRODUODENOSCOPY N/A 03/09/2018   Procedure: ESOPHAGOGASTRODUODENOSCOPY (EGD);  Surgeon: Rogene Houston, MD;  Location: [facility] ENDO SUITE;  Service: Endoscopy;  Laterality: N/A;  . GIVENS CAPSULE STUDY N/A 03/08/2018   Procedure: GIVENS CAPSULE STUDY;  Surgeon: Rogene Houston, MD;  Location: [facility] ENDO SUITE;  Service: Endoscopy;  Laterality: N/A;  . KNEE SURGERY     Left : x 2    Prior to Admission medications   Medication Sig Start Date End Date Taking? Authorizing Provider  diphenhydrAMINE (BENADRYL) 25 MG tablet Take 25 mg by mouth every 6 (six) hours as needed.   Yes [provider]  acetaminophen (TYLENOL) 325 MG tablet Take 2 tablets (650 mg total) by mouth every 6 (six) hours as needed for mild pain or headache (or Fever >/= 101). 03/10/18   Roxan Hockey, MD  Acetaminophen-Codeine 300-30 MG tablet Take 1 tablet by mouth daily as needed for pain. 02/05/20   [name], NP  albuterol (PROVENTIL HFA;VENTOLIN HFA) 108 (90 Base) MCG/ACT inhaler Inhale 2 puffs into the lungs every 6 (six) hours as needed for wheezing or shortness of breath. 03/10/18   Roxan Hockey, MD  ALPRAZolam [name]) 0.5 MG tablet TAKE 1 TABLET BY MOUTH THREE TIMES DAILY 02/12/20   [name], NP  atorvastatin (LIPITOR) 20 MG tablet TAKE 1 TABLET EVERY DAY 11/22/19   [name], NP  citalopram (CELEXA) 40 MG tablet TAKE 1 TABLET EVERY DAY  12/11/19   [name], NP  Fluticasone-Umeclidin-Vilant (TRELEGY ELLIPTA) 200-62.5-25 MCG/INH AEPB Inhale 1 puff into the lungs daily. 12/25/19   Tyler Pita, MD  losartan (COZAAR) 50 MG tablet TAKE 1 TABLET EVERY DAY 12/11/19   [name], NP  mirtazapine (REMERON) 7.5 MG tablet Take 1 tablet (7.5 mg total) by mouth at bedtime. 02/06/20   [name], NP  ondansetron (ZOFRAN) 4 MG tablet Take 1 tablet (4 mg total) by mouth every 6 (six)  hours as needed for nausea or vomiting. 03/10/18   Roxan Hockey, MD  OXYGEN Inhale into the lungs at bedtime.    [provider]  phenytoin (DILANTIN) 100 MG ER capsule TAKE 3 CAPSULES EVERY DAY 12/11/19   Baity, Coralie Keens, NP  predniSONE (STERAPRED UNI-PAK 21 TAB) 10 MG (21) TBPK tablet Take as directed in the package. Patient not taking: No sig reported 12/25/19   Tyler Pita, MD  TRELEGY ELLIPTA 100-62.5-25 MCG/INH AEPB INHALE 1 PUFF INTO THE LUNGS DAILY 11/13/19   Tyler Pita, MD    Allergies as of 02/13/2020 - Review Complete 02/13/2020  Allergen Reaction Noted  . Nsaids Other (See Comments) 04/03/2018    Family History  Problem Relation Age of Onset  . Heart disease Mother   . Heart disease Father   . Cancer Sister   . Stroke Neg Hx     Social History   Socioeconomic History  . Marital status: Married    Spouse name: Not on file  . Number of children: Not on file  . Years of education: Not on file  . Highest education level: Not on file  Occupational History  . Not on file  Tobacco Use  . Smoking status: Current Every Day Smoker    Packs/day: 3.00    Years: 51.00    Pack years: 153.00    Types: Cigarettes  . Smokeless tobacco: Never Used  . Tobacco comment: 1 ppd/ 12/25/2019  Vaping Use  . Vaping Use: Never used  Substance and Sexual Activity  . Alcohol use: No    Alcohol/week: 0.0 standard drinks  . Drug use: Not Currently    Types: Marijuana    Comment: 2 month  . Sexual activity: Yes    Partners: Male  Other Topics Concern  . Not on file  Social History Narrative   Married, 2 grown children   Eighth grade education.  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   Originally from [place].   Tobacco 100 pack-yr hx, still smoking as of 10/2011 (1 pack per day).   Denies alcohol or drug use.   No exercise.   Social Determinants of Health   Financial Resource Strain: Not on file  Food Insecurity: Not on file   Transportation Needs: Not on file  Physical Activity: Not on file  Stress: Not on file  Social Connections: Not on file  Intimate Partner Violence: Not on file    Review of Systems: See HPI, otherwise negative ROS  Physical Exam: BP (!) 139/94   Pulse 76   Temp (!) 97.2 F (36.2 C) (Temporal)   Resp 16   Ht 5\' 8"  (1.727 m)   Wt 49.9 kg   SpO2 96%   BMI 16.73 kg/m  General:   Alert,  pleasant and cooperative in NAD Head:  Normocephalic and atraumatic. Neck:  Supple; no masses or thyromegaly. Lungs:  Clear throughout to auscultation.    Heart:  Regular rate and rhythm.  Abdomen:  Soft, nontender and nondistended. Normal bowel sounds, without guarding, and without rebound.   Neurologic:  Alert and  oriented x4;  grossly normal neurologically.  Impression/Plan: Elijah Mcfarland is here for an colonoscopy to be performed for h/o colon polyps  Risks, benefits, limitations, and alternatives regarding  colonoscopy have been reviewed with the patient.  Questions have been answered.  All parties agreeable.   Sherri Sear, MD  02/27/2020, 9:06 AM

## 2020-02-27 NOTE — Transfer of Care (Signed)
Immediate Anesthesia Transfer of Care Note  Patient: Elijah Mcfarland  Procedure(s) Performed: Procedure(s): COLONOSCOPY WITH PROPOFOL (N/A)  Patient Location: PACU and Endoscopy Unit  Anesthesia Type:General  Level of Consciousness: sedated  Airway & Oxygen Therapy: Patient Spontanous Breathing and Patient connected to nasal cannula oxygen  Post-op Assessment: Report given to RN and Post -op Vital signs reviewed and stable  Post vital signs: Reviewed and stable  Last Vitals:  Vitals:   02/27/20 1020 02/27/20 1024  BP: 106/67 106/67  Pulse:    Resp: 15 15  Temp: (!) 36.2 C (!) 36.2 C  SpO2: 412% 820%    Complications: No apparent anesthesia complications

## 2020-02-28 ENCOUNTER — Encounter: Payer: Self-pay | Admitting: Gastroenterology

## 2020-02-28 DIAGNOSIS — J449 Chronic obstructive pulmonary disease, unspecified: Secondary | ICD-10-CM | POA: Diagnosis not present

## 2020-03-07 ENCOUNTER — Other Ambulatory Visit: Payer: Self-pay | Admitting: Internal Medicine

## 2020-03-11 ENCOUNTER — Other Ambulatory Visit: Payer: Self-pay

## 2020-03-11 ENCOUNTER — Encounter: Payer: Self-pay | Admitting: Internal Medicine

## 2020-03-11 ENCOUNTER — Ambulatory Visit (INDEPENDENT_AMBULATORY_CARE_PROVIDER_SITE_OTHER): Payer: Medicare HMO | Admitting: Internal Medicine

## 2020-03-11 VITALS — BP 128/76 | HR 71 | Temp 97.1°F | Wt 136.0 lb

## 2020-03-11 DIAGNOSIS — R634 Abnormal weight loss: Secondary | ICD-10-CM

## 2020-03-11 NOTE — Patient Instructions (Signed)
Daily Weight Record It is important to weigh yourself daily. To do this:  Make sure you use a reliable scale. Use the same scale each day.  Keep this daily weight chart near your scale.  Weigh yourself each morning at the same time.  Before weighing yourself: ? Take off your shoes. ? Make sure you are wearing the same amount of clothing each day.  Write down your weight in the spaces on the form.  Compare today's weight to yesterday's weight.  Bring this form with you to your follow-up visits with your health care provider. Call your health care provider if you have concerns about your weight, including rapid weight gain or loss. Date: ________ Weight: ____________________ Date: ________ Weight: ____________________ Date: ________ Weight: ____________________ Date: ________ Weight: ____________________ Date: ________ Weight: ____________________ Date: ________ Weight: ____________________ Date: ________ Weight: ____________________ Date: ________ Weight: ____________________ Date: ________ Weight: ____________________ Date: ________ Weight: ____________________ Date: ________ Weight: ____________________ Date: ________ Weight: ____________________ Date: ________ Weight: ____________________ Date: ________ Weight: ____________________ Date: ________ Weight: ____________________ Date: ________ Weight: ____________________ Date: ________ Weight: ____________________ Date: ________ Weight: ____________________ Date: ________ Weight: ____________________ Date: ________ Weight: ____________________ Date: ________ Weight: ____________________ Date: ________ Weight: ____________________ Date: ________ Weight: ____________________ Date: ________ Weight: ____________________ Date: ________ Weight: ____________________ Date: ________ Weight: ____________________ Date: ________ Weight: ____________________ Date: ________ Weight: ____________________ Date: ________ Weight: ____________________  Date: ________ Weight: ____________________ Date: ________ Weight: ____________________ Date: ________ Weight: ____________________ Date: ________ Weight: ____________________ Date: ________ Weight: ____________________ Date: ________ Weight: ____________________ Date: ________ Weight: ____________________ Date: ________ Weight: ____________________ Date: ________ Weight: ____________________ Date: ________ Weight: ____________________ Date: ________ Weight: ____________________ Date: ________ Weight: ____________________ Date: ________ Weight: ____________________ Date: ________ Weight: ____________________ Date: ________ Weight: ____________________ Date: ________ Weight: ____________________ Date: ________ Weight: ____________________ Date: ________ Weight: ____________________ Date: ________ Weight: ____________________ Date: ________ Weight: ____________________ Date: ________ Weight: ____________________ This information is not intended to replace advice given to you by your health care provider. Make sure you discuss any questions you have with your health care provider. Document Revised: 01/28/2017 Document Reviewed: 01/31/2017 Elsevier Patient Education  2021 [employer].

## 2020-03-11 NOTE — Progress Notes (Signed)
Subjective:    Patient ID: Elijah Mcfarland, male    DOB: [birth year], 70 y.o.   MRN: [id number]  HPI  Patient presents to the clinic today for 1 month follow-up for weight check. He has had some unintentional weight loss from 130 down to 111 lbs. He was started on Mirtazapine 7.5 mg for appetite stimulation. He has been taking the medication as prescribed but reports it has made him hateful, and slightly confused at night. His weight today is 136with a BMI of 20.68.  Review of Systems      Past Medical History:  Diagnosis Date  . Chronic pain syndrome    Left knee  . COPD (chronic obstructive pulmonary disease) ([place])   . DDD (degenerative disc disease), lumbar 11/2008   L5-S1 on plain film  . Emphysema lung ([place])   . GAD (generalized anxiety disorder)   . GERD (gastroesophageal reflux disease)   . History of multiple pulmonary nodules 07/2010   Follow up CT scans showed resolution by 03/2011--NO MALIGNANCY.  Marland Kitchen History of pneumonia 06/2010  . HTN (hypertension) 01/07/2014  . Osteoarthritis of left knee    + past injury of this knee--tibia fracture?  +left leg shorter than right  . Seizure ([place])   . Seizure disorder ([place])    3 total seizures (first was 1995); neuro eval done and recommended lifetime phenytoin (he had a seizure after coming off the med in the late 90s  . Tobacco dependence     Current Outpatient Medications  Medication Sig Dispense Refill  . acetaminophen (TYLENOL) 325 MG tablet Take 2 tablets (650 mg total) by mouth every 6 (six) hours as needed for mild pain or headache (or Fever >/= 101). 30 tablet 2  . Acetaminophen-Codeine 300-30 MG tablet Take 1 tablet by mouth daily as needed for pain. 30 tablet 0  . albuterol (PROVENTIL HFA;VENTOLIN HFA) 108 (90 Base) MCG/ACT inhaler Inhale 2 puffs into the lungs every 6 (six) hours as needed for wheezing or shortness of breath. 1 Inhaler 2  . ALPRAZolam (XANAX) 0.5 MG tablet TAKE 1 TABLET BY MOUTH THREE TIMES DAILY 90 tablet  0  . atorvastatin (LIPITOR) 20 MG tablet TAKE 1 TABLET EVERY DAY 90 tablet 0  . citalopram (CELEXA) 40 MG tablet TAKE 1 TABLET EVERY DAY 90 tablet 0  . diphenhydrAMINE (BENADRYL) 25 MG tablet Take 25 mg by mouth every 6 (six) hours as needed.    . Fluticasone-Umeclidin-Vilant (TRELEGY ELLIPTA) 200-62.5-25 MCG/INH AEPB Inhale 1 puff into the lungs daily. 60 each 0  . losartan (COZAAR) 50 MG tablet TAKE 1 TABLET EVERY DAY 90 tablet 0  . mirtazapine (REMERON) 7.5 MG tablet Take 1 tablet (7.5 mg total) by mouth at bedtime. 30 tablet 1  . ondansetron (ZOFRAN) 4 MG tablet Take 1 tablet (4 mg total) by mouth every 6 (six) hours as needed for nausea or vomiting. 20 tablet 0  . OXYGEN Inhale into the lungs at bedtime.    . phenytoin (DILANTIN) 100 MG ER capsule TAKE 3 CAPSULES EVERY DAY 270 capsule 0  . predniSONE (STERAPRED UNI-PAK 21 TAB) 10 MG (21) TBPK tablet Take as directed in the package. (Patient not taking: No sig reported) 1 each 0  . TRELEGY ELLIPTA 100-62.5-25 MCG/INH AEPB INHALE 1 PUFF INTO THE LUNGS DAILY 60 each 6   No current facility-administered medications for this visit.    Allergies  Allergen Reactions  . Nsaids Other (See Comments)    GI bleed and vomiting  Family History  Problem Relation Age of Onset  . Heart disease Mother   . Heart disease Father   . Cancer Sister   . Stroke Neg Hx     Social History   Socioeconomic History  . Marital status: Married    Spouse name: Not on file  . Number of children: Not on file  . Years of education: Not on file  . Highest education level: Not on file  Occupational History  . Not on file  Tobacco Use  . Smoking status: Current Every Day Smoker    Packs/day: 3.00    Years: 51.00    Pack years: 153.00    Types: Cigarettes  . Smokeless tobacco: Never Used  . Tobacco comment: 1 ppd/ 12/25/2019  Vaping Use  . Vaping Use: Never used  Substance and Sexual Activity  . Alcohol use: No    Alcohol/week: 0.0 standard drinks   . Drug use: Not Currently    Types: Marijuana    Comment: 2 month  . Sexual activity: Yes    Partners: Male  Other Topics Concern  . Not on file  Social History Narrative   Married, 2 grown children   Eighth grade education.  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   Originally from [place].   Tobacco 100 pack-yr hx, still smoking as of 10/2011 (1 pack per day).   Denies alcohol or drug use.   No exercise.   Social Determinants of Health   Financial Resource Strain: Not on file  Food Insecurity: Not on file  Transportation Needs: Not on file  Physical Activity: Not on file  Stress: Not on file  Social Connections: Not on file  Intimate Partner Violence: Not on file     Constitutional: Denies fever, malaise, fatigue, headache or abrupt weight changes.  Respiratory: Denies difficulty breathing, shortness of breath, cough or sputum production.   Cardiovascular: Denies chest pain, chest tightness, palpitations or swelling in the hands or feet.  Gastrointestinal: Denies abdominal pain, bloating, constipation, diarrhea or blood in the stool.   No other specific complaints in a complete review of systems (except as listed in HPI above).  Objective:   Physical Exam   There were no vitals taken for this visit. Wt Readings from Last 3 Encounters:  02/27/20 110 lb (49.9 kg)  02/05/20 110 lb 12.8 oz (50.3 kg)  12/25/19 130 lb 9.6 oz (59.2 kg)    General: Appears their stated age, well developed, well nourished in NAD. Cardiovascular: Normal rate and rhythm. S1,S2 noted.  No murmur, rubs or gallops noted. No JVD or BLE edema. No carotid bruits noted. Pulmonary/Chest: Normal effort and positive vesicular breath sounds. No respiratory distress. No wheezes, rales or ronchi noted.  Neurological: Alert and oriented.   BMET    Component Value Date/Time   NA 138 02/05/2020 1252   K 4.4 02/05/2020 1252   CL 102 02/05/2020 1252   CO2 31 02/05/2020 1252    GLUCOSE 78 02/05/2020 1252   BUN 11 02/05/2020 1252   CREATININE 0.80 02/05/2020 1252   CREATININE 0.82 11/17/2018 1554   CALCIUM 9.6 02/05/2020 1252   GFRNONAA >60 03/10/2018 0506   GFRAA >60 03/10/2018 0506    Lipid Panel     Component Value Date/Time   CHOL 182 02/05/2020 1252   TRIG 130.0 02/05/2020 1252   HDL 62.20 02/05/2020 1252   CHOLHDL 3 02/05/2020 1252   VLDL 26.0 02/05/2020 1252   LDLCALC 94 02/05/2020 1252  LDLCALC 127 (H) 11/17/2018 1554    CBC    Component Value Date/Time   WBC 9.7 02/05/2020 1252   RBC 4.51 02/05/2020 1252   HGB 13.9 02/05/2020 1252   HCT 42.0 02/05/2020 1252   PLT 222.0 02/05/2020 1252   MCV 93.2 02/05/2020 1252   MCH 30.8 11/17/2018 1554   MCHC 33.1 02/05/2020 1252   RDW 14.0 02/05/2020 1252   LYMPHSABS 1.0 03/06/2018 1235   MONOABS 1.0 03/06/2018 1235   EOSABS 0.3 03/06/2018 1235   BASOSABS 0.0 03/06/2018 1235    Hgb A1C Lab Results  Component Value Date   HGBA1C 5.6 02/05/2020           Assessment & Plan:    Webb Silversmith, NP This visit occurred during the SARS-CoV-2 public health emergency.  Safety protocols were in place, including screening questions prior to the visit, additional usage of staff PPE, and extensive cleaning of exam room while observing appropriate contact time as indicated for disinfecting solutions.

## 2020-03-12 DIAGNOSIS — M25511 Pain in right shoulder: Secondary | ICD-10-CM | POA: Diagnosis not present

## 2020-03-14 ENCOUNTER — Other Ambulatory Visit: Payer: Self-pay | Admitting: Internal Medicine

## 2020-03-14 NOTE — Telephone Encounter (Signed)
Last filled 02/12/2020... please advise

## 2020-03-17 ENCOUNTER — Other Ambulatory Visit: Payer: Self-pay | Admitting: Internal Medicine

## 2020-03-17 DIAGNOSIS — F411 Generalized anxiety disorder: Secondary | ICD-10-CM

## 2020-03-17 DIAGNOSIS — I10 Essential (primary) hypertension: Secondary | ICD-10-CM

## 2020-03-30 DIAGNOSIS — J449 Chronic obstructive pulmonary disease, unspecified: Secondary | ICD-10-CM | POA: Diagnosis not present

## 2020-04-09 ENCOUNTER — Other Ambulatory Visit: Payer: Self-pay | Admitting: Internal Medicine

## 2020-04-15 ENCOUNTER — Other Ambulatory Visit: Payer: Self-pay | Admitting: Internal Medicine

## 2020-04-15 NOTE — Telephone Encounter (Signed)
Last filled 03/16/2020...Marland Kitchen please advise

## 2020-04-27 DIAGNOSIS — J449 Chronic obstructive pulmonary disease, unspecified: Secondary | ICD-10-CM | POA: Diagnosis not present

## 2020-05-01 ENCOUNTER — Other Ambulatory Visit: Payer: Self-pay

## 2020-05-01 ENCOUNTER — Encounter: Payer: Self-pay | Admitting: Pulmonary Disease

## 2020-05-01 ENCOUNTER — Ambulatory Visit: Payer: Medicare HMO | Admitting: Pulmonary Disease

## 2020-05-01 VITALS — BP 132/72 | HR 61 | Temp 97.8°F | Ht 68.0 in | Wt 140.2 lb

## 2020-05-01 DIAGNOSIS — F17210 Nicotine dependence, cigarettes, uncomplicated: Secondary | ICD-10-CM

## 2020-05-01 DIAGNOSIS — J439 Emphysema, unspecified: Secondary | ICD-10-CM

## 2020-05-01 DIAGNOSIS — J449 Chronic obstructive pulmonary disease, unspecified: Secondary | ICD-10-CM | POA: Diagnosis not present

## 2020-05-01 DIAGNOSIS — G4733 Obstructive sleep apnea (adult) (pediatric): Secondary | ICD-10-CM | POA: Diagnosis not present

## 2020-05-01 MED ORDER — TRELEGY ELLIPTA 200-62.5-25 MCG/INH IN AEPB: 1.0000 | INHALATION_SPRAY | Freq: Every day | RESPIRATORY_TRACT | 3 refills | Status: DC

## 2020-05-01 MED ORDER — TRELEGY ELLIPTA 200-62.5-25 MCG/INH IN AEPB: 1.0000 | INHALATION_SPRAY | Freq: Every day | RESPIRATORY_TRACT | 0 refills | Status: DC

## 2020-05-01 NOTE — Patient Instructions (Signed)
We have increased your Trelegy to the 200 size.  Daily, rinse your mouth well after you use it.  You may use your albuterol inhaler before the dose of Trelegy.  Continue to try to quit smoking.  We will see you in follow-up in 4 months time call sooner should any new problems arise.

## 2020-05-01 NOTE — Progress Notes (Signed)
Subjective:    Patient ID: Elijah Mcfarland, male    DOB: April 08, 1950, 70 y.o.   MRN: [id number]  HPI 70 year old current smoker (1 PPD) who presents for follow-up on COPD GOLD class III.  This is a scheduled visit.  He has chronic dyspnea.  He unfortunately continues to smoke 1 pack of cigarettes per day.  The patient has been on Trelegy 100/62.5/25 and notices that this helps however he still has difficulties with wheezing particularly towards the end of the day.  We had tried him previously on Breztri but he did not tolerate this medication well and felt that the Trelegy helped him more.  He was noted to have apnea for which he is on CPAP.  He is doing well with this.    Review of Systems A 10 point review of systems was performed and it is as noted above otherwise negative.  Patient Active Problem List   Diagnosis Date Noted  . History of colonic polyps   . GERD (gastroesophageal reflux disease) 03/01/2018  . Macular degeneration 08/22/2017  . HTN (hypertension) 01/07/2014  . Prediabetes 04/13/2013  . 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 Anxiety state 04/25/2006  . TOBACCO ABUSE 04/25/2006  . 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 COPD (chronic obstructive pulmonary disease) ([place]) 04/25/2006  . Arthritis 04/25/2006   Social History   Tobacco Use  . Smoking status: Current Every Day Smoker    Packs/day: 3.00    Years: 51.00    Pack years: 153.00    Types: Cigarettes  . Smokeless tobacco: Never Used  . Tobacco comment: 1 ppd/ 05/01/2020  Substance Use Topics  . Alcohol use: No    Alcohol/week: 0.0 standard drinks   Allergies  Allergen Reactions  . Nsaids Other (See Comments)    GI bleed and vomiting    Current Meds  Medication Sig  . acetaminophen (TYLENOL) 325 MG tablet Take 2 tablets (650 mg total) by mouth every 6 (six) hours as needed for mild pain or headache (or Fever >/= 101).  . Acetaminophen-Codeine 300-30 MG tablet Take 1 tablet by mouth daily as  needed for pain.  Marland Kitchen albuterol (PROVENTIL HFA;VENTOLIN HFA) 108 (90 Base) MCG/ACT inhaler Inhale 2 puffs into the lungs every 6 (six) hours as needed for wheezing or shortness of breath.  . ALPRAZolam (XANAX) 0.5 MG tablet TAKE 1 TABLET BY MOUTH THREE TIMES DAILY  . atorvastatin (LIPITOR) 20 MG tablet TAKE 1 TABLET EVERY DAY  . citalopram (CELEXA) 40 MG tablet TAKE 1 TABLET EVERY DAY  . diphenhydrAMINE (BENADRYL) 25 MG tablet Take 25 mg by mouth every 6 (six) hours as needed.  Marland Kitchen losartan (COZAAR) 50 MG tablet TAKE 1 TABLET EVERY DAY  . ondansetron (ZOFRAN) 4 MG tablet Take 1 tablet (4 mg total) by mouth every 6 (six) hours as needed for nausea or vomiting.  . OXYGEN Inhale into the lungs at bedtime.  . phenytoin (DILANTIN) 100 MG ER capsule TAKE 3 CAPSULES EVERY DAY  . TRELEGY ELLIPTA 100-62.5-25 MCG/INH AEPB INHALE 1 PUFF INTO THE LUNGS DAILY  . [DISCONTINUED] Fluticasone-Umeclidin-Vilant (TRELEGY ELLIPTA) 200-62.5-25 MCG/INH AEPB Inhale 1 puff into the lungs daily.  . [DISCONTINUED] predniSONE (STERAPRED UNI-PAK 21 TAB) 10 MG (21) TBPK tablet Take as directed in the package.       Objective:   Physical Exam BP 132/72 (BP Location: Left Arm, Cuff Size: Normal)   Pulse 61   Temp 97.8 F (36.6 C) (Temporal)   Ht 5\' 8"  (1.727 m)  Wt 140 lb 3.2 oz (63.6 kg)   SpO2 97%   BMI 21.32 kg/m    GENERAL: Thin, well-developed male, no overt respiratory distress, mild chronic use of accessories, ambulatory. HEAD: Normocephalic, atraumatic.  EYES: Pupils equal, round, reactive to light. No scleral icterus.  MOUTH: Nose/mouth/throat not examined due to masking requirements for COVID 19. NECK: Supple. No thyromegaly. No nodules. No JVD. Trachea midline PULMONARY: Symmetrical air entry, some accessory muscle use, diffuse end expiratory wheezes noted, no other adventitious sounds. CARDIOVASCULAR: S1 and S2. Regular rate and rhythm. No overt murmurs, gallops or rubs noted. GASTROINTESTINAL: No  distention. Benign. MUSCULOSKELETAL: No joint deformity, no clubbing, no edema.  NEUROLOGIC: No focal deficits, fluent speech, awake, alert. SKIN: Intact,warm,dry. No rashes noted on limited exam. PSYCH: Normal mood and behavior      Assessment & Plan:     ICD-10-CM   1. Stage 3 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 ([place])  J44.9    Increase Trelegy to 200/62.5/25 Continue as needed albuterol  2. Tobacco dependence due to cigarettes  F17.210    Patient counseled regards to discontinuation of smoking Oral counseling time 3 to 5 minutes  3. OSA (obstructive sleep apnea)  G47.33    Continue CPAP   Meds ordered this encounter  Medications  . Fluticasone-Umeclidin-Vilant (TRELEGY ELLIPTA) 200-62.5-25 MCG/INH AEPB    Sig: Inhale 1 puff into the lungs daily.    Dispense:  14 each    Refill:  0    Order Specific Question:   Lot Number?    Answer:   [id number]    Order Specific Question:   Expiration Date?    Answer:   02/15/2021    Order Specific Question:   Manufacturer?    Answer:   [employer] [12]    Order Specific Question:   Quantity    Answer:   1  . Fluticasone-Umeclidin-Vilant (TRELEGY ELLIPTA) 200-62.5-25 MCG/INH AEPB    Sig: Inhale 1 puff into the lungs daily.    Dispense:  90 each    Refill:  3   Will see the patient in follow-up in 4 months time he is to contact us prior to that time should any new issues arise.  Renold Don, MD [facility]   *This note was dictated using voice recognition software/Dragon.  Despite best efforts to proofread, errors can occur which can change the meaning.  Any change was purely unintentional.

## 2020-05-09 ENCOUNTER — Encounter: Payer: Self-pay | Admitting: Pulmonary Disease

## 2020-05-16 ENCOUNTER — Other Ambulatory Visit: Payer: Self-pay | Admitting: Internal Medicine

## 2020-05-16 NOTE — Telephone Encounter (Signed)
Last filled 04/16/2020... please advise

## 2020-05-28 DIAGNOSIS — J449 Chronic obstructive pulmonary disease, unspecified: Secondary | ICD-10-CM | POA: Diagnosis not present

## 2020-06-05 ENCOUNTER — Encounter: Payer: Self-pay | Admitting: Pulmonary Disease

## 2020-06-13 ENCOUNTER — Other Ambulatory Visit: Payer: Self-pay | Admitting: Internal Medicine

## 2020-06-13 NOTE — Telephone Encounter (Signed)
Last filled 05/16/2020... has upcoming appt with you... please advise

## 2020-06-16 ENCOUNTER — Other Ambulatory Visit: Payer: Self-pay | Admitting: Internal Medicine

## 2020-06-16 DIAGNOSIS — I10 Essential (primary) hypertension: Secondary | ICD-10-CM

## 2020-06-16 DIAGNOSIS — F411 Generalized anxiety disorder: Secondary | ICD-10-CM

## 2020-06-27 DIAGNOSIS — J449 Chronic obstructive pulmonary disease, unspecified: Secondary | ICD-10-CM | POA: Diagnosis not present

## 2020-07-10 ENCOUNTER — Ambulatory Visit: Payer: Medicare HMO | Admitting: Dermatology

## 2020-07-16 ENCOUNTER — Other Ambulatory Visit: Payer: Self-pay | Admitting: Internal Medicine

## 2020-07-16 NOTE — Telephone Encounter (Signed)
Requested medication (s) are due for refill today: yes  Requested medication (s) are on the active medication list: yes  Last refill:  06/17/20  Future visit scheduled: yes  Notes to clinic:  not delegated    Requested Prescriptions  Pending Prescriptions Disp Refills   ALPRAZolam (XANAX) 0.5 MG tablet [Pharmacy Med Name: ALPRAZOLAM 0.5MG  TABLETS] 90 tablet     Sig: TAKE 1 TABLET BY MOUTH THREE TIMES DAILY      There is no refill protocol information for this order

## 2020-07-21 ENCOUNTER — Other Ambulatory Visit: Payer: Self-pay

## 2020-07-21 ENCOUNTER — Encounter: Payer: Self-pay | Admitting: Internal Medicine

## 2020-07-21 ENCOUNTER — Ambulatory Visit (INDEPENDENT_AMBULATORY_CARE_PROVIDER_SITE_OTHER): Payer: Medicare HMO | Admitting: Internal Medicine

## 2020-07-21 DIAGNOSIS — F5101 Primary insomnia: Secondary | ICD-10-CM | POA: Diagnosis not present

## 2020-07-21 DIAGNOSIS — K2100 Gastro-esophageal reflux disease with esophagitis, without bleeding: Secondary | ICD-10-CM

## 2020-07-21 DIAGNOSIS — F411 Generalized anxiety disorder: Secondary | ICD-10-CM | POA: Diagnosis not present

## 2020-07-21 DIAGNOSIS — G40309 Generalized idiopathic epilepsy and epileptic syndromes, not intractable, without status epilepticus: Secondary | ICD-10-CM | POA: Diagnosis not present

## 2020-07-21 DIAGNOSIS — E782 Mixed hyperlipidemia: Secondary | ICD-10-CM

## 2020-07-21 DIAGNOSIS — E440 Moderate protein-calorie malnutrition: Secondary | ICD-10-CM

## 2020-07-21 DIAGNOSIS — G4700 Insomnia, unspecified: Secondary | ICD-10-CM | POA: Insufficient documentation

## 2020-07-21 DIAGNOSIS — I10 Essential (primary) hypertension: Secondary | ICD-10-CM | POA: Diagnosis not present

## 2020-07-21 DIAGNOSIS — R7303 Prediabetes: Secondary | ICD-10-CM | POA: Diagnosis not present

## 2020-07-21 DIAGNOSIS — J410 Simple chronic bronchitis: Secondary | ICD-10-CM | POA: Diagnosis not present

## 2020-07-21 DIAGNOSIS — M1990 Unspecified osteoarthritis, unspecified site: Secondary | ICD-10-CM

## 2020-07-21 DIAGNOSIS — E46 Unspecified protein-calorie malnutrition: Secondary | ICD-10-CM | POA: Insufficient documentation

## 2020-07-21 MED ORDER — PREDNISONE 10 MG PO TABS: ORAL_TABLET | ORAL | 0 refills | Status: DC

## 2020-07-21 MED ORDER — MIRTAZAPINE 7.5 MG PO TABS: 7.5000 mg | ORAL_TABLET | Freq: Every day | ORAL | 2 refills | Status: DC

## 2020-07-21 NOTE — Patient Instructions (Signed)

## 2020-07-21 NOTE — Assessment & Plan Note (Signed)
Continue Dilantin

## 2020-07-21 NOTE — Progress Notes (Signed)
Subjective:    Patient ID: Elijah Mcfarland, male    DOB: [birth year], 70 y.o.   MRN: [id number]  HPI  Patient presents to clinic today for 10-month follow-up of chronic conditions.  Macular Degeneration: Legally blind.  He no longer follows with ophthalmology.  Anxiety: Chronic managed on Escitalopram, Buspirone and Xanax.  He is not currently seeing a therapist.  He denies depression, SI/HI.  He is due for CSA today.  OA: Mainly in his shoulder, back and knees.  He takes Tylenol as needed with some relief of symptoms.  He does follow with orthopedics.  COPD: He reports chronic shortness of breath but denies chronic cough.  He is taking Trelegy and Albuterol as prescribed. He is having trouble affording his medications.  He does wear oxygen at night.  PFTs from 03/2013 reviewed.  He does follow with pulmonology.  He does continue to smoke.  Seizure Disorder: No recent seizures on Dilantin.  He no longer follows with neurology.  HTN: His BP today is 133/79.  He is taking Losartan as prescribed.  ECG from 02/2018 reviewed.  Prediabetes: His last A1c was 5.6%, 01/2020.  He is not taking any oral diabetic medication at this time.  He does not check his sugars.  GERD: With history of GI bleed.  He is no longer taking Pantoprazole.  Upper GI from 02/2018 reviewed.  He no longer follows with GI.  HLD: His last LDL was 94, triglycerides 130, 01/2020.  He denies myalgias on Atorvastatin.  He tries to consume a low-fat diet.  Review of Systems  Past Medical History:  Diagnosis Date  . Chronic pain syndrome    Left knee  . COPD (chronic obstructive pulmonary disease) ([place])   . DDD (degenerative disc disease), lumbar 11/2008   L5-S1 on plain film  . Emphysema lung ([place])   . GAD (generalized anxiety disorder)   . GERD (gastroesophageal reflux disease)   . History of multiple pulmonary nodules 07/2010   Follow up CT scans showed resolution by 03/2011--NO MALIGNANCY.  Marland Kitchen History of pneumonia 06/2010   . HTN (hypertension) 01/07/2014  . Osteoarthritis of left knee    + past injury of this knee--tibia fracture?  +left leg shorter than right  . Seizure ([place])   . Seizure disorder ([place])    3 total seizures (first was 1995); neuro eval done and recommended lifetime phenytoin (he had a seizure after coming off the med in the late 90s  . Tobacco dependence     Current Outpatient Medications  Medication Sig Dispense Refill  . acetaminophen (TYLENOL) 325 MG tablet Take 2 tablets (650 mg total) by mouth every 6 (six) hours as needed for mild pain or headache (or Fever >/= 101). 30 tablet 2  . Acetaminophen-Codeine 300-30 MG tablet Take 1 tablet by mouth daily as needed for pain. 30 tablet 0  . albuterol (PROVENTIL HFA;VENTOLIN HFA) 108 (90 Base) MCG/ACT inhaler Inhale 2 puffs into the lungs every 6 (six) hours as needed for wheezing or shortness of breath. 1 Inhaler 2  . ALPRAZolam (XANAX) 0.5 MG tablet TAKE 1 TABLET BY MOUTH THREE TIMES DAILY 90 tablet 0  . atorvastatin (LIPITOR) 20 MG tablet TAKE 1 TABLET EVERY DAY 90 tablet 0  . citalopram (CELEXA) 40 MG tablet TAKE 1 TABLET EVERY DAY 90 tablet 0  . diphenhydrAMINE (BENADRYL) 25 MG tablet Take 25 mg by mouth every 6 (six) hours as needed.    . Fluticasone-Umeclidin-Vilant (TRELEGY ELLIPTA) 200-62.5-25 MCG/INH AEPB Inhale 1  puff into the lungs daily. 14 each 0  . Fluticasone-Umeclidin-Vilant (TRELEGY ELLIPTA) 200-62.5-25 MCG/INH AEPB Inhale 1 puff into the lungs daily. 90 each 3  . losartan (COZAAR) 50 MG tablet TAKE 1 TABLET EVERY DAY 90 tablet 0  . ondansetron (ZOFRAN) 4 MG tablet Take 1 tablet (4 mg total) by mouth every 6 (six) hours as needed for nausea or vomiting. 20 tablet 0  . OXYGEN Inhale into the lungs at bedtime.    . phenytoin (DILANTIN) 100 MG ER capsule TAKE 3 CAPSULES EVERY DAY 270 capsule 0   No current facility-administered medications for this visit.    Allergies  Allergen Reactions  . Nsaids Other (See Comments)    GI  bleed and vomiting    Family History  Problem Relation Age of Onset  . Heart disease Mother   . Heart disease Father   . Cancer Sister   . Stroke Neg Hx     Social History   Socioeconomic History  . Marital status: Married    Spouse name: Not on file  . Number of children: Not on file  . Years of education: Not on file  . Highest education level: Not on file  Occupational History  . Not on file  Tobacco Use  . Smoking status: Current Every Day Smoker    Packs/day: 3.00    Years: 51.00    Pack years: 153.00    Types: Cigarettes  . Smokeless tobacco: Never Used  . Tobacco comment: 1 ppd/ 05/01/2020  Vaping Use  . Vaping Use: Never used  Substance and Sexual Activity  . Alcohol use: No    Alcohol/week: 0.0 standard drinks  . Drug use: Not Currently    Types: Marijuana    Comment: 2 month  . Sexual activity: Yes    Partners: Male  Other Topics Concern  . Not on file  Social History Narrative   Married, 2 grown children   Eighth grade education.  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   Originally from [place].   Tobacco 100 pack-yr hx, still smoking as of 10/2011 (1 pack per day).   Denies alcohol or drug use.   No exercise.   Social Determinants of Health   Financial Resource Strain: Not on file  Food Insecurity: Not on file  Transportation Needs: Not on file  Physical Activity: Not on file  Stress: Not on file  Social Connections: Not on file  Intimate Partner Violence: Not on file     Constitutional: Pt reports unintentional weight loss. Denies fever, malaise, fatigue, headache .  HEENT: Pt reports difficulty with vision. Denies eye pain, eye redness, ear pain, ringing in the ears, wax buildup, runny nose, nasal congestion, bloody nose, or sore throat. Respiratory: Pt reports shortness of breath. Denies difficulty breathing, cough or sputum production.   Cardiovascular: Denies chest pain, chest tightness, palpitations or swelling in  the hands or feet.  Gastrointestinal: Denies abdominal pain, bloating, constipation, diarrhea or blood in the stool.  GU: Denies urgency, frequency, pain with urination, burning sensation, blood in urine, odor or discharge. Musculoskeletal: Pt reports intermittent joint pain. Denies decrease in range of motion, difficulty with gait, muscle pain or joint swelling.  Skin: Denies redness, rashes, lesions or ulcercations.  Neurological: Patient reports insomnia.  Denies dizziness, difficulty with memory, difficulty with speech or problems with balance and coordination.  Psych: Pt has a history of anxiety. Denies depression, SI/HI.  No other specific complaints in a complete review of  systems (except as listed in HPI above).     Objective:   Physical Exam BP 133/79 (BP Location: Right Arm, Patient Position: Sitting, Cuff Size: Normal)   Pulse 76   Temp 97.8 F (36.6 C) (Temporal)   Ht 5\' 8"  (1.727 m)   Wt 126 lb 6.4 oz (57.3 kg)   BMI 19.22 kg/m   Wt Readings from Last 3 Encounters:  05/01/20 140 lb 3.2 oz (63.6 kg)  03/11/20 136 lb (61.7 kg)  02/27/20 110 lb (49.9 kg)    General: Appears his stated age, chronically ill-appearing, in NAD. Skin: Warm, dry and intact. No ulcerations noted. HEENT: Head: normal shape and size;  Neck:  Neck supple, trachea midline. No masses, lumps or thyromegaly present.  Cardiovascular: Normal rate and rhythm. S1,S2 noted.  No murmur, rubs or gallops noted. No JVD or BLE edema. No carotid bruits noted. Pulmonary/Chest: Normal effort and positive vesicular breath sounds. No respiratory distress. No wheezes, rales or ronchi noted.  Abdomen:  Normal bowel sounds.  Musculoskeletal: Normal external rotation of the right shoulder.  Pain with internal rotation of the right shoulder.  Negative drop can test on the right.  No difficulty with gait.  Neurological: Alert and oriented.   Psychiatric: Mood and affect normal.  Mildly anxious appearing. Judgment and  thought content normal.     BMET    Component Value Date/Time   NA 138 02/05/2020 1252   K 4.4 02/05/2020 1252   CL 102 02/05/2020 1252   CO2 31 02/05/2020 1252   GLUCOSE 78 02/05/2020 1252   BUN 11 02/05/2020 1252   CREATININE 0.80 02/05/2020 1252   CREATININE 0.82 11/17/2018 1554   CALCIUM 9.6 02/05/2020 1252   GFRNONAA >60 03/10/2018 0506   GFRAA >60 03/10/2018 0506    Lipid Panel     Component Value Date/Time   CHOL 182 02/05/2020 1252   TRIG 130.0 02/05/2020 1252   HDL 62.20 02/05/2020 1252   CHOLHDL 3 02/05/2020 1252   VLDL 26.0 02/05/2020 1252   LDLCALC 94 02/05/2020 1252   LDLCALC 127 (H) 11/17/2018 1554    CBC    Component Value Date/Time   WBC 9.7 02/05/2020 1252   RBC 4.51 02/05/2020 1252   HGB 13.9 02/05/2020 1252   HCT 42.0 02/05/2020 1252   PLT 222.0 02/05/2020 1252   MCV 93.2 02/05/2020 1252   MCH 30.8 11/17/2018 1554   MCHC 33.1 02/05/2020 1252   RDW 14.0 02/05/2020 1252   LYMPHSABS 1.0 03/06/2018 1235   MONOABS 1.0 03/06/2018 1235   EOSABS 0.3 03/06/2018 1235   BASOSABS 0.0 03/06/2018 1235    Hgb A1C Lab Results  Component Value Date   HGBA1C 5.6 02/05/2020           Assessment & Plan:    Webb Silversmith, NP This visit occurred during the SARS-CoV-2 public health emergency.  Safety protocols were in place, including screening questions prior to the visit, additional usage of staff PPE, and extensive cleaning of exam room while observing appropriate contact time as indicated for disinfecting solutions.

## 2020-07-21 NOTE — Assessment & Plan Note (Signed)
No issues off meds °Will monitor °

## 2020-07-21 NOTE — Assessment & Plan Note (Signed)
Encourage low-carb diet

## 2020-07-21 NOTE — Assessment & Plan Note (Signed)
We will trial Remeron 7.5 mg p.o. nightly

## 2020-07-21 NOTE — Assessment & Plan Note (Signed)
Encouraged him to consume a low-fat diet Continue Atorvastatin

## 2020-07-21 NOTE — Assessment & Plan Note (Signed)
We will trial Remeron 7.5 mg p.o. nightly He continues to drink Ensure and boost daily Advised him to obtain a scale, monitor weights monthly and update me

## 2020-07-21 NOTE — Assessment & Plan Note (Signed)
Rx for Pred taper for symptom management He will follow back up with orthopedics

## 2020-07-21 NOTE — Assessment & Plan Note (Signed)
Controlled on Losartan Reinforced DASH diet

## 2020-07-21 NOTE — Assessment & Plan Note (Signed)
Continue Escitalopram, Buspirone and Xanax CSA today

## 2020-07-21 NOTE — Assessment & Plan Note (Signed)
Continue Trelegy and Albuterol Encourage smoking cessation Referral to community care coordination to help with medication management

## 2020-07-28 ENCOUNTER — Telehealth: Payer: Medicare HMO

## 2020-07-28 DIAGNOSIS — J449 Chronic obstructive pulmonary disease, unspecified: Secondary | ICD-10-CM | POA: Diagnosis not present

## 2020-08-08 ENCOUNTER — Telehealth: Payer: Self-pay | Admitting: Pulmonary Disease

## 2020-08-08 ENCOUNTER — Ambulatory Visit (INDEPENDENT_AMBULATORY_CARE_PROVIDER_SITE_OTHER): Payer: Medicare HMO | Admitting: Pharmacist

## 2020-08-08 DIAGNOSIS — J410 Simple chronic bronchitis: Secondary | ICD-10-CM

## 2020-08-08 DIAGNOSIS — I10 Essential (primary) hypertension: Secondary | ICD-10-CM

## 2020-08-08 MED ORDER — BREZTRI AEROSPHERE 160-9-4.8 MCG/ACT IN AERO: 2.0000 | INHALATION_SPRAY | Freq: Two times a day (BID) | RESPIRATORY_TRACT | 3 refills | Status: DC

## 2020-08-08 NOTE — Telephone Encounter (Signed)
Called and spoke with pt about the info received from pharmacist and stated to him after discussing with Dr. Patsey Berthold that she is fine with [place] switching  him to [facility]. Pt verbalized understanding. Pt said he still had some Trelegy left and wanted to have Rx for [facility] sent to [facility] mail order pharmacy so I have sent in a 67-month supply to [facility] for pt. Nothing further needed.

## 2020-08-08 NOTE — Chronic Care Management (AMB) (Signed)
Chronic Care Management Pharmacy Note  08/08/2020 Name:  Elijah Mcfarland MRN:  [id number] DOB:  May 13, 1950   Subjective: Elijah Mcfarland is an 70 y.o. year old male who is a primary patient of Elijah Fenton, NP.  The CCM team was consulted for assistance with disease management and care coordination needs.    Engaged with patient by telephone for initial visit in response to provider referral for pharmacy case management and/or care coordination services.   Consent to Services:  The patient was given the following information about Chronic Care Management services today, agreed to services, and gave verbal consent: 1. CCM service includes personalized support from designated clinical staff supervised by the primary care provider, including individualized plan of care and coordination with other care providers 2. 24/7 contact phone numbers for assistance for urgent and routine care needs. 3. Service will only be billed when office clinical staff spend 20 minutes or more in a month to coordinate care. 4. Only one practitioner may furnish and bill the service in a calendar month. 5.The patient may stop CCM services at any time (effective at the end of the month) by phone call to the office staff. 6. The patient will be responsible for cost sharing (co-pay) of up to 20% of the service fee (after annual deductible is met). Patient agreed to services and consent obtained.  Patient Care Team: Elijah Fenton, NP as PCP - General (Internal Medicine) [place] Cun[place] Elijah Mcfarland, [place] as Pharmacist  Recent office visits: Office Visit with PCP on 6/6 for follow up  Recent consult visits: Office Visit with Elijah Mcfarland on 3/17  Hospital visits: Patient admitted to [facility] on 02/27/2020 for colonoscopy  Objective:  Social History   Tobacco Use  Smoking Status Every Day   Packs/day: 3.00   Years: 51.00   Pack years: 153.00   Types: Cigarettes  Smokeless  Tobacco Never  Tobacco Comments   1 ppd/ 05/01/2020   BP Readings from Last 3 Encounters:  07/21/20 133/79  05/01/20 132/72  03/11/20 128/76   Pulse Readings from Last 3 Encounters:  07/21/20 76  05/01/20 61  03/11/20 71   Wt Readings from Last 3 Encounters:  07/21/20 126 lb 6.4 oz (57.3 kg)  05/01/20 140 lb 3.2 oz (63.6 kg)  03/11/20 136 lb (61.7 kg)    Assessment: Review of patient past medical history, allergies, medications, health status, including review of consultants reports, laboratory and other test data, was performed as part of comprehensive evaluation and provision of chronic care management services.   SDOH:  (Social Determinants of Health) assessments and interventions performed: none   CCM Care Plan  Allergies  Allergen Reactions   Nsaids Other (See Comments)    GI bleed and vomiting    Medications Reviewed Today     Reviewed by Elijah Fenton, NP (Nurse Practitioner) on 07/21/20 at 1055  Med List Status: <None>   Medication Order Taking? Sig Documenting Provider Last Dose Status Informant  acetaminophen (TYLENOL) 325 MG tablet [id number] Yes Take 2 tablets (650 mg total) by mouth every 6 (six) hours as needed for mild pain or headache (or Fever >/= 101). Roxan Hockey, MD Taking Active Self  albuterol (PROVENTIL HFA;VENTOLIN HFA) 108 (90 Base) MCG/ACT inhaler [id number] Yes Inhale 2 puffs into the lungs every 6 (six) hours as needed for wheezing or shortness of breath. Roxan Hockey, MD Taking Active Self  ALPRAZolam [name]) 0.5 MG tablet [id number] Yes TAKE 1 TABLET BY MOUTH  THREE TIMES DAILY Elijah Fenton, NP Taking Active   atorvastatin (LIPITOR) 20 MG tablet [id number] Yes TAKE 1 TABLET EVERY DAY Elijah Fenton, NP Taking Active   citalopram (CELEXA) 40 MG tablet [id number] Yes TAKE 1 TABLET EVERY DAY Baity, Coralie Keens, NP Taking Active   diphenhydrAMINE (BENADRYL) 25 MG tablet [id number] Yes Take 25 mg by mouth every 6 (six) hours as needed. [provider] Taking Active Self  Fluticasone-Umeclidin-Vilant (TRELEGY ELLIPTA) 200-62.5-25 MCG/INH AEPB [id number] Yes Inhale 1 puff into the lungs daily. Tyler Pita, MD Taking Active   losartan (COZAAR) 50 MG tablet [id number] Yes TAKE 1 TABLET EVERY DAY Elijah Fenton, NP Taking Active   OXYGEN [id number] Yes Inhale into the lungs at bedtime. 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   COPD (chronic obstructive pulmonary disease) ([place]) 04/25/2006   Arthritis 04/25/2006    Immunization History  Administered Date(s) Administered   Fluad Quad(high Dose 65+) 11/17/2018, 02/05/2020   H1N1 01/05/2008   Influenza Whole 11/07/2007   Influenza, High Dose Seasonal PF 03/03/2018   Influenza, Seasonal, Injecte, Preservative Fre 04/13/2013   Influenza,inj,Quad PF,6+ Mos 11/14/2014, 02/19/2016, 02/18/2017   Moderna SARS-COV2 Booster Vaccination 12/26/2019   Moderna Sars-Covid-2 Vaccination 04/22/2019, 05/20/2019   Pneumococcal Conjugate-13 11/14/2014, 02/05/2020   Pneumococcal Polysaccharide-23 11/07/2007, 02/19/2016, 03/03/2018   Td 09/06/2008    Conditions to be addressed/monitored: COPD  Care Plan : PharmD - Medication Assistance  Updates made by Vella Raring, RPH-CPP since 08/08/2020 12:00 AM     Problem: Disease Progression      Long-Range Goal: Disease Progression Prevented or  Minimized   Start Date: 08/08/2020  Expected End Date: 11/06/2020  This Visit's Progress: On track  Priority: High  Note:   Current Barriers:  Unable to independently afford treatment regimen Reports cost of Trelegy inhaler unaffordable as currently in coverage gap of [employer] Medicare Part D plan Patient is legally blind  Pharmacist Clinical Goal(s):  Over the next 90 days, patient will verbalize ability to afford treatment regimen through collaboration with PharmD and provider.    Interventions: 1:1 collaboration with Elijah Fenton, NP regarding development and update of comprehensive plan of care as evidenced by provider attestation and co-signature Inter-disciplinary care team collaboration (see longitudinal plan of care) Perform chart review. Patient seen for Office Visit with PCP on 6/6 for follow up. Patient advised: To trial Remeron 7.5 mg p.o. nightly To obtain a scale, monitor weights monthly and update me Referred to CCM Pharmacist for medication assistance/medication management Rx given for Pred taper for symptom management of arthritis Patient to follow back up with orthopedics Office Visit with Elliott on 3/17 Patient admitted to [facility] on 02/27/2020 for colonoscopy Speak with patient today regarding medication assistance need. Unable to complete medication review today Patient is legally blind; reports wife helps to manage his medications Will plan to complete medication  review and discuss medication management during previously scheduled appointment on 6/27  Medication Assistance: Reports that the cost of his Trelegy inhaler is no longer affordable since he has entered the coverage gap of his Medicare Part D plan Reports previously receiving Trelegy inhaler for free from the [employer] 2022 [place], but note program benefit not offered in plan coverage gap Patient currently has > 1 month supply of Trelegy  remaining Based on reported income, patient does not meet requirements for Extra Help subsidy Based on patient report, patient does not meet out of pocket expense requirement for [place] patient assistance program for Trelegy Based on reported income, patient would meet eligibility criteria for patient assistance program for therapeutic alternative to Trelegy, Breztri inhaler From review of notes from Pulmonology in chart, see that patient previously tried Breztri inhaler back in 05/2019, but was switched back to Trelegy inhaler as patient reported effect of Breztri inhaler did not last through the end of the day. Today patient reports that when he tried the [employer] inhaler last year, he used the inhaler just ONCE daily, rather than 2 puffs TWICE daily, and is interested in [employer] Pharmacist collaborating with Pulmonologist regarding retrying the [employer] inhaler, but using 2 puffs twice daily as directed, in place of Trelegy to improve medication affordability Place coordination of care call to [employer] to request provider consider retrying patient on Breztri in place of Trelegy for medication affordability (assistance program eligibility). Leave message with Lenna Sciara in the office  Patient Goals/Self-Care Activities Over the next 90 days, patient will:  - collaborate with provider on medication access solutions  Follow Up Plan: Telephone follow up appointment with care management team member scheduled for: 6/27 at 2:30 pm      Patient's preferred pharmacy is:  [facility] [place], [place] AT [address] [place] [address] Phone: [phone] Fax: [phone]  [facility] - [place], [address] [place] [address] Phone: [phone] Fax: [phone]   Follow Up:  Patient agrees to Care Plan and  Follow-up.  [name], PharmD, [place], CPP Clinical Pharmacist [facility] [phone]

## 2020-08-08 NOTE — Telephone Encounter (Signed)
Dr. Patsey Berthold, please advise if you would be ikay switching pt from Trelegy to [facility] since pt was not able to get pt assistance for [facility].

## 2020-08-08 NOTE — Telephone Encounter (Signed)
Yes that would be fine I have discussed it with the pharmacist so that he can get medication assistance.  Breztri 2 puffs twice a day, I believe the size of the inhalers 10.7 g or thereabouts and 11 refills.

## 2020-08-08 NOTE — Patient Instructions (Signed)
Visit Information   PATIENT GOALS:   Goals Addressed   Work with CM Pharmacist to find solution to medication cost need     Consent to CCM Services: Elijah Mcfarland was given information about Chronic Care Management services today including:  CCM service includes personalized support from designated clinical staff supervised by his physician, including individualized plan of care and coordination with other care providers 24/7 contact phone numbers for assistance for urgent and routine care needs. Service will only be billed when office clinical staff spend 20 minutes or more in a month to coordinate care. Only one practitioner may furnish and bill the service in a calendar month. The patient may stop CCM services at any time (effective at the end of the month) by phone call to the office staff. The patient will be responsible for cost sharing (co-pay) of up to 20% of the service fee (after annual deductible is met).  Patient agreed to services and verbal consent obtained.   The patient verbalized understanding of instructions, educational materials, and care plan provided today and declined offer to receive copy of patient instructions, educational materials, and care plan.   Telephone follow up appointment with care management team member scheduled for: 6/27 at 2:30 pm  [name], PharmD, [place], CPP Clinical Pharmacist [place] [phone]   CLINICAL CARE PLAN: Patient Care Plan: PharmD - Medication Assistance     Problem Identified: Disease Progression      Long-Range Goal: Disease Progression Prevented or Minimized   Start Date: 08/08/2020  Expected End Date: 11/06/2020  This Visit's Progress: On track  Priority: High  Note:   Current Barriers:  Unable to independently afford treatment regimen Reports cost of Trelegy inhaler unaffordable as currently in coverage gap of [employer] Medicare Part D plan Patient is legally  blind  Pharmacist Clinical Goal(s):  Over the next 90 days, patient will verbalize ability to afford treatment regimen through collaboration with PharmD and provider.    Interventions: 1:1 collaboration with [name], NP regarding development and update of comprehensive plan of care as evidenced by provider attestation and co-signature Inter-disciplinary care team collaboration (see longitudinal plan of care) Perform chart review. Patient seen for Office Visit with PCP on 6/6 for follow up. Patient advised: To trial Remeron 7.5 mg p.o. nightly To obtain a scale, monitor weights monthly and update me Referred to CCM Pharmacist for medication assistance/medication management Rx given for Pred taper for symptom management of arthritis Patient to follow back up with orthopedics Office Visit with [place] on 3/17 Patient admitted to [facility] on 02/27/2020 for colonoscopy Speak with patient today regarding medication assistance need.  Patient is legally blind; reports wife helps to manage his medications Will plan to complete medication review and discuss medication management during previously scheduled appointment on 6/27  Medication Assistance: Reports that the cost of his Trelegy inhaler is no longer affordable since he has entered the coverage gap of his Medicare Part D plan Reports previously receiving Trelegy inhaler for free from the [employer] 2022 [place], but note program benefit not offered in plan coverage gap Patient currently has > 1 month supply of Trelegy remaining Based on reported income, patient does not meet requirements for Extra Help subsidy Based on patient report, patient does not meet out of pocket expense requirement for [place] patient assistance program for Trelegy Based on reported income, patient would meet eligibility criteria for patient assistance program for therapeutic alternative  to Trelegy,  Breztri inhaler From review of notes from Pulmonology in chart, see that patient previously tried Breztri inhaler back in 05/2019, but was switched back to Trelegy inhaler as patient reported effect of Breztri inhaler did not last through the end of the day. Today patient reports that when he tried the [employer] inhaler last year, he used the inhaler just ONCE daily, rather than 2 puffs TWICE daily, and is interested in [employer] Pharmacist collaborating with Pulmonologist regarding retrying the [employer] inhaler, but using 2 puffs twice daily as directed, in place of Trelegy to improve medication affordability Place coordination of care call to [employer] to request provider consider retrying patient on Breztri in place of Trelegy for medication affordability (assistance program eligibility). Leave message with Lenna Sciara in the office  Patient Goals/Self-Care Activities Over the next 90 days, patient will:  - collaborate with provider on medication access solutions  Follow Up Plan: Telephone follow up appointment with care management team member scheduled for: 6/27 at 2:30 pm

## 2020-08-11 ENCOUNTER — Ambulatory Visit: Payer: Medicare HMO | Admitting: Pharmacist

## 2020-08-11 ENCOUNTER — Telehealth: Payer: Self-pay | Admitting: Pharmacist

## 2020-08-11 DIAGNOSIS — J449 Chronic obstructive pulmonary disease, unspecified: Secondary | ICD-10-CM

## 2020-08-11 DIAGNOSIS — I10 Essential (primary) hypertension: Secondary | ICD-10-CM

## 2020-08-11 DIAGNOSIS — J410 Simple chronic bronchitis: Secondary | ICD-10-CM

## 2020-08-11 NOTE — Patient Instructions (Signed)
Visit Information  PATIENT GOALS:  Goals Addressed             This Visit's Progress    Pharmacy Goals       Please watch for the patient assistance application in the mail, complete and then mail back to [facility] in return envelope provided (along with copy of financial document).  Feel free to call me with any questions or concerns. I look forward to our next call!  [name], PharmD, [place], CPP Clinical Pharmacist [facility] [phone]         The patient verbalized understanding of instructions, educational materials, and care plan provided today and declined offer to receive copy of patient instructions, educational materials, and care plan.   Telephone follow up appointment with care management team member scheduled for: 7/27 at 2:30 pm

## 2020-08-11 NOTE — Telephone Encounter (Signed)
Good afternoon! I follow up with patient today regarding recommendation from Dr. Patsey Berthold for him to use spacer when starts on Breztri inhaler. Patient advised he does not have a spacer at home. Would you please send Rx for spacer to patient's [place]?  Also, patient's current albuterol inhaler and Rx are expired, would you please renew patient's albuterol Rx?  Thank you!   [name], PharmD, [place], CPP Clinical Pharmacist [facility] [phone]

## 2020-08-11 NOTE — Chronic Care Management (AMB) (Signed)
Chronic Care Management Pharmacy Note  08/11/2020 Name:  Elijah Mcfarland MRN:  [id number] DOB:  January 03, 1951   Subjective: Elijah Mcfarland is an 70 y.o. year old male who is a primary patient of Elijah Fenton, NP.  The CCM team was consulted for assistance with disease management and care coordination needs.    Engaged with patient and wife by telephone for follow up visit in response to provider referral for pharmacy case management and/or care coordination services.   Consent to Services:  The patient was given information about Chronic Care Management services, agreed to services, and gave verbal consent prior to initiation of services.  Please see initial visit note for detailed documentation.   Patient Care Team: Elijah Fenton, NP as PCP - General (Internal Medicine) Elijah Mcfarland, RPH-CPP as Pharmacist  Recent office visits: None  Hospital visits: Patient admitted [facility] on [facility]/27/2020 for colonoscopy  Objective:  Lab Results  Component Value Date   CREATININE 0.80 02/05/2020   CREATININE 0.81 03/26/2019   CREATININE 0.82 11/17/2018    Lab Results  Component Value Date   HGBA1C 5.6 02/05/2020   Last diabetic Eye exam:  Lab Results  Component Value Date/Time   HMDIABEYEEXA No Retinopathy 07/17/2015 12:00 AM    Last diabetic Foot exam: No results found for: HMDIABFOOTEX      Component Value Date/Time   CHOL 182 02/05/2020 1252   TRIG 130.0 02/05/2020 1252   HDL 62.20 02/05/2020 1252   CHOLHDL 3 02/05/2020 1252   VLDL 26.0 02/05/2020 1252   LDLCALC 94 02/05/2020 1252   LDLCALC 127 (H) 11/17/2018 1554    Hepatic Function Latest Ref Rng & Units 02/05/2020 03/26/2019 11/17/2018  Total Protein 6.0 - 8.3 g/dL 7.4 6.7 6.9  Albumin 3.5 - 5.2 g/dL 4.7 4.2 -  AST 0 - 37 U/L _0 ALT 0 - 53 U/L _1 Alk Phosphatase 39 - 117 U/L 66 69 -  Total Bilirubin 0.2 - 1.2 mg/dL 0.4 0.3 0.3   Social History   Tobacco Use   Smoking Status Every Day   Packs/day: 3.00   Years: 51.00   Pack years: 153.00   Types: Cigarettes  Smokeless Tobacco Never  Tobacco Comments   1 ppd/ 05/01/2020   BP Readings from Last 3 Encounters:  07/21/20 133/79  05/01/20 132/72  03/11/20 128/76   Pulse Readings from Last 3 Encounters:  07/21/20 76  05/01/20 61  03/11/20 71   Wt Readings from Last 3 Encounters:  07/21/20 126 lb 6.4 oz (57.3 kg)  05/01/20 140 lb 3.2 oz (63.6 kg)  03/11/20 136 lb (61.7 kg)    Assessment: Review of patient past medical history, allergies, medications, health status, including review of consultants reports, laboratory and other test data, was performed as part of comprehensive evaluation and provision of chronic care management services.   SDOH:  (Social Determinants of Health) assessments and interventions performed: none   CCM Care Plan  Allergies  Allergen Reactions   Nsaids Other (See Comments)    GI bleed and vomiting    Medications Reviewed Today     Reviewed by Elijah Mcfarland, RPH-CPP (Pharmacist) on 08/11/20 at 1449  Med List Status: <None>   Medication Order Taking? Sig Documenting Provider Last Dose Status Informant  acetaminophen (TYLENOL) 325 MG tablet [id number] Yes Take 2 tablets (650 mg total) by mouth every 6 (six) hours as needed for mild pain or headache (or Fever >/=  101). Mcfarland, Cour[place], MD Taking Active Self  albuterol (PROVENTIL HFA;VENTOLIN HFA) 108 (90 Base) MCG/ACT inhaler [id number] Yes Inhale 2 puffs into the lungs every 6 (six) hours as needed for wheezing or shortness of breath. Elijah Hockey, MD Taking Active Self  ALPRAZolam Duanne Mcfarland) 0.5 MG tablet [id number] Yes TAKE 1 TABLET BY MOUTH THREE TIMES DAILY Elijah Fenton, NP Taking Active   atorvastatin (LIPITOR) 20 MG tablet [id number] Yes TAKE 1 TABLET EVERY DAY Elijah, Coralie Keens, NP Taking Active   Budeson-Glycopyrrol-Formoterol (BREZTRI AEROSPHERE) 160-9-4.8 MCG/ACT AERO [id number] No Inhale 2 puffs  into the lungs in the morning and at bedtime.  Patient not taking: Reported on 08/11/2020   Elijah Pita, MD Not Taking Active   citalopram (CELEXA) 40 MG tablet [id number] Yes TAKE 1 TABLET EVERY DAY Elijah Fenton, NP Taking Active   losartan (COZAAR) 50 MG tablet [id number] Yes TAKE 1 TABLET EVERY DAY Elijah, Coralie Keens, NP Taking Active   mirtazapine (REMERON) 7.5 MG tablet [id number] Yes Take 1 tablet (7.5 mg total) by mouth at bedtime. Elijah Fenton, NP Taking Active   OXYGEN [id number] Yes Inhale into the lungs at bedtime. [provider] Taking Active Self  phenytoin (DILANTIN) 100 MG ER capsule [id number] Yes TAKE 3 CAPSULES EVERY DAY Elijah, Coralie Keens, NP Taking Active   Med List Note Elijah Mcfarland, [place] 08/07/19 4193): UDS/CSA 07/06/19 repeat 10/06/2019            Patient Active Problem List   Diagnosis Date Noted   Insomnia 07/21/2020   Malnutrition (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   COPD (chronic obstructive pulmonary disease) ([place]) 04/25/2006   Arthritis 04/25/2006    Immunization History  Administered Date(s) Administered   Fluad Quad(high Dose 65+) 11/17/2018, 02/05/2020   H1N1 01/05/2008   Influenza Whole 11/07/2007   Influenza, High Dose Seasonal PF 03/03/2018   Influenza, Seasonal, Injecte, Preservative Fre 04/13/2013   Influenza,inj,Quad PF,6+ Mos 11/14/2014, 02/19/2016, 02/18/2017   Moderna SARS-COV2 Booster Vaccination 12/26/2019   Moderna Sars-Covid-2 Vaccination 04/22/2019, 05/20/2019   Pneumococcal Conjugate-13 11/14/2014, 02/05/2020   Pneumococcal Polysaccharide-23 11/07/2007, 02/19/2016, 03/03/2018   Td 09/06/2008    Conditions to be addressed/monitored: COPD, arthritis, HTN, HLD  Care Plan :  PharmD - Medication Assistance  Updates made by Elijah Mcfarland, RPH-CPP since 08/11/2020 12:00 AM     Problem: Disease Progression      Long-Range Goal: Disease Progression Prevented or Minimized   Start Date: 08/08/2020  Expected End Date: 11/06/2020  This Visit's Progress: On track  Recent Progress: On track  Priority: High  Note:   Current Barriers:  Unable to independently afford treatment regimen Reports cost of Trelegy inhaler unaffordable as currently in coverage gap of [employer] Medicare Part D plan Patient is legally blind  Pharmacist Clinical Goal(s):  Over the next 90 days, patient will verbalize ability to afford treatment regimen through collaboration with PharmD and provider.    Interventions: 1:1 collaboration with Elijah Fenton, NP regarding development and update of comprehensive plan of care as evidenced by provider attestation and co-signature Inter-disciplinary care team collaboration (see longitudinal plan of care) Perform chart review. Patient seen for Office Visit with PCP on 6/6 for follow up. Patient advised: To trial Remeron 7.5 mg p.o. nightly To obtain a scale, monitor  weights monthly and update me Referred to CCM Pharmacist for medication assistance/medication management Rx given for Pred taper for symptom management of arthritis Patient to follow back up with orthopedics Office Visit with [facility] on 3/17 Patient admitted to [facility] on 02/27/2020 for colonoscopy Patient reports wife helps him to manage his medications. Uses weekly pillbox Today reports appetite has improved since started on mirtazapine 7.5 mg nightly as directed by PCP. Denies side effects with current dose Reports continues to drink Boost daily Reports obtained scale as recommended and has gained 5 lbs over past month Reports has not followed up with Orthopedics due to cost concerns Comprehensive medication review performed with  patient and wife; medication list updated in electronic medical record Reports recently taking Claritin 10 mg daily for allergies, rather than diphenhydramine.  Encourage patient to continue to use OTC second-generation antihistamine medication such as generic Claritin for his allergies, rather than diphenhydramine (Benadryl) to reduce risk of sedation, confusion and other anticholinergic effects, particularly given patient's age ? 67. From review of chart, note patient's seizures controlled on current dose of Dilantin (seizure in 2015 attributed to patient being out of medication) and latest labs checked WNL. Note patient no longer follows with Neurology  Will send message to PCP to request provider check phenytoin level annually as part of patient's Dilantin monitoring (last phenytoin level found in local record was checked on 09/06/2008) Reports started taking Super Beta Prostate Advanced to help with urinary frequency ~15 days ago Reports supplement discussed with PCP before started. Reports urinary symptoms (frequency) improved since started taking; denies side effects Caution patient about limited data with supplements, as unlike prescription medications, these do not undergo FDA approval and there may be insufficient data to confirm safety, efficacy or potential for interactions  Medication Assistance: Reports that the cost of Trelegy inhaler is no longer affordable since he has entered the coverage gap of his Medicare Part D plan Reports previously receiving Trelegy inhaler for free from the [place], but note program benefit not offered in plan coverage gap Patient currently has > 1 month supply of Trelegy remaining Collaborated with Pulmonologist Dr. Patsey Berthold to request provider consider retrying patient on Breztri in place of Trelegy for medication affordability (assistance program eligibility). Provider agrees to retry patient on Breztri inhaler, to be  received via patient assistance program. CM Pharmacist to provide follow up to patient to counsel on medication adherence, to confirm patient using Breztri inhaler - 2 puffs TWICE daily as directed Provider recommends patient also use spacer with inhaler Will collaborate with [place] for aid to patient with applying for [facility] patient assistance Patient to follow up with Pulmonology office to request sample inhaler if runs out of supply before enrolled in program  COPD: Patient followed by [facility] [place] Current treatment: Trelegy Ellipta 1 puff daily Albuterol as needed COPD GOLD class III Provider has agreed to switch patient from Trelegy to [employer] inhaler - 2 puffs twice daily for medication affordability  Counsel patient on recommendation from Pulmonologist to use Breztri with a spacer. Patient denies having spacer Identify patient's current supply of albuterol and albuterol Rx are expired. Collaborate with [place] to request Rx for spacer and renewal of albuterol inhaler be sent to patient's local pharmacy  HTN: Current treatment:  Losartan 50 mg daily Reports has home upper arm BP monitor and checks daily at home, but denies keeping log of results Will mail patient  home BP log as requested   Patient Goals/Self-Care Activities Over the next 90 days, patient will:  - collaborate with provider on medication access solutions - attend medical appointment as scheduled  Next appointment with Pulmonology on 7/20  Follow Up Plan: Telephone follow up appointment with care management team member scheduled for: 7/27 at 2:30 pm       Patient's preferred pharmacy is:  [facility], [address] [place] [address] Phone: [phone] Fax: [phone]  [facility] - [address], [place] [address] Phone: [phone] Fax: [phone]  Uses pill box? Yes   Follow Up:  Patient agrees to Care Plan and Follow-up.  [name], PharmD, [place], CPP Clinical Pharmacist [facility] [phone]

## 2020-08-12 NOTE — Telephone Encounter (Signed)
Order placed to [facility].  Patient is aware and voiced his understanding.  Nothing further needed at this time.

## 2020-08-12 NOTE — Telephone Encounter (Signed)
Please send a DME request for a spacer for Elijah Mcfarland for use with inhaler.

## 2020-08-13 ENCOUNTER — Telehealth: Payer: Self-pay | Admitting: Pulmonary Disease

## 2020-08-13 MED ORDER — ALBUTEROL SULFATE HFA 108 (90 BASE) MCG/ACT IN AERS: 2.0000 | INHALATION_SPRAY | Freq: Four times a day (QID) | RESPIRATORY_TRACT | 2 refills | Status: DC | PRN

## 2020-08-13 NOTE — Telephone Encounter (Signed)
Rx for Ventolin has been sent to preferred pharmacy. °Patient is aware and voiced her understanding.  °Nothing further needed.  °

## 2020-08-14 ENCOUNTER — Telehealth: Payer: Self-pay | Admitting: Pharmacy Technician

## 2020-08-14 ENCOUNTER — Other Ambulatory Visit: Payer: Self-pay | Admitting: Internal Medicine

## 2020-08-14 DIAGNOSIS — Z596 Low income: Secondary | ICD-10-CM

## 2020-08-14 NOTE — Progress Notes (Signed)
Elijah Mcfarland)                                            Elijah Mcfarland    08/14/2020  Elijah Mcfarland 1950/07/17 [id number]                                      Medication Assistance Referral  Referral From: [facility] Embedded RPh Dorthula Perfect   Medication/Company: [name] / [place] Patient application portion:  Mailed Provider application portion: Faxed  to Dr. Vernard Gambles Provider address/fax verified via: Office website    [name], [place]  [phone]

## 2020-08-27 DIAGNOSIS — J449 Chronic obstructive pulmonary disease, unspecified: Secondary | ICD-10-CM | POA: Diagnosis not present

## 2020-08-29 ENCOUNTER — Telehealth: Payer: Self-pay | Admitting: Pulmonary Disease

## 2020-08-29 NOTE — Telephone Encounter (Signed)
Application has been signed and faxed back to fax number on form.  Received successful fax confirmation.  Nothing further needed at this time.

## 2020-08-29 NOTE — Telephone Encounter (Signed)
Application has been placed in Dr. Domingo Dimes look at folder for signature. Lm for [place].  Dr. Patsey Berthold, please advise. Thanks

## 2020-09-02 ENCOUNTER — Telehealth: Payer: Self-pay | Admitting: Pharmacy Technician

## 2020-09-02 DIAGNOSIS — Z596 Low income: Secondary | ICD-10-CM

## 2020-09-02 NOTE — Progress Notes (Signed)
[facility])                                            [place] Team    09/02/2020  Dewitt Judice Feb 11, 1951 [id number]  Received both patient and provider portion(s) of patient assistance application(s) for [facility]. Faxed completed application and required documents into [place].   Lenzie Sandler P. Jordanny Waddington, [place]  [phone]

## 2020-09-03 ENCOUNTER — Ambulatory Visit: Payer: Medicare HMO | Admitting: Pulmonary Disease

## 2020-09-03 ENCOUNTER — Encounter: Payer: Self-pay | Admitting: Pulmonary Disease

## 2020-09-03 ENCOUNTER — Other Ambulatory Visit: Payer: Self-pay

## 2020-09-03 VITALS — BP 110/60 | HR 70 | Temp 97.5°F | Ht 68.0 in | Wt 128.0 lb

## 2020-09-03 DIAGNOSIS — Z9989 Dependence on other enabling machines and devices: Secondary | ICD-10-CM | POA: Diagnosis not present

## 2020-09-03 DIAGNOSIS — G4733 Obstructive sleep apnea (adult) (pediatric): Secondary | ICD-10-CM

## 2020-09-03 DIAGNOSIS — J449 Chronic obstructive pulmonary disease, unspecified: Secondary | ICD-10-CM | POA: Diagnosis not present

## 2020-09-03 DIAGNOSIS — F17210 Nicotine dependence, cigarettes, uncomplicated: Secondary | ICD-10-CM | POA: Diagnosis not present

## 2020-09-03 DIAGNOSIS — J441 Chronic obstructive pulmonary disease with (acute) exacerbation: Secondary | ICD-10-CM | POA: Diagnosis not present

## 2020-09-03 MED ORDER — PREDNISONE 10 MG (21) PO TBPK: ORAL_TABLET | ORAL | 0 refills | Status: DC

## 2020-09-03 MED ORDER — BREZTRI AEROSPHERE 160-9-4.8 MCG/ACT IN AERO: 2.0000 | INHALATION_SPRAY | Freq: Two times a day (BID) | RESPIRATORY_TRACT | 0 refills | Status: DC

## 2020-09-03 NOTE — Progress Notes (Signed)
Subjective:    Patient ID: Elijah Mcfarland, male    DOB: 30-Sep-1950, 70 y.o.   MRN: [id number] Chief Complaint  Patient presents with   Follow-up    COPD-  coughing, wheezing, sob with activity, yellow phlegm.    HPI Hilberto is a 70 year old current smoker (1 PPD) who presents for follow-up on COPD GOLD class III.  This is a scheduled visit.  He has chronic dyspnea.  He unfortunately continues to smoke 1 pack of cigarettes per day.  The patient has been on Trelegy 100/62.5/25 and notices that this helps however he still has difficulties with wheezing particularly towards the end of the day.  We had tried him previously on Breztri but he did not tolerate this medication well and felt that the Trelegy helped him more.  However when discussing with the patient he actually was only using 1 Breztri inhalation daily instead of the 2 twice a day as recommended.  He is now willing to try the [facility] again as he can likely get some assistance from the [place] and Me program.    Over the last 2 to 3 days has noted increasing shortness of breath, no fevers, chills or sweats.  Cough is white to occasionally yellow but no purulence to it.  No hemoptysis.  No chest pain.  Shortness of breath is at baseline to perhaps slightly more labored.  He was noted to have apnea for which he is on CPAP.  He is doing well with this.  Will obtain download but wife assures that he has been very compliant with the CPAP and actually likes it.  He is due for lung cancer screening CT in August.   Review of Systems A 10 point review of systems was performed and it is as noted above otherwise negative.  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   COPD (chronic obstructive pulmonary disease) ([place]) 04/25/2006   Arthritis 04/25/2006   Social History   Tobacco Use   Smoking status: Every Day    Packs/day: 3.00    Years: 51.00    Pack years: 153.00    Types: Cigarettes   Smokeless tobacco: Never   Tobacco comments:    1 ppd/ 09/03/2020  Substance Use Topics   Alcohol use: No    Alcohol/week: 0.0 standard drinks   Allergies  Allergen Reactions   Nsaids Other (See Comments)    GI bleed and vomiting   Current Meds  Medication Sig   acetaminophen (TYLENOL) 325 MG tablet Take 2 tablets (650 mg total) by mouth every 6 (six) hours as needed for mild pain or headache (or Fever >/= 101).   albuterol (VENTOLIN HFA) 108 (90 Base) MCG/ACT inhaler Inhale 2 puffs into the lungs every 6 (six) hours as needed for wheezing or shortness of breath.   ALPRAZolam (XANAX) 0.5 MG tablet TAKE 1 TABLET BY MOUTH THREE TIMES DAILY   atorvastatin (LIPITOR) 20 MG tablet TAKE 1 TABLET EVERY DAY   citalopram (CELEXA) 40 MG tablet TAKE 1 TABLET EVERY DAY   loratadine (CLARITIN) 10 MG tablet Take 10 mg by mouth daily.   losartan (COZAAR) 50 MG tablet TAKE 1 TABLET EVERY DAY   mirtazapine (REMERON) 7.5 MG  tablet Take 1 tablet (7.5 mg total) by mouth at bedtime.   Nutritional Supplements (NUTRITIONAL SUPPLEMENT PO) Take by mouth. Super Beta Prostate Advanced   OXYGEN Inhale into the lungs at bedtime.   phenytoin (DILANTIN) 100 MG ER capsule TAKE 3 CAPSULES EVERY DAY   TRELEGY ELLIPTA 100-62.5-25 MCG/INH AEPB    Immunization History  Administered Date(s) Administered   Fluad Quad(high Dose 65+) 11/17/2018, 02/05/2020   H1N1 01/05/2008   Influenza Whole 11/07/2007   Influenza, High Dose Seasonal PF 03/03/2018   Influenza, Seasonal, Injecte, Preservative Fre 04/13/2013   Influenza,inj,Quad PF,6+ Mos 11/14/2014, 02/19/2016, 02/18/2017   Moderna SARS-COV2 Booster Vaccination 12/26/2019   Moderna Sars-Covid-2  Vaccination 04/22/2019, 05/20/2019   Pneumococcal Conjugate-13 11/14/2014, 02/05/2020   Pneumococcal Polysaccharide-23 11/07/2007, 02/19/2016, 03/03/2018   Td 09/06/2008        Objective:   Physical Exam BP 110/60 (BP Location: Left Arm, Patient Position: Sitting, Cuff Size: Normal)   Pulse 70   Temp (!) 97.5 F (36.4 C) (Oral)   Ht 5\' 8"  (1.727 m)   Wt 128 lb (58.1 kg)   SpO2 96%   BMI 19.46 kg/m  GENERAL: Thin, well-developed male, no overt respiratory distress, mild chronic use of accessories, ambulatory. HEAD: Normocephalic, atraumatic. EYES: Pupils equal, round, reactive to light.  No scleral icterus. MOUTH: Nose/mouth/throat not examined due to masking requirements for COVID 19. NECK: Supple. No thyromegaly. No nodules. No JVD.  Trachea midline PULMONARY: Symmetrical air entry, some accessory muscle use, diffuse wheezes noted, no other adventitious sounds, breath sounds are coarse. CARDIOVASCULAR: S1 and S2. Regular rate and rhythm.  No overt murmurs, gallops or rubs noted. GASTROINTESTINAL: No distention.  Benign. MUSCULOSKELETAL: No joint deformity, no clubbing, no edema. NEUROLOGIC: No focal deficits, fluent speech, awake, alert. SKIN: Intact,warm,dry.  No rashes noted on limited exam. PSYCH: Normal mood and behavior     Assessment & Plan:     ICD-10-CM   1. COPD with acute exacerbation ([place])  J44.1    Does not appear to have acute infectious process Prednisone taper pack Needs to quit smoking    2. Stage 3 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 (HCC)  J44.9    Trial of Breztri 2 puffs twice a day Previously failed due to using too little medication This may help with cost for the patient    3. OSA on CPAP  G47.33    Z99.89    Continue CPAP CPAP is auto 5 to 20 cm H2O    4. Tobacco dependence due to cigarettes  F17.210    Patient counseled regards to discontinuation of smoking Total counseling time 3 to 5 minutes Enrolled in lung cancer screening     Meds  ordered this encounter  Medications   Budeson-Glycopyrrol-Formoterol (BREZTRI AEROSPHERE) 160-9-4.8 MCG/ACT AERO    Sig: Inhale 2 puffs into the lungs in the morning and at bedtime.    Dispense:  5.9 g    Refill:  0    Order Specific Question:   Lot Number?    Answer:   [id number]    Order Specific Question:   Expiration Date?    Answer:   01/16/2023    Order Specific Question:   Manufacturer?    Answer:   [employer] [[id number]]    Order Specific Question:   Quantity    Answer:   2   predniSONE (STERAPRED UNI-PAK 21 TAB) 10 MG (21) TBPK tablet    Sig: Take as directed in the package, this is a taper pack.  Dispense:  21 tablet    Refill:  0   Discussion:  Patient appears to be in mild COPD exacerbation.  Will proceed with giving him a prednisone taper pack.  He continues to smoke and was advised at length to discontinue smoking.  He is having difficulties getting Trelegy we will give him another trial of Breztri however he was instructed that he needs to use 2 puffs twice a day not 1 puff daily.  I suspect that this is what led to his prior failure on this medication.  The patient in follow-up in 3 months time he has however to call us should his current symptoms fail to improve or worsen in the interim.  Renold Don, MD [facility] [place] [facility]   *This note was dictated using voice recognition software/Dragon.  Despite best efforts to proofread, errors can occur which can change the meaning.  Any change was purely unintentional.

## 2020-09-03 NOTE — Patient Instructions (Signed)
Starting tomorrow we are switching your inhaler to [facility] 2 puffs twice a day.  Make sure you rinse your mouth well after you use it.  Again the proper dose for this medication is 2 puffs twice a day.   DO NOT TAKE TRELEGY WHILE YOU ARE TAKING THE BREZTRI.   We have given you samples that will last for approximately 1 month.   We will see you in follow-up in 3 months time call sooner should any new problems arise.

## 2020-09-04 ENCOUNTER — Telehealth: Payer: Self-pay

## 2020-09-04 NOTE — Telephone Encounter (Signed)
Received confirmation that AZ&Me application was accepted. Nothing further needed.

## 2020-09-05 ENCOUNTER — Encounter: Payer: Self-pay | Admitting: Pulmonary Disease

## 2020-09-10 ENCOUNTER — Ambulatory Visit (INDEPENDENT_AMBULATORY_CARE_PROVIDER_SITE_OTHER): Payer: Medicare HMO | Admitting: Pharmacist

## 2020-09-10 ENCOUNTER — Telehealth: Payer: Self-pay | Admitting: Pharmacy Technician

## 2020-09-10 DIAGNOSIS — E785 Hyperlipidemia, unspecified: Secondary | ICD-10-CM

## 2020-09-10 DIAGNOSIS — I10 Essential (primary) hypertension: Secondary | ICD-10-CM | POA: Diagnosis not present

## 2020-09-10 DIAGNOSIS — J449 Chronic obstructive pulmonary disease, unspecified: Secondary | ICD-10-CM

## 2020-09-10 DIAGNOSIS — Z596 Low income: Secondary | ICD-10-CM

## 2020-09-10 NOTE — Patient Instructions (Signed)
Visit Information  PATIENT GOALS:  Goals Addressed             This Visit's Progress    Pharmacy Goals       Please use your Breztri 2 puffs twice a day.  Make sure you rinse your mouth well after you use it.  Feel free to call me with any questions or concerns. I look forward to our next call!  Wallace Cullens, PharmD, Para [place], CPP Clinical Pharmacist [facility] [phone]        The patient verbalized understanding of instructions, educational materials, and care plan provided today and declined offer to receive copy of patient instructions, educational materials, and care plan.   Telephone follow up appointment with care management team member scheduled for: 8/31 at 2:30 pm

## 2020-09-10 NOTE — Chronic Care Management (AMB) (Signed)
Chronic Care Management Pharmacy Note  09/10/2020 Name:  Elijah Mcfarland MRN:  [id number] DOB:  [birth year]  Subjective: Elijah Mcfarland is an 70 y.o. year old male who is a primary patient of [name], NP.  The CCM team was consulted for assistance with disease management and care coordination needs.    Engaged with patient by telephone for follow up visit in response to provider referral for pharmacy case management and/or care coordination services.   Consent to Services:  The patient was given information about Chronic Care Management services, agreed to services, and gave verbal consent prior to initiation of services.  Please see initial visit note for detailed documentation.   Patient Care Team: [name], NP as PCP - General (Internal Medicine) Winfield Cunas Virl Diamond, RPH-CPP as Pharmacist   Recent consult visits: Office Visit with [place] on 7/20 for follow up/mild COPD exacerbation  Hospital visits: None in previous 6 months  Objective:  Lab Results  Component Value Date   CREATININE 0.80 02/05/2020   CREATININE 0.81 03/26/2019   CREATININE 0.82 11/17/2018    Lab Results  Component Value Date   HGBA1C 5.6 02/05/2020   Last diabetic Eye exam:  Lab Results  Component Value Date/Time   HMDIABEYEEXA No Retinopathy 07/17/2015 12:00 AM    Last diabetic Foot exam: No results found for: HMDIABFOOTEX      Component Value Date/Time   CHOL 182 02/05/2020 1252   TRIG 130.0 02/05/2020 1252   HDL 62.20 02/05/2020 1252   CHOLHDL 3 02/05/2020 1252   VLDL 26.0 02/05/2020 1252   LDLCALC 94 02/05/2020 1252   LDLCALC 127 (H) 11/17/2018 1554    Hepatic Function Latest Ref Rng & Units 02/05/2020 03/26/2019 11/17/2018  Total Protein 6.0 - 8.3 g/dL 7.4 6.7 6.9  Albumin 3.5 - 5.2 g/dL 4.7 4.2 -  AST 0 - 37 U/L 21 19 17   ALT 0 - 53 U/L 23 23 16   Alk Phosphatase 39 - 117 U/L 66 69 -  Total Bilirubin 0.2 - 1.2 mg/dL 0.4 0.3 0.3    Social  History   Tobacco Use  Smoking Status Every Day   Packs/day: 3.00   Years: 51.00   Pack years: 153.00   Types: Cigarettes  Smokeless Tobacco Never  Tobacco Comments   1 ppd/ 09/03/2020   BP Readings from Last 3 Encounters:  09/03/20 110/60  07/21/20 133/79  05/01/20 132/72   Pulse Readings from Last 3 Encounters:  09/03/20 70  07/21/20 76  05/01/20 61   Wt Readings from Last 3 Encounters:  09/03/20 128 lb (58.1 kg)  07/21/20 126 lb 6.4 oz (57.3 kg)  05/01/20 140 lb 3.2 oz (63.6 kg)    Assessment: Review of patient past medical history, allergies, medications, health status, including review of consultants reports, laboratory and other test data, was performed as part of comprehensive evaluation and provision of chronic care management services.   SDOH:  (Social Determinants of Health) assessments and interventions performed: none   CCM Care Plan  Allergies  Allergen Reactions   Nsaids Other (See Comments)    GI bleed and vomiting    Medications Reviewed Today     Reviewed by Vella Raring, RPH-CPP (Pharmacist) on 09/10/20 at 7  Med List Status: <None>   Medication Order Taking? Sig Documenting Provider Last Dose Status Informant  acetaminophen (TYLENOL) 325 MG tablet [id number]  Take 2 tablets (650 mg total) by mouth every 6 (six) hours as needed for mild  pain or headache (or Fever >/= 101). Roxan Hockey, MD  Active Self  albuterol (VENTOLIN HFA) 108 (90 Base) MCG/ACT inhaler [id number] Yes Inhale 2 puffs into the lungs every 6 (six) hours as needed for wheezing or shortness of breath. Tyler Pita, MD Taking Active   ALPRAZolam [name]) 0.5 MG tablet [id number]  TAKE 1 TABLET BY MOUTH THREE TIMES DAILY [name], NP  Active   atorvastatin (LIPITOR) 20 MG tablet [id number] Yes TAKE 1 TABLET EVERY DAY Baity, Coralie Keens, NP Taking Active   Budeson-Glycopyrrol-Formoterol (BREZTRI AEROSPHERE) 160-9-4.8 MCG/ACT AERO [id number] Yes Inhale 2 puffs into the  lungs in the morning and at bedtime. Tyler Pita, MD Taking Active   citalopram (CELEXA) 40 MG tablet [id number]  TAKE 1 TABLET EVERY DAY Baity, Coralie Keens, NP  Active   loratadine (CLARITIN) 10 MG tablet [id number]  Take 10 mg by mouth daily. [provider]  Active   losartan (COZAAR) 50 MG tablet [id number] Yes TAKE 1 TABLET EVERY DAY Baity, Coralie Keens, NP Taking Active   mirtazapine (REMERON) 7.5 MG tablet [id number] Yes Take 1 tablet (7.5 mg total) by mouth at bedtime. [name], NP Taking Active   Nutritional Supplements (NUTRITIONAL SUPPLEMENT PO) [id number]  Take by mouth. Super Beta Prostate Advanced [provider]  Active   OXYGEN [id number]  Inhale into the lungs at bedtime. [provider]  Active Self  phenytoin (DILANTIN) 100 MG ER capsule [id number]  TAKE 3 CAPSULES EVERY DAY Baity, Coralie Keens, NP  Active   predniSONE (STERAPRED UNI-PAK 21 TAB) 10 MG (21) TBPK tablet [id number] Yes Take as directed in the package, this is a taper pack. 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   COPD (chronic obstructive pulmonary disease) ([place]) 04/25/2006   Arthritis 04/25/2006    Immunization History  Administered Date(s) Administered   Fluad Quad(high Dose 65+) 11/17/2018, 02/05/2020   H1N1 01/05/2008   Influenza Whole 11/07/2007   Influenza, High Dose Seasonal PF 03/03/2018   Influenza, Seasonal, Injecte, Preservative Fre 04/13/2013   Influenza,inj,Quad PF,6+ Mos 11/14/2014, 02/19/2016, 02/18/2017    Moderna SARS-COV2 Booster Vaccination 12/26/2019   Moderna Sars-Covid-2 Vaccination 04/22/2019, 05/20/2019   Pneumococcal Conjugate-13 11/14/2014, 02/05/2020   Pneumococcal Polysaccharide-23 11/07/2007, 02/19/2016, 03/03/2018   Td 09/06/2008    Conditions to be addressed/monitored: COPD, arthritis, HTN, HLD  Care Plan : PharmD - Medication Assistance  Updates made by Vella Raring, RPH-CPP since 09/10/2020 12:00 AM     Problem: Disease Progression      Long-Range Goal: Disease Progression Prevented or Minimized   Start Date: 08/08/2020  Expected End Date: 11/06/2020  This Visit's Progress: On track  Recent Progress: On track  Priority: High  Note:   Current Barriers:  Unable to independently afford treatment regimen Reports cost of Trelegy inhaler unaffordable as currently in coverage gap of [employer] Medicare Part D plan Patient APPROVED for [name] patient assistance from AZ[employer]Me 09/04/20-02/14/21  Patient is legally blind  Pharmacist Clinical Goal(s):  Over the next 90 days, patient  will verbalize ability to afford treatment regimen through collaboration with PharmD and provider.    Interventions: 1:1 collaboration with [name], NP regarding development and update of comprehensive plan of care as evidenced by provider attestation and co-signature Inter-disciplinary care team collaboration (see longitudinal plan of care) Perform chart review Patient seen for Office Visit with [place] on 7/20 for follow up/mild COPD exacerbation. Provider advised patient: Start prednisone taper course Switch maintenance inhaler from Trelegy to [facility] 2 puffs twice a day Sample given in office  Medication Assistance: Per note from today from [place], patient APPROVED for [facility] patient assistance from AZ&Me 09/04/20-02/14/21 and medication should arrive to patient's home in next 7-10 business days Counsel on [place] OTC benefit for additional cost  savings  COPD: Patient followed by [facility] Report breathing since seen by Pulmonologist last week Current treatment: Breztri - 2 puffs twice daily Albuterol as needed Reports completes 6-day course of prednisone from Pulmonologist today Confirms Stopped taking Trelegy inhaler Confirms using Breztri inhaler twice daily (morning and evening) and rinsing mouth out after use Confirms received refill of albuterol inhaler Counseled patient on recommendation from Pulmonologist to use spacer with Breztri inhaler Collaborated with [place] on 6/27 to request spacer be ordered Today patient denies having been contacted about spacer, but states prefers to hold off on spacer for now. Will discuss again if breathing uncontrolled with current inhaler/administration technique   Patient Goals/Self-Care Activities Over the next 90 days, patient will:  - collaborate with provider on medication access solutions - attend medical appointment as scheduled  Next appointment with PCP on 12/13  Follow Up Plan: Telephone follow up appointment with care management team member scheduled for: 8/31 at 2:30 pm      Medication Assistance: Patient APPROVED for Breztri patient assistance from AZ&Me 09/04/20-02/14/21   Patient's preferred pharmacy is:  [facility], [address] [place] [address] Phone: [phone] Fax: [phone]  [facility] - [place], [place] [address] Phone: [phone] Fax: [phone]   Follow Up:  Patient agrees to Care Plan and Follow-up.  Wallace Cullens, PharmD, Para [place], CPP Clinical Pharmacist [facility] [phone]

## 2020-09-10 NOTE — Progress Notes (Signed)
[facility])                                            [place] Team    09/10/2020  Aser Nylund 05-Apr-1950 [id number]  Care coordination call placed to AZ[employer]ME in regards to [facility] application.  Spoke to [place] who informed patient was APPROVED 09/04/20-02/14/21. She informed patient should receive the medication in the next 7-10 business days and patient is set up for auto refills. Patient is aware to be expecting medication at his home.  Damarri Rampy P. Adelisa Satterwhite, [place]  [phone]

## 2020-09-12 ENCOUNTER — Other Ambulatory Visit: Payer: Self-pay | Admitting: Internal Medicine

## 2020-09-12 NOTE — Telephone Encounter (Signed)
Requested medication (s) are due for refill today -yes  Requested medication (s) are on the active medication list -yes  Future visit scheduled -yes  Last refill: 08/14/20  Notes to clinic: Request RF- non delegated Rx  Requested Prescriptions  Pending Prescriptions Disp Refills   ALPRAZolam (XANAX) 0.5 MG tablet [Pharmacy Med Name: ALPRAZOLAM 0.5MG  TABLETS] 90 tablet     Sig: TAKE 1 TABLET BY MOUTH THREE TIMES DAILY      There is no refill protocol information for this order        Requested Prescriptions  Pending Prescriptions Disp Refills   ALPRAZolam (XANAX) 0.5 MG tablet [Pharmacy Med Name: ALPRAZOLAM 0.5MG  TABLETS] 90 tablet     Sig: TAKE 1 TABLET BY MOUTH THREE TIMES DAILY      There is no refill protocol information for this order

## 2020-09-27 DIAGNOSIS — J449 Chronic obstructive pulmonary disease, unspecified: Secondary | ICD-10-CM | POA: Diagnosis not present

## 2020-10-12 ENCOUNTER — Other Ambulatory Visit: Payer: Self-pay | Admitting: Internal Medicine

## 2020-10-12 NOTE — Telephone Encounter (Signed)
Requested medication (s) are due for refill today: yes  Requested medication (s) are on the active medication list: yes  Last refill:  07/21/20 #30 2 RF  Future visit scheduled: yes  Notes to clinic:  phone visit w/ pharmacist 10/15/20- pt to discuss med compliance   Requested Prescriptions  Pending Prescriptions Disp Refills   mirtazapine (REMERON) 7.5 MG tablet [Pharmacy Med Name: MIRTAZAPINE 7.5MG  TABLETS] 30 tablet 2    Sig: TAKE 1 TABLET(7.5 MG) BY MOUTH AT BEDTIME     Psychiatry: Antidepressants - mirtazapine Passed - 10/12/2020  3:33 AM      Passed - AST in normal range and within 360 days    AST  Date Value Ref Range Status  02/05/2020 21 0 - 37 U/L Final          Passed - ALT in normal range and within 360 days    ALT  Date Value Ref Range Status  02/05/2020 23 0 - 53 U/L Final          Passed - Triglycerides in normal range and within 360 days    Triglycerides  Date Value Ref Range Status  02/05/2020 130.0 0.0 - 149.0 mg/dL Final    Comment:    Normal:  <150 mg/dLBorderline High:  150 - 199 mg/dL          Passed - Total Cholesterol in normal range and within 360 days    Cholesterol  Date Value Ref Range Status  02/05/2020 182 0 - 200 mg/dL Final    Comment:    ATP III Classification       Desirable:  < 200 mg/dL               Borderline High:  200 - 239 mg/dL          High:  > = 240 mg/dL          Passed - WBC in normal range and within 360 days    WBC  Date Value Ref Range Status  02/05/2020 9.7 4.0 - 10.5 K/uL Final          Passed - Valid encounter within last 6 months    Recent Outpatient Visits           2 months ago Anxiety state   [facility], Coralie Keens, NP       Future Appointments             In 3 months Baity, Coralie Keens, NP [facility], [facility]

## 2020-10-15 ENCOUNTER — Ambulatory Visit (INDEPENDENT_AMBULATORY_CARE_PROVIDER_SITE_OTHER): Payer: Medicare HMO | Admitting: Pharmacist

## 2020-10-15 DIAGNOSIS — I10 Essential (primary) hypertension: Secondary | ICD-10-CM

## 2020-10-15 DIAGNOSIS — J449 Chronic obstructive pulmonary disease, unspecified: Secondary | ICD-10-CM | POA: Diagnosis not present

## 2020-10-15 DIAGNOSIS — E785 Hyperlipidemia, unspecified: Secondary | ICD-10-CM | POA: Diagnosis not present

## 2020-10-15 NOTE — Patient Instructions (Signed)
Visit Information  PATIENT GOALS:  Goals Addressed             This Visit's Progress    Pharmacy Goals       Please use your Breztri 2 puffs twice a day.  Make sure you rinse your mouth well after you use it.  Feel free to call me with any questions or concerns. I look forward to our next call!   Wallace Cullens, PharmD, Para [place], CPP Clinical Pharmacist [facility] [phone]        The patient verbalized understanding of instructions, educational materials, and care plan provided today and declined offer to receive copy of patient instructions, educational materials, and care plan.   Telephone follow up appointment with care management team member scheduled for: 12/5 at 2:30 pm

## 2020-10-15 NOTE — Chronic Care Management (AMB) (Signed)
Chronic Care Management Pharmacy Note  10/15/2020 Name:  Elijah Mcfarland MRN:  [id number] DOB:  Feb 27, 1950   Subjective: Elijah Mcfarland is an 70 y.o. year old male who is a primary patient of [name], NP.  The CCM team was consulted for assistance with disease management and care coordination needs.    Engaged with patient by telephone for follow up visit in response to provider referral for pharmacy case management and/or care coordination services.   Consent to Services:  The patient was given information about Chronic Care Management services, agreed to services, and gave verbal consent prior to initiation of services.  Please see initial visit note for detailed documentation.   Patient Care Team: [name], NP as PCP - General (Internal Medicine) Curley Spice Virl Diamond, RPH-CPP as Pharmacist  Recent office visits: None  Hospital visits: None in previous 6 months  Objective:  Lab Results  Component Value Date   CREATININE 0.80 02/05/2020   CREATININE 0.81 03/26/2019   CREATININE 0.82 11/17/2018    Social History   Tobacco Use  Smoking Status Every Day   Packs/day: 3.00   Years: 51.00   Pack years: 153.00   Types: Cigarettes  Smokeless Tobacco Never  Tobacco Comments   1 ppd/ 09/03/2020   BP Readings from Last 3 Encounters:  09/03/20 110/60  07/21/20 133/79  05/01/20 132/72   Pulse Readings from Last 3 Encounters:  09/03/20 70  07/21/20 76  05/01/20 61   Wt Readings from Last 3 Encounters:  09/03/20 128 lb (58.1 kg)  07/21/20 126 lb 6.4 oz (57.3 kg)  05/01/20 140 lb 3.2 oz (63.6 kg)    Assessment: Review of patient past medical history, allergies, medications, health status, including review of consultants reports, laboratory and other test data, was performed as part of comprehensive evaluation and provision of chronic care management services.   SDOH:  (Social Determinants of Health) assessments and interventions performed:    CCM  Care Plan  Allergies  Allergen Reactions   Nsaids Other (See Comments)    GI bleed and vomiting    Medications Reviewed Today     Reviewed by Vella Raring, RPH-CPP (Pharmacist) on 09/10/20 at [place] List Status: <None>   Medication Order Taking? Sig Documenting Provider Last Dose Status Informant  acetaminophen (TYLENOL) 325 MG tablet [id number]  Take 2 tablets (650 mg total) by mouth every 6 (six) hours as needed for mild pain or headache (or Fever >/= 101). Roxan Hockey, MD  Active Self  albuterol (VENTOLIN HFA) 108 (90 Base) MCG/ACT inhaler [id number] Yes Inhale 2 puffs into the lungs every 6 (six) hours as needed for wheezing or shortness of breath. Tyler Pita, MD Taking Active   ALPRAZolam [name]) 0.5 MG tablet [id number]  TAKE 1 TABLET BY MOUTH THREE TIMES DAILY [name], NP  Active   atorvastatin (LIPITOR) 20 MG tablet [id number] Yes TAKE 1 TABLET EVERY DAY Baity, Coralie Keens, NP Taking Active   Budeson-Glycopyrrol-Formoterol (BREZTRI AEROSPHERE) 160-9-4.8 MCG/ACT AERO [id number] Yes Inhale 2 puffs into the lungs in the morning and at bedtime. Tyler Pita, MD Taking Active   citalopram (CELEXA) 40 MG tablet [id number]  TAKE 1 TABLET EVERY DAY Baity, Coralie Keens, NP  Active   loratadine (CLARITIN) 10 MG tablet [id number]  Take 10 mg by mouth daily. [provider]  Active   losartan (COZAAR) 50 MG tablet [id number] Yes TAKE 1 TABLET EVERY DAY Baity, Coralie Keens, NP Taking Active  mirtazapine (REMERON) 7.5 MG tablet [id number] Yes Take 1 tablet (7.5 mg total) by mouth at bedtime. [name], NP Taking Active   Nutritional Supplements (NUTRITIONAL SUPPLEMENT PO) [id number]  Take by mouth. Super Beta Prostate Advanced [provider]  Active   OXYGEN [id number]  Inhale into the lungs at bedtime. [provider]  Active Self  phenytoin (DILANTIN) 100 MG ER capsule [id number]  TAKE 3 CAPSULES EVERY DAY Baity, Coralie Keens, NP  Active   predniSONE  (STERAPRED UNI-PAK 21 TAB) 10 MG (21) TBPK tablet [id number] Yes Take as directed in the package, this is a taper pack. 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   COPD (chronic obstructive pulmonary disease) ([place]) 04/25/2006   Arthritis 04/25/2006    Immunization History  Administered Date(s) Administered   Fluad Quad(high Dose 65+) 11/17/2018, 02/05/2020   H1N1 01/05/2008   Influenza Whole 11/07/2007   Influenza, High Dose Seasonal PF 03/03/2018   Influenza, Seasonal, Injecte, Preservative Fre 04/13/2013   Influenza,inj,Quad PF,6+ Mos 11/14/2014, 02/19/2016, 02/18/2017   Moderna SARS-COV2 Booster Vaccination 12/26/2019   Moderna Sars-Covid-2 Vaccination 04/22/2019, 05/20/2019   Pneumococcal Conjugate-13 11/14/2014, 02/05/2020   Pneumococcal Polysaccharide-23 11/07/2007, 02/19/2016, 03/03/2018   Td 09/06/2008    Conditions to be addressed/monitored: COPD, arthritis, HTN, HLD  Care Plan : PharmD - Medication Assistance  Updates made by Rennis Petty, RPH-CPP since 10/15/2020 12:00 AM     Problem: Disease Progression      Long-Range Goal: Disease Progression Prevented or Minimized   Start Date: 08/08/2020  Expected End Date: 11/06/2020  This Visit's Progress: On track  Recent Progress: On track  Priority: High  Note:   Current Barriers:  Unable to independently afford treatment regimen Reports cost of Trelegy inhaler unaffordable as currently in coverage gap of [employer] Medicare Part D plan Patient APPROVED  for [name] patient assistance from [employer] 09/04/20-02/14/21  Patient is legally blind  Pharmacist Clinical Goal(s):  Over the next 90 days, patient will verbalize ability to afford treatment regimen through collaboration with PharmD and provider.   Interventions: 1:1 collaboration with [name], NP regarding development and update of comprehensive plan of care as evidenced by provider attestation and co-signature Inter-disciplinary care team collaboration (see longitudinal plan of care)  COPD: Patient followed by [facility] Current treatment: Breztri - 2 puffs twice daily Albuterol as needed Confirms using Breztri inhaler twice daily (morning and evening) and rinsing mouth out after use Reports symptoms remain well-controlled with [name]  Medication Assistance: Schedule appointment to follow up for [facility] patient assistance program re-enrollment  Patient Goals/Self-Care Activities Over the next 90 days, patient will:  - collaborate with provider on medication access solutions - attend medical appointment as scheduled  Next appointment with PCP on 12/13  Follow Up Plan: Telephone follow up appointment with care management team member scheduled for: 12/5 at 2:30 pm for medication assistance program renewal      Patient's preferred pharmacy is:  [facility] [place], [place]  AT [address] [place] [address] Phone: [phone] Fax: [phone]  [facility] ([facility] - [place], [place] [address] Phone: [phone] Fax: [phone]   Follow Up:  Patient agrees to Care Plan and Follow-up.  Wallace Cullens, PharmD, Para [place], CPP Clinical Pharmacist [facility] [phone]

## 2020-10-16 ENCOUNTER — Other Ambulatory Visit: Payer: Self-pay | Admitting: Internal Medicine

## 2020-10-16 NOTE — Telephone Encounter (Signed)
Requested medication (s) are due for refill today: yes  Requested medication (s) are on the active medication list: yes  Last refill:  09/12/20 #90 0 refills   Future visit scheduled: yes in 3 months  Notes to clinic:  not delegated per protocol     Requested Prescriptions  Pending Prescriptions Disp Refills   ALPRAZolam (XANAX) 0.5 MG tablet [Pharmacy Med Name: ALPRAZOLAM 0.5MG  TABLETS] 90 tablet     Sig: TAKE 1 TABLET BY MOUTH THREE TIMES DAILY     Not Delegated - Psychiatry:  Anxiolytics/Hypnotics Failed - 10/16/2020 12:29 PM      Failed - This refill cannot be delegated      Failed - Urine Drug Screen completed in last 360 days      Passed - Valid encounter within last 6 months    Recent Outpatient Visits           2 months ago Anxiety state   [facility], Coralie Keens, NP       Future Appointments             In 3 months Baity, Coralie Keens, NP [facility], [facility]

## 2020-10-28 DIAGNOSIS — J449 Chronic obstructive pulmonary disease, unspecified: Secondary | ICD-10-CM | POA: Diagnosis not present

## 2020-11-14 ENCOUNTER — Other Ambulatory Visit: Payer: Self-pay | Admitting: Internal Medicine

## 2020-11-14 NOTE — Telephone Encounter (Signed)
Requested medication (s) are due for refill today- yes  Requested medication (s) are on the active medication list -yes  Future visit scheduled -yes  Last refill: 10/16/20 #90  Notes to clinic: Request RF: non delegated Rx  Requested Prescriptions  Pending Prescriptions Disp Refills   ALPRAZolam (XANAX) 0.5 MG tablet [Pharmacy Med Name: ALPRAZOLAM 0.5MG  TABLETS] 90 tablet     Sig: TAKE 1 TABLET BY [place]     Not Delegated - Psychiatry:  Anxiolytics/Hypnotics Failed - 11/14/2020  9:00 AM      Failed - This refill cannot be delegated      Failed - Urine Drug Screen completed in last 360 days      Passed - Valid encounter within last 6 months    Recent Outpatient Visits           3 months ago [facility] Elijah Mcfarland, Coralie Keens, NP       Future Appointments             In 2 months Baity, Coralie Keens, NP [facility], [facility]               Requested Prescriptions  Pending Prescriptions Disp Refills   ALPRAZolam [name]) 0.5 MG tablet [Pharmacy Med Name: ALPRAZOLAM 0.5MG  TABLETS] 90 tablet     Sig: TAKE 1 TABLET BY MOUTH THREE TIMES DAILY     Not Delegated - Psychiatry:  Anxiolytics/Hypnotics Failed - 11/14/2020  9:00 AM      Failed - This refill cannot be delegated      Failed - Urine Drug Screen completed in last 360 days      Passed - Valid encounter within last 6 months    Recent Outpatient Visits           3 months ago Anxiety state   [facility] [place], Coralie Keens, NP       Future Appointments             In 2 months Baity, Coralie Keens, NP [facility], [facility]

## 2020-11-27 DIAGNOSIS — J449 Chronic obstructive pulmonary disease, unspecified: Secondary | ICD-10-CM | POA: Diagnosis not present

## 2020-12-08 ENCOUNTER — Other Ambulatory Visit: Payer: Self-pay | Admitting: Internal Medicine

## 2020-12-08 DIAGNOSIS — F411 Generalized anxiety disorder: Secondary | ICD-10-CM

## 2020-12-08 NOTE — Telephone Encounter (Signed)
Requested medication (s) are due for refill today: yes  Requested medication (s) are on the active medication list: yes  Last refill:  06/17/20 #270 0 refills   Future visit scheduled: yes in 1 month  Notes to clinic:  not delegated per protocol      Requested Prescriptions  Pending Prescriptions Disp Refills   phenytoin (DILANTIN) 100 MG ER capsule [Pharmacy Med Name: PHENYTOIN SODIUM EXTENDED 100 MG Capsule] 270 capsule 0    Sig: TAKE 3 [place]     Not Delegated - Neurology:  Anticonvulsants - phenytoin Failed - 12/08/2020  6:40 PM      Failed - This refill cannot be delegated      Failed - Phenytoin (serum) in normal range and within 360 days    Phenytoin Lvl  Date Value Ref Range Status  09/06/2008 5.3 (L) 10.0 - 20.0 mcg/mL Final          Passed - ALT in normal range and within 360 days    ALT  Date Value Ref Range Status  02/05/2020 23 0 - 53 U/L Final          Passed - AST in normal range and within 360 days    AST  Date Value Ref Range Status  02/05/2020 21 0 - 37 U/L Final          Passed - HGB in normal range and within 360 days    Hemoglobin  Date Value Ref Range Status  02/05/2020 13.9 13.0 - 17.0 g/dL Final          Passed - HCT in normal range and within 360 days    HCT  Date Value Ref Range Status  02/05/2020 42.0 39.0 - 52.0 % Final          Passed - PLT in normal range and within 360 days    Platelets  Date Value Ref Range Status  02/05/2020 222.0 150.0 - 400.0 K/uL Final          Passed - WBC in normal range and within 360 days    WBC  Date Value Ref Range Status  02/05/2020 9.7 4.0 - 10.5 K/uL Final          Passed - Valid encounter within last 12 months    Recent Outpatient Visits           4 months ago Anxiety state   [facility] [place], Coralie Keens, NP       Future Appointments             In 1 month [place], Coralie Keens, NP [facility], [facility]            Signed Prescriptions  Disp Refills   citalopram (CELEXA) 40 MG tablet 90 tablet 0    Sig: TAKE 1 TABLET EVERY DAY     Psychiatry:  Antidepressants - SSRI Passed - 12/08/2020  6:40 PM      Passed - Valid encounter within last 6 months    Recent Outpatient Visits           4 months ago Anxiety state   [facility], Coralie Keens, NP       Future Appointments             In 1 month Baity, Coralie Keens, NP [facility], [facility]             atorvastatin (LIPITOR) 20 MG tablet 90 tablet  0    Sig: TAKE 1 TABLET EVERY DAY     Cardiovascular:  Antilipid - Statins Passed - 12/08/2020  6:40 PM      Passed - Total Cholesterol in normal range and within 360 days    Cholesterol  Date Value Ref Range Status  02/05/2020 182 0 - 200 mg/dL Final    Comment:    ATP III Classification       Desirable:  < 200 mg/dL               Borderline High:  200 - 239 mg/dL          High:  > = 240 mg/dL          Passed - LDL in normal range and within 360 days    LDL Cholesterol (Calc)  Date Value Ref Range Status  11/17/2018 127 (H) mg/dL (calc) Final    Comment:    Reference range: <100 . Desirable range <100 mg/dL for primary prevention;   <70 mg/dL for patients with CHD or diabetic patients  with > or = 2 CHD risk factors. Marland Kitchen LDL-C is now calculated using the Martin-Hopkins  calculation, which is a validated novel method providing  better accuracy than the Friedewald equation in the  estimation of LDL-C.  Cresenciano Genre et al. Annamaria Helling. 7510;258(52): 2061-2068  (http://education.[employer].com/faq/FAQ164)    LDL Cholesterol  Date Value Ref Range Status  02/05/2020 94 0 - 99 mg/dL Final          Passed - HDL in normal range and within 360 days    HDL  Date Value Ref Range Status  02/05/2020 62.20 >39.00 mg/dL Final          Passed - Triglycerides in normal range and within 360 days    Triglycerides  Date Value Ref Range Status  02/05/2020 130.0 0.0 - 149.0 mg/dL Final     Comment:    Normal:  <150 mg/dLBorderline High:  150 - 199 mg/dL          Passed - Patient is not pregnant      Passed - Valid encounter within last 12 months    Recent Outpatient Visits           4 months ago Anxiety state   [facility], Coralie Keens, NP       Future Appointments             In 1 month [place], Coralie Keens, NP [facility], [facility]

## 2020-12-08 NOTE — Telephone Encounter (Signed)
Requested Prescriptions  Pending Prescriptions Disp Refills  . citalopram (CELEXA) 40 MG tablet [Pharmacy Med Name: CITALOPRAM HYDROBROMIDE 40 MG Tablet] 90 tablet 0    Sig: TAKE 1 TABLET EVERY DAY     Psychiatry:  Antidepressants - SSRI Passed - 12/08/2020  6:40 PM      Passed - Valid encounter within last 6 months    Recent Outpatient Visits          4 months ago [facility], Coralie Keens, NP      Future Appointments            In 1 month [place], Coralie Keens, NP [facility], [place]           . phenytoin (DILANTIN) 100 MG ER capsule [Pharmacy Med Name: PHENYTOIN SODIUM EXTENDED 100 MG Capsule] 270 capsule 0    Sig: TAKE 3 CAPSULES EVERY DAY     Not Delegated - Neurology:  Anticonvulsants - phenytoin Failed - 12/08/2020  6:40 PM      Failed - This refill cannot be delegated      Failed - Phenytoin (serum) in normal range and within 360 days    Phenytoin Lvl  Date Value Ref Range Status  09/06/2008 5.3 (L) 10.0 - 20.0 mcg/mL Final         Passed - ALT in normal range and within 360 days    ALT  Date Value Ref Range Status  02/05/2020 23 0 - 53 U/L Final         Passed - AST in normal range and within 360 days    AST  Date Value Ref Range Status  02/05/2020 21 0 - 37 U/L Final         Passed - HGB in normal range and within 360 days    Hemoglobin  Date Value Ref Range Status  02/05/2020 13.9 13.0 - 17.0 g/dL Final         Passed - HCT in normal range and within 360 days    HCT  Date Value Ref Range Status  02/05/2020 42.0 39.0 - 52.0 % Final         Passed - PLT in normal range and within 360 days    Platelets  Date Value Ref Range Status  02/05/2020 222.0 150.0 - 400.0 K/uL Final         Passed - WBC in normal range and within 360 days    WBC  Date Value Ref Range Status  02/05/2020 9.7 4.0 - 10.5 K/uL Final         Passed - Valid encounter within last 12 months    Recent Outpatient Visits          4  months ago Anxiety state   [place], Coralie Keens, NP      Future Appointments            In 1 month [place], Coralie Keens, NP [facility], [place]           . atorvastatin (LIPITOR) 20 MG tablet [Pharmacy Med Name: ATORVASTATIN CALCIUM 20 MG Tablet] 90 tablet 0    Sig: TAKE 1 TABLET EVERY DAY     Cardiovascular:  Antilipid - Statins Passed - 12/08/2020  6:40 PM      Passed - Total Cholesterol in normal range and within 360 days    Cholesterol  Date Value Ref Range Status  02/05/2020 182 0 -  200 mg/dL Final    Comment:    ATP III Classification       Desirable:  < 200 mg/dL               Borderline High:  200 - 239 mg/dL          High:  > = 240 mg/dL         Passed - LDL in normal range and within 360 days    LDL Cholesterol (Calc)  Date Value Ref Range Status  11/17/2018 127 (H) mg/dL (calc) Final    Comment:    Reference range: <100 . Desirable range <100 mg/dL for primary prevention;   <70 mg/dL for patients with CHD or diabetic patients  with > or = 2 CHD risk factors. Marland Kitchen LDL-C is now calculated using the Martin-Hopkins  calculation, which is a validated novel method providing  better accuracy than the Friedewald equation in the  estimation of LDL-C.  Cresenciano Genre et al. Annamaria Helling. 0017;494(49): 2061-2068  (http://education.[facility].com/faq/FAQ164)    LDL Cholesterol  Date Value Ref Range Status  02/05/2020 94 0 - 99 mg/dL Final         Passed - HDL in normal range and within 360 days    HDL  Date Value Ref Range Status  02/05/2020 62.20 >39.00 mg/dL Final         Passed - Triglycerides in normal range and within 360 days    Triglycerides  Date Value Ref Range Status  02/05/2020 130.0 0.0 - 149.0 mg/dL Final    Comment:    Normal:  <150 mg/dLBorderline High:  150 - 199 mg/dL         Passed - Patient is not pregnant      Passed - Valid encounter within last 12 months    Recent Outpatient Visits          4 months ago  Anxiety state   [facility] [place], Coralie Keens, NP      Future Appointments            In 1 month [place], Coralie Keens, NP [facility], [facility]

## 2020-12-16 ENCOUNTER — Other Ambulatory Visit: Payer: Self-pay | Admitting: Internal Medicine

## 2020-12-16 NOTE — Telephone Encounter (Signed)
Requested medication (s) are due for refill today:yes  Requested medication (s) are on the active medication list: yes  Last refill: 11/14/20  #90  0 refills  Future visit scheduled yes 01/27/21  Notes to clinic: not delegated  Requested Prescriptions  Pending Prescriptions Disp Refills   ALPRAZolam (XANAX) 0.5 MG tablet [Pharmacy Med Name: ALPRAZOLAM 0.5MG  TABLETS] 90 tablet     Sig: TAKE 1 TABLET BY MOUTH THREE TIMES DAILY     Not Delegated - Psychiatry:  Anxiolytics/Hypnotics Failed - 12/16/2020  8:58 AM      Failed - This refill cannot be delegated      Failed - Urine Drug Screen completed in last 360 days      Passed - Valid encounter within last 6 months    Recent Outpatient Visits           4 months ago [facility], Coralie Keens, NP       Future Appointments             In 1 month [place], NP [facility], [facility]

## 2020-12-22 ENCOUNTER — Telehealth: Payer: Self-pay | Admitting: Internal Medicine

## 2020-12-22 DIAGNOSIS — I10 Essential (primary) hypertension: Secondary | ICD-10-CM

## 2020-12-22 NOTE — Telephone Encounter (Signed)
Medication: losartan (COZAAR) 50 MG tablet [[id number]]   Has the patient contacted their pharmacy? YES [employer] did not send the medication. Requesting it to be sent to the local pharmacy (Agent: If no, request that the patient contact the pharmacy for the refill. If patient does not wish to contact the pharmacy document the reason why and proceed with request.) (Agent: If yes, when and what did the pharmacy advise?)  Preferred Pharmacy (with phone number or street name): [facility] [place], [address] [place] [address] Phone: [phone] Fax: [phone] Hours: Not open 24 hours   Has the patient been seen for an appointment in the last year OR does the patient have an upcoming appointment? YES 07/21/20 Agent: Please be advised that RX refills may take up to 3 business days. We ask that you follow-up with your pharmacy.

## 2020-12-23 ENCOUNTER — Other Ambulatory Visit: Payer: Self-pay | Admitting: Internal Medicine

## 2020-12-23 DIAGNOSIS — I10 Essential (primary) hypertension: Secondary | ICD-10-CM

## 2020-12-23 MED ORDER — LOSARTAN POTASSIUM 50 MG PO TABS: 50.0000 mg | ORAL_TABLET | Freq: Every day | ORAL | 0 refills | Status: DC

## 2020-12-23 NOTE — Telephone Encounter (Signed)
Medication Refill - Medication:  losartan (COZAAR) 50 MG tablet   Has the patient contacted their pharmacy? Yes.   Contact PCP, everything else was sent expect this medication  Preferred Pharmacy (with phone number or street name):  [facility]  [place], [place] [address]  Phone:  [phone]  Fax:  [phone]   Has the patient been seen for an appointment in the last year OR does the patient have an upcoming appointment? Yes.    Agent: Please be advised that RX refills may take up to 3 business days. We ask that you follow-up with your pharmacy.

## 2020-12-23 NOTE — Telephone Encounter (Signed)
Requested Prescriptions  Pending Prescriptions Disp Refills  . losartan (COZAAR) 50 MG tablet 90 tablet 0    Sig: Take 1 tablet (50 mg total) by mouth daily.     Cardiovascular:  Angiotensin Receptor Blockers Failed - 12/23/2020 10:57 AM      Failed - Cr in normal range and within 180 days    Creat  Date Value Ref Range Status  11/17/2018 0.82 0.70 - 1.25 mg/dL Final    Comment:    For patients >68 years of age, the reference limit for Creatinine is approximately 13% higher for people identified as African-American. .    Creatinine, Ser  Date Value Ref Range Status  02/05/2020 0.80 0.40 - 1.50 mg/dL Final   Creatinine,U  Date Value Ref Range Status  11/03/2012 46.42 mg/dL Final    Comment:       Cutoff Values for Urine Drug Screen, Pain Mgmt          Drug Class           Cutoff (ng/mL)          Amphetamines             500          Barbiturates             200          Cocaine Metabolites      150          Benzodiazepines          200          Methadone                300          Opiates                  300          Phencyclidine             25          Propoxyphene             300          Marijuana Metabolites     50    For medical purposes only.         Failed - K in normal range and within 180 days    Potassium  Date Value Ref Range Status  02/05/2020 4.4 3.5 - 5.1 mEq/L Final         Passed - Patient is not pregnant      Passed - Last BP in normal range    BP Readings from Last 1 Encounters:  09/03/20 110/60         Passed - Valid encounter within last 6 months    Recent Outpatient Visits          5 months ago [facility] [place], Coralie Keens, NP      Future Appointments            In 1 month [place], Coralie Keens, NP [facility], [facility]

## 2020-12-28 DIAGNOSIS — J449 Chronic obstructive pulmonary disease, unspecified: Secondary | ICD-10-CM | POA: Diagnosis not present

## 2021-01-15 ENCOUNTER — Encounter: Payer: Self-pay | Admitting: Pulmonary Disease

## 2021-01-15 ENCOUNTER — Other Ambulatory Visit: Payer: Self-pay

## 2021-01-15 ENCOUNTER — Other Ambulatory Visit: Payer: Self-pay | Admitting: Internal Medicine

## 2021-01-15 ENCOUNTER — Ambulatory Visit: Payer: Medicare HMO | Admitting: Pulmonary Disease

## 2021-01-15 VITALS — BP 110/64 | HR 82 | Temp 97.3°F | Ht 68.0 in | Wt 133.2 lb

## 2021-01-15 DIAGNOSIS — J449 Chronic obstructive pulmonary disease, unspecified: Secondary | ICD-10-CM

## 2021-01-15 DIAGNOSIS — R0602 Shortness of breath: Secondary | ICD-10-CM

## 2021-01-15 DIAGNOSIS — J9801 Acute bronchospasm: Secondary | ICD-10-CM | POA: Diagnosis not present

## 2021-01-15 DIAGNOSIS — F17210 Nicotine dependence, cigarettes, uncomplicated: Secondary | ICD-10-CM | POA: Diagnosis not present

## 2021-01-15 MED ORDER — PREDNISONE 10 MG (21) PO TBPK: ORAL_TABLET | ORAL | 0 refills | Status: DC

## 2021-01-15 NOTE — Progress Notes (Signed)
Subjective:    Patient ID: Elijah Mcfarland, male    DOB: 1950-03-22, 70 y.o.   MRN: [id number] Chief Complaint  Patient presents with   Follow-up    COPD-    HPI Elijah Mcfarland is a 70 year old current smoker (1 PPD) who presents for follow-up on COPD GOLD class III.  This is a scheduled visit.  He was last seen by me on 03 September 2020 and at that time we rechallenged him with [facility].  He has chronic dyspnea.  He unfortunately continues to smoke 1 pack of cigarettes per day. The patient has been on Breztri since his last visit and notices that this helps him, his dyspnea has been better controlled.  Cough is at baseline mostly in the mornings productive of white sputum but overall no cough during the day.  He does have significant rhinorrhea during the day particularly after meals.  This is not new.  No fevers, chills or sweats.  No chest pain.  He was due to have lung cancer screening CT in August however, peers that he has lapsed in the program.  We will have to reinstate him.   Review of Systems A 10 point review of systems was performed and it is as noted above otherwise negative.  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   COPD (chronic obstructive pulmonary disease) ([place]) 04/25/2006   Arthritis 04/25/2006   Social History   Tobacco Use   Smoking status: Every Day    Packs/day: 3.00    Years: 51.00    Pack years: 153.00    Types: Cigarettes   Smokeless tobacco: Never   Tobacco comments:    1 ppd/ 01/15/21  Substance Use Topics   Alcohol use: No    Alcohol/week: 0.0 standard drinks   Allergies  Allergen Reactions   Nsaids Other (See Comments)    GI bleed and  vomiting   Current Meds  Medication Sig   acetaminophen (TYLENOL) 325 MG tablet Take 2 tablets (650 mg total) by mouth every 6 (six) hours as needed for mild pain or headache (or Fever >/= 101).   albuterol (VENTOLIN HFA) 108 (90 Base) MCG/ACT inhaler Inhale 2 puffs into the lungs every 6 (six) hours as needed for wheezing or shortness of breath.   atorvastatin (LIPITOR) 20 MG tablet TAKE 1 TABLET EVERY DAY   Budeson-Glycopyrrol-Formoterol (BREZTRI AEROSPHERE) 160-9-4.8 MCG/ACT AERO Inhale 2 puffs into the lungs in the morning and at bedtime.   citalopram (CELEXA) 40 MG tablet TAKE 1 TABLET EVERY DAY   loratadine (CLARITIN) 10 MG tablet Take 10 mg by mouth daily.   losartan (COZAAR) 50 MG tablet Take 1 tablet (50 mg total) by mouth daily.   mirtazapine (REMERON) 7.5 MG tablet TAKE 1 TABLET(7.5 MG) BY MOUTH AT BEDTIME   Nutritional Supplements (NUTRITIONAL SUPPLEMENT PO) Take by mouth. Super Beta Prostate Advanced   OXYGEN Inhale into the lungs at bedtime.   phenytoin (DILANTIN) 100 MG ER capsule TAKE 3 CAPSULES EVERY DAY   predniSONE (STERAPRED UNI-PAK 21 TAB) 10 MG (21) TBPK tablet Take as directed in the package.  This is a taper pack.   [DISCONTINUED] ALPRAZolam (XANAX) 0.5 MG tablet TAKE 1 TABLET BY MOUTH THREE TIMES  DAILY   [DISCONTINUED] predniSONE (STERAPRED UNI-PAK 21 TAB) 10 MG (21) TBPK tablet Take as directed in the package, this is a taper pack.   Immunization History  Administered Date(s) Administered   Fluad Quad(high Dose 65+) 11/17/2018, 02/05/2020   H1N1 01/05/2008   Influenza Whole 11/07/2007   Influenza, High Dose Seasonal PF 03/03/2018   Influenza, Seasonal, Injecte, Preservative Fre 04/13/2013   Influenza,inj,Quad PF,6+ Mos 11/14/2014, 02/19/2016, 02/18/2017   Moderna SARS-COV2 Booster Vaccination 12/26/2019   Moderna Sars-Covid-2 Vaccination 04/22/2019, 05/20/2019   Pneumococcal Conjugate-13 11/14/2014, 02/05/2020   Pneumococcal Polysaccharide-23 11/07/2007,  02/19/2016, 03/03/2018   Td 09/06/2008       Objective:   Physical Exam BP 110/64 (BP Location: Left Arm, Patient Position: Sitting, Cuff Size: Normal)   Pulse 82   Temp (!) 97.3 F (36.3 C) (Oral)   Ht 5\' 8"  (1.727 m)   Wt 133 lb 3.2 oz (60.4 kg)   SpO2 98%   BMI 20.25 kg/m  GENERAL: Thin, well-developed male, no overt respiratory distress, fully ambulatory. HEAD: Normocephalic, atraumatic. EYES: Pupils equal, round, reactive to light.  No scleral icterus. MOUTH: Nose/mouth/throat not examined due to masking requirements for COVID 19. NECK: Supple. No thyromegaly. No nodules. No JVD.  Trachea midline PULMONARY: Symmetrical air entry, no accessory muscle use noted today, faint wheezes noted, no other adventitious sounds, breath sounds are coarse. CARDIOVASCULAR: S1 and S2. Regular rate and rhythm.  No overt murmurs, gallops or rubs noted. GASTROINTESTINAL: No distention.  Benign. MUSCULOSKELETAL: No joint deformity, no clubbing, no edema. NEUROLOGIC: No focal deficits, fluent speech, awake, alert. SKIN: Intact,warm,dry.  No rashes noted on limited exam. PSYCH: Normal mood and behavior      Assessment & Plan:     ICD-10-CM   1. Stage 3 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 ([place])  J44.9    Continue Breztri 2 puffs twice a day Mild bronchospasm noted today Prednisone taper    2. Shortness of breath  R06.02    Improved on Breztri    3. Bronchospasm  J98.01    Prednisone taper     4. Tobacco dependence due to cigarettes  F17.210    Patient counseled with regards to discontinuation of smoking Reinstitute lung cancer screening     Meds ordered this encounter  Medications   predniSONE (STERAPRED UNI-PAK 21 TAB) 10 MG (21) TBPK tablet    Sig: Take as directed in the package.  This is a taper pack.    Dispense:  21 tablet    Refill:  0   Patient was noted to have some bronchospasm today.  Given his recent issues with rhinorrhea it appears may be related to allergic  symptoms.  We will treat him with a short course of prednisone taper.  We have reinstituted lung cancer screening.  We will see him in follow-up in 4 months time he is to call sooner should any new problems arise.  As ever, he was counseled extensively with regards to discontinuation of smoking.  Renold Don, MD Advanced Bronchoscopy PCCM [facility]  *This note was dictated using voice recognition software/Dragon.  Despite best efforts to proofread, errors can occur which can change the meaning.  Any change was purely unintentional.

## 2021-01-15 NOTE — Patient Instructions (Signed)
We have sent a prescription of prednisone to your pharmacy.  You should be getting a call from the lung cancer screening program to reschedule your CT.  We will see you in follow-up in 4 months time call sooner should any new problems arise.

## 2021-01-16 ENCOUNTER — Encounter: Payer: Self-pay | Admitting: Pulmonary Disease

## 2021-01-16 NOTE — Telephone Encounter (Signed)
Requested medication (s) are due for refill today: Yes Requested medication (s) are on the active medication list: Yes  Last refill:  12/16/20  Future visit scheduled: Yes  Notes to clinic:  See request.    Requested Prescriptions  Pending Prescriptions Disp Refills   ALPRAZolam (XANAX) 0.5 MG tablet [Pharmacy Med Name: ALPRAZOLAM 0.5MG  TABLETS] 90 tablet     Sig: TAKE 1 TABLET BY MOUTH THREE TIMES DAILY     Not Delegated - Psychiatry:  Anxiolytics/Hypnotics Failed - 01/15/2021  9:01 AM      Failed - This refill cannot be delegated      Failed - Urine Drug Screen completed in last 360 days      Passed - Valid encounter within last 6 months    Recent Outpatient Visits           5 months ago Anxiety state   [facility], Coralie Keens, NP       Future Appointments             In 1 week Garnette Gunner, Coralie Keens, NP [facility], [facility]

## 2021-01-19 ENCOUNTER — Ambulatory Visit (INDEPENDENT_AMBULATORY_CARE_PROVIDER_SITE_OTHER): Payer: Medicare HMO | Admitting: Pharmacist

## 2021-01-19 ENCOUNTER — Other Ambulatory Visit: Payer: Self-pay | Admitting: *Deleted

## 2021-01-19 DIAGNOSIS — J449 Chronic obstructive pulmonary disease, unspecified: Secondary | ICD-10-CM

## 2021-01-19 DIAGNOSIS — Z87891 Personal history of nicotine dependence: Secondary | ICD-10-CM

## 2021-01-19 DIAGNOSIS — F17210 Nicotine dependence, cigarettes, uncomplicated: Secondary | ICD-10-CM

## 2021-01-19 NOTE — Patient Instructions (Signed)
Visit Information  Thank you for taking time to visit with me today. Please don't hesitate to contact me if I can be of assistance to you before our next scheduled telephone appointment.  Following are the goals we discussed today:  Please use your Breztri 2 puffs twice a day.  Make sure you rinse your mouth well after you use it.  Feel free to call me with any questions or concerns. I look forward to our next call!   Wallace Cullens, PharmD, Para March, CPP Clinical Pharmacist [facility] [phone]  Our next appointment is by telephone on 03/04/2021 at 1:00 PM  Please call the care guide team at [phone] if you need to cancel or reschedule your appointment.    The patient verbalized understanding of instructions, educational materials, and care plan provided today and declined offer to receive copy of patient instructions, educational materials, and care plan.

## 2021-01-19 NOTE — Chronic Care Management (AMB) (Signed)
Chronic Care Management CCM Pharmacy Note  01/19/2021 Name:  Elijah Mcfarland MRN:  [id number] DOB:  [birth year]  Subjective: Elijah Mcfarland is an 70 y.o. year old male who is a primary patient of [name], NP.  The CCM team was consulted for assistance with disease management and care coordination needs.    Engaged with patient by telephone for follow up visit for pharmacy case management and/or care coordination services.   Objective:  Medications Reviewed Today     Reviewed by Tyler Pita, MD (Physician) on 01/16/21 at [place] List Status: <None>   Medication Order Taking? Sig Documenting Provider Last Dose Status Informant  acetaminophen (TYLENOL) 325 MG tablet [id number] Yes Take 2 tablets (650 mg total) by mouth every 6 (six) hours as needed for mild pain or headache (or Fever >/= 101). Roxan Hockey, MD Taking Active Self  albuterol (VENTOLIN HFA) 108 (90 Base) MCG/ACT inhaler [id number] Yes Inhale 2 puffs into the lungs every 6 (six) hours as needed for wheezing or shortness of breath. Tyler Pita, MD Taking Active   ALPRAZolam [name]) 0.5 MG tablet [id number]  TAKE 1 TABLET BY MOUTH THREE TIMES DAILY [name], NP  Active   atorvastatin (LIPITOR) 20 MG tablet [id number] Yes TAKE 1 TABLET EVERY DAY Baity, Coralie Keens, NP Taking Active   Budeson-Glycopyrrol-Formoterol (BREZTRI AEROSPHERE) 160-9-4.8 MCG/ACT AERO [id number] Yes Inhale 2 puffs into the lungs in the morning and at bedtime. Tyler Pita, MD Taking Active   citalopram [facility]) 40 MG tablet [id number] Yes TAKE 1 TABLET EVERY DAY Baity, Coralie Keens, NP Taking Active   loratadine (CLARITIN) 10 MG tablet [id number] Yes Take 10 mg by mouth daily. [provider] Taking Active   losartan (COZAAR) 50 MG tablet [id number] Yes Take 1 tablet (50 mg total) by mouth daily. [name], NP Taking Active   mirtazapine (REMERON) 7.5 MG tablet [id number] Yes TAKE 1 TABLET(7.5 MG) BY MOUTH AT BEDTIME  [name], NP Taking Active   Nutritional Supplements (NUTRITIONAL SUPPLEMENT PO) [id number] Yes Take by mouth. Super Beta Prostate Advanced [provider] Taking Active   OXYGEN [id number] Yes Inhale into the lungs at bedtime. [provider] Taking Active Self  phenytoin (DILANTIN) 100 MG ER capsule [id number] Yes TAKE 3 CAPSULES EVERY DAY Baity, Coralie Keens, NP Taking Active   predniSONE (STERAPRED UNI-PAK 21 TAB) 10 MG (21) TBPK tablet [id number] Yes Take as directed in the package.  This is a taper pack. Tyler Pita, MD  Active   Med List Note Lurlean Nanny, [place] 08/07/19 4944): UDS/CSA 07/06/19 repeat 10/06/2019            Pertinent Labs:   Lab Results  Component Value Date   CREATININE 0.80 02/05/2020   BUN 11 02/05/2020   NA 138 02/05/2020   K 4.4 02/05/2020   CL 102 02/05/2020   CO2 31 02/05/2020    SDOH:  (Social Determinants of Health) assessments and interventions performed:    CCM Care Plan  Review of patient past medical history, allergies, medications, health status, including review of consultants reports, laboratory and other test data, was performed as part of comprehensive evaluation and provision of chronic care management services.   Care Plan : PharmD - Medication Assistance  Updates made by Rennis Petty, RPH-CPP since 01/19/2021 12:00 AM     Problem: Disease Progression      Long-Range Goal: Disease Progression Prevented or Minimized   Start Date: 08/08/2020  Expected End Date: 11/06/2020  This Visit's Progress: On track  Recent Progress: On track  Priority: High  Note:   Current Barriers:  Unable to independently afford treatment regimen Reports cost of Trelegy inhaler unaffordable as currently in coverage gap of [employer] Medicare Part D plan Patient APPROVED for [name] patient assistance from [employer] 09/04/20-02/14/21  Patient is legally blind  Pharmacist Clinical Goal(s):  Over the next 90 days, patient  will verbalize ability to afford treatment regimen through collaboration with PharmD and provider.   Interventions: 1:1 collaboration with [name], NP regarding development and update of comprehensive plan of care as evidenced by provider attestation and co-signature Inter-disciplinary care team collaboration (see longitudinal plan of care) Perform chart review. Patient seen for Office Visit with [place] on 12/1. Provider advised patient: Rx for prednisone course sent to pharmacy He should be getting a call from the lung cancer screening program to reschedule your CT Follow up in 4 months  COPD: Patient followed by [facility] Current treatment: Breztri - 2 puffs twice daily Albuterol as needed Confirms using Breztri inhaler twice daily (morning and evening) and rinsing mouth out after use Reports symptoms remain well-controlled with [name] Confirms taking prednisone course as directed by Pulmonologist and doing well  Medication Assistance: Reports received 2 more inhalers via mail from assistance program this past week Patient interested in continuing to receive Breztri from [employer] patient assistance program Per [employer] program, no paperwork from patient needed for re-enrollment for 2023 calendar year Will collaborate with [place] Simcox to request assistance with obtaining/submission of provider portion to assistance program for re-enrollment  Patient Goals/Self-Care Activities Over the next 90 days, patient will:  - collaborate with provider on medication access solutions - attend medical appointment as scheduled  Next appointment with PCP on 12/13  Follow Up Plan: Telephone follow up appointment with care management team member scheduled for: 03/04/2021 at 1:00 PM      Wallace Cullens, PharmD, Para March, [place] [phone]

## 2021-01-20 ENCOUNTER — Telehealth: Payer: Self-pay | Admitting: Pulmonary Disease

## 2021-01-20 ENCOUNTER — Telehealth: Payer: Self-pay | Admitting: Pharmacy Technician

## 2021-01-20 NOTE — Telephone Encounter (Signed)
Breztri Rx received and placed in Dr. Domingo Dimes look at folder for signature.

## 2021-01-20 NOTE — Progress Notes (Signed)
[facility])                                            [place] Team    01/20/2021  Elijah Mcfarland 30-Sep-1950 [id number]  FOR 2023 RE ENROLLMENT                                      Medication Assistance Referral  Referral From: [facility] Embedded RPh Dorthula Perfect   Medication/Company: [name] / [employer] Patient application portion: N/A patient was enrolled in 2022 and has Medicare Part D therefore per program guidelines will automatically be re enrolled for 2909 Provider application portion: Faxed  to Dr. Patsey Berthold Provider address/fax verified via: Office website    [name] P. Bracen Schum, [place]  [phone]

## 2021-01-23 NOTE — Telephone Encounter (Signed)
Rx signed and faxed back to med management.

## 2021-01-26 ENCOUNTER — Other Ambulatory Visit: Payer: Self-pay

## 2021-01-26 ENCOUNTER — Ambulatory Visit
Admission: RE | Admit: 2021-01-26 | Discharge: 2021-01-26 | Disposition: A | Discharge status: Home / Self Care | Payer: Medicare HMO | Source: Ambulatory Visit | Attending: Acute Care | Admitting: Acute Care

## 2021-01-26 DIAGNOSIS — Z87891 Personal history of nicotine dependence: Secondary | ICD-10-CM | POA: Diagnosis not present

## 2021-01-26 DIAGNOSIS — F17210 Nicotine dependence, cigarettes, uncomplicated: Secondary | ICD-10-CM

## 2021-01-27 ENCOUNTER — Encounter: Payer: Medicare HMO | Admitting: Internal Medicine

## 2021-01-27 ENCOUNTER — Telehealth: Payer: Self-pay | Admitting: Pharmacy Technician

## 2021-01-27 DIAGNOSIS — Z596 Low income: Secondary | ICD-10-CM

## 2021-01-27 NOTE — Progress Notes (Signed)
[facility])                                            [place] Team    01/27/2021  Elijah Mcfarland 09-Apr-1950 [id number]  Received provider portion(s) of patient assistance application(s) for [facility]. Faxed completed application and prescription into [employer].  Arel Tippen P. [place], [place]  [phone]

## 2021-02-05 ENCOUNTER — Other Ambulatory Visit: Payer: Self-pay | Admitting: Acute Care

## 2021-02-05 DIAGNOSIS — F17210 Nicotine dependence, cigarettes, uncomplicated: Secondary | ICD-10-CM

## 2021-02-05 DIAGNOSIS — Z87891 Personal history of nicotine dependence: Secondary | ICD-10-CM

## 2021-02-16 ENCOUNTER — Other Ambulatory Visit: Payer: Self-pay | Admitting: Internal Medicine

## 2021-02-17 NOTE — Telephone Encounter (Signed)
Requested medication (s) are due for refill today: yes  Requested medication (s) are on the active medication list: yes  Last refill:  01/16/21 #90  Future visit scheduled: yes  Notes to clinic:  Please review for refill. Refill not delegated per protocol.     Requested Prescriptions  Pending Prescriptions Disp Refills   ALPRAZolam (XANAX) 0.5 MG tablet [Pharmacy Med Name: ALPRAZOLAM 0.5MG  TABLETS] 90 tablet     Sig: TAKE 1 TABLET BY MOUTH THREE TIMES DAILY     Not Delegated - Psychiatry:  Anxiolytics/Hypnotics Failed - 02/17/2021  3:07 PM      Failed - This refill cannot be delegated      Failed - Urine Drug Screen completed in last 360 days      Failed - Valid encounter within last 6 months    Recent Outpatient Visits           7 months ago [facility] [place], Coralie Keens, NP       Future Appointments             In 3 weeks Garnette Gunner, Coralie Keens, NP [facility], [facility]

## 2021-02-17 NOTE — Telephone Encounter (Signed)
Pt called in wanting to know if this Rx will be refilled, please advise.

## 2021-03-04 ENCOUNTER — Ambulatory Visit (INDEPENDENT_AMBULATORY_CARE_PROVIDER_SITE_OTHER): Payer: Medicare HMO | Admitting: Pharmacist

## 2021-03-04 DIAGNOSIS — J449 Chronic obstructive pulmonary disease, unspecified: Secondary | ICD-10-CM

## 2021-03-04 NOTE — Patient Instructions (Signed)
Visit Information  Thank you for taking time to visit with me today. Please don't hesitate to contact me if I can be of assistance to you before our next scheduled telephone appointment.  Following are the goals we discussed today:   Goals Addressed             This Visit's Progress    Pharmacy Goals       Please use your Breztri 2 puffs twice a day.  Make sure you rinse your mouth well after you use it.  Please consider calling the [place] Quitline for their help with quitting smoking.  The [place] Quitline phone number is: [phone]  Feel free to call me with any questions or concerns. I look forward to our next call!   Wallace Cullens, PharmD, Para March, CPP Clinical Pharmacist [facility] [phone]         Our next appointment is by telephone on 06/01/2021 at 1:45 pm  Please call the care guide team at [phone] if you need to cancel or reschedule your appointment.    The patient verbalized understanding of instructions, educational materials, and care plan provided today and declined offer to receive copy of patient instructions, educational materials, and care plan.

## 2021-03-04 NOTE — Chronic Care Management (AMB) (Signed)
Chronic Care Management CCM Pharmacy Note  03/04/2021 Name:  Elijah Mcfarland MRN:  [id number] DOB:  May 27, 1950   Subjective: Elijah Mcfarland is an 71 y.o. year old male who is a primary patient of [name], NP.  The CCM team was consulted for assistance with disease management and care coordination needs.    Engaged with patient by telephone for follow up visit for pharmacy case management and/or care coordination services.   Objective:  Medications Reviewed Today     Reviewed by Rennis Petty, RPH-CPP (Pharmacist) on 03/04/21 at 1330  Med List Status: <None>   Medication Order Taking? Sig Documenting Provider Last Dose Status Informant  acetaminophen (TYLENOL) 325 MG tablet [id number]  Take 2 tablets (650 mg total) by mouth every 6 (six) hours as needed for mild pain or headache (or Fever >/= 101). Roxan Hockey, MD  Active Self  albuterol (VENTOLIN HFA) 108 (90 Base) MCG/ACT inhaler [id number] Yes Inhale 2 puffs into the lungs every 6 (six) hours as needed for wheezing or shortness of breath. Tyler Pita, MD Taking Active   ALPRAZolam [name]) 0.5 MG tablet [id number]  TAKE 1 TABLET BY MOUTH THREE TIMES DAILY [name], NP  Active   atorvastatin (LIPITOR) 20 MG tablet [id number]  TAKE 1 TABLET EVERY DAY Baity, Coralie Keens, NP  Active   Budeson-Glycopyrrol-Formoterol (BREZTRI AEROSPHERE) 160-9-4.8 MCG/ACT AERO [id number] Yes Inhale 2 puffs into the lungs in the morning and at bedtime. Tyler Pita, MD Taking Active   citalopram (CELEXA) 40 MG tablet [id number]  TAKE 1 TABLET EVERY DAY Baity, Coralie Keens, NP  Active   loratadine (CLARITIN) 10 MG tablet [id number]  Take 10 mg by mouth daily. [provider]  Active   losartan (COZAAR) 50 MG tablet [id number]  Take 1 tablet (50 mg total) by mouth daily. [name], NP  Active   mirtazapine (REMERON) 7.5 MG tablet [id number]  TAKE 1 TABLET(7.5 MG) BY MOUTH AT BEDTIME [name], NP  Active   Nutritional  Supplements (NUTRITIONAL SUPPLEMENT PO) [id number]  Take by mouth. Super Beta Prostate Advanced [provider]  Active   OXYGEN [id number]  Inhale into the lungs at bedtime. [provider]  Active Self  phenytoin (DILANTIN) 100 MG ER capsule [id number]  TAKE 3 CAPSULES EVERY DAY [name], NP  Active     Discontinued 03/04/21 1330 (Completed Course)   Med List Note Lurlean Nanny, [place] 08/07/19 2800): UDS/CSA 07/06/19 repeat 10/06/2019            Pertinent Labs:   Lab Results  Component Value Date   CREATININE 0.80 02/05/2020   BUN 11 02/05/2020   NA 138 02/05/2020   K 4.4 02/05/2020   CL 102 02/05/2020   CO2 31 02/05/2020    SDOH:  (Social Determinants of Health) assessments and interventions performed: yes SDOH Interventions    Flowsheet Row Most Recent Value  SDOH Interventions   SDOH Interventions for the Following Domains Tobacco  Tobacco Interventions Other (Comment)  [Asked about interest in quitting smoking and recommended patient consider calling [place]  Review of patient past medical history, allergies, medications, health status, including review of consultants reports, laboratory and other test data, was performed as part of comprehensive evaluation and provision of chronic care management services.   Care Plan : PharmD - Medication Assistance  Updates made by Rennis Petty, RPH-CPP since 03/04/2021 12:00 AM  Problem: Disease Progression      Long-Range Goal: Disease Progression Prevented or Minimized   Start Date: 08/08/2020  Expected End Date: 11/06/2020  This Visit's Progress: On track  Recent Progress: On track  Priority: High  Note:   Current Barriers:  Unable to independently afford treatment regimen Reports cost of Trelegy inhaler unaffordable as currently in coverage gap of [employer] Medicare Part D plan Patient APPROVED for [name] patient assistance from [employer]  09/04/20-02/14/22 Patient is legally blind  Pharmacist Clinical Goal(s):  Over the next 90 days, patient will verbalize ability to afford treatment regimen through collaboration with PharmD and provider.   Interventions: 1:1 collaboration with [name], NP regarding development and update of comprehensive plan of care as evidenced by provider attestation and co-signature Inter-disciplinary care team collaboration (see longitudinal plan of care)  Medication Assistance: Collaborated with [place] Simcox for assistance with obtaining/submission of provider portion to assistance program for re-enrollment for 2023 calendar year From review of chart, note [facility] CPhT faxed provider portion of application to [employer] on 12/13 Today patient confirms received 3 Breztri inhalers from [employer] assistance program this week  COPD: Patient followed by [facility] Current treatment: Breztri - 2 puffs twice daily Albuterol as needed Confirms using Breztri inhaler twice daily (morning and evening) and rinsing mouth out after use Reports symptoms remain well-controlled with Breztri  Tobacco Use: Reports currently smokes ~ 1 pack of cigarettes/day States he would like to quit, but not ready to do so at this time Patient agreeable to discuss with CCM Pharmacist again during next telephone appointment Ask patient to contact CCM Pharmacist if interested in further information on smoking cessation sooner or consider contacting [place] Quitline for support   Patient Goals/Self-Care Activities Over the next 90 days, patient will:  - collaborate with provider on medication access solutions - attend medical appointment as scheduled  Follow Up Plan: Telephone follow up appointment with care management team member scheduled for: 06/01/2021 at 1:45 pm      Wallace Cullens, PharmD, BCACP, [place] [phone]

## 2021-03-05 ENCOUNTER — Telehealth: Payer: Self-pay | Admitting: Pharmacy Technician

## 2021-03-05 DIAGNOSIS — Z596 Low income: Secondary | ICD-10-CM

## 2021-03-05 NOTE — Progress Notes (Signed)
[place] [facility] [place])                                            [place] Team    03/05/2021  Elijah Mcfarland 1950-08-05 [id number]  Care coordination call placed to [employer] in regard to [facility] application.  Spoke to [place] who informs patient is APPROVED 02/15/21-02/14/22. Medication will auto fill and ship based on last fill date in 2022 and going forward with delivery to the patient's home.  Rahsaan Weakland P. Donoven Pett, [place]  [phone]

## 2021-03-10 ENCOUNTER — Ambulatory Visit (INDEPENDENT_AMBULATORY_CARE_PROVIDER_SITE_OTHER): Payer: Medicare HMO | Admitting: Internal Medicine

## 2021-03-10 ENCOUNTER — Other Ambulatory Visit: Payer: Self-pay

## 2021-03-10 ENCOUNTER — Encounter: Payer: Self-pay | Admitting: Internal Medicine

## 2021-03-10 VITALS — BP 122/70 | HR 71 | Temp 97.7°F | Ht 68.0 in | Wt 130.2 lb

## 2021-03-10 DIAGNOSIS — Z23 Encounter for immunization: Secondary | ICD-10-CM

## 2021-03-10 DIAGNOSIS — I700 Atherosclerosis of aorta: Secondary | ICD-10-CM | POA: Diagnosis not present

## 2021-03-10 DIAGNOSIS — R7309 Other abnormal glucose: Secondary | ICD-10-CM | POA: Diagnosis not present

## 2021-03-10 DIAGNOSIS — Z5181 Encounter for therapeutic drug level monitoring: Secondary | ICD-10-CM | POA: Diagnosis not present

## 2021-03-10 DIAGNOSIS — Z0001 Encounter for general adult medical examination with abnormal findings: Secondary | ICD-10-CM | POA: Diagnosis not present

## 2021-03-10 DIAGNOSIS — E440 Moderate protein-calorie malnutrition: Secondary | ICD-10-CM | POA: Diagnosis not present

## 2021-03-10 DIAGNOSIS — E785 Hyperlipidemia, unspecified: Secondary | ICD-10-CM | POA: Diagnosis not present

## 2021-03-10 DIAGNOSIS — M6259 Muscle wasting and atrophy, not elsewhere classified, multiple sites: Secondary | ICD-10-CM

## 2021-03-10 DIAGNOSIS — F5101 Primary insomnia: Secondary | ICD-10-CM | POA: Diagnosis not present

## 2021-03-10 DIAGNOSIS — R739 Hyperglycemia, unspecified: Secondary | ICD-10-CM

## 2021-03-10 DIAGNOSIS — Z125 Encounter for screening for malignant neoplasm of prostate: Secondary | ICD-10-CM | POA: Diagnosis not present

## 2021-03-10 MED ORDER — ESZOPICLONE 1 MG PO TABS: 1.0000 mg | ORAL_TABLET | Freq: Every evening | ORAL | 0 refills | Status: DC | PRN

## 2021-03-10 NOTE — Assessment & Plan Note (Signed)
He stopped Mirtazapine secondary to nightmares Encouraged high-protein diet, supplement with protein drinks as needed

## 2021-03-10 NOTE — Patient Instructions (Signed)
Health Maintenance After Age 69 After age 69, you are at a higher risk for certain long-term diseases and infections as well as injuries from falls. Falls are a major cause of broken bones and head injuries in people who are older than age 69. Getting regular preventive care can help to keep you healthy and well. Preventive care includes getting regular testing and making lifestyle changes as recommended by your health care provider. Talk with your health care provider about: Which screenings and tests you should have. A screening is a test that checks for a disease when you have no symptoms. A diet and exercise plan that is right for you. What should I know about screenings and tests to prevent falls? Screening and testing are the best ways to find a health problem early. Early diagnosis and treatment give you the best chance of managing medical conditions that are common after age 69. Certain conditions and lifestyle choices may make you more likely to have a fall. Your health care provider may recommend: Regular vision checks. Poor vision and conditions such as cataracts can make you more likely to have a fall. If you wear glasses, make sure to get your prescription updated if your vision changes. Medicine review. Work with your health care provider to regularly review all of the medicines you are taking, including over-the-counter medicines. Ask your health care provider about any side effects that may make you more likely to have a fall. Tell your health care provider if any medicines that you take make you feel dizzy or sleepy. Strength and balance checks. Your health care provider may recommend certain tests to check your strength and balance while standing, walking, or changing positions. Foot health exam. Foot pain and numbness, as well as not wearing proper footwear, can make you more likely to have a fall. Screenings, including: Osteoporosis screening. Osteoporosis is a condition that causes  the bones to get weaker and break more easily. Blood pressure screening. Blood pressure changes and medicines to control blood pressure can make you feel dizzy. Depression screening. You may be more likely to have a fall if you have a fear of falling, feel depressed, or feel unable to do activities that you used to do. Alcohol use screening. Using too much alcohol can affect your balance and may make you more likely to have a fall. Follow these instructions at home: Lifestyle Do not drink alcohol if: Your health care provider tells you not to drink. If you drink alcohol: Limit how much you have to: 0-1 drink a day for women. 0-2 drinks a day for men. Know how much alcohol is in your drink. In the [place] one drink equals one 12 oz bottle of beer (355 mL), one 5 oz glass of wine (148 mL), or one 1 oz glass of hard liquor (44 mL). Do not use any products that contain nicotine or tobacco. These products include cigarettes, chewing tobacco, and vaping devices, such as e-cigarettes. If you need help quitting, ask your health care provider. Activity  Follow a regular exercise program to stay fit. This will help you maintain your balance. Ask your health care provider what types of exercise are appropriate for you. If you need a cane or walker, use it as recommended by your health care provider. Wear supportive shoes that have nonskid soles. Safety  Remove any tripping hazards, such as rugs, cords, and clutter. Install safety equipment such as grab bars in bathrooms and safety rails on stairs. Keep rooms and walkways   well-lit. General instructions Talk with your health care provider about your risks for falling. Tell your health care provider if: You fall. Be sure to tell your health care provider about all falls, even ones that seem minor. You feel dizzy, tiredness (fatigue), or off-balance. Take over-the-counter and prescription medicines only as told by your health care provider. These include  supplements. Eat a healthy diet and maintain a healthy weight. A healthy diet includes low-fat dairy products, low-fat (lean) meats, and fiber from whole grains, beans, and lots of fruits and vegetables. Stay current with your vaccines. Schedule regular health, dental, and eye exams. Summary Having a healthy lifestyle and getting preventive care can help to protect your health and wellness after age 69. Screening and testing are the best way to find a health problem early and help you avoid having a fall. Early diagnosis and treatment give you the best chance for managing medical conditions that are more common for people who are older than age 69. Falls are a major cause of broken bones and head injuries in people who are older than age 69. Take precautions to prevent a fall at home. Work with your health care provider to learn what changes you can make to improve your health and wellness and to prevent falls. This information is not intended to replace advice given to you by your health care provider. Make sure you discuss any questions you have with your health care provider. Document Revised: 06/23/2020 Document Reviewed: 06/23/2020 Elsevier Patient Education  2022 Elsevier Inc.  

## 2021-03-10 NOTE — Progress Notes (Signed)
Subjective:    Patient ID: Elijah Mcfarland, male    DOB: May 06, 1950, 71 y.o.   MRN: [id number]  HPI  Patient presents the clinic today for his annual exam.  He stopped Mirtazapine because he reports it caused sleepwalking and nightmares.  He does not sleep well and is wondering if there is another alternative to help him sleep.  Flu: 01/2020 Tetanus: 08/2008 COVID: Moderna x3 Pneumovax: 02/2018 Prevnar: 01/2020 Shingrix: Never PSA screening: 01/2020 Colon screening: 02/2020 Vision screening: as needed Dentist: as needed, dentures  Diet: He does eat meat. He consumes fruits and veggies. He tries to avoid fried foods. He drinks mostly coffee. Exercise: Walking   Review of Systems     Past Medical History:  Diagnosis Date   Chronic pain syndrome    Left knee   COPD (chronic obstructive pulmonary disease) (HCC)    DDD (degenerative disc disease), lumbar 11/2008   L5-S1 on plain film   Emphysema lung (HCC)    GAD (generalized anxiety disorder)    GERD (gastroesophageal reflux disease)    History of multiple pulmonary nodules 07/2010   Follow up CT scans showed resolution by 03/2011--NO MALIGNANCY.   History of pneumonia 06/2010   HTN (hypertension) 01/07/2014   Osteoarthritis of left knee    + past injury of this knee--tibia fracture?  +left leg shorter than right   Seizure (HCC)    Seizure disorder (HCC)    3 total seizures (first was 1995); neuro eval done and recommended lifetime phenytoin (he had a seizure after coming off the med in the late 90s   Tobacco dependence     Current Outpatient Medications  Medication Sig Dispense Refill   acetaminophen (TYLENOL) 325 MG tablet Take 2 tablets (650 mg total) by mouth every 6 (six) hours as needed for mild pain or headache (or Fever >/= 101). 30 tablet 2   albuterol (VENTOLIN HFA) 108 (90 Base) MCG/ACT inhaler Inhale 2 puffs into the lungs every 6 (six) hours as needed for wheezing or shortness of breath. 1 each 2   ALPRAZolam  (XANAX) 0.5 MG tablet TAKE 1 TABLET BY MOUTH THREE TIMES DAILY 90 tablet 0   atorvastatin (LIPITOR) 20 MG tablet TAKE 1 TABLET EVERY DAY 90 tablet 0   Budeson-Glycopyrrol-Formoterol (BREZTRI AEROSPHERE) 160-9-4.8 MCG/ACT AERO Inhale 2 puffs into the lungs in the morning and at bedtime. 32.1 g 3   citalopram (CELEXA) 40 MG tablet TAKE 1 TABLET EVERY DAY 90 tablet 0   loratadine (CLARITIN) 10 MG tablet Take 10 mg by mouth daily.     losartan (COZAAR) 50 MG tablet Take 1 tablet (50 mg total) by mouth daily. 90 tablet 0   mirtazapine (REMERON) 7.5 MG tablet TAKE 1 TABLET(7.5 MG) BY MOUTH AT BEDTIME 90 tablet 1   Nutritional Supplements (NUTRITIONAL SUPPLEMENT PO) Take by mouth. Super Beta Prostate Advanced     OXYGEN Inhale into the lungs at bedtime.     phenytoin (DILANTIN) 100 MG ER capsule TAKE 3 CAPSULES EVERY DAY 270 capsule 0   No current facility-administered medications for this visit.    Allergies  Allergen Reactions   Nsaids Other (See Comments)    GI bleed and vomiting    Family History  Problem Relation Age of Onset   Heart disease Mother    Heart disease Father    Cancer Sister    Stroke Neg Hx     Social History   Socioeconomic History   Marital status: Married  Spouse name: Not on file   Number of children: Not on file   Years of education: Not on file   Highest education level: Not on file  Occupational History   Not on file  Tobacco Use   Smoking status: Every Day    Packs/day: 1.00    Years: 51.00    Pack years: 51.00    Types: Cigarettes   Smokeless tobacco: Never   Tobacco comments:    1 ppd/ 01/15/21  Vaping Use   Vaping Use: Never used  Substance and Sexual Activity   Alcohol use: No    Alcohol/week: 0.0 standard drinks   Drug use: Not Currently    Types: Marijuana    Comment: 2 month   Sexual activity: Yes    Partners: Male  Other Topics Concern   Not on file  Social History Narrative   Married, 2 grown children   Eighth grade  education.  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   Originally from [place].   Tobacco 100 pack-yr hx, still smoking as of 10/2011 (1 pack per day).   Denies alcohol or drug use.   No exercise.   Social Determinants of Health   Financial Resource Strain: Not on file  Food Insecurity: Not on file  Transportation Needs: Not on file  Physical Activity: Not on file  Stress: Not on file  Social Connections: Not on file  Intimate Partner Violence: Not on file     Constitutional: Denies fever, malaise, fatigue, headache or abrupt weight changes.  HEENT: Denies eye pain, eye redness, ear pain, ringing in the ears, wax buildup, runny nose, nasal congestion, bloody nose, or sore throat. Respiratory: Patient reports chronic cough and shortness of breath.  Denies difficulty breathing.   Cardiovascular: Denies chest pain, chest tightness, palpitations or swelling in the hands or feet.  Gastrointestinal: Denies abdominal pain, bloating, constipation, diarrhea or blood in the stool.  GU: Denies urgency, frequency, pain with urination, burning sensation, blood in urine, odor or discharge. Musculoskeletal: Patient reports joint pain.  Denies decrease in range of motion, difficulty with gait, muscle pain or joint swelling.  Skin: Denies redness, rashes, lesions or ulcercations.  Neurological: Patient reports insomnia.  Denies dizziness, difficulty with memory, difficulty with speech or problems with balance and coordination.  Psych: Patient has history of anxiety.  Denies depression, SI/HI.  No other specific complaints in a complete review of systems (except as listed in HPI above).  Objective:   Physical Exam   BP 122/70 (BP Location: Left Arm, Patient Position: Sitting, Cuff Size: Normal)    Pulse 71    Temp 97.7 F (36.5 C) (Temporal)    Ht 5' 8" (1.727 m)    Wt 130 lb 3.2 oz (59.1 kg)    SpO2 95%    BMI 19.80 kg/m   Wt Readings from Last 3 Encounters:  01/26/21 133  lb (60.3 kg)  01/15/21 133 lb 3.2 oz (60.4 kg)  09/03/20 128 lb (58.1 kg)    General: Appears his stated age, in NAD. Skin: Warm, dry and intact. HEENT: Head: normal shape and size; Eyes: EOMs intact;  Neck:  Neck supple, trachea midline. No masses, lumps or thyromegaly present.  Cardiovascular: Normal rate and rhythm. S1,S2 noted.  No murmur, rubs or gallops noted. No JVD or BLE edema. No carotid bruits noted. Pulmonary/Chest: Normal effort and positive vesicular breath sounds. No respiratory distress. No wheezes, rales or ronchi noted.  Abdomen: Soft and nontender. Normal bowel  sounds.  Musculoskeletal: Muscle wasting noted.  Strength 4/5 BUE/BLE. No difficulty with gait.  Neurological: Alert and oriented. Cranial nerves II-XII grossly intact. Coordination normal.  Psychiatric: Mood and affect normal. Anxious appearing. Judgment and thought content normal.    BMET    Component Value Date/Time   NA 138 02/05/2020 1252   K 4.4 02/05/2020 1252   CL 102 02/05/2020 1252   CO2 31 02/05/2020 1252   GLUCOSE 78 02/05/2020 1252   BUN 11 02/05/2020 1252   CREATININE 0.80 02/05/2020 1252   CREATININE 0.82 11/17/2018 1554   CALCIUM 9.6 02/05/2020 1252   GFRNONAA >60 03/10/2018 0506   GFRAA >60 03/10/2018 0506    Lipid Panel     Component Value Date/Time   CHOL 182 02/05/2020 1252   TRIG 130.0 02/05/2020 1252   HDL 62.20 02/05/2020 1252   CHOLHDL 3 02/05/2020 1252   VLDL 26.0 02/05/2020 1252   LDLCALC 94 02/05/2020 1252   LDLCALC 127 (H) 11/17/2018 1554    CBC    Component Value Date/Time   WBC 9.7 02/05/2020 1252   RBC 4.51 02/05/2020 1252   HGB 13.9 02/05/2020 1252   HCT 42.0 02/05/2020 1252   PLT 222.0 02/05/2020 1252   MCV 93.2 02/05/2020 1252   MCH 30.8 11/17/2018 1554   MCHC 33.1 02/05/2020 1252   RDW 14.0 02/05/2020 1252   LYMPHSABS 1.0 03/06/2018 1235   MONOABS 1.0 03/06/2018 1235   EOSABS 0.3 03/06/2018 1235   BASOSABS 0.0 03/06/2018 1235    Hgb A1C Lab  Results  Component Value Date   HGBA1C 5.6 02/05/2020           Assessment & Plan:   Preventative Health Maintenance:  Flu shot today He declines tetanus for financial reasons, advised if he gets bit or cut he will need to get this done at pharmacy Pneumovax and Prevnar UTD Encouraged him to get his COVID booster Discussed Shingrix vaccine, if he would like to get this he can have it done at the pharmacy Colon screening UTD Encouraged him to consume a balanced diet and exercise regimen Advised him to see an eye doctor and dentist annually We will check CBC, c-Met, lipid, A1c, PSA today  RTC in 6 months, follow-up chronic conditions  Webb Silversmith, NP This visit occurred during the SARS-CoV-2 public health emergency.  Safety protocols were in place, including screening questions prior to the visit, additional usage of staff PPE, and extensive cleaning of exam room while observing appropriate contact time as indicated for disinfecting solutions.

## 2021-03-10 NOTE — Addendum Note (Signed)
Addended by: Wilson Singer on: 03/10/2021 01:47 PM   Modules accepted: Orders

## 2021-03-10 NOTE — Assessment & Plan Note (Signed)
Did not tolerate Mirtazapine secondary to nightmares We will trial Lunesta 1 mg nightly.

## 2021-03-10 NOTE — Assessment & Plan Note (Signed)
C-Met and lipid profile today Encouraged him to consume a low-fat diet 

## 2021-03-11 LAB — LIPID PANEL
Cholesterol: 192 mg/dL (ref <200)
HDL: 66 mg/dL (ref >=40)
LDL Cholesterol (Calc): 103 mg/dL (calc) — ABNORMAL HIGH
Non-HDL Cholesterol (Calc): 126 mg/dL (calc) (ref <130)
Total CHOL/HDL Ratio: 2.9 (calc) (ref <5.0)
Triglycerides: 124 mg/dL (ref <150)

## 2021-03-11 LAB — COMPLETE METABOLIC PANEL WITH GFR
AG Ratio: 1.7 (calc) (ref 1.0–2.5)
ALT: 34 U/L (ref 9–46)
AST: 28 U/L (ref 10–35)
Albumin: 4.7 g/dL (ref 3.6–5.1)
Alkaline phosphatase (APISO): 58 U/L (ref 35–144)
BUN: 9 mg/dL (ref 7–25)
CO2: 33 mmol/L — ABNORMAL HIGH (ref 20–32)
Calcium: 9.6 mg/dL (ref 8.6–10.3)
Chloride: 103 mmol/L (ref 98–110)
Creat: 0.84 mg/dL (ref 0.70–1.35)
Globulin: 2.7 g/dL (calc) (ref 1.9–3.7)
Glucose, Bld: 87 mg/dL (ref 65–99)
Potassium: 5 mmol/L (ref 3.5–5.3)
Sodium: 140 mmol/L (ref 135–146)
Total Bilirubin: 0.4 mg/dL (ref 0.2–1.2)
Total Protein: 7.4 g/dL (ref 6.1–8.1)
eGFR: 95 mL/min/{1.73_m2} (ref >=60)

## 2021-03-11 LAB — CBC
HCT: 40.8 % (ref 38.5–50.0)
Hemoglobin: 13.6 g/dL (ref 13.2–17.1)
MCH: 30.9 pg (ref 27.0–33.0)
MCHC: 33.3 g/dL (ref 32.0–36.0)
MCV: 92.7 fL (ref 80.0–100.0)
MPV: 11.2 fL (ref 7.5–12.5)
Platelets: 222 10*3/uL (ref 140–400)
RBC: 4.4 10*6/uL (ref 4.20–5.80)
RDW: 13.2 % (ref 11.0–15.0)
WBC: 8.5 10*3/uL (ref 3.8–10.8)

## 2021-03-11 LAB — HEMOGLOBIN A1C
Hgb A1c MFr Bld: 5.3 % of total Hgb (ref <5.7)
Mean Plasma Glucose: 105 mg/dL
eAG (mmol/L): 5.8 mmol/L

## 2021-03-11 LAB — PSA: PSA: 1.12 ng/mL (ref <=4.00)

## 2021-03-11 LAB — PHENYTOIN LEVEL, TOTAL: Phenytoin, Total: 10.3 mg/L (ref 10.0–20.0)

## 2021-03-17 DIAGNOSIS — J449 Chronic obstructive pulmonary disease, unspecified: Secondary | ICD-10-CM

## 2021-03-19 ENCOUNTER — Other Ambulatory Visit: Payer: Self-pay | Admitting: Internal Medicine

## 2021-03-19 DIAGNOSIS — I10 Essential (primary) hypertension: Secondary | ICD-10-CM

## 2021-03-19 NOTE — Telephone Encounter (Signed)
Requested medication (s) are due for refill today: Yes  Requested medication (s) are on the active medication list: Yes  Last refill:  02/18/21  Future visit scheduled: Yes  Notes to clinic:  See request.    Requested Prescriptions  Pending Prescriptions Disp Refills   ALPRAZolam (XANAX) 0.5 MG tablet [Pharmacy Med Name: ALPRAZOLAM 0.5MG  TABLETS] 90 tablet     Sig: TAKE 1 TABLET BY MOUTH THREE TIMES DAILY     Not Delegated - Psychiatry: Anxiolytics/Hypnotics 2 Failed - 03/19/2021  9:02 AM      Failed - This refill cannot be delegated      Failed - Urine Drug Screen completed in last 360 days      Passed - Patient is not pregnant      Passed - Valid encounter within last 6 months    Recent Outpatient Visits           1 week ago Encounter for general adult medical examination with abnormal findings   [facility] [place], Coralie Keens, NP   8 months ago Anxiety state   [facility] [place], Coralie Keens, NP              Signed Prescriptions Disp Refills   losartan (COZAAR) 50 MG tablet 90 tablet 0    Sig: TAKE 1 TABLET(50 MG) BY MOUTH DAILY     Cardiovascular:  Angiotensin Receptor Blockers Passed - 03/19/2021  9:02 AM      Passed - Cr in normal range and within 180 days    Creat  Date Value Ref Range Status  03/10/2021 0.84 0.70 - 1.35 mg/dL Final   Creatinine,U  Date Value Ref Range Status  11/03/2012 46.42 mg/dL Final    Comment:       Cutoff Values for Urine Drug Screen, Pain Mgmt          Drug Class           Cutoff (ng/mL)          Amphetamines             500          Barbiturates             200          Cocaine Metabolites      150          Benzodiazepines          200          Methadone                300          Opiates                  300          Phencyclidine             25          Propoxyphene             300          Marijuana Metabolites     50    For medical purposes only.          Passed - K in normal range and  within 180 days    Potassium  Date Value Ref Range Status  03/10/2021 5.0 3.5 - 5.3 mmol/L Final          Passed - Patient is not pregnant  Passed - Last BP in normal range    BP Readings from Last 1 Encounters:  03/10/21 122/70          Passed - Valid encounter within last 6 months    Recent Outpatient Visits           1 week ago Encounter for general adult medical examination with abnormal findings   [facility] [place], Coralie Keens, NP   8 months ago Anxiety state   [facility] [place], Coralie Keens, [place]

## 2021-03-19 NOTE — Telephone Encounter (Signed)
Requested Prescriptions  Pending Prescriptions Disp Refills   ALPRAZolam (XANAX) 0.5 MG tablet [Pharmacy Med Name: ALPRAZOLAM 0.5MG  TABLETS] 90 tablet     Sig: TAKE 1 TABLET BY MOUTH THREE TIMES DAILY     Not Delegated - Psychiatry: Anxiolytics/Hypnotics 2 Failed - 03/19/2021  9:02 AM      Failed - This refill cannot be delegated      Failed - Urine Drug Screen completed in last 360 days      Passed - Patient is not pregnant      Passed - Valid encounter within last 6 months    Recent Outpatient Visits          1 week ago Encounter for general adult medical examination with abnormal findings   [facility] [place], Coralie Keens, NP   8 months ago Anxiety state   [facility] [place], Coralie Keens, NP              losartan (COZAAR) 50 MG tablet [Pharmacy Med Name: LOSARTAN 50MG  TABLETS] 90 tablet 0    Sig: TAKE 1 TABLET(50 MG) BY MOUTH DAILY     Cardiovascular:  Angiotensin Receptor Blockers Passed - 03/19/2021  9:02 AM      Passed - Cr in normal range and within 180 days    Creat  Date Value Ref Range Status  03/10/2021 0.84 0.70 - 1.35 mg/dL Final   Creatinine,U  Date Value Ref Range Status  11/03/2012 46.42 mg/dL Final    Comment:       Cutoff Values for Urine Drug Screen, Pain Mgmt          Drug Class           Cutoff (ng/mL)          Amphetamines             500          Barbiturates             200          Cocaine Metabolites      150          Benzodiazepines          200          Methadone                300          Opiates                  300          Phencyclidine             25          Propoxyphene             300          Marijuana Metabolites     50    For medical purposes only.         Passed - K in normal range and within 180 days    Potassium  Date Value Ref Range Status  03/10/2021 5.0 3.5 - 5.3 mmol/L Final         Passed - Patient is not pregnant      Passed - Last BP in normal range    BP Readings from Last 1  Encounters:  03/10/21 122/70         Passed - Valid encounter within last 6 months    Recent Outpatient Visits  1 week ago Encounter for general adult medical examination with abnormal findings   [facility] [place], Coralie Keens, NP   8 months ago Anxiety state   [facility] [place], Coralie Keens, [place]

## 2021-04-17 ENCOUNTER — Other Ambulatory Visit: Payer: Self-pay | Admitting: Internal Medicine

## 2021-04-17 NOTE — Telephone Encounter (Signed)
Requested medication (s) are due for refill today -yes ? ?Requested medication (s) are on the active medication list -yes ? ?Future visit scheduled -no ? ?Last refill: 03/19/21 #90 ? ?Notes to clinic: Request RF: non delegated Rx ? ?Requested Prescriptions  ?Pending Prescriptions Disp Refills  ? ALPRAZolam (XANAX) 0.5 MG tablet [Pharmacy Med Name: ALPRAZOLAM 0.5MG  TABLETS] 90 tablet   ?  Sig: TAKE 1 TABLET BY MOUTH THREE TIMES DAILY  ?  ? Not Delegated - Psychiatry: Anxiolytics/Hypnotics 2 Failed - 04/17/2021  9:01 AM  ?  ?  Failed - This refill cannot be delegated  ?  ?  Failed - Urine Drug Screen completed in last 360 days  ?  ?  Passed - Patient is not pregnant  ?  ?  Passed - Valid encounter within last 6 months  ?  Recent Outpatient Visits   ? ?      ? 1 month ago Encounter for general adult medical examination with abnormal findings  ? [facility] [place], Coralie Keens, NP  ? 9 months ago Anxiety state  ? [facility] [place], Coralie Keens, NP  ? ?  ?  ? ?  ?  ?  ? ? ? ?Requested Prescriptions  ?Pending Prescriptions Disp Refills  ? ALPRAZolam (XANAX) 0.5 MG tablet [Pharmacy Med Name: ALPRAZOLAM 0.5MG  TABLETS] 90 tablet   ?  Sig: TAKE 1 TABLET BY MOUTH THREE TIMES DAILY  ?  ? Not Delegated - Psychiatry: Anxiolytics/Hypnotics 2 Failed - 04/17/2021  9:01 AM  ?  ?  Failed - This refill cannot be delegated  ?  ?  Failed - Urine Drug Screen completed in last 360 days  ?  ?  Passed - Patient is not pregnant  ?  ?  Passed - Valid encounter within last 6 months  ?  Recent Outpatient Visits   ? ?      ? 1 month ago Encounter for general adult medical examination with abnormal findings  ? [facility] [place], Coralie Keens, NP  ? 9 months ago Anxiety state  ? [place], Coralie Keens, NP  ? ?  ?  ? ?  ?  ?  ? ? ? ?

## 2021-04-22 ENCOUNTER — Other Ambulatory Visit: Payer: Self-pay | Admitting: Pulmonary Disease

## 2021-04-23 ENCOUNTER — Other Ambulatory Visit: Payer: Self-pay | Admitting: Pulmonary Disease

## 2021-05-11 ENCOUNTER — Other Ambulatory Visit: Payer: Self-pay | Admitting: Internal Medicine

## 2021-05-11 DIAGNOSIS — F411 Generalized anxiety disorder: Secondary | ICD-10-CM

## 2021-05-12 NOTE — Telephone Encounter (Signed)
Requested medication (s) are due for refill today: yes (mail order) ? ?Requested medication (s) are on the active medication list: yes ? ?Last refill:  12/09/20 #270 with 0 RF ? ?Future visit scheduled: no ? ?Notes to clinic:  This medication can not be delegated, please assess.  ? ? ? ?  ? ?Requested Prescriptions  ?Pending Prescriptions Disp Refills  ? phenytoin (DILANTIN) 100 MG ER capsule [Pharmacy Med Name: PHENYTOIN SODIUM EXTENDED 100 MG Capsule] 270 capsule 0  ?  Sig: TAKE 3 CAPSULES EVERY DAY  ?  ? Not Delegated - Neurology:  Anticonvulsants - phenytoin Failed - 05/11/2021 10:34 AM  ?  ?  Failed - This refill cannot be delegated  ?  ?  Passed - ALT in normal range and within 360 days  ?  ALT  ?Date Value Ref Range Status  ?03/10/2021 34 9 - 46 U/L Final  ?  ?  ?  ?  Passed - AST in normal range and within 360 days  ?  AST  ?Date Value Ref Range Status  ?03/10/2021 28 10 - 35 U/L Final  ?  ?  ?  ?  Passed - HGB in normal range and within 360 days  ?  Hemoglobin  ?Date Value Ref Range Status  ?03/10/2021 13.6 13.2 - 17.1 g/dL Final  ?  ?  ?  ?  Passed - HCT in normal range and within 360 days  ?  HCT  ?Date Value Ref Range Status  ?03/10/2021 40.8 38.5 - 50.0 % Final  ?  ?  ?  ?  Passed - PLT in normal range and within 360 days  ?  Platelets  ?Date Value Ref Range Status  ?03/10/2021 222 140 - 400 Thousand/uL Final  ?  ?  ?  ?  Passed - WBC in normal range and within 360 days  ?  WBC  ?Date Value Ref Range Status  ?03/10/2021 8.5 3.8 - 10.8 Thousand/uL Final  ?  ?  ?  ?  Passed - Phenytoin (serum) in normal range and within 360 days  ?  Phenytoin, Total  ?Date Value Ref Range Status  ?03/10/2021 10.3 10.0 - 20.0 mg/L Final  ?  ?  ?  ?  Passed - Completed PHQ-2 or PHQ-9 in the last 360 days  ?  ?  Passed - Patient is not pregnant  ?  ?  Passed - Valid encounter within last 12 months  ?  Recent Outpatient Visits   ? ?      ? 2 months ago Encounter for general adult medical examination with abnormal findings  ?  [facility] [place], Coralie Keens, NP  ? 9 months ago Anxiety state  ? [facility] [place], Coralie Keens, NP  ? ?  ?  ? ?  ?  ?  ?Signed Prescriptions Disp Refills  ? atorvastatin (LIPITOR) 20 MG tablet 90 tablet 0  ?  Sig: TAKE 1 TABLET EVERY DAY  ?  ? Cardiovascular:  Antilipid - Statins Failed - 05/11/2021 10:34 AM  ?  ?  Failed - Lipid Panel in normal range within the last 12 months  ?  Cholesterol  ?Date Value Ref Range Status  ?03/10/2021 192 <200 mg/dL Final  ? ?LDL Cholesterol (Calc)  ?Date Value Ref Range Status  ?03/10/2021 103 (H) mg/dL (calc) Final  ?  Comment:  ?  Reference range: <100 ?Marland Kitchen ?Desirable range <100 mg/dL for primary prevention;   ?<70  mg/dL for patients with CHD or diabetic patients  ?with > or = 2 CHD risk factors. ?. ?LDL-C is now calculated using the Martin-Hopkins  ?calculation, which is a validated novel method providing  ?better accuracy than the Friedewald equation in the  ?estimation of LDL-C.  ?Cresenciano Genre et al. Annamaria Helling. 3212;248(25): 2061-2068  ?(http://education.[facility].com/faq/FAQ164) ?  ? ?HDL  ?Date Value Ref Range Status  ?03/10/2021 66 > OR = 40 mg/dL Final  ? ?Triglycerides  ?Date Value Ref Range Status  ?03/10/2021 124 <150 mg/dL Final  ? ?  ?  ?  Passed - Patient is not pregnant  ?  ?  Passed - Valid encounter within last 12 months  ?  Recent Outpatient Visits   ? ?      ? 2 months ago Encounter for general adult medical examination with abnormal findings  ? [facility] [place], Coralie Keens, NP  ? 9 months ago Anxiety state  ? [facility] [place], [place] W, NP  ? ?  ?  ? ?  ?  ?  ? citalopram (CELEXA) 40 MG tablet 90 tablet 0  ?  Sig: TAKE 1 TABLET EVERY DAY  ?  ? Psychiatry:  Antidepressants - SSRI Passed - 05/11/2021 10:34 AM  ?  ?  Passed - Valid encounter within last 6 months  ?  Recent Outpatient Visits   ? ?      ? 2 months ago Encounter for general adult medical examination with abnormal findings  ? [facility] [place], Coralie Keens, NP  ? 9 months ago Anxiety state  ? [facility] [place], Coralie Keens, NP  ? ?  ?  ? ?  ?  ?  ? ? ?

## 2021-05-12 NOTE — Telephone Encounter (Signed)
Requested Prescriptions  ?Pending Prescriptions Disp Refills  ?? phenytoin (DILANTIN) 100 MG ER capsule [Pharmacy Med Name: PHENYTOIN SODIUM EXTENDED 100 MG Capsule] 270 capsule 0  ?  Sig: TAKE 3 CAPSULES EVERY DAY  ?  ? Not Delegated - Neurology:  Anticonvulsants - phenytoin Failed - 05/11/2021 10:34 AM  ?  ?  Failed - This refill cannot be delegated  ?  ?  Passed - ALT in normal range and within 360 days  ?  ALT  ?Date Value Ref Range Status  ?03/10/2021 34 9 - 46 U/L Final  ?   ?  ?  Passed - AST in normal range and within 360 days  ?  AST  ?Date Value Ref Range Status  ?03/10/2021 28 10 - 35 U/L Final  ?   ?  ?  Passed - HGB in normal range and within 360 days  ?  Hemoglobin  ?Date Value Ref Range Status  ?03/10/2021 13.6 13.2 - 17.1 g/dL Final  ?   ?  ?  Passed - HCT in normal range and within 360 days  ?  HCT  ?Date Value Ref Range Status  ?03/10/2021 40.8 38.5 - 50.0 % Final  ?   ?  ?  Passed - PLT in normal range and within 360 days  ?  Platelets  ?Date Value Ref Range Status  ?03/10/2021 222 140 - 400 Thousand/uL Final  ?   ?  ?  Passed - WBC in normal range and within 360 days  ?  WBC  ?Date Value Ref Range Status  ?03/10/2021 8.5 3.8 - 10.8 Thousand/uL Final  ?   ?  ?  Passed - Phenytoin (serum) in normal range and within 360 days  ?  Phenytoin, Total  ?Date Value Ref Range Status  ?03/10/2021 10.3 10.0 - 20.0 mg/L Final  ?   ?  ?  Passed - Completed PHQ-2 or PHQ-9 in the last 360 days  ?  ?  Passed - Patient is not pregnant  ?  ?  Passed - Valid encounter within last 12 months  ?  Recent Outpatient Visits   ?      ? 2 months ago Encounter for general adult medical examination with abnormal findings  ? [facility] [place], Coralie Keens, NP  ? 9 months ago Anxiety state  ? [facility] [place], [place] W, NP  ?  ?  ? ?  ?  ?  ?? atorvastatin (LIPITOR) 20 MG tablet [Pharmacy Med Name: ATORVASTATIN CALCIUM 20 MG Tablet] 90 tablet 0  ?  Sig: TAKE 1 TABLET EVERY DAY  ?  ? Cardiovascular:   Antilipid - Statins Failed - 05/11/2021 10:34 AM  ?  ?  Failed - Lipid Panel in normal range within the last 12 months  ?  Cholesterol  ?Date Value Ref Range Status  ?03/10/2021 192 <200 mg/dL Final  ? ?LDL Cholesterol (Calc)  ?Date Value Ref Range Status  ?03/10/2021 103 (H) mg/dL (calc) Final  ?  Comment:  ?  Reference range: <100 ?Marland Kitchen ?Desirable range <100 mg/dL for primary prevention;   ?<70 mg/dL for patients with CHD or diabetic patients  ?with > or = 2 CHD risk factors. ?. ?LDL-C is now calculated using the Martin-Hopkins  ?calculation, which is a validated novel method providing  ?better accuracy than the Friedewald equation in the  ?estimation of LDL-C.  ?Cresenciano Genre et al. Annamaria Helling. 8101;751(02): 2061-2068  ?(http://education.[facility].com/faq/FAQ164) ?  ? ?HDL  ?  Date Value Ref Range Status  ?03/10/2021 66 > OR = 40 mg/dL Final  ? ?Triglycerides  ?Date Value Ref Range Status  ?03/10/2021 124 <150 mg/dL Final  ? ?  ?  ?  Passed - Patient is not pregnant  ?  ?  Passed - Valid encounter within last 12 months  ?  Recent Outpatient Visits   ?      ? 2 months ago Encounter for general adult medical examination with abnormal findings  ? [facility] [place], Coralie Keens, NP  ? 9 months ago Anxiety state  ? [facility] [place], [place] W, NP  ?  ?  ? ?  ?  ?  ?? citalopram (CELEXA) 40 MG tablet [Pharmacy Med Name: CITALOPRAM HYDROBROMIDE 40 MG Tablet] 90 tablet 0  ?  Sig: TAKE 1 TABLET EVERY DAY  ?  ? Psychiatry:  Antidepressants - SSRI Passed - 05/11/2021 10:34 AM  ?  ?  Passed - Valid encounter within last 6 months  ?  Recent Outpatient Visits   ?      ? 2 months ago Encounter for general adult medical examination with abnormal findings  ? [facility] [place], Coralie Keens, NP  ? 9 months ago Anxiety state  ? [facility] [place], Coralie Keens, NP  ?  ?  ? ?  ?  ?  ? ? ?

## 2021-05-18 ENCOUNTER — Other Ambulatory Visit: Payer: Self-pay | Admitting: Internal Medicine

## 2021-05-19 ENCOUNTER — Other Ambulatory Visit: Payer: Self-pay | Admitting: Internal Medicine

## 2021-05-19 NOTE — Telephone Encounter (Signed)
Copied from [place] [id number]. Topic: Quick Communication - Rx Refill/Question ?>> May 19, 2021 10:50 AM Tessa Lerner A wrote: ?Medication: ALPRAZolam [name]) 0.5 MG tablet [[id number]]  ? ?Has the patient contacted their pharmacy? No. ?(Agent: If no, request that the patient contact the pharmacy for the refill. If patient does not wish to contact the pharmacy document the reason why and proceed with request.) ?(Agent: If yes, when and what did the pharmacy advise?) ? ?Preferred Pharmacy (with phone number or street name): [facility] [place], [address] ?[place] [address] ?Phone: [phone] Fax: [phone] ?Hours: Not open 24 hours ? ? ?Has the patient been seen for an appointment in the last year OR does the patient have an upcoming appointment? Yes.   ? ?Agent: Please be advised that RX refills may take up to 3 business days. We ask that you follow-up with your pharmacy. ?

## 2021-05-19 NOTE — Telephone Encounter (Signed)
Requested medication (s) are due for refill today: yes ? ?Requested medication (s) are on the active medication list: yes ? ?Last refill:  04/17/21 #90/0 ? ?Future visit scheduled: no ? ?Notes to clinic:  Unable to refill per protocol, cannot delegate. ? ?  ?Requested Prescriptions  ?Pending Prescriptions Disp Refills  ? ALPRAZolam (XANAX) 0.5 MG tablet [Pharmacy Med Name: ALPRAZOLAM 0.5MG  TABLETS] 90 tablet   ?  Sig: TAKE 1 TABLET BY MOUTH THREE TIMES DAILY  ?  ? Not Delegated - Psychiatry: Anxiolytics/Hypnotics 2 Failed - 05/18/2021  9:33 AM  ?  ?  Failed - This refill cannot be delegated  ?  ?  Failed - Urine Drug Screen completed in last 360 days  ?  ?  Passed - Patient is not pregnant  ?  ?  Passed - Valid encounter within last 6 months  ?  Recent Outpatient Visits   ? ?      ? 2 months ago Encounter for general adult medical examination with abnormal findings  ? [facility] [place], Coralie Keens, NP  ? 10 months ago Anxiety state  ? [facility] [place], Coralie Keens, NP  ? ?  ?  ? ?  ?  ?  ? ?

## 2021-05-20 NOTE — Telephone Encounter (Signed)
Requested medication (s) are due for refill today:   Provider to review ? ?Requested medication (s) are on the active medication list:   Yes ? ?Future visit scheduled:   No   Seen 2 mo. ago ? ? ?Last ordered: 05/19/2021 #90, 0 refills ? ?Returned because it's a non delegated refill per protocol.  ? ?Requested Prescriptions  ?Pending Prescriptions Disp Refills  ? ALPRAZolam (XANAX) 0.5 MG tablet 90 tablet 0  ?  Sig: Take 1 tablet (0.5 mg total) by mouth 3 (three) times daily.  ?  ? Not Delegated - Psychiatry: Anxiolytics/Hypnotics 2 Failed - 05/19/2021 11:21 AM  ?  ?  Failed - This refill cannot be delegated  ?  ?  Failed - Urine Drug Screen completed in last 360 days  ?  ?  Passed - Patient is not pregnant  ?  ?  Passed - Valid encounter within last 6 months  ?  Recent Outpatient Visits   ? ?      ? 2 months ago Encounter for general adult medical examination with abnormal findings  ? [facility] [place], Coralie Keens, NP  ? 10 months ago Anxiety state  ? [facility] [place], Coralie Keens, NP  ? ?  ?  ? ?  ?  ?  ? ?

## 2021-06-01 ENCOUNTER — Ambulatory Visit (INDEPENDENT_AMBULATORY_CARE_PROVIDER_SITE_OTHER): Payer: Medicare HMO | Admitting: Pharmacist

## 2021-06-01 DIAGNOSIS — F5101 Primary insomnia: Secondary | ICD-10-CM

## 2021-06-01 DIAGNOSIS — J449 Chronic obstructive pulmonary disease, unspecified: Secondary | ICD-10-CM

## 2021-06-01 NOTE — Progress Notes (Signed)
Collaborated with PCP regarding alternative therapy options as Lunesta/eszopiclone not covered through patient's Part D coverage. Recommended provider consider trial of low-dose melatonin. Provider agreed ? ?Follow up with patient today. Counsel on sleep hygiene to help with insomnia. Also discuss use of OTC low dose melatonin (1-2 mg) nightly, taken 30-60 minutes prior to bedtime trial for ~ 1 month ?Patient verbalizes understanding. States he will consider trying low-dose melatonin  ? ?Wallace Cullens, PharmD, BCACP, CPP ?Clinical Pharmacist ?[facility] ?[place] ?[phone] ? ?

## 2021-06-01 NOTE — Patient Instructions (Signed)
Visit Information ? ?Thank you for taking time to visit with me today. Please don't hesitate to contact me if I can be of assistance to you before our next scheduled telephone appointment. ? ?Following are the goals we discussed today:  ? Goals Addressed   ? ?  ?  ?  ?  ? This Visit's Progress  ?  Pharmacy Goals     ?  Please use your Breztri 2 puffs twice a day.  Make sure you rinse your mouth well after you use it. ? ?Please consider calling the [place] Quitline for their help with quitting smoking.  ?The [place] Quitline phone number is: [phone] ? ?Feel free to call me with any questions or concerns. I look forward to our next call! ? ?Wallace Cullens, PharmD, BCACP ?Clinical Pharmacist ?[facility] ?[place] ?[phone] ?  ? ?  ? ? ? ?Our next appointment is by telephone on 08/31/2021 at 1:00 PM ? ?Please call the care guide team at [phone] if you need to cancel or reschedule your appointment.  ? ?The patient verbalized understanding of instructions, educational materials, and care plan provided today and declined offer to receive copy of patient instructions, educational materials, and care plan.  ? ?

## 2021-06-01 NOTE — Chronic Care Management (AMB) (Signed)
? ?Chronic Care Management ?CCM Pharmacy Note ? ?06/01/2021 ?Name:  Elijah Mcfarland MRN:  [id number] DOB:  October 22, 1950 ? ? ?Subjective: ?Elijah Mcfarland is an 71 y.o. year old male who is a primary patient of [name], NP.  The CCM team was consulted for assistance with disease management and care coordination needs.   ? ?Engaged with patient by telephone for follow up visit for pharmacy case management and/or care coordination services.  ? ?Objective: ? ?Medications Reviewed Today   ? ? Reviewed by Rennis Petty, RPH-CPP (Pharmacist) on 06/01/21 at 1500  Med List Status: <None>  ? ?Medication Order Taking? Sig Documenting Provider Last Dose Status Informant  ?acetaminophen (TYLENOL) 325 MG tablet [id number]  Take 2 tablets (650 mg total) by mouth every 6 (six) hours as needed for mild pain or headache (or Fever >/= 101). Roxan Hockey, MD  Active Self  ?albuterol (VENTOLIN HFA) 108 (90 Base) MCG/ACT inhaler [id number] Yes INHALE 2 PUFFS INTO THE LUNGS EVERY 6 HOURS AS NEEDED FOR WHEEZING OR SHORTNESS OF [place] Blas, MD Taking Active   ?ALPRAZolam (XANAX) 0.5 MG tablet [id number]  TAKE 1 TABLET BY MOUTH THREE TIMES DAILY Garnette Gunner Coralie Keens, NP  Active   ?atorvastatin (LIPITOR) 20 MG tablet [id number]  TAKE 1 TABLET EVERY DAY Baity, Coralie Keens, NP  Active   ?Budeson-Glycopyrrol-Formoterol (BREZTRI AEROSPHERE) 160-9-4.8 MCG/ACT AERO [id number] Yes Inhale 2 puffs into the lungs in the morning and at bedtime. Tyler Pita, MD Taking Active   ?citalopram (CELEXA) 40 MG tablet [id number]  TAKE 1 TABLET EVERY DAY Baity, Coralie Keens, NP  Active   ?eszopiclone (LUNESTA) 1 MG TABS tablet [id number] No Take 1 tablet (1 mg total) by mouth at bedtime as needed for sleep. Take immediately before bedtime  ?Patient not taking: Reported on 06/01/2021  ? [name], NP Not Taking Active   ?loratadine (CLARITIN) 10 MG tablet [id number]  Take 10 mg by mouth daily. [provider]  Active   ?losartan (COZAAR)  50 MG tablet [id number]  TAKE 1 TABLET(50 MG) BY MOUTH DAILY Baity, Coralie Keens, NP  Active   ?Nutritional Supplements (NUTRITIONAL SUPPLEMENT PO) [id number]  Take by mouth. Super Beta Prostate Advanced [provider]  Active   ?OXYGEN [id number]  Inhale into the lungs at bedtime. [provider]  Active Self  ?phenytoin (DILANTIN) 100 MG ER capsule [id number]  TAKE 3 CAPSULES EVERY DAY Baity, Coralie Keens, NP  Active   ?Med List Note Lurlean Nanny, [place] 08/07/19 3546): UDS/CSA 07/06/19 repeat 10/06/2019  ? ?  ?  ? ?  ? ? ?Pertinent Labs:  ?Lab Results  ?Component Value Date  ? HGBA1C 5.3 03/10/2021  ? ?Lab Results  ?Component Value Date  ? CHOL 192 03/10/2021  ? HDL 66 03/10/2021  ? LDLCALC 103 (H) 03/10/2021  ? TRIG 124 03/10/2021  ? CHOLHDL 2.9 03/10/2021  ? ?Lab Results  ?Component Value Date  ? CREATININE 0.84 03/10/2021  ? BUN 9 03/10/2021  ? NA 140 03/10/2021  ? K 5.0 03/10/2021  ? CL 103 03/10/2021  ? CO2 33 (H) 03/10/2021  ? ? ?SDOH:  (Social Determinants of Health) assessments and interventions performed:  ? ? ?CCM Care Plan ? ?Review of patient past medical history, allergies, medications, health status, including review of consultants reports, laboratory and other test data, was performed as part of comprehensive evaluation and provision of chronic care management services.  ? ?Care Plan : PharmD - Medication Assistance  ?Updates  made by Rennis Petty, RPH-CPP since 06/01/2021 12:00 AM  ?  ? ?Problem: Disease Progression   ?  ? ?Long-Range Goal: Disease Progression Prevented or Minimized   ?Start Date: 08/08/2020  ?Expected End Date: 11/06/2020  ?Recent Progress: On track  ?Priority: High  ?Note:   ?Current Barriers:  ?Unable to independently afford treatment regimen ?Reports cost of Trelegy inhaler unaffordable as currently in coverage gap of [employer] Medicare Part D plan ?Patient APPROVED for [name] patient assistance from [employer] 09/04/20-02/14/22 ?Patient is legally blind ? ?Pharmacist  Clinical Goal(s):  ?Over the next 90 days, patient will verbalize ability to afford treatment regimen through collaboration with PharmD and provider.  ? ?Interventions: ?1:1 collaboration with [name], NP regarding development and update of comprehensive plan of care as evidenced by provider attestation and co-signature ?Inter-disciplinary care team collaboration (see longitudinal plan of care) ?Perform chart review. Patient seen for Office Visit with PCP for annual exam on 03/10/2021 ?Patient declined tetanus vaccination for financial reasons ?Patient had stopped Mirtazapine secondary to nightmares ?Provider sent Rx for trial Lunesta 1 mg nightly ?Patient reports he was unable to obtain Lunesta Rx from his pharmacy ?Follow up with [facility]/review health plan Part D formulary. Lunesta/eszopiclone not filled both as not covered through Part D coverage AND out of stock ?Will collaborate with PCP regarding alternative therapy options ? ?COPD: ?Patient followed by [facility] ?Current treatment: ?Breztri - 2 puffs twice daily ?Albuterol as needed ?Confirms using Breztri inhaler twice daily (morning and evening) and rinsing mouth out after use ?Reports symptoms remain well-controlled with Breztri ? ?Tobacco Use: ?Reports currently smokes ~ 1 pack of cigarettes/day ?Patient not ready to set quit date today, but reports thinking about quitting ?Reports has obtained nicotine patches and plans to obtain nicotine gum as well for future quit attempt ?Counsel patient on nicotine therapy options, dosing and administration ?Advise patient that nicotine replacement products available through health plan over the counter benefit ?Ask patient to contact CCM Pharmacist if interested in further information on smoking cessation sooner or consider contacting [place] Quitline for support ? ?Health Maintenance: ?Encourage patient to obtain Tetanus and Shingrix vaccinations at his local pharmacy, particularly  as now in 2023 offered through Part D for no cost-sharing to people with Medicare prescription drug coverage ? ?Patient Goals/Self-Care Activities ?Over the next 90 days, patient will:  ?- collaborate with provider on medication access solutions ?- attend medical appointment as scheduled ? ?Follow Up Plan: Telephone follow up appointment with care management team member scheduled for: 08/31/2021 at 1:00 PM ?  ?  ? ?Wallace Cullens, PharmD, BCACP ?Clinical Pharmacist ?[facility] ?[place] ?[phone] ? ? ? ? ? ?

## 2021-06-02 ENCOUNTER — Ambulatory Visit: Payer: Medicare HMO | Admitting: Pulmonary Disease

## 2021-06-02 ENCOUNTER — Encounter: Payer: Self-pay | Admitting: Pulmonary Disease

## 2021-06-02 VITALS — BP 132/78 | HR 77 | Temp 98.2°F | Ht 68.0 in | Wt 125.8 lb

## 2021-06-02 DIAGNOSIS — R64 Cachexia: Secondary | ICD-10-CM | POA: Diagnosis not present

## 2021-06-02 DIAGNOSIS — J441 Chronic obstructive pulmonary disease with (acute) exacerbation: Secondary | ICD-10-CM | POA: Diagnosis not present

## 2021-06-02 DIAGNOSIS — J449 Chronic obstructive pulmonary disease, unspecified: Secondary | ICD-10-CM | POA: Diagnosis not present

## 2021-06-02 DIAGNOSIS — F17210 Nicotine dependence, cigarettes, uncomplicated: Secondary | ICD-10-CM | POA: Diagnosis not present

## 2021-06-02 MED ORDER — METHYLPREDNISOLONE 4 MG PO TBPK: ORAL_TABLET | ORAL | 0 refills | Status: DC

## 2021-06-02 MED ORDER — AZITHROMYCIN 250 MG PO TABS: ORAL_TABLET | ORAL | 0 refills | Status: AC

## 2021-06-02 NOTE — Progress Notes (Signed)
Subjective:    Patient ID: Elijah Mcfarland, male    DOB: 1951-02-05, 71 y.o.   MRN: [id number] Patient Care Team: Lorre Munroe, NP as PCP - General (Internal Medicine) Debbe Odea, MD as PCP - Cardiology (Cardiology) Ronney Asters Jackelyn Poling, RPH-CPP as Pharmacist Salena Saner, MD as Consulting Physician (Pulmonary Disease)  Chief Complaint  Patient presents with   Follow-up    SOB with exertion, prod cough with grey sputum and wheezing.     HPI Unknown is a 71 year old current smoker (half PPD) who presents for follow-up on COPD GOLD class III.  This is a scheduled visit.  He was last seen by me on on 15 January 2021 and at that time we rechallenged him with [facility].  He had been lost to follow-up since then. The patient has been on Breztri since his last visit and notices that this helps him, his dyspnea has been better controlled.  However over the last 2 to 3 weeks he has noted an increase cough productive of grayish sputum and wheezing.  Cough has also increased.  He has had no hemoptysis.  He has not had any fevers, chills or sweats.  Some chest tightness that is relieved with albuterol and only over the last several weeks.  No lower extremity edema or calf tenderness.  He has not had any gastroesophageal reflux symptoms.  He does not endorse any other symptomatology.  Review of Systems A 10 point review of systems was performed and it is as noted above otherwise negative.  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   Stage 3 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 04/25/2006   Arthritis 04/25/2006   Social  History   Tobacco Use   Smoking status: Every Day    Packs/day: 0.75    Years: 51.00    Additional pack years: 0.00    Total pack years: 38.25    Types: Cigarettes   Smokeless tobacco: Never   Tobacco comments:    8-10 cigarettes daily--03/18/2022  Substance Use Topics   Alcohol use: No    Alcohol/week: 0.0 standard drinks of alcohol   Allergies  Allergen Reactions   Nsaids Other (See Comments)    GI bleed and vomiting   Current Meds  Medication Sig   acetaminophen (TYLENOL) 325 MG tablet Take 2 tablets (650 mg total) by mouth every 6 (six) hours as needed for mild pain or headache (or Fever >/= 101).   albuterol (VENTOLIN HFA) 108 (90 Base) MCG/ACT inhaler INHALE 2 PUFFS INTO THE LUNGS EVERY 6 HOURS AS NEEDED FOR WHEEZING OR SHORTNESS OF BREATH   ALPRAZolam (XANAX) 0.5 MG tablet TAKE 1 TABLET BY MOUTH THREE TIMES DAILY   atorvastatin (LIPITOR) 20 MG tablet TAKE 1 TABLET EVERY DAY   Budeson-Glycopyrrol-Formoterol (BREZTRI AEROSPHERE) 160-9-4.8 MCG/ACT AERO Inhale 2 puffs into the lungs in the morning and at bedtime.   citalopram (CELEXA) 40 MG tablet TAKE 1 TABLET EVERY DAY   eszopiclone (LUNESTA) 1 MG TABS tablet Take 1 tablet (1 mg total) by mouth at bedtime as needed for sleep. Take immediately before bedtime   loratadine (CLARITIN) 10 MG tablet Take 10 mg by mouth daily.  losartan (COZAAR) 50 MG tablet TAKE 1 TABLET(50 MG) BY MOUTH DAILY   Nutritional Supplements (NUTRITIONAL SUPPLEMENT PO) Take by mouth. Super Beta Prostate Advanced   OXYGEN Inhale into the lungs at bedtime.   phenytoin (DILANTIN) 100 MG ER capsule TAKE 3 CAPSULES EVERY DAY   Immunization History  Administered Date(s) Administered   Fluad Quad(high Dose 65+) 11/17/2018, 02/05/2020, 03/10/2021   H1N1 01/05/2008   Influenza Whole 11/07/2007   Influenza, High Dose Seasonal PF 03/03/2018   Influenza, Seasonal, Injecte, Preservative Fre 04/13/2013   Influenza,inj,Quad PF,6+ Mos 11/14/2014, 02/19/2016,  02/18/2017   Moderna SARS-COV2 Booster Vaccination 12/26/2019   Moderna Sars-Covid-2 Vaccination 04/22/2019, 05/20/2019   Pneumococcal Conjugate-13 11/14/2014, 02/05/2020   Pneumococcal Polysaccharide-23 11/07/2007, 02/19/2016, 03/03/2018   Td 09/06/2008      Objective:   Physical Exam BP 132/78 (BP Location: Left Arm, Cuff Size: Normal)   Pulse 77   Temp 98.2 F (36.8 C) (Temporal)   Ht 5\' 8"  (1.727 m)   Wt 125 lb 12.8 oz (57.1 kg)   SpO2 99%   BMI 19.13 kg/m   SpO2: 99 % O2 Device: None (Room air)  GENERAL: Thin, well-developed male, no overt respiratory distress, fully ambulatory. HEAD: Normocephalic, atraumatic. EYES: Pupils equal, round, reactive to light.  No scleral icterus. MOUTH: Nose/mouth/throat not examined due to masking requirements for COVID 19. NECK: Supple. No thyromegaly. No nodules. No JVD.  Trachea midline PULMONARY: Symmetrical air entry, no accessory muscle use noted today, diffuse wheezes noted, no other adventitious sounds, breath sounds are coarse. CARDIOVASCULAR: S1 and S2. Regular rate and rhythm.  No overt murmurs, gallops or rubs noted. GASTROINTESTINAL: No distention.  Benign. MUSCULOSKELETAL: No joint deformity, no clubbing, no edema. NEUROLOGIC: No focal deficits, fluent speech, awake, alert. SKIN: Intact,warm,dry.  No rashes noted on limited exam. PSYCH: Normal mood and behavior  Ambulatory oximetry was performed:  At rest on room air oxygen saturation 98%, O2 sat nadir was 96%.  Heart rate remained between 74 to 91 bpm throughout the exercise.  Patient ambulated at moderate pace complaint of dyspnea, able to ambulate 750 feet.     Assessment & Plan:     ICD-10-CM   1. COPD with acute exacerbation (HCC)  J44.1    Methylprednisolone taper pack Azithromycin pack    2. 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 Tobacco dependence due to cigarettes  F17.210    Patient counseled regards to discontinuation of smoking Total counseling time 3 to 5 minutes     Orders Placed This Encounter  Procedures   Pulmonary Function Test [facility] Only    Standing Status:   Future    Number of Occurrences:   1    Standing Expiration Date:   06/03/2022    Scheduling Instructions:     Next available.    Order Specific Question:   Full PFT: includes the following: basic spirometry, spirometry pre & post bronchodilator, diffusion capacity (DLCO), lung volumes    Answer:   Full PFT   Meds ordered this encounter  Medications   methylPREDNISolone (MEDROL DOSEPAK) 4 MG TBPK tablet    Sig: Take as directed in the package.    Dispense:  21 tablet  Refill:  0   azithromycin (ZITHROMAX) 250 MG tablet    Sig: Take 2 tablets (500 mg) on  Day 1,  followed by 1 tablet (250 mg) once daily on Days 2 through 5.    Dispense:  6 each    Refill:  0   We will have the patient follow-up with our nurse practitioner in 3 to 4 weeks time.  Follow-up with me in 2 to 3 months time.  Will reassess his COPD with PFTs.  These will be done after his exacerbation has cleared.  Gailen Shelter, MD Advanced Bronchoscopy PCCM [facility]    *This note was dictated using voice recognition software/Dragon.  Despite best efforts to proofread, errors can occur which can change the meaning. Any transcriptional errors that result from this process are unintentional and may not be fully corrected at the time of dictation.

## 2021-06-02 NOTE — Patient Instructions (Signed)
We are going to get breathing test to check to see how severe your COPD is.  We will compare it to our prior test. ? ?I sent some medications to your pharmacy to help with your cough and shortness of breath. ? ?I will have one of the nurse practitioner see you in approximately 3 to 4 weeks time.  I will need to see you in 2 - 3 months time. ?

## 2021-06-12 ENCOUNTER — Ambulatory Visit: Payer: Medicare HMO | Admitting: Pulmonary Disease

## 2021-06-15 ENCOUNTER — Other Ambulatory Visit: Payer: Self-pay | Admitting: Internal Medicine

## 2021-06-15 DIAGNOSIS — I10 Essential (primary) hypertension: Secondary | ICD-10-CM

## 2021-06-16 ENCOUNTER — Other Ambulatory Visit: Payer: Self-pay | Admitting: Internal Medicine

## 2021-06-16 NOTE — Telephone Encounter (Signed)
Requested Prescriptions  ?Pending Prescriptions Disp Refills  ?? losartan (COZAAR) 50 MG tablet [Pharmacy Med Name: LOSARTAN 50MG  TABLETS] 90 tablet 0  ?  Sig: TAKE 1 TABLET(50 MG) BY MOUTH DAILY  ?  ? Cardiovascular:  Angiotensin Receptor Blockers Passed - 06/15/2021  3:32 AM  ?  ?  Passed - Cr in normal range and within 180 days  ?  Creat  ?Date Value Ref Range Status  ?03/10/2021 0.84 0.70 - 1.35 mg/dL Final  ? ?Creatinine,U  ?Date Value Ref Range Status  ?11/03/2012 46.42 mg/dL Final  ?  Comment:  ?    ? Cutoff Values for Urine Drug Screen, Pain Mgmt ?         Drug Class           Cutoff (ng/mL) ?         Amphetamines             500 ?         Barbiturates             200 ?         Cocaine Metabolites      150 ?         Benzodiazepines          200 ?         Methadone                300 ?         Opiates                  300 ?         Phencyclidine             25 ?         Propoxyphene             300 ?         Marijuana Metabolites     50 ?  ? For medical purposes only.  ?   ?  ?  Passed - K in normal range and within 180 days  ?  Potassium  ?Date Value Ref Range Status  ?03/10/2021 5.0 3.5 - 5.3 mmol/L Final  ?   ?  ?  Passed - Patient is not pregnant  ?  ?  Passed - Last BP in normal range  ?  BP Readings from Last 1 Encounters:  ?06/02/21 132/78  ?   ?  ?  Passed - Valid encounter within last 6 months  ?  Recent Outpatient Visits   ?      ? 3 months ago Encounter for general adult medical examination with abnormal findings  ? [facility] [place], Coralie Keens, NP  ? 11 months ago Anxiety state  ? [facility] [place], Coralie Keens, NP  ?  ?  ? ?  ?  ?  ? ? ?

## 2021-06-16 NOTE — Telephone Encounter (Signed)
Requested medication (s) are due for refill today - yes ? ?Requested medication (s) are on the active medication list -yes ? ?Future visit scheduled -no ? ?Last refill: 05/19/21 #90 ? ?Notes to clinic: Request RF: non delegated Rx ? ?Requested Prescriptions  ?Pending Prescriptions Disp Refills  ? ALPRAZolam (XANAX) 0.5 MG tablet [Pharmacy Med Name: ALPRAZOLAM 0.5MG  TABLETS] 90 tablet   ?  Sig: TAKE 1 TABLET BY MOUTH THREE TIMES DAILY  ?  ? Not Delegated - Psychiatry: Anxiolytics/Hypnotics 2 Failed - 06/16/2021 10:32 AM  ?  ?  Failed - This refill cannot be delegated  ?  ?  Failed - Urine Drug Screen completed in last 360 days  ?  ?  Passed - Patient is not pregnant  ?  ?  Passed - Valid encounter within last 6 months  ?  Recent Outpatient Visits   ? ?      ? 3 months ago Encounter for general adult medical examination with abnormal findings  ? [facility] [place], Coralie Keens, NP  ? 11 months ago Anxiety state  ? [facility] [place], Coralie Keens, NP  ? ?  ?  ? ? ?  ?  ?  ? ? ? ?Requested Prescriptions  ?Pending Prescriptions Disp Refills  ? ALPRAZolam (XANAX) 0.5 MG tablet [Pharmacy Med Name: ALPRAZOLAM 0.5MG  TABLETS] 90 tablet   ?  Sig: TAKE 1 TABLET BY MOUTH THREE TIMES DAILY  ?  ? Not Delegated - Psychiatry: Anxiolytics/Hypnotics 2 Failed - 06/16/2021 10:32 AM  ?  ?  Failed - This refill cannot be delegated  ?  ?  Failed - Urine Drug Screen completed in last 360 days  ?  ?  Passed - Patient is not pregnant  ?  ?  Passed - Valid encounter within last 6 months  ?  Recent Outpatient Visits   ? ?      ? 3 months ago Encounter for general adult medical examination with abnormal findings  ? [facility] [place], Coralie Keens, NP  ? 11 months ago Anxiety state  ? [facility] [place], Coralie Keens, NP  ? ?  ?  ? ? ?  ?  ?  ? ? ? ?

## 2021-07-03 ENCOUNTER — Ambulatory Visit: Payer: Medicare HMO | Admitting: Primary Care

## 2021-07-17 ENCOUNTER — Other Ambulatory Visit: Payer: Self-pay | Admitting: Internal Medicine

## 2021-07-17 NOTE — Telephone Encounter (Signed)
Requested medication (s) are due for refill today: yes  Requested medication (s) are on the active medication list: yes  Last refill:  06/17/21 #90/0  Future visit scheduled: no  Notes to clinic:  Unable to refill per protocol, cannot delegate.    Requested Prescriptions  Pending Prescriptions Disp Refills   ALPRAZolam (XANAX) 0.5 MG tablet [Pharmacy Med Name: ALPRAZOLAM 0.5MG  TABLETS] 90 tablet     Sig: TAKE 1 TABLET BY MOUTH THREE TIMES DAILY     Not Delegated - Psychiatry: Anxiolytics/Hypnotics 2 Failed - 07/17/2021  9:01 AM      Failed - This refill cannot be delegated      Failed - Urine Drug Screen completed in last 360 days      Passed - Patient is not pregnant      Passed - Valid encounter within last 6 months    Recent Outpatient Visits           4 months ago Encounter for general adult medical examination with abnormal findings   [facility] [place], Coralie Keens, NP   12 months ago Anxiety state   [facility] [place], Coralie Keens, [place]

## 2021-08-05 ENCOUNTER — Ambulatory Visit: Payer: Medicare HMO | Attending: Pulmonary Disease

## 2021-08-05 DIAGNOSIS — J449 Chronic obstructive pulmonary disease, unspecified: Secondary | ICD-10-CM | POA: Diagnosis not present

## 2021-08-05 MED ORDER — ALBUTEROL SULFATE (2.5 MG/3ML) 0.083% IN NEBU
2.5000 mg | INHALATION_SOLUTION | Freq: Once | RESPIRATORY_TRACT | Status: AC
  Administered 2021-08-05: 2.5 mg via RESPIRATORY_TRACT

## 2021-08-13 ENCOUNTER — Encounter: Payer: Self-pay | Admitting: Pulmonary Disease

## 2021-08-13 ENCOUNTER — Ambulatory Visit: Payer: Medicare HMO | Admitting: Pulmonary Disease

## 2021-08-13 VITALS — BP 122/74 | HR 74 | Temp 98.2°F | Ht 68.0 in | Wt 121.2 lb

## 2021-08-13 DIAGNOSIS — G4733 Obstructive sleep apnea (adult) (pediatric): Secondary | ICD-10-CM | POA: Diagnosis not present

## 2021-08-13 DIAGNOSIS — F17210 Nicotine dependence, cigarettes, uncomplicated: Secondary | ICD-10-CM | POA: Diagnosis not present

## 2021-08-13 DIAGNOSIS — J449 Chronic obstructive pulmonary disease, unspecified: Secondary | ICD-10-CM

## 2021-08-13 DIAGNOSIS — Z9989 Dependence on other enabling machines and devices: Secondary | ICD-10-CM

## 2021-08-13 MED ORDER — ROFLUMILAST 250 MCG PO TABS: 1.0000 | ORAL_TABLET | Freq: Every day | ORAL | 1 refills | Status: DC

## 2021-08-13 NOTE — Progress Notes (Signed)
Subjective:    Patient ID: Elijah Mcfarland, male    DOB: [birth year], 71 y.o.   MRN: [id number]  HPI Elijah Mcfarland is a 71 year old current smoker (8 to 10 cigarettes/day) who presents for follow-up on the issue of COPD Gold class III.  This is a scheduled visit.  Seen here on 02 June 2021 at that time he had a mild exacerbation and was treated with azithromycin and Medrol Dosepak.  He did well with that.  Today he presents stating that he is doing well with Breztri twice a day.  He has cut back on smoking and is down to 8 to 10 cigarettes a day.  He still has significant sputum production during the day whitish to gray.  This is a chronic issue.  He has not had any fevers, chills or sweats.  No hemoptysis.  Otherwise feels that he is quite at baseline.  He does not endorse any other symptomatology.  Dyspnea is about the same.  He is compliant with nocturnal oxygen and CPAP.   DATA 03/28/2019 echocardiogram: LVEF 55 to 60%, no regional wall motion abnormality, grade 1 DD, normal right ventricular systolic function.  No valvular abnormalities 03/29/2019 PFTs: FEV1 1.17 L or 40% predicted, FVC 2.68 L or 68% predicted, FEV1/FVC 44%, there is bronchodilator response with regards to air trapping.  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  FEV1 1.23 L or 43% predicted, FVC 2.90 L or 76% predicted, FEV1/FVC 42%, there is bronchodilator response with regards to air trapping.  There is air trapping without hyperinflation, small airways component.  Diffusion capacity severely reduced as prior.  Overall no significant change.  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  Patient Active Problem List   Diagnosis Date Noted   Aortic atherosclerosis (HCC)  03/10/2021   Insomnia 07/21/2020   Malnutrition (HCC) 07/21/2020   GERD (gastroesophageal reflux disease) 03/01/2018   Macular degeneration 08/22/2017   HTN (hypertension) 01/07/2014   Prediabetes 04/13/2013   HYPERLIPIDEMIA, MIXED 05/17/2008   Anxiety state 04/25/2006   Generalized convulsive epilepsy (HCC) 04/25/2006   Stage 3 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 (HCC) 04/25/2006   Arthritis 04/25/2006   Social History   Tobacco Use   Smoking status: Every Day    Packs/day: 3.00    Years: 51.00    Total pack years: 153.00    Types: Cigarettes   Smokeless tobacco: Never   Tobacco comments:    8-10 daily--08/13/2021  Substance Use Topics   Alcohol use: No    Alcohol/week: 0.0 standard drinks of alcohol   Allergies  Allergen Reactions   Nsaids Other (See Comments)    GI bleed and vomiting   Current Meds  Medication Sig   acetaminophen (TYLENOL) 325 MG tablet Take 2 tablets (650 mg total) by mouth every 6 (six) hours as needed for mild pain or headache (or Fever >/= 101).   albuterol (VENTOLIN HFA) 108 (90 Base) MCG/ACT inhaler INHALE 2 PUFFS INTO THE LUNGS EVERY 6 HOURS AS NEEDED FOR WHEEZING OR SHORTNESS OF BREATH   ALPRAZolam (XANAX) 0.5 MG tablet TAKE 1 TABLET BY MOUTH THREE TIMES DAILY   atorvastatin (LIPITOR) 20 MG tablet TAKE 1 TABLET EVERY DAY   Budeson-Glycopyrrol-Formoterol (BREZTRI AEROSPHERE) 160-9-4.8 MCG/ACT AERO Inhale 2 puffs into  the lungs in the morning and at bedtime.   citalopram (CELEXA) 40 MG tablet TAKE 1 TABLET EVERY DAY   eszopiclone (LUNESTA) 1 MG TABS tablet Take 1 tablet (1 mg total) by mouth at bedtime as needed for sleep. Take immediately before bedtime   loratadine (CLARITIN) 10 MG tablet Take 10 mg by mouth daily.   losartan (COZAAR) 50 MG tablet TAKE 1 TABLET(50 MG) BY MOUTH DAILY   Nutritional Supplements (NUTRITIONAL SUPPLEMENT PO) Take by mouth. Super Beta Prostate Advanced   OXYGEN Inhale into the lungs at bedtime.   phenytoin (DILANTIN)  100 MG ER capsule TAKE 3 CAPSULES EVERY DAY   [DISCONTINUED] methylPREDNISolone (MEDROL DOSEPAK) 4 MG TBPK tablet Take as directed in the package.  \ Immunization History  Administered Date(s) Administered   Fluad Quad(high Dose 65+) 11/17/2018, 02/05/2020, 03/10/2021   H1N1 01/05/2008   Influenza Whole 11/07/2007   Influenza, High Dose Seasonal PF 03/03/2018   Influenza, Seasonal, Injecte, Preservative Fre 04/13/2013   Influenza,inj,Quad PF,6+ Mos 11/14/2014, 02/19/2016, 02/18/2017   Moderna SARS-COV2 Booster Vaccination 12/26/2019   Moderna Sars-Covid-2 Vaccination 04/22/2019, 05/20/2019   Pneumococcal Conjugate-13 11/14/2014, 02/05/2020   Pneumococcal Polysaccharide-23 11/07/2007, 02/19/2016, 03/03/2018   Td 09/06/2008       Objective:   Physical Exam BP 122/74 (BP Location: Left Arm, Cuff Size: Normal)   Pulse 74   Temp 98.2 F (36.8 C) (Temporal)   Ht 5\' 8"  (1.727 m)   Wt 121 lb 3.2 oz (55 kg)   SpO2 97%   BMI 18.43 kg/m  GENERAL: Thin, well-developed male, no overt respiratory distress, fully ambulatory. HEAD: Normocephalic, atraumatic. EYES: Pupils equal, round, reactive to light.  No scleral icterus. MOUTH: Nose/mouth/throat not examined due to masking requirements for COVID 19. NECK: Supple. No thyromegaly. No nodules. No JVD.  Trachea midline PULMONARY: Symmetrical air entry, no accessory muscle use noted today, diffuse wheezes noted, no other adventitious sounds, breath sounds are coarse. CARDIOVASCULAR: S1 and S2. Regular rate and rhythm.  No overt murmurs, gallops or rubs noted. GASTROINTESTINAL: No distention.  Benign. MUSCULOSKELETAL: No joint deformity, no clubbing, no edema. NEUROLOGIC: No focal deficits, fluent speech, awake, alert. SKIN: Intact,warm,dry.  No rashes noted on limited exam. PSYCH: Normal mood and behavior   Ambulatory oximetry was performed today: Patient maintained oxygen saturations between 96 to 97% throughout ambulation.  Tolerated 3  laps total of 750 feet with only moderate shortness of breath.  Patient ambulated at a moderate pace.     Assessment & Plan:     ICD-10-CM   1. Stage 3 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 (HCC)  J44.9    Continue Breztri 2 puffs twice a day Continue as needed albuterol Daliresp 250 mcg 1 tablet daily    2. OSA on CPAP  G47.33    Z99.89    Continue auto CPAP Continue oxygen bled into CPAP Patient compliant    3. Tobacco dependence due to cigarettes  F17.210    Patient currently trying to quit Is decreasing use of cigarettes significantly     Meds ordered this encounter  Medications   Roflumilast (DALIRESP) 250 MCG TABS    Sig: Take 1 tablet by mouth daily.    Dispense:  30 tablet    Refill:  1   Will see the patient in follow-up in 3 months time he is to contact us prior to that time should any new difficulties arise.  Gailen Shelter, MD Advanced Bronchoscopy PCCM [facility]    *This note was  dictated using voice recognition software/Dragon.  Despite best efforts to proofread, errors can occur which can change the meaning. Any transcriptional errors that result from this process are unintentional and may not be fully corrected at the time of dictation.

## 2021-08-13 NOTE — Patient Instructions (Signed)
We are starting tablet called DALIRESP (Roflumilast) this is 1 tablet daily.  If you develop any issues with abdominal cramping take 1 tablet every other day until this gets better then resume 1 tablet daily.  This tablet should help with your issues with the cough and phlegm production.  We will see you in follow-up in 3 months time call sooner should any new problems arise.

## 2021-08-14 ENCOUNTER — Other Ambulatory Visit: Payer: Self-pay | Admitting: Internal Medicine

## 2021-08-14 NOTE — Telephone Encounter (Signed)
Requested medication (s) are due for refill today: yes  Requested medication (s) are on the active medication list: yes  Last refill:  07/17/21 #90 with 0 RF  Future visit scheduled: 09/14/21, seen 03/10/21  Notes to clinic:  This medication can not be delegated, please assess.        Requested Prescriptions  Pending Prescriptions Disp Refills   ALPRAZolam (XANAX) 0.5 MG tablet [Pharmacy Med Name: ALPRAZOLAM 0.5MG  TABLETS] 90 tablet     Sig: TAKE 1 TABLET BY MOUTH THREE TIMES DAILY     Not Delegated - Psychiatry: Anxiolytics/Hypnotics 2 Failed - 08/14/2021  8:57 AM      Failed - This refill cannot be delegated      Failed - Urine Drug Screen completed in last 360 days      Passed - Patient is not pregnant      Passed - Valid encounter within last 6 months    Recent Outpatient Visits           5 months ago Encounter for general adult medical examination with abnormal findings   [facility] [place], Coralie Keens, NP   1 year ago Anxiety state   [facility] [place], Coralie Keens, NP       Future Appointments             In 1 month [place], Coralie Keens, NP [facility]

## 2021-08-31 ENCOUNTER — Ambulatory Visit (INDEPENDENT_AMBULATORY_CARE_PROVIDER_SITE_OTHER): Payer: Medicare HMO | Admitting: Pharmacist

## 2021-08-31 DIAGNOSIS — J449 Chronic obstructive pulmonary disease, unspecified: Secondary | ICD-10-CM

## 2021-09-01 NOTE — Chronic Care Management (AMB) (Signed)
Chronic Care Management CCM Pharmacy Note  08/31/2021 Name:  Elijah Mcfarland MRN:  [id number] DOB:  April 16, 1950    Subjective: Elijah Mcfarland is an 71 y.o. year old male who is a primary patient of [name], NP.  The CCM team was consulted for assistance with disease management and care coordination needs.    Engaged with patient by telephone for follow up visit for pharmacy case management and/or care coordination services.   Objective:  Medications Reviewed Today     Reviewed by Rennis Petty, RPH-CPP (Pharmacist) on 09/01/21 at [place] List Status: <None>   Medication Order Taking? Sig Documenting Provider Last Dose Status Informant  acetaminophen (TYLENOL) 325 MG tablet [id number]  Take 2 tablets (650 mg total) by mouth every 6 (six) hours as needed for mild pain or headache (or Fever >/= 101). Roxan Hockey, MD  Active Self  albuterol (VENTOLIN HFA) 108 (90 Base) MCG/ACT inhaler [id number]  INHALE 2 PUFFS INTO THE LUNGS EVERY 6 HOURS AS NEEDED FOR WHEEZING OR SHORTNESS OF [place] Blas, MD  Active   ALPRAZolam [name]) 0.5 MG tablet [id number]  TAKE 1 TABLET BY MOUTH THREE TIMES DAILY. [name], NP  Active   atorvastatin (LIPITOR) 20 MG tablet [id number]  TAKE 1 TABLET EVERY DAY Baity, Coralie Keens, NP  Active   Budeson-Glycopyrrol-Formoterol (BREZTRI AEROSPHERE) 160-9-4.8 MCG/ACT Hollie Salk [id number]  Inhale 2 puffs into the lungs in the morning and at bedtime. Tyler Pita, MD  Active   citalopram (CELEXA) 40 MG tablet [id number]  TAKE 1 TABLET EVERY DAY Baity, Coralie Keens, NP  Active   loratadine (CLARITIN) 10 MG tablet [id number]  Take 10 mg by mouth daily. [provider]  Active   losartan (COZAAR) 50 MG tablet [id number]  TAKE 1 TABLET(50 MG) BY MOUTH DAILY Garnette Gunner Coralie Keens, NP  Active   Nutritional Supplements (NUTRITIONAL SUPPLEMENT PO) [id number]  Take by mouth. Super Beta Prostate Advanced [provider]  Active   OXYGEN [id number]   Inhale into the lungs at bedtime. [provider]  Active Self  phenytoin (DILANTIN) 100 MG ER capsule [id number]  TAKE 3 CAPSULES EVERY DAY Baity, Coralie Keens, NP  Active   Roflumilast (DALIRESP) 250 MCG TABS [id number]  Take 1 tablet by mouth daily. Tyler Pita, MD  Active   Med List Note Lurlean Nanny, [place] 08/07/19 0175): UDS/CSA 07/06/19 repeat 10/06/2019            Pertinent Labs:   Lab Results  Component Value Date   CREATININE 0.84 03/10/2021   BUN 9 03/10/2021   NA 140 03/10/2021   K 5.0 03/10/2021   CL 103 03/10/2021   CO2 33 (H) 03/10/2021    SDOH:  (Social Determinants of Health) assessments and interventions performed:    Catherine  Review of patient past medical history, allergies, medications, health status, including review of consultants reports, laboratory and other test data, was performed as part of comprehensive evaluation and provision of chronic care management services.   Care Plan : PharmD - Medication Assistance  Updates made by Rennis Petty, RPH-CPP since 09/01/2021 12:00 AM     Problem: Disease Progression      Long-Range Goal: Disease Progression Prevented or Minimized   Start Date: 08/08/2020  Expected End Date: 11/06/2020  Recent Progress: On track  Priority: High  Note:   Current Barriers:  Unable to independently afford treatment regimen Reports cost of Trelegy inhaler unaffordable as currently  in coverage gap of [employer] Medicare Part D plan Patient APPROVED for [name] patient assistance from [employer] 09/04/20-02/14/22 Patient is legally blind  Pharmacist Clinical Goal(s):  Over the next 90 days, patient will verbalize ability to afford treatment regimen through collaboration with PharmD and provider.   Interventions: 1:1 collaboration with [name], NP regarding development and update of comprehensive plan of care as evidenced by provider attestation and co-signature Inter-disciplinary care team  collaboration (see longitudinal plan of care) Perform chart review. Patient seen for Office Visit with [place] on 6/29. Provider advised patient to start Daliresp, Rx sent for Daliresp 250 mcg daily Today reports his sleep has improved with having a fixed sleep schedule, consistently going to bed between 9:30 -10 pm has helped him with sleeping through the night  COPD: Patient followed by [facility] [place] Current treatment: Breztri - 2 puffs twice daily Albuterol as needed Confirms using Breztri inhaler twice daily (morning and evening) and rinsing mouth out after use Reports has not yet started Daliresp 250 mcg daily, as prescribed by Pulmonologist due to the cost, but is planning to pick up and start it next week Note [employer] patient assistance program is no longer accepting applications for Daliresp assistance. Note significant drug-drug interaction exists between Brea and patient's phenytoin - package labeling recommends avoiding this combination as strong CYP3A4 inducers, such  as phenytoin, may significantly decrease serum concentrations of both Daliresp and its active metabolite Note patient taking phenytoin for seizure prevention Will send message to patient's pulmonologist regarding this potential drug-drug interaction  Tobacco Use: Reports has reduced smoking to ~1/2 pack of cigarettes/day Patient not ready to set quit date today, but reports thinking about quitting Reports currently using nicotine patches and nicotine gum  Counsel patient on nicotine therapy options, dosing and administration Advise patient that nicotine replacement products available through health plan over the counter benefit Patient not ready to set a quit date today, but rather continuing to work on cutting back further   Patient Goals/Self-Care Activities Over the next 90 days, patient will:  - collaborate with provider on medication access solutions - attend medical  appointment as scheduled  Follow Up Plan: Telephone follow up appointment with care management team member scheduled for: 08/31/2021 at 1:00 PM      Wallace Cullens, PharmD, [place] [phone]

## 2021-09-01 NOTE — Patient Instructions (Signed)
Visit Information  Thank you for taking time to visit with me today. Please don't hesitate to contact me if I can be of assistance to you before our next scheduled telephone appointment.  Following are the goals we discussed today:   Goals Addressed             This Visit's Progress    Pharmacy Goals       Please use your Breztri 2 puffs twice a day.  Make sure you rinse your mouth well after you use it.  Please consider calling the [place] Quitline for their help with quitting smoking.  The [place] Quitline phone number is: [phone]  Feel free to call me with any questions or concerns. I look forward to our next call!   Wallace Cullens, PharmD, [place] [phone]         Our next appointment is by telephone on 01/13/2022 at 1 pm  Please call the care guide team at [phone] if you need to cancel or reschedule your appointment.    The patient verbalized understanding of instructions, educational materials, and care plan provided today and DECLINED offer to receive copy of patient instructions, educational materials, and care plan.

## 2021-09-04 ENCOUNTER — Telehealth: Payer: Self-pay | Admitting: Pulmonary Disease

## 2021-09-04 NOTE — Telephone Encounter (Signed)
Spoke to [place], Software engineer with [employer] center. She stated that Daliresp interacts with Phenytoin.  Tammy, please advise. Dr. Patsey Berthold is unavailable.

## 2021-09-04 NOTE — Telephone Encounter (Signed)
Lm for Elijah Mcfarland to ask if interaction will decrease seizure threshold.

## 2021-09-04 NOTE — Telephone Encounter (Signed)
Spoke to [place] with [employer]. She confirmed that interaction will not decrease seizure threshold but it does decrease daliresp effectiveness.  Per TP verbally-Most likely will be fine to take Daliresp since it does not decrease seizure threshold. Will hold off on starting Daliresp and defer to Dr. Patsey Berthold to address on 09/07/2021.  Benjamine Mola is aware of above message and voiced her understanding.   Spoke to patient and relayed above message/recommendations. He voiced his understanding.   Routing to Dr. Patsey Berthold.

## 2021-09-07 NOTE — Telephone Encounter (Signed)
Per Dr. Nestor Ramp to proceed with Daliresp.  Lm for Benjamine Mola with [employer] center.

## 2021-09-07 NOTE — Telephone Encounter (Signed)
Elijah Mcfarland with [employer] is aware of below message and voiced her understanding.  Patient is also aware and voiced his understanding.  Nothing further needed.

## 2021-09-07 NOTE — Telephone Encounter (Signed)
Agreed -

## 2021-09-08 ENCOUNTER — Telehealth: Payer: Self-pay

## 2021-09-08 ENCOUNTER — Other Ambulatory Visit (HOSPITAL_COMMUNITY): Payer: Self-pay

## 2021-09-08 ENCOUNTER — Ambulatory Visit: Payer: Medicare HMO | Admitting: Internal Medicine

## 2021-09-08 NOTE — Telephone Encounter (Signed)
Error. Prior auth not required.

## 2021-09-14 ENCOUNTER — Other Ambulatory Visit: Payer: Self-pay | Admitting: Internal Medicine

## 2021-09-14 ENCOUNTER — Ambulatory Visit (INDEPENDENT_AMBULATORY_CARE_PROVIDER_SITE_OTHER): Payer: Medicare HMO | Admitting: Internal Medicine

## 2021-09-14 ENCOUNTER — Encounter: Payer: Self-pay | Admitting: Internal Medicine

## 2021-09-14 VITALS — BP 106/64 | HR 75 | Temp 97.7°F | Wt 119.0 lb

## 2021-09-14 DIAGNOSIS — H3530 Unspecified macular degeneration: Secondary | ICD-10-CM

## 2021-09-14 DIAGNOSIS — J449 Chronic obstructive pulmonary disease, unspecified: Secondary | ICD-10-CM

## 2021-09-14 DIAGNOSIS — M1990 Unspecified osteoarthritis, unspecified site: Secondary | ICD-10-CM | POA: Diagnosis not present

## 2021-09-14 DIAGNOSIS — R3911 Hesitancy of micturition: Secondary | ICD-10-CM

## 2021-09-14 DIAGNOSIS — E782 Mixed hyperlipidemia: Secondary | ICD-10-CM | POA: Diagnosis not present

## 2021-09-14 DIAGNOSIS — K2101 Gastro-esophageal reflux disease with esophagitis, with bleeding: Secondary | ICD-10-CM | POA: Diagnosis not present

## 2021-09-14 DIAGNOSIS — I700 Atherosclerosis of aorta: Secondary | ICD-10-CM

## 2021-09-14 DIAGNOSIS — N401 Enlarged prostate with lower urinary tract symptoms: Secondary | ICD-10-CM

## 2021-09-14 DIAGNOSIS — I10 Essential (primary) hypertension: Secondary | ICD-10-CM

## 2021-09-14 DIAGNOSIS — E440 Moderate protein-calorie malnutrition: Secondary | ICD-10-CM

## 2021-09-14 DIAGNOSIS — R7303 Prediabetes: Secondary | ICD-10-CM

## 2021-09-14 DIAGNOSIS — N400 Enlarged prostate without lower urinary tract symptoms: Secondary | ICD-10-CM | POA: Insufficient documentation

## 2021-09-14 DIAGNOSIS — G40309 Generalized idiopathic epilepsy and epileptic syndromes, not intractable, without status epilepticus: Secondary | ICD-10-CM | POA: Diagnosis not present

## 2021-09-14 DIAGNOSIS — F411 Generalized anxiety disorder: Secondary | ICD-10-CM

## 2021-09-14 MED ORDER — ALPRAZOLAM 0.5 MG PO TABS: 0.5000 mg | ORAL_TABLET | Freq: Three times a day (TID) | ORAL | 0 refills | Status: DC

## 2021-09-14 MED ORDER — TAMSULOSIN HCL 0.4 MG PO CAPS: 0.4000 mg | ORAL_CAPSULE | Freq: Every day | ORAL | 1 refills | Status: DC

## 2021-09-14 MED ORDER — LOSARTAN POTASSIUM 50 MG PO TABS: 25.0000 mg | ORAL_TABLET | Freq: Every day | ORAL | 1 refills | Status: DC

## 2021-09-14 NOTE — Assessment & Plan Note (Signed)
CSA today Xanax refilled Continue escitalopram and buspirone

## 2021-09-14 NOTE — Patient Instructions (Signed)

## 2021-09-14 NOTE — Progress Notes (Signed)
Subjective:    Patient ID: Elijah Mcfarland, male    DOB: [birth year], 71 y.o.   MRN: [id number]  HPI  Patient presents to clinic today for 51-month follow-up of chronic conditions.  Macular Degeneration: He is legally blind.  He no longer follows with ophthalmology.  Anxiety: Chronic, managed on Escitalopram, Buspirone and Xanax.  He is not currently seeing a therapist.  He denies depression, SI/HI.  OA: Mainly in his shoulder, back and knees.  He takes Tylenol as needed with good relief of symptoms.  He follows with orthopedics.  COPD: He denies chronic cough but does report shortness of breath.  He is taking Breztri and Albuterol as prescribed.  He does wear oxygen at night.  PFTs from 03/2013 reviewed.  He follows with pulmonology.  He does continue to smoke.  Seizure Disorder: He denies recent seizures on Dilantin.  He no longer follows with neurology.  HTN: His BP today is 106/64.  He is taking Losartan as prescribed.  ECG from 02/2018 reviewed.  Prediabetes: His last A1c was 5.3%, 02/2021.  He is not taking any oral diabetic medication at this time.  He does not check his sugars.  GERD: With history of GI bleed.  He is no longer taking Pantoprazole.  Upper GI from 02/2018 reviewed.  He does not follow with GI.  HLD with Aortic Atherosclerosis: His last LDL was 103, triglycerides 124, 02/2021.  He denies myalgias on Atorvastatin.  He tries to consume low-fat diet.  BPH: He reports mainly urinary urgency, hesitancy and incontinence. He tried Super Beta but reports it seemed to make it worse. He does not follow with urology.  Review of Systems   Past Medical History:  Diagnosis Date   Chronic pain syndrome    Left knee   COPD (chronic obstructive pulmonary disease) (HCC)    DDD (degenerative disc disease), lumbar 11/2008   L5-S1 on plain film   Emphysema lung (HCC)    GAD (generalized anxiety disorder)    GERD (gastroesophageal reflux disease)    History of multiple pulmonary  nodules 07/2010   Follow up CT scans showed resolution by 03/2011--NO MALIGNANCY.   History of pneumonia 06/2010   HTN (hypertension) 01/07/2014   Osteoarthritis of left knee    + past injury of this knee--tibia fracture?  +left leg shorter than right   Seizure (HCC)    Seizure disorder (HCC)    3 total seizures (first was 1995); neuro eval done and recommended lifetime phenytoin (he had a seizure after coming off the med in the late 90s   Tobacco dependence     Current Outpatient Medications  Medication Sig Dispense Refill   acetaminophen (TYLENOL) 325 MG tablet Take 2 tablets (650 mg total) by mouth every 6 (six) hours as needed for mild pain or headache (or Fever >/= 101). 30 tablet 2   albuterol (VENTOLIN HFA) 108 (90 Base) MCG/ACT inhaler INHALE 2 PUFFS INTO THE LUNGS EVERY 6 HOURS AS NEEDED FOR WHEEZING OR SHORTNESS OF BREATH 20.1 g 0   ALPRAZolam (XANAX) 0.5 MG tablet TAKE 1 TABLET BY MOUTH THREE TIMES DAILY. 90 tablet 0   atorvastatin (LIPITOR) 20 MG tablet TAKE 1 TABLET EVERY DAY 90 tablet 0   Budeson-Glycopyrrol-Formoterol (BREZTRI AEROSPHERE) 160-9-4.8 MCG/ACT AERO Inhale 2 puffs into the lungs in the morning and at bedtime. 32.1 g 3   citalopram (CELEXA) 40 MG tablet TAKE 1 TABLET EVERY DAY 90 tablet 0   loratadine (CLARITIN) 10 MG tablet Take 10 mg  by mouth daily.     losartan (COZAAR) 50 MG tablet TAKE 1 TABLET(50 MG) BY MOUTH DAILY 90 tablet 0   Nutritional Supplements (NUTRITIONAL SUPPLEMENT PO) Take by mouth. Super Beta Prostate Advanced     OXYGEN Inhale into the lungs at bedtime.     phenytoin (DILANTIN) 100 MG ER capsule TAKE 3 CAPSULES EVERY DAY 270 capsule 0   Roflumilast (DALIRESP) 250 MCG TABS Take 1 tablet by mouth daily. 30 tablet 1   No current facility-administered medications for this visit.    Allergies  Allergen Reactions   Nsaids Other (See Comments)    GI bleed and vomiting    Family History  Problem Relation Age of Onset   Heart disease Mother     Heart disease Father    Cancer Sister    Stroke Neg Hx     Social History   Socioeconomic History   Marital status: Married    Spouse name: Not on file   Number of children: Not on file   Years of education: Not on file   Highest education level: Not on file  Occupational History   Not on file  Tobacco Use   Smoking status: Every Day    Packs/day: 3.00    Years: 51.00    Total pack years: 153.00    Types: Cigarettes   Smokeless tobacco: Never   Tobacco comments:    8-10 daily--08/13/2021  Vaping Use   Vaping Use: Never used  Substance and Sexual Activity   Alcohol use: No    Alcohol/week: 0.0 standard drinks of alcohol   Drug use: Not Currently    Types: Marijuana    Comment: 2 month   Sexual activity: Yes    Partners: Male  Other Topics Concern   Not on file  Social History Narrative   Married, 2 grown children   Eighth grade education.  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   Originally from [place].   Tobacco 100 pack-yr hx, still smoking as of 10/2011 (1 pack per day).   Denies alcohol or drug use.   No exercise.   Social Determinants of Health   Financial Resource Strain: Not on file  Food Insecurity: Not on file  Transportation Needs: Not on file  Physical Activity: Not on file  Stress: Not on file  Social Connections: Not on file  Intimate Partner Violence: Not on file     Constitutional: Denies fever, malaise, fatigue, headache or abrupt weight changes.  HEENT: Patient reports difficulty with vision.  Denies eye pain, eye redness, ear pain, ringing in the ears, wax buildup, runny nose, nasal congestion, bloody nose, or sore throat. Respiratory: Patient reports shortness of breath.  Denies difficulty breathing, cough or sputum production.   Cardiovascular: Denies chest pain, chest tightness, palpitations or swelling in the hands or feet.  Gastrointestinal: Denies abdominal pain, bloating, constipation, diarrhea or blood in the  stool.  GU: Patient reports urinary urgency, hesitancy and incontinence.  Denies frequency, pain with urination, burning sensation, blood in urine, odor or discharge. Musculoskeletal: Patient reports joint pain.  Denies decrease in range of motion, difficulty with gait, muscle pain or joint swelling.  Skin: Denies redness, rashes, lesions or ulcercations.  Neurological: Denies dizziness, difficulty with memory, difficulty with speech or problems with balance and coordination.  Psych: Patient has a history of anxiety.  Denies depression, SI/HI.  No other specific complaints in a complete review of systems (except as listed in HPI above).  Objective:   Physical Exam  BP 106/64 (BP Location: Left Arm, Patient Position: Sitting, Cuff Size: Normal)   Pulse 75   Temp 97.7 F (36.5 C) (Temporal)   Wt 119 lb (54 kg)   SpO2 100%   BMI 18.09 kg/m   Wt Readings from Last 3 Encounters:  08/13/21 121 lb 3.2 oz (55 kg)  06/02/21 125 lb 12.8 oz (57.1 kg)  03/10/21 130 lb 3.2 oz (59.1 kg)    General: Appears is stated age, underweight, in NAD. Skin: Warm, dry and intact.  HEENT: Head: normal shape and size; Eyes: sclera white, no icterus, conjunctiva pink, PERRLA and EOMs intact;  Cardiovascular: Normal rate and rhythm. S1,S2 noted.  No murmur, rubs or gallops noted. No JVD or BLE edema. No carotid bruits noted. Pulmonary/Chest: Normal effort and positive vesicular breath sounds. No respiratory distress. No wheezes, rales or ronchi noted.  Abdomen: Soft and nontender. Normal bowel sounds.  Musculoskeletal: Strength 5/5 BUE/BLE.  Muscle wasting noted.  No difficulty with gait.  Neurological: Alert and oriented.  Psychiatric: Mood and affect normal. Behavior is normal. Judgment and thought content normal.    BMET    Component Value Date/Time   NA 140 03/10/2021 1346   K 5.0 03/10/2021 1346   CL 103 03/10/2021 1346   CO2 33 (H) 03/10/2021 1346   GLUCOSE 87 03/10/2021 1346   BUN 9  03/10/2021 1346   CREATININE 0.84 03/10/2021 1346   CALCIUM 9.6 03/10/2021 1346   GFRNONAA >60 03/10/2018 0506   GFRAA >60 03/10/2018 0506    Lipid Panel     Component Value Date/Time   CHOL 192 03/10/2021 1346   TRIG 124 03/10/2021 1346   HDL 66 03/10/2021 1346   CHOLHDL 2.9 03/10/2021 1346   VLDL 26.0 02/05/2020 1252   LDLCALC 103 (H) 03/10/2021 1346    CBC    Component Value Date/Time   WBC 8.5 03/10/2021 1346   RBC 4.40 03/10/2021 1346   HGB 13.6 03/10/2021 1346   HCT 40.8 03/10/2021 1346   PLT 222 03/10/2021 1346   MCV 92.7 03/10/2021 1346   MCH 30.9 03/10/2021 1346   MCHC 33.3 03/10/2021 1346   RDW 13.2 03/10/2021 1346   LYMPHSABS 1.0 03/06/2018 1235   MONOABS 1.0 03/06/2018 1235   EOSABS 0.3 03/06/2018 1235   BASOSABS 0.0 03/06/2018 1235    Hgb A1C Lab Results  Component Value Date   HGBA1C 5.3 03/10/2021           Assessment & Plan:     Webb Silversmith, NP RTC in 6 months for your annual exam

## 2021-09-14 NOTE — Assessment & Plan Note (Signed)
Continue Dilantin He declines Dilantin level today

## 2021-09-14 NOTE — Assessment & Plan Note (Signed)
Did not tolerate mirtazapine

## 2021-09-14 NOTE — Assessment & Plan Note (Signed)
C-Met and lipid profile today Encouraged him to consume a low-fat diet Continue atorvastatin Unable to take aspirin due to history of GI bleed 

## 2021-09-14 NOTE — Assessment & Plan Note (Signed)
Legally blind

## 2021-09-14 NOTE — Assessment & Plan Note (Signed)
A1c today.  

## 2021-09-14 NOTE — Addendum Note (Signed)
Addended by: [name] on: 09/14/2021 01:36 PM   Modules accepted: Orders

## 2021-09-14 NOTE — Assessment & Plan Note (Signed)
Continue Breztri and albuterol Encourage smoking cessation He will continue to wear oxygen at night He will continue to follow with pulmonology

## 2021-09-14 NOTE — Assessment & Plan Note (Signed)
We will trial Flomax

## 2021-09-14 NOTE — Assessment & Plan Note (Signed)
Currently not an issue off medication

## 2021-09-14 NOTE — Assessment & Plan Note (Signed)
Continue Tylenol as needed

## 2021-09-14 NOTE — Assessment & Plan Note (Signed)
C-Met and lipid profile today  encouraged him to consume a low-fat diet  continue atorvastatin

## 2021-09-14 NOTE — Assessment & Plan Note (Signed)
Low Decrease losartan to 25 mg daily C-Met today

## 2021-09-15 LAB — COMPLETE METABOLIC PANEL WITH GFR
AG Ratio: 1.9 (calc) (ref 1.0–2.5)
ALT: 13 U/L (ref 9–46)
AST: 17 U/L (ref 10–35)
Albumin: 4 g/dL (ref 3.6–5.1)
Alkaline phosphatase (APISO): 69 U/L (ref 35–144)
BUN: 8 mg/dL (ref 7–25)
CO2: 32 mmol/L (ref 20–32)
Calcium: 8.7 mg/dL (ref 8.6–10.3)
Chloride: 105 mmol/L (ref 98–110)
Creat: 0.75 mg/dL (ref 0.70–1.35)
Globulin: 2.1 g/dL (calc) (ref 1.9–3.7)
Glucose, Bld: 104 mg/dL — ABNORMAL HIGH (ref 65–99)
Potassium: 4.9 mmol/L (ref 3.5–5.3)
Sodium: 140 mmol/L (ref 135–146)
Total Bilirubin: 0.3 mg/dL (ref 0.2–1.2)
Total Protein: 6.1 g/dL (ref 6.1–8.1)
eGFR: 98 mL/min/{1.73_m2} (ref >=60)

## 2021-09-15 LAB — HEMOGLOBIN A1C
Hgb A1c MFr Bld: 5.1 % of total Hgb (ref <5.7)
Mean Plasma Glucose: 100 mg/dL
eAG (mmol/L): 5.5 mmol/L

## 2021-09-15 LAB — CBC
HCT: 36.1 % — ABNORMAL LOW (ref 38.5–50.0)
Hemoglobin: 12.1 g/dL — ABNORMAL LOW (ref 13.2–17.1)
MCH: 30.9 pg (ref 27.0–33.0)
MCHC: 33.5 g/dL (ref 32.0–36.0)
MCV: 92.1 fL (ref 80.0–100.0)
MPV: 10.5 fL (ref 7.5–12.5)
Platelets: 191 10*3/uL (ref 140–400)
RBC: 3.92 10*6/uL — ABNORMAL LOW (ref 4.20–5.80)
RDW: 13 % (ref 11.0–15.0)
WBC: 6.9 10*3/uL (ref 3.8–10.8)

## 2021-09-15 LAB — LIPID PANEL
Cholesterol: 148 mg/dL (ref <200)
HDL: 55 mg/dL (ref >=40)
LDL Cholesterol (Calc): 72 mg/dL (calc)
Non-HDL Cholesterol (Calc): 93 mg/dL (calc) (ref <130)
Total CHOL/HDL Ratio: 2.7 (calc) (ref <5.0)
Triglycerides: 130 mg/dL (ref <150)

## 2021-09-15 NOTE — Telephone Encounter (Signed)
Requested medication (s) are due for refill today - no  Requested medication (s) are on the active medication list -yes  Future visit scheduled -no  Last refill: 09/14/21 #45 1RF  Notes to clinic: Rx marked as no print- may need to be corrected and resent. Rx sig different  Requested Prescriptions  Pending Prescriptions Disp Refills   losartan (COZAAR) 50 MG tablet [Pharmacy Med Name: LOSARTAN 50MG  TABLETS] 90 tablet 0    Sig: TAKE 1 TABLET(50 MG) BY MOUTH DAILY     Cardiovascular:  Angiotensin Receptor Blockers Failed - 09/14/2021  3:31 AM      Failed - Cr in normal range and within 180 days    Creat  Date Value Ref Range Status  09/14/2021 0.75 0.70 - 1.35 mg/dL Final   Creatinine,U  Date Value Ref Range Status  11/03/2012 46.42 mg/dL Final    Comment:       Cutoff Values for Urine Drug Screen, Pain Mgmt          Drug Class           Cutoff (ng/mL)          Amphetamines             500          Barbiturates             200          Cocaine Metabolites      150          Benzodiazepines          200          Methadone                300          Opiates                  300          Phencyclidine             25          Propoxyphene             300          Marijuana Metabolites     50    For medical purposes only.         Failed - K in normal range and within 180 days    Potassium  Date Value Ref Range Status  09/14/2021 4.9 3.5 - 5.3 mmol/L Final         Failed - Valid encounter within last 6 months    Recent Outpatient Visits           Yesterday Aortic atherosclerosis [facility])   [facility] [place], Coralie Keens, NP   6 months ago Encounter for general adult medical examination with abnormal findings   [facility], Coralie Keens, NP   1 year ago Anxiety state   [place], [place]              Passed - Patient is not pregnant      Passed - Last BP in normal range    BP Readings from Last 1  Encounters:  09/14/21 106/64            Requested Prescriptions  Pending Prescriptions Disp Refills   losartan (COZAAR) 50 MG tablet [Pharmacy Med Name: LOSARTAN 50MG  TABLETS] 90 tablet 0    Sig:  TAKE 1 TABLET(50 MG) BY MOUTH DAILY     Cardiovascular:  Angiotensin Receptor Blockers Failed - 09/14/2021  3:31 AM      Failed - Cr in normal range and within 180 days    Creat  Date Value Ref Range Status  09/14/2021 0.75 0.70 - 1.35 mg/dL Final   Creatinine,U  Date Value Ref Range Status  11/03/2012 46.42 mg/dL Final    Comment:       Cutoff Values for Urine Drug Screen, Pain Mgmt          Drug Class           Cutoff (ng/mL)          Amphetamines             500          Barbiturates             200          Cocaine Metabolites      150          Benzodiazepines          200          Methadone                300          Opiates                  300          Phencyclidine             25          Propoxyphene             300          Marijuana Metabolites     50    For medical purposes only.         Failed - K in normal range and within 180 days    Potassium  Date Value Ref Range Status  09/14/2021 4.9 3.5 - 5.3 mmol/L Final         Failed - Valid encounter within last 6 months    Recent Outpatient Visits           Yesterday Aortic atherosclerosis [facility])   [facility], Coralie Keens, NP   6 months ago Encounter for general adult medical examination with abnormal findings   [facility] [place], Coralie Keens, NP   1 year ago Anxiety state   [place], [place]              Passed - Patient is not pregnant      Passed - Last BP in normal range    BP Readings from Last 1 Encounters:  09/14/21 106/64

## 2021-10-13 ENCOUNTER — Other Ambulatory Visit (HOSPITAL_COMMUNITY): Payer: Self-pay

## 2021-10-15 ENCOUNTER — Other Ambulatory Visit (HOSPITAL_COMMUNITY): Payer: Self-pay

## 2021-10-15 ENCOUNTER — Other Ambulatory Visit: Payer: Self-pay | Admitting: Internal Medicine

## 2021-10-15 ENCOUNTER — Telehealth: Payer: Self-pay

## 2021-10-15 NOTE — Telephone Encounter (Signed)
Patient Advocate Encounter   Received notification from [facility] that prior authorization is required for Roflumilast 289mcg  250MG  qd is starting dose only for Daliresp. After 28 days this medication should be used at 500MG  qd dose. PA team will need documented notes as to why the patient is continuing start dose beyond 28 days in order to submit this PA request.  PA not yet submitted.

## 2021-10-15 NOTE — Telephone Encounter (Signed)
Dr. Patsey Berthold, please see below message and advise. Thanks

## 2021-10-16 ENCOUNTER — Other Ambulatory Visit (HOSPITAL_COMMUNITY): Payer: Self-pay

## 2021-10-16 MED ORDER — ROFLUMILAST 500 MCG PO TABS: 500.0000 ug | ORAL_TABLET | Freq: Every day | ORAL | 6 refills | Status: DC

## 2021-10-16 NOTE — Addendum Note (Signed)
Addended by: Claudette Head A on: 10/16/2021 11:59 AM   Modules accepted: Orders

## 2021-10-16 NOTE — Telephone Encounter (Signed)
Patient is aware of below message/recommendations and voiced his understanding.  Daliresp 546mcg sent to preferred pharmacy.  He will call back if he develops any GI sx. Nothing further needed.

## 2021-10-16 NOTE — Telephone Encounter (Signed)
Requested medication (s) are due for refill today- yes  Requested medication (s) are on the active medication list -yes  Future visit scheduled -no  Last refill: 09/14/21 #90  Notes to clinic: non delegated Rx  Requested Prescriptions  Pending Prescriptions Disp Refills   ALPRAZolam (XANAX) 0.5 MG tablet [Pharmacy Med Name: ALPRAZOLAM 0.5MG  TABLETS] 90 tablet     Sig: TAKE 1 TABLET BY MOUTH THREE TIMES DAILY     Not Delegated - Psychiatry: Anxiolytics/Hypnotics 2 Failed - 10/15/2021  9:09 AM      Failed - This refill cannot be delegated      Failed - Urine Drug Screen completed in last 360 days      Passed - Patient is not pregnant      Passed - Valid encounter within last 6 months    Recent Outpatient Visits           1 month ago Aortic atherosclerosis ([place])   [facility], Coralie Keens, NP   7 months ago Encounter for general adult medical examination with abnormal findings   [facility], Coralie Keens, NP   1 year ago Anxiety state   [facility] [place], Coralie Keens, NP                 Requested Prescriptions  Pending Prescriptions Disp Refills   ALPRAZolam [name]) 0.5 MG tablet [Pharmacy Med Name: ALPRAZOLAM 0.5MG  TABLETS] 90 tablet     Sig: TAKE 1 TABLET BY MOUTH THREE TIMES DAILY     Not Delegated - Psychiatry: Anxiolytics/Hypnotics 2 Failed - 10/15/2021  9:09 AM      Failed - This refill cannot be delegated      Failed - Urine Drug Screen completed in last 360 days      Passed - Patient is not pregnant      Passed - Valid encounter within last 6 months    Recent Outpatient Visits           1 month ago Aortic atherosclerosis ([place])   [facility] [place], Coralie Keens, NP   7 months ago Encounter for general adult medical examination with abnormal findings   [facility] [place], Coralie Keens, NP   1 year ago Anxiety state   [facility] [place], Coralie Keens, [place]

## 2021-10-16 NOTE — Telephone Encounter (Signed)
The patient does have a tendency towards GI disturbance.  However, I have been able to use this medication impatiens successfully at 500 mcg daily and if they develop GI distress then do 500 mcg every other day.  Can send prescription in for 500 mcg daily.  If the patient develops symptoms then we can switch him to 500 mcg every other day.

## 2021-11-05 ENCOUNTER — Ambulatory Visit: Payer: Medicare HMO | Admitting: Pulmonary Disease

## 2021-11-16 ENCOUNTER — Other Ambulatory Visit: Payer: Self-pay | Admitting: Internal Medicine

## 2021-11-16 NOTE — Telephone Encounter (Signed)
Requested medications are due for refill today.  yes  Requested medications are on the active medications list.  yes  Last refill. 10/16/2021 #90 0 rf  Future visit scheduled.   no  Notes to clinic.  Medication refill is not delegated.    Requested Prescriptions  Pending Prescriptions Disp Refills   ALPRAZolam (XANAX) 0.5 MG tablet [Pharmacy Med Name: ALPRAZOLAM 0.5MG  TABLETS] 90 tablet     Sig: TAKE 1 TABLET BY MOUTH THREE TIMES DAILY     Not Delegated - Psychiatry: Anxiolytics/Hypnotics 2 Failed - 11/16/2021  9:16 AM      Failed - This refill cannot be delegated      Failed - Urine Drug Screen completed in last 360 days      Passed - Patient is not pregnant      Passed - Valid encounter within last 6 months    Recent Outpatient Visits           2 months ago Aortic atherosclerosis [facility])   [facility] [place], Coralie Keens, NP   8 months ago Encounter for general adult medical examination with abnormal findings   [facility] [place], Coralie Keens, NP   1 year ago Anxiety state   [facility] [place], Coralie Keens, NP

## 2021-11-20 ENCOUNTER — Ambulatory Visit: Payer: Medicare HMO | Admitting: Pulmonary Disease

## 2021-11-20 ENCOUNTER — Encounter: Payer: Self-pay | Admitting: Pulmonary Disease

## 2021-11-20 VITALS — BP 112/62 | HR 60 | Temp 97.8°F | Ht 68.0 in | Wt 124.2 lb

## 2021-11-20 DIAGNOSIS — J449 Chronic obstructive pulmonary disease, unspecified: Secondary | ICD-10-CM

## 2021-11-20 DIAGNOSIS — J209 Acute bronchitis, unspecified: Secondary | ICD-10-CM

## 2021-11-20 DIAGNOSIS — J440 Chronic obstructive pulmonary disease with acute lower respiratory infection: Secondary | ICD-10-CM

## 2021-11-20 DIAGNOSIS — J439 Emphysema, unspecified: Secondary | ICD-10-CM | POA: Diagnosis not present

## 2021-11-20 DIAGNOSIS — G4736 Sleep related hypoventilation in conditions classified elsewhere: Secondary | ICD-10-CM | POA: Diagnosis not present

## 2021-11-20 DIAGNOSIS — F17210 Nicotine dependence, cigarettes, uncomplicated: Secondary | ICD-10-CM

## 2021-11-20 DIAGNOSIS — R64 Cachexia: Secondary | ICD-10-CM

## 2021-11-20 DIAGNOSIS — R0602 Shortness of breath: Secondary | ICD-10-CM

## 2021-11-20 MED ORDER — THEOPHYLLINE ER 100 MG PO TB12: 100.0000 mg | ORAL_TABLET | Freq: Two times a day (BID) | ORAL | 2 refills | Status: DC

## 2021-11-20 MED ORDER — METHYLPREDNISOLONE 4 MG PO TBPK: ORAL_TABLET | ORAL | 0 refills | Status: DC

## 2021-11-20 MED ORDER — AZITHROMYCIN 250 MG PO TABS: ORAL_TABLET | ORAL | 0 refills | Status: AC

## 2021-11-20 NOTE — Patient Instructions (Addendum)
I have sent in prescriptions for a Medrol Dosepak and an antibiotic to help you with your breathing.  I have also started you on a medication to take twice a day to see if that helps with your issues with mucus production etc.  Started you on a low dose we may be able to increase it in a future visit.  We are ordering a heart test to check on your heart.  Please make every effort to quit smoking.  We will see you in follow-up in 6 to 8 weeks time call sooner should any new problems arise.

## 2021-11-20 NOTE — Progress Notes (Signed)
Subjective:    Patient ID: Elijah Mcfarland, male    DOB: 14-Dec-1950, 71 y.o.   MRN: [id number] Patient Care Team: Lorre Munroe, NP as PCP - General (Internal Medicine) Ronney Asters Jackelyn Poling, RPH-CPP as Pharmacist Salena Saner, MD as Consulting Physician (Pulmonary Disease)  Chief Complaint  Patient presents with   Follow-up    COPD. SOB with exertion. Wheezing and coughing. Clear sputum. 2L of O2 at night.     HPI Elijah Mcfarland is a 71 year old current smoker (8 to 10 cigarettes/day) who presents for follow-up on the issue of COPD Gold class III.  This is a scheduled visit.  He was last seen here on 13 August 2021 at that time he had a mild exacerbation and was treated with azithromycin and Medrol Dosepak. He did well with treatment for exacerbation. He has cut back on smoking and is down to 8 to 10 cigarettes a day.  He still has significant sputum production during the day whitish to gray.  This is a chronic issue.  However, over the last 2 to 3 days his sputum has changed in character from pale yellow to green.  He has not had any fevers, chills or sweats.  No hemoptysis.  Otherwise feels that he is quite at baseline.  He does not endorse any other symptomatology.  Dyspnea is about the same.  He is compliant with nocturnal oxygen.   At his his prior visit he was also prescribed Daliresp however he could not afford the medication and is not on this currently.  DATA 03/28/2019 echocardiogram: LVEF 55 to 60%, no regional wall motion abnormality, grade 1 DD, normal right ventricular systolic function.  No valvular abnormalities 03/29/2019 PFTs: FEV1 1.17 L or 40% predicted, FVC 2.68 L or 68% predicted, FEV1/FVC 44%, there is bronchodilator response with regards to air trapping.  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  FEV1 1.23 L or 43% predicted,  FVC 2.90 L or 76% predicted, FEV1/FVC 42%, there is bronchodilator response with regards to air trapping.  There is air trapping without hyperinflation, small airways component.  Diffusion capacity severely reduced as prior.  Overall no significant change.  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   Stage 3 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 (HCC) 04/25/2006   Arthritis 04/25/2006   Social History   Tobacco Use   Smoking status: Every Day    Packs/day: 0.75    Years: 51.00    Additional pack years: 0.00    Total pack years: 38.25    Types: Cigarettes   Smokeless tobacco: Never   Tobacco comments:    8-10 cigarettes daily--11/20/2021  Substance Use Topics   Alcohol use: No    Alcohol/week: 0.0 standard drinks of alcohol   Allergies  Allergen Reactions   Nsaids Other (See Comments)    GI bleed and vomiting   Current Meds  Medication Sig   acetaminophen (TYLENOL) 325 MG tablet Take 2 tablets (650 mg total) by mouth every 6 (six) hours as needed for mild pain or headache (or Fever >/= 101).   albuterol (VENTOLIN HFA) 108 (90 Base) MCG/ACT inhaler INHALE 2 PUFFS INTO THE LUNGS EVERY 6 HOURS AS NEEDED FOR WHEEZING OR SHORTNESS OF BREATH   ALPRAZolam (XANAX) 0.5 MG tablet TAKE 1 TABLET BY MOUTH THREE TIMES DAILY   atorvastatin (LIPITOR) 20 MG tablet TAKE 1 TABLET EVERY DAY   Budeson-Glycopyrrol-Formoterol (BREZTRI AEROSPHERE) 160-9-4.8  MCG/ACT AERO Inhale 2 puffs into the lungs in the morning and at bedtime.   citalopram (CELEXA) 40 MG tablet TAKE 1 TABLET EVERY DAY   levocetirizine (XYZAL) 5 MG tablet Take 5 mg by mouth every evening.   losartan (COZAAR) 50 MG tablet Take 0.5 tablets (25 mg total) by mouth daily.   losartan (COZAAR) 50 MG tablet Take 0.5 tablets (25 mg total) by mouth daily.   Nutritional Supplements (NUTRITIONAL SUPPLEMENT PO) Take by mouth. Super Beta Prostate Advanced   OXYGEN Inhale into the lungs at bedtime.   phenytoin (DILANTIN) 100 MG ER capsule TAKE 3 CAPSULES EVERY DAY   tamsulosin (FLOMAX) 0.4 MG CAPS capsule Take 1 capsule (0.4 mg total) by mouth daily.   Immunization History  Administered Date(s) Administered   Fluad Quad(high Dose 65+) 11/17/2018, 02/05/2020, 03/10/2021   H1N1 01/05/2008   Influenza Whole 11/07/2007   Influenza, High Dose Seasonal PF 03/03/2018   Influenza, Seasonal, Injecte, Preservative Fre 04/13/2013   Influenza,inj,Quad PF,6+ Mos 11/14/2014, 02/19/2016, 02/18/2017   Moderna SARS-COV2 Booster Vaccination 12/26/2019   Moderna Sars-Covid-2 Vaccination 04/22/2019, 05/20/2019   Pneumococcal Conjugate-13 11/14/2014, 02/05/2020   Pneumococcal Polysaccharide-23 11/07/2007, 02/19/2016, 03/03/2018   Td 09/06/2008       Objective:   Physical Exam BP 112/62 (BP Location: Left Arm, Cuff Size: Normal)   Pulse 60   Temp 97.8 F (36.6 C)   Ht 5\' 8"  (1.727 m)   Wt 124 lb 3.2 oz (56.3 kg)   SpO2 (!) 87%   BMI 18.88 kg/m  GENERAL: Thin, well-developed male, no overt respiratory distress, fully ambulatory. HEAD: Normocephalic, atraumatic. EYES: Pupils equal, round, reactive to light.  No scleral icterus. MOUTH: Nose/mouth/throat not examined due to masking requirements for COVID 19. NECK: Supple. No thyromegaly. No nodules. No JVD.  Trachea midline PULMONARY: Symmetrical air entry, no accessory muscle use noted today, diffuse wheezes noted, no other adventitious sounds,  breath sounds are coarse. CARDIOVASCULAR: S1 and S2. Regular rate and rhythm.  No overt murmurs, gallops or rubs noted. GASTROINTESTINAL: No distention.  Benign. MUSCULOSKELETAL: No joint deformity, no clubbing, no edema. NEUROLOGIC: No focal deficits, fluent speech, awake, alert. SKIN: Intact,warm,dry.  No rashes noted on limited exam. PSYCH: Normal mood and behavior     Assessment & Plan:     ICD-10-CM   1. Stage 3 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 (HCC)  J44.9    Continue Breztri 2 puffs twice a day Continue as needed albuterol Theophylline and lieu of Daliresp    2. Shortness of breath  R06.02 ECHOCARDIOGRAM COMPLETE   Obtain 2D echo Query pulmonary hypertension    3. Nocturnal hypoxemia due to emphysema (HCC)  J43.9    G47.36    Continue oxygen at 2 L/min Patient compliant and notes benefit of therapy    4. 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 Tobacco dependence due to cigarettes  F17.210  Patient counseled with regards to discontinuation of smoking Counseling time 3 to 5 minutes     Orders Placed This Encounter  Procedures   ECHOCARDIOGRAM COMPLETE    Standing Status:   Future    Number of Occurrences:   1    Standing Expiration Date:   11/21/2022    Order Specific Question:   Where should this test be performed    Answer:   [facility] [place]    Order Specific Question:   Perflutren DEFINITY (image enhancing agent) should be administered unless hypersensitivity or allergy exist    Answer:   Administer Perflutren    Order Specific Question:   Reason for exam-Echo    Answer:   Dyspnea  R06.00   Meds ordered this encounter  Medications   methylPREDNISolone (MEDROL DOSEPAK) 4 MG TBPK tablet    Sig: Take as directed in the package.    Dispense:  21 tablet    Refill:  0   azithromycin (ZITHROMAX) 250 MG tablet    Sig: Take 2 tablets (500 mg) on  Day 1,  followed by 1 tablet (250 mg) once daily on Days 2 through 5.    Dispense:  6 each     Refill:  0   theophylline (THEODUR) 100 MG 12 hr tablet    Sig: Take 1 tablet (100 mg total) by mouth 2 (two) times daily.    Dispense:  60 tablet    Refill:  2   Will see the patient in follow-up in 6 to 8 weeks time he is to contact us prior to that time should any new difficulties arise.  Elijah Shelter, MD Advanced Bronchoscopy PCCM [facility]    *This note was dictated using voice recognition software/Dragon.  Despite best efforts to proofread, errors can occur which can change the meaning. Any transcriptional errors that result from this process are unintentional and may not be fully corrected at the time of dictation.

## 2021-11-24 ENCOUNTER — Telehealth: Payer: Self-pay | Admitting: Internal Medicine

## 2021-11-24 NOTE — Telephone Encounter (Signed)
Needs an oxygen order with current liter flow and diagnosis  Best contact: Gerald Stabs from [facility] with [facility] Fax: [phone] attn: [place]   They are taking over his Oxygen supply

## 2021-11-27 ENCOUNTER — Other Ambulatory Visit: Payer: Self-pay | Admitting: Internal Medicine

## 2021-11-27 DIAGNOSIS — F411 Generalized anxiety disorder: Secondary | ICD-10-CM

## 2021-11-27 NOTE — Telephone Encounter (Signed)
Requested Prescriptions  Pending Prescriptions Disp Refills  . atorvastatin (LIPITOR) 20 MG tablet [Pharmacy Med Name: ATORVASTATIN CALCIUM 20 MG Tablet] 90 tablet 0    Sig: TAKE 1 TABLET EVERY DAY     Cardiovascular:  Antilipid - Statins Failed - 11/27/2021 11:19 AM      Failed - Lipid Panel in normal range within the last 12 months    Cholesterol  Date Value Ref Range Status  09/14/2021 148 <200 mg/dL Final   LDL Cholesterol (Calc)  Date Value Ref Range Status  09/14/2021 72 mg/dL (calc) Final    Comment:    Reference range: <100 . Desirable range <100 mg/dL for primary prevention;   <70 mg/dL for patients with CHD or diabetic patients  with > or = 2 CHD risk factors. Marland Kitchen LDL-C is now calculated using the Martin-Hopkins  calculation, which is a validated novel method providing  better accuracy than the Friedewald equation in the  estimation of LDL-C.  Cresenciano Genre et al. Annamaria Helling. 8182;993(71): 2061-2068  (http://education.[facility].com/faq/FAQ164)    HDL  Date Value Ref Range Status  09/14/2021 55 > OR = 40 mg/dL Final   Triglycerides  Date Value Ref Range Status  09/14/2021 130 <150 mg/dL Final         Passed - Patient is not pregnant      Passed - Valid encounter within last 12 months    Recent Outpatient Visits          2 months ago Aortic atherosclerosis ([place])   [facility] [place], Coralie Keens, NP   8 months ago Encounter for general adult medical examination with abnormal findings   [facility] [place], Coralie Keens, NP   1 year ago Anxiety state   [facility] [place], Coralie Keens, NP             . phenytoin (DILANTIN) 100 MG ER capsule [Pharmacy Med Name: PHENYTOIN SODIUM EXTENDED 100 MG Capsule] 270 capsule 10    Sig: TAKE 3 [place]     Not Delegated - Neurology:  Anticonvulsants - phenytoin Failed - 11/27/2021 11:19 AM      Failed - This refill cannot be delegated      Failed - HGB in normal range and  within 360 days    Hemoglobin  Date Value Ref Range Status  09/14/2021 12.1 (L) 13.2 - 17.1 g/dL Final         Failed - HCT in normal range and within 360 days    HCT  Date Value Ref Range Status  09/14/2021 36.1 (L) 38.5 - 50.0 % Final         Passed - ALT in normal range and within 360 days    ALT  Date Value Ref Range Status  09/14/2021 13 9 - 46 U/L Final         Passed - AST in normal range and within 360 days    AST  Date Value Ref Range Status  09/14/2021 17 10 - 35 U/L Final         Passed - PLT in normal range and within 360 days    Platelets  Date Value Ref Range Status  09/14/2021 191 140 - 400 Thousand/uL Final         Passed - WBC in normal range and within 360 days    WBC  Date Value Ref Range Status  09/14/2021 6.9 3.8 - 10.8 Thousand/uL Final  Passed - Phenytoin (serum) in normal range and within 360 days    Phenytoin, Total  Date Value Ref Range Status  03/10/2021 10.3 10.0 - 20.0 mg/L Final         Passed - Completed PHQ-2 or PHQ-9 in the last 360 days      Passed - Patient is not pregnant      Passed - Valid encounter within last 12 months    Recent Outpatient Visits          2 months ago Aortic atherosclerosis ([place])   [facility], Coralie Keens, NP   8 months ago Encounter for general adult medical examination with abnormal findings   [facility], Coralie Keens, NP   1 year ago Anxiety state   [facility] [place], [place] W, NP             . citalopram ([place]) 40 MG tablet [Pharmacy Med Name: CITALOPRAM HYDROBROMIDE 40 MG Tablet] 90 tablet 0    Sig: TAKE [place]     Psychiatry:  Antidepressants - SSRI Passed - 11/27/2021 11:19 AM      Passed - Valid encounter within last 6 months    Recent Outpatient Visits          2 months ago Aortic atherosclerosis [facility])   [facility] [place], Coralie Keens, NP   8 months ago Encounter for general adult medical  examination with abnormal findings   [facility] [place], Coralie Keens, NP   1 year ago Anxiety state   [facility] [place], Coralie Keens, [place]

## 2021-11-27 NOTE — Telephone Encounter (Signed)
This should be managed by pulmonology, not me

## 2021-11-27 NOTE — Telephone Encounter (Signed)
[place] from [facility] advised.   Thanks,   -Mickel Baas

## 2021-11-27 NOTE — Telephone Encounter (Signed)
Requested medication (s) are due for refill today: yes  Requested medication (s) are on the active medication list yes  Last refill:  05/13/21  Future visit scheduled:yes  Notes to clinic:  Unable to refill per protocol, cannot delegate.      Requested Prescriptions  Pending Prescriptions Disp Refills   phenytoin (DILANTIN) 100 MG ER capsule [Pharmacy Med Name: PHENYTOIN SODIUM EXTENDED 100 MG Capsule] 270 capsule 10    Sig: TAKE 3 CAPSULES EVERY DAY     Not Delegated - Neurology:  Anticonvulsants - phenytoin Failed - 11/27/2021 11:19 AM      Failed - This refill cannot be delegated      Failed - HGB in normal range and within 360 days    Hemoglobin  Date Value Ref Range Status  09/14/2021 12.1 (L) 13.2 - 17.1 g/dL Final         Failed - HCT in normal range and within 360 days    HCT  Date Value Ref Range Status  09/14/2021 36.1 (L) 38.5 - 50.0 % Final         Passed - ALT in normal range and within 360 days    ALT  Date Value Ref Range Status  09/14/2021 13 9 - 46 U/L Final         Passed - AST in normal range and within 360 days    AST  Date Value Ref Range Status  09/14/2021 17 10 - 35 U/L Final         Passed - PLT in normal range and within 360 days    Platelets  Date Value Ref Range Status  09/14/2021 191 140 - 400 Thousand/uL Final         Passed - WBC in normal range and within 360 days    WBC  Date Value Ref Range Status  09/14/2021 6.9 3.8 - 10.8 Thousand/uL Final         Passed - Phenytoin (serum) in normal range and within 360 days    Phenytoin, Total  Date Value Ref Range Status  03/10/2021 10.3 10.0 - 20.0 mg/L Final         Passed - Completed PHQ-2 or PHQ-9 in the last 360 days      Passed - Patient is not pregnant      Passed - Valid encounter within last 12 months    Recent Outpatient Visits           2 months ago Aortic atherosclerosis ([place])   [facility], Coralie Keens, NP   8 months ago Encounter for general  adult medical examination with abnormal findings   [facility] [place], Coralie Keens, NP   1 year ago Anxiety state   [facility] [place], Coralie Keens, NP              Signed Prescriptions Disp Refills   atorvastatin (LIPITOR) 20 MG tablet 90 tablet 0    Sig: TAKE 1 TABLET EVERY DAY     Cardiovascular:  Antilipid - Statins Failed - 11/27/2021 11:19 AM      Failed - Lipid Panel in normal range within the last 12 months    Cholesterol  Date Value Ref Range Status  09/14/2021 148 <200 mg/dL Final   LDL Cholesterol (Calc)  Date Value Ref Range Status  09/14/2021 72 mg/dL (calc) Final    Comment:    Reference range: <100 . Desirable range <100 mg/dL for primary prevention;   <  70 mg/dL for patients with CHD or diabetic patients  with > or = 2 CHD risk factors. Marland Kitchen LDL-C is now calculated using the Martin-Hopkins  calculation, which is a validated novel method providing  better accuracy than the Friedewald equation in the  estimation of LDL-C.  Cresenciano Genre et al. Annamaria Helling. 8338;250(53): 2061-2068  (http://education.[facility].com/faq/FAQ164)    HDL  Date Value Ref Range Status  09/14/2021 55 > OR = 40 mg/dL Final   Triglycerides  Date Value Ref Range Status  09/14/2021 130 <150 mg/dL Final         Passed - Patient is not pregnant      Passed - Valid encounter within last 12 months    Recent Outpatient Visits           2 months ago Aortic atherosclerosis ([place])   [facility], Coralie Keens, NP   8 months ago Encounter for general adult medical examination with abnormal findings   [facility], Coralie Keens, NP   1 year ago Anxiety state   [facility] [place], [place] W, NP               citalopram (CELEXA) 40 MG tablet 90 tablet 0    Sig: TAKE [place]     Psychiatry:  Antidepressants - SSRI Passed - 11/27/2021 11:19 AM      Passed - Valid encounter within last 6 months     Recent Outpatient Visits           2 months ago Aortic atherosclerosis [facility])   [facility] [place], Coralie Keens, NP   8 months ago Encounter for general adult medical examination with abnormal findings   [facility] [place], Coralie Keens, NP   1 year ago Anxiety state   [facility] [place], Coralie Keens, [place]

## 2021-12-09 ENCOUNTER — Telehealth: Payer: Self-pay | Admitting: Internal Medicine

## 2021-12-09 NOTE — Telephone Encounter (Signed)
N/A unable to leave a message for patient to call back and schedule the Medicare Annual Wellness Visit (AWV) virtually or by telephone.  Last AWV 02/05/20  Please schedule at anytime with [place].  Any questions, please call me at [phone]

## 2021-12-16 ENCOUNTER — Other Ambulatory Visit: Payer: Self-pay | Admitting: Internal Medicine

## 2021-12-16 NOTE — Telephone Encounter (Signed)
Requested medication (s) are due for refill today:   Provider to review  Requested medication (s) are on the active medication list:   Yes  Future visit scheduled:   No   Last ordered: 11/17/2021 #90, 0 refills  Non delegated refill   Requested Prescriptions  Pending Prescriptions Disp Refills   ALPRAZolam (XANAX) 0.5 MG tablet [Pharmacy Med Name: ALPRAZOLAM 0.5MG  TABLETS] 90 tablet     Sig: TAKE 1 TABLET BY MOUTH THREE TIMES DAILY     Not Delegated - Psychiatry: Anxiolytics/Hypnotics 2 Failed - 12/16/2021  9:06 AM      Failed - This refill cannot be delegated      Failed - Urine Drug Screen completed in last 360 days      Passed - Patient is not pregnant      Passed - Valid encounter within last 6 months    Recent Outpatient Visits           3 months ago Aortic atherosclerosis ([place])   [facility], Coralie Keens, NP   9 months ago Encounter for general adult medical examination with abnormal findings   [facility] [place], Coralie Keens, NP   1 year ago Anxiety state   [facility] [place], Coralie Keens, [place]

## 2021-12-22 ENCOUNTER — Telehealth: Payer: Self-pay

## 2021-12-22 NOTE — Telephone Encounter (Signed)
Pt has scheduled his AWV for 01/01/2022 by telephone.

## 2021-12-22 NOTE — Telephone Encounter (Signed)
I left a message for the patient to call back and schedule their Medicare Annual Wellness Visit (AWV) virtually, by telephone, or face-to-face.   [place]ream [place], CMA [phone]  

## 2022-01-01 ENCOUNTER — Ambulatory Visit (INDEPENDENT_AMBULATORY_CARE_PROVIDER_SITE_OTHER): Payer: Medicare HMO

## 2022-01-01 VITALS — Ht 68.0 in | Wt 124.0 lb

## 2022-01-01 DIAGNOSIS — Z0000 Encounter for general adult medical examination without abnormal findings: Secondary | ICD-10-CM | POA: Diagnosis not present

## 2022-01-01 NOTE — Patient Instructions (Signed)
Elijah Mcfarland , Thank you for taking time to come for your Medicare Wellness Visit. I appreciate your ongoing commitment to your health goals. Please review the following plan we discussed and let me know if I can assist you in the future.   Screening recommendations/referrals: Colonoscopy: 02/27/20, every 3 years Recommended yearly ophthalmology/optometry visit for glaucoma screening and checkup Recommended yearly dental visit for hygiene and checkup  Vaccinations: Influenza vaccine: 03/10/21 Pneumococcal vaccine: 02/05/20 Tdap vaccine: 09/06/08, due if have injury Shingles vaccine: n/d   Covid-19: 04/22/19, 05/20/19, 12/26/19  Advanced directives: no  Conditions/risks identified: none  Next appointment: Follow up in one year for your annual wellness visit. 01/07/23 @ 9:45 am by phone  Preventive Care 65 Years and Older, Male Preventive care refers to lifestyle choices and visits with your health care provider that can promote health and wellness. What does preventive care include? A yearly physical exam. This is also called an annual well check. Dental exams once or twice a year. Routine eye exams. Ask your health care provider how often you should have your eyes checked. Personal lifestyle choices, including: Daily care of your teeth and gums. Regular physical activity. Eating a healthy diet. Avoiding tobacco and drug use. Limiting alcohol use. Practicing safe sex. Taking low doses of aspirin every day. Taking vitamin and mineral supplements as recommended by your health care provider. What happens during an annual well check? The services and screenings done by your health care provider during your annual well check will depend on your age, overall health, lifestyle risk factors, and family history of disease. Counseling  Your health care provider may ask you questions about your: Alcohol use. Tobacco use. Drug use. Emotional well-being. Home and relationship  well-being. Sexual activity. Eating habits. History of falls. Memory and ability to understand (cognition). Work and work Statistician. Screening  You may have the following tests or measurements: Height, weight, and BMI. Blood pressure. Lipid and cholesterol levels. These may be checked every 5 years, or more frequently if you are over 14 years old. Skin check. Lung cancer screening. You may have this screening every year starting at age 74 if you have a 30-pack-year history of smoking and currently smoke or have quit within the past 15 years. Fecal occult blood test (FOBT) of the stool. You may have this test every year starting at age 76. Flexible sigmoidoscopy or colonoscopy. You may have a sigmoidoscopy every 5 years or a colonoscopy every 10 years starting at age 26. Prostate cancer screening. Recommendations will vary depending on your family history and other risks. Hepatitis C blood test. Hepatitis B blood test. Sexually transmitted disease (STD) testing. Diabetes screening. This is done by checking your blood sugar (glucose) after you have not eaten for a while (fasting). You may have this done every 1-3 years. Abdominal aortic aneurysm (AAA) screening. You may need this if you are a current or former smoker. Osteoporosis. You may be screened starting at age 24 if you are at high risk. Talk with your health care provider about your test results, treatment options, and if necessary, the need for more tests. Vaccines  Your health care provider may recommend certain vaccines, such as: Influenza vaccine. This is recommended every year. Tetanus, diphtheria, and acellular pertussis (Tdap, Td) vaccine. You may need a Td booster every 10 years. Zoster vaccine. You may need this after age 13. Pneumococcal 13-valent conjugate (PCV13) vaccine. One dose is recommended after age 1. Pneumococcal polysaccharide (PPSV23) vaccine. One dose is recommended after  age 63. Talk to your health care  provider about which screenings and vaccines you need and how often you need them. This information is not intended to replace advice given to you by your health care provider. Make sure you discuss any questions you have with your health care provider. Document Released: 02/28/2015 Document Revised: 10/22/2015 Document Reviewed: 12/03/2014 Elsevier Interactive Patient Education  2017 [place] Prevention in the Home Falls can cause injuries. They can happen to people of all ages. There are many things you can do to make your home safe and to help prevent falls. What can I do on the outside of my home? Regularly fix the edges of walkways and driveways and fix any cracks. Remove anything that might make you trip as you walk through a door, such as a raised step or threshold. Trim any bushes or trees on the path to your home. Use bright outdoor lighting. Clear any walking paths of anything that might make someone trip, such as rocks or tools. Regularly check to see if handrails are loose or broken. Make sure that both sides of any steps have handrails. Any raised decks and porches should have guardrails on the edges. Have any leaves, snow, or ice cleared regularly. Use sand or salt on walking paths during winter. Clean up any spills in your garage right away. This includes oil or grease spills. What can I do in the bathroom? Use night lights. Install grab bars by the toilet and in the tub and shower. Do not use towel bars as grab bars. Use non-skid mats or decals in the tub or shower. If you need to sit down in the shower, use a plastic, non-slip stool. Keep the floor dry. Clean up any water that spills on the floor as soon as it happens. Remove soap buildup in the tub or shower regularly. Attach bath mats securely with double-sided non-slip rug tape. Do not have throw rugs and other things on the floor that can make you trip. What can I do in the bedroom? Use night lights. Make  sure that you have a light by your bed that is easy to reach. Do not use any sheets or blankets that are too big for your bed. They should not hang down onto the floor. Have a firm chair that has side arms. You can use this for support while you get dressed. Do not have throw rugs and other things on the floor that can make you trip. What can I do in the kitchen? Clean up any spills right away. Avoid walking on wet floors. Keep items that you use a lot in easy-to-reach places. If you need to reach something above you, use a strong step stool that has a grab bar. Keep electrical cords out of the way. Do not use floor polish or wax that makes floors slippery. If you must use wax, use non-skid floor wax. Do not have throw rugs and other things on the floor that can make you trip. What can I do with my stairs? Do not leave any items on the stairs. Make sure that there are handrails on both sides of the stairs and use them. Fix handrails that are broken or loose. Make sure that handrails are as long as the stairways. Check any carpeting to make sure that it is firmly attached to the stairs. Fix any carpet that is loose or worn. Avoid having throw rugs at the top or bottom of the stairs. If you do  have throw rugs, attach them to the floor with carpet tape. Make sure that you have a light switch at the top of the stairs and the bottom of the stairs. If you do not have them, ask someone to add them for you. What else can I do to help prevent falls? Wear shoes that: Do not have high heels. Have rubber bottoms. Are comfortable and fit you well. Are closed at the toe. Do not wear sandals. If you use a stepladder: Make sure that it is fully opened. Do not climb a closed stepladder. Make sure that both sides of the stepladder are locked into place. Ask someone to hold it for you, if possible. Clearly mark and make sure that you can see: Any grab bars or handrails. First and last steps. Where the  edge of each step is. Use tools that help you move around (mobility aids) if they are needed. These include: Canes. Walkers. Scooters. Crutches. Turn on the lights when you go into a dark area. Replace any light bulbs as soon as they burn out. Set up your furniture so you have a clear path. Avoid moving your furniture around. If any of your floors are uneven, fix them. If there are any pets around you, be aware of where they are. Review your medicines with your doctor. Some medicines can make you feel dizzy. This can increase your chance of falling. Ask your doctor what other things that you can do to help prevent falls. This information is not intended to replace advice given to you by your health care provider. Make sure you discuss any questions you have with your health care provider. Document Released: 11/28/2008 Document Revised: 07/10/2015 Document Reviewed: 03/08/2014 Elsevier Interactive Patient Education  2017 [employer].

## 2022-01-01 NOTE — Progress Notes (Signed)
Virtual Visit via Telephone Note  I connected with  Elijah Mcfarland on 01/01/22 at 11:30 AM EST by telephone and verified that I am speaking with the correct person using two identifiers.  Location: Patient: home Provider: [facility] Persons participating in the virtual visit: [place]   I discussed the limitations, risks, security and privacy concerns of performing an evaluation and management service by telephone and the availability of in person appointments. The patient expressed understanding and agreed to proceed.  Interactive audio and video telecommunications were attempted between this nurse and patient, however failed, due to patient having technical difficulties OR patient did not have access to video capability.  We continued and completed visit with audio only.  Some vital signs may be absent or patient reported.   Dionisio David, LPN  Subjective:   Elijah Mcfarland is a 71 y.o. male who presents for Medicare Annual/Subsequent preventive examination.  Review of Systems     Cardiac Risk Factors include: advanced age (>58men, >70 women);hypertension;male gender     Objective:    There were no vitals filed for this visit. There is no height or weight on file to calculate BMI.     01/01/2022   11:36 AM 02/27/2020    8:39 AM 03/09/2018    1:28 PM 03/06/2018    7:21 PM 03/06/2018    5:44 PM 03/06/2018   11:22 AM 03/01/2018    4:16 PM  Advanced Directives  Does Patient Have a Medical Advance Directive? No No No No No No No  Would patient like information on creating a medical advance directive? No - Patient declined  No - Patient declined No - Patient declined No - Patient declined No - Patient declined No - Patient declined    Current Medications (verified) Outpatient Encounter Medications as of 01/01/2022  Medication Sig   acetaminophen (TYLENOL) 325 MG tablet Take 2 tablets (650 mg total) by mouth every 6 (six) hours as needed for mild pain or headache  (or Fever >/= 101).   albuterol (VENTOLIN HFA) 108 (90 Base) MCG/ACT inhaler INHALE 2 PUFFS INTO THE LUNGS EVERY 6 HOURS AS NEEDED FOR WHEEZING OR SHORTNESS OF BREATH   ALPRAZolam (XANAX) 0.5 MG tablet TAKE 1 TABLET BY MOUTH THREE TIMES DAILY   atorvastatin (LIPITOR) 20 MG tablet TAKE 1 TABLET EVERY DAY   Budeson-Glycopyrrol-Formoterol (BREZTRI AEROSPHERE) 160-9-4.8 MCG/ACT AERO Inhale 2 puffs into the lungs in the morning and at bedtime.   citalopram (CELEXA) 40 MG tablet TAKE 1 TABLET EVERY DAY   levocetirizine (XYZAL) 5 MG tablet Take 5 mg by mouth every evening.   losartan (COZAAR) 50 MG tablet Take 0.5 tablets (25 mg total) by mouth daily.   Nutritional Supplements (NUTRITIONAL SUPPLEMENT PO) Take by mouth. Super Beta Prostate Advanced   OXYGEN Inhale into the lungs at bedtime.   phenytoin (DILANTIN) 100 MG ER capsule TAKE 3 CAPSULES EVERY DAY   roflumilast (DALIRESP) 500 MCG TABS tablet Take 1 tablet (500 mcg total) by mouth daily.   tamsulosin (FLOMAX) 0.4 MG CAPS capsule Take 1 capsule (0.4 mg total) by mouth daily.   theophylline (THEODUR) 100 MG 12 hr tablet Take 1 tablet (100 mg total) by mouth 2 (two) times daily.   methylPREDNISolone (MEDROL DOSEPAK) 4 MG TBPK tablet Take as directed in the package. (Patient not taking: Reported on 01/01/2022)   [DISCONTINUED] losartan (COZAAR) 50 MG tablet Take 0.5 tablets (25 mg total) by mouth daily.   No facility-administered encounter medications on file as of  01/01/2022.    Allergies (verified) Nsaids   History: Past Medical History:  Diagnosis Date   Chronic pain syndrome    Left knee   COPD (chronic obstructive pulmonary disease) (HCC)    DDD (degenerative disc disease), lumbar 11/2008   L5-S1 on plain film   Emphysema lung (HCC)    GAD (generalized anxiety disorder)    GERD (gastroesophageal reflux disease)    History of multiple pulmonary nodules 07/2010   Follow up CT scans showed resolution by 03/2011--NO MALIGNANCY.    History of pneumonia 06/2010   HTN (hypertension) 01/07/2014   Osteoarthritis of left knee    + past injury of this knee--tibia fracture?  +left leg shorter than right   Seizure (HCC)    Seizure disorder ([place])    3 total seizures (first was 1995); neuro eval done and recommended lifetime phenytoin (he had a seizure after coming off the med in the late 90s   Tobacco dependence    Past Surgical History:  Procedure Laterality Date   COLONOSCOPY  2010   At [facility]; normal per pt   COLONOSCOPY WITH PROPOFOL N/A 09/28/2017   Procedure: COLONOSCOPY WITH PROPOFOL;  Surgeon: [name], MD;  Location: [facility] ENDOSCOPY;  Service: Gastroenterology;  Laterality: N/A;   COLONOSCOPY WITH PROPOFOL N/A 10/13/2017   Procedure: COLONOSCOPY WITH PROPOFOL;  Surgeon: Lin Landsman, MD;  Location: [facility] ENDOSCOPY;  Service: Gastroenterology;  Laterality: N/A;   COLONOSCOPY WITH PROPOFOL N/A 02/27/2020   Procedure: COLONOSCOPY WITH PROPOFOL;  Surgeon: Lin Landsman, MD;  Location[facility] ENDOSCOPY;  Service: Gastroenterology;  Laterality: N/A;   ESOPHAGOGASTRODUODENOSCOPY N/A 03/01/2018   Procedure: ESOPHAGOGASTRODUODENOSCOPY (EGD);  Surgeon: Rogene Houston, MD;  [facility] ENDO SUITE;  Service: Endoscopy;  Laterality: N/A;   ESOPHAGOGASTRODUODENOSCOPY N/A 03/06/2018   Procedure: ESOPHAGOGASTRODUODENOSCOPY (EGD);  Surgeon: Rogene Houston, MD;  Location[facility] ENDO SUITE;  Service: Endoscopy;  Laterality: N/A;   ESOPHAGOGASTRODUODENOSCOPY N/A 03/09/2018   Procedure: ESOPHAGOGASTRODUODENOSCOPY (EGD);  Surgeon: Rogene Houston, MD;  Location[facility] ENDO SUITE;  Service: Endoscopy;  Laterality: N/A;   GIVENS CAPSULE STUDY N/A 03/08/2018   Procedure: GIVENS CAPSULE STUDY;  Surgeon: Rogene Houston, MD;  Location[facility] ENDO SUITE;  Service: Endoscopy;  Laterality: N/A;   KNEE SURGERY     Left : x 2   Family History  Problem Relation Age of Onset   Heart disease Mother    Heart disease Father    Cancer Sister     Stroke Neg Hx    Social History   Socioeconomic History   Marital status: Married    Spouse name: Not on file   Number of children: Not on file   Years of education: Not on file   Highest education level: Not on file  Occupational History   Not on file  Tobacco Use   Smoking status: Every Day    Packs/day: 3.00    Years: 51.00    Total pack years: 153.00    Types: Cigarettes   Smokeless tobacco: Never   Tobacco comments:    8-10 daily--08/13/2021    8-10 daily 11/20/2021  Vaping Use   Vaping Use: Never used  Substance and Sexual Activity   Alcohol use: No    Alcohol/week: 0.0 standard drinks of alcohol   Drug use: Not Currently    Types: Marijuana    Comment: 2 month   Sexual activity: Yes    Partners: Male  Other Topics Concern   Not on file  Social  History Narrative   Married, 2 grown children   Eighth grade education.  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   Originally from [place].   Tobacco 100 pack-yr hx, still smoking as of 10/2011 (1 pack per day).   Denies alcohol or drug use.   No exercise.   Social Determinants of Health   Financial Resource Strain: Low Risk  (01/01/2022)   Overall Financial Resource Strain (CARDIA)    Difficulty of Paying Living Expenses: Not hard at all  Food Insecurity: No Food Insecurity (01/01/2022)   Hunger Vital Sign    Worried About Running Out of Food in the Last Year: Never true    Ran Out of Food in the Last Year: Never true  Transportation Needs: No Transportation Needs (01/01/2022)   PRAPARE - Hydrologist (Medical): No    Lack of Transportation (Non-Medical): No  Physical Activity: Sufficiently Active (01/01/2022)   Exercise Vital Sign    Days of Exercise per Week: 5 days    Minutes of Exercise per Session: 30 min  Stress: No Stress Concern Present (01/01/2022)   [place]    Feeling of Stress : Not  at all  Social Connections: Moderately Isolated (01/01/2022)   Social Connection and Isolation Panel [NHANES]    Frequency of Communication with Friends and Family: More than three times a week    Frequency of Social Gatherings with Friends and Family: More than three times a week    Attends Religious Services: Never    Marine scientist or Organizations: No    Attends Archivist Meetings: Never    Marital Status: Married    Tobacco Counseling Ready to quit: Not Answered Counseling given: Not Answered Tobacco comments: 8-10 daily--08/13/2021 8-10 daily 11/20/2021   Clinical Intake:  Pre-visit preparation completed: Yes  Pain : No/denies pain     Diabetes: No  How often do you need to have someone help you when you read instructions, pamphlets, or other written materials from your doctor or pharmacy?: 1 - Never  Diabetic?no  Interpreter Needed?: No  Information entered by :: Kirke Shaggy, LPN   Activities of Daily Living    01/01/2022   11:37 AM  In your present state of health, do you have any difficulty performing the following activities:  Hearing? 0  Vision? 0  Difficulty concentrating or making decisions? 0  Walking or climbing stairs? 0  Dressing or bathing? 0  Doing errands, shopping? 0  Preparing Food and eating ? N  Using the Toilet? N  In the past six months, have you accidently leaked urine? N  Do you have problems with loss of bowel control? N  Managing your Medications? N  Managing your Finances? N  Housekeeping or managing your Housekeeping? N    Patient Care Team: [name], NP as PCP - General (Internal Medicine) Curley Spice Virl Diamond, RPH-CPP as Pharmacist Tyler Pita, MD as Consulting Physician (Pulmonary Disease)  Indicate any recent Medical Services you may have received from other than [facility] providers in the past year (date may be approximate).     Assessment:   This is a routine wellness examination for  Elijah Mcfarland.  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- [place]  Dietary issues and exercise activities discussed: Current Exercise Habits: Home exercise routine, Type of exercise: walking, Time (Minutes): 30, Frequency (  Times/Week): 5, Weekly Exercise (Minutes/Week): 150, Intensity: Mild   Goals Addressed             This Visit's Progress    DIET - EAT MORE FRUITS AND VEGETABLES         Depression Screen    01/01/2022   11:34 AM 03/10/2021    1:25 PM 02/06/2020    4:11 PM 02/05/2020   12:10 PM 11/17/2018    3:09 PM 08/22/2017    2:16 PM 08/22/2017    2:15 PM  PHQ 2/9 Scores  PHQ - 2 Score 0 1 0 0 0 0 0  PHQ- 9 Score 0 7 0        Fall Risk    01/01/2022   11:37 AM 03/10/2021    1:26 PM 02/05/2020   12:10 PM 08/22/2017    2:16 PM 08/22/2017    2:15 PM  [place] in the past year? 0 0 0 No No  Number falls in past yr: 0 0 0    Injury with Fall? 0 0 0    Risk for fall due to : No Fall Risks No Fall Risks     Follow up Falls prevention discussed;Falls evaluation completed Falls evaluation completed Falls evaluation completed      FALL RISK PREVENTION PERTAINING TO THE HOME:  Any stairs in or around the home? No  If so, are there any without handrails? No  Home free of loose throw rugs in walkways, pet beds, electrical cords, etc? Yes  Adequate lighting in your home to reduce risk of falls? Yes   ASSISTIVE DEVICES UTILIZED TO PREVENT FALLS:  Life alert? No  Use of a cane, walker or w/c? No  Grab bars in the bathroom? Yes  Shower chair or bench in shower? No  Elevated toilet seat or a handicapped toilet? No   Cognitive Function:        01/01/2022   11:37 AM  6CIT Screen  What Year? 0 points  What month? 0 points  What time? 0 points  Count back from 20 0 points  Months in reverse 0 points  Repeat phrase 0 points  Total Score 0 points     Immunizations Immunization History  Administered Date(s) Administered   Fluad Quad(high Dose 65+) 11/17/2018, 02/05/2020, 03/10/2021   H1N1 01/05/2008   Influenza Whole 11/07/2007   Influenza, High Dose Seasonal PF 03/03/2018   Influenza, Seasonal, Injecte, Preservative Fre 04/13/2013   Influenza,inj,Quad PF,6+ Mos 11/14/2014, 02/19/2016, 02/18/2017   Moderna SARS-COV2 Booster Vaccination 12/26/2019   Moderna Sars-Covid-2 Vaccination 04/22/2019, 05/20/2019   Pneumococcal Conjugate-13 11/14/2014, 02/05/2020   Pneumococcal Polysaccharide-23 11/07/2007, 02/19/2016, 03/03/2018   Td 09/06/2008    TDAP status: Due, Education has been provided regarding the importance of this vaccine. Advised may receive this vaccine at local pharmacy or Health Dept. Aware to provide a copy of the vaccination record if obtained from local pharmacy or Health Dept. Verbalized acceptance and understanding.  Flu Vaccine status: Up to date  Pneumococcal vaccine status: Up to date  Covid-19 vaccine status: Completed vaccines  Qualifies for Shingles Vaccine? Yes   Zostavax completed No   Shingrix Completed?: No.    Education has been provided regarding the importance of this vaccine. Patient has been advised to call insurance company to determine out of pocket expense if they have not yet received this vaccine. Advised may also receive vaccine at local pharmacy or Health Dept. Verbalized acceptance and understanding.  Screening Tests Health Maintenance  Topic Date Due   Zoster Vaccines- Shingrix (1 of 2) Never done   COVID-19 Vaccine (3 - Moderna risk series) 01/23/2020   INFLUENZA VACCINE  09/15/2021   Lung Cancer Screening  01/26/2022   Medicare Annual Wellness (AWV)  01/02/2023   COLONOSCOPY (Pts 45-74yrs Insurance coverage will need to be confirmed)  02/27/2023   Pneumonia Vaccine 15+ Years old  Completed   Hepatitis C Screening  Completed   HPV VACCINES  Aged Out    Health Maintenance  Health  Maintenance Due  Topic Date Due   Zoster Vaccines- Shingrix (1 of 2) Never done   COVID-19 Vaccine (3 - Moderna risk series) 01/23/2020   INFLUENZA VACCINE  09/15/2021   Lung Cancer Screening  01/26/2022    Colorectal cancer screening: Type of screening: Colonoscopy. Completed 02/27/20. Repeat every 3 years  Lung Cancer Screening: (Low Dose CT Chest recommended if Age 50-80 years, 30 pack-year currently smoking OR have quit w/in 15years.) does not qualify.   Lung Cancer Screening Referral: scheduled 01/26/22  Additional Screening:  Hepatitis C Screening: does qualify; Completed 04/13/13  Vision Screening: Recommended annual ophthalmology exams for early detection of glaucoma and other disorders of the eye. Is the patient up to date with their annual eye exam?  Yes  Who is the provider or what is the name of the office in which the patient attends annual eye exams? [place] If pt is not established with a provider, would they like to be referred to a provider to establish care? No .   Dental Screening: Recommended annual dental exams for proper oral hygiene  Community Resource Referral / Chronic Care Management: CRR required this visit?  No   CCM required this visit?  No      Plan:     I have personally reviewed and noted the following in the patient's chart:   Medical and social history Use of alcohol, tobacco or illicit drugs  Current medications and supplements including opioid prescriptions. Patient is not currently taking opioid prescriptions. Functional ability and status Nutritional status Physical activity Advanced directives List of other physicians Hospitalizations, surgeries, and ER visits in previous 12 months Vitals Screenings to include cognitive, depression, and falls Referrals and appointments  In addition, I have reviewed and discussed with patient certain preventive protocols, quality metrics, and best practice recommendations. A written  personalized care plan for preventive services as well as general preventive health recommendations were provided to patient.     Dionisio David, LPN   16/11/9602   Nurse Notes: none

## 2022-01-13 ENCOUNTER — Ambulatory Visit: Payer: Medicare HMO | Admitting: Pharmacist

## 2022-01-13 DIAGNOSIS — J449 Chronic obstructive pulmonary disease, unspecified: Secondary | ICD-10-CM

## 2022-01-13 NOTE — Patient Instructions (Signed)
Goals Addressed             This Visit's Progress    Pharmacy Goals       Please use your Breztri 2 puffs twice a day.  Make sure you rinse your mouth well after you use it.  Please consider calling the [place] Quitline for their help with quitting smoking.  The  Quitline phone number is: [phone]  Feel free to call me with any questions or concerns. I look forward to our next call!   Wallace Cullens, PharmD, [place] [phone]

## 2022-01-13 NOTE — Chronic Care Management (AMB) (Signed)
01/13/2022 Name: Elijah Mcfarland MRN: [id number] DOB: 1950-06-30  Chief Complaint  Patient presents with   Medication Assistance    Elijah Mcfarland is a 71 y.o. year old male who presented for a telephone visit.   They were referred to the pharmacist by their PCP for assistance in managing medication access.   Subjective:  Care Team: Primary Care Provider: [name], NP  Pulmonologist: Tyler Pita, MD; Next Scheduled Visit: 02/10/2022   Medication Access/Adherence  Current Pharmacy:  [facility] [place], [address] AT [place] [place] [address] Phone: [phone] Fax: [phone]  [place], [address] [place] [address] Phone: [phone] Fax: [phone] - [place], [address] [place] [address] Phone: [phone] Fax: [phone]   Patient reports affordability concerns with their medications: Yes  Patient reports access/transportation concerns to their pharmacy: No  Patient reports adherence concerns with their medications:  No     COPD: Patient followed by [facility] Current treatment: Breztri - 2 puffs twice daily Albuterol as needed Confirms using Breztri inhaler twice daily (morning and evening) and rinsing mouth out after use From review of chart, at Office Visit with [place] on 11/20/2021, provider advised patient to start theophylline 100 mg twice daily to help with concern regarding mucus production Today patient reports he was unable to pick up the prescription from pharmacy Follow up with [place] on behalf of patient and find theophylline Rx not filled as pharmacy was unable to stock this medication. From review of patient's [facility] Medicare formulary, also find that theophylline is a tier  4 option for patient Will send message to patient's pulmonologist regarding patient was unable to obtain theophylline  Current medication access: enrolled in [place] patient assistance from [place] 09/04/20-02/14/22    Tobacco Use: Reports smoking ~1/2 pack of cigarettes/day Reports currently using nicotine patches and nicotine gum  Have counseled patient on nicotine therapy options, dosing and administration Patient not ready to set a quit date today, but rather continuing to work on cutting back further   Objective: Lab Results  Component Value Date   HGBA1C 5.1 09/14/2021    Lab Results  Component Value Date   CREATININE 0.75 09/14/2021   BUN 8 09/14/2021   NA 140 09/14/2021   K 4.9 09/14/2021   CL 105 09/14/2021   CO2 32 09/14/2021    Lab Results  Component Value Date   CHOL 148 09/14/2021   HDL 55 09/14/2021   LDLCALC 72 09/14/2021   TRIG 130 09/14/2021   CHOLHDL 2.7 09/14/2021   BP Readings from Last 3 Encounters:  11/20/21 112/62  09/14/21 106/64  08/13/21 122/74   Pulse Readings from Last 3 Encounters:  11/20/21 60  09/14/21 75  08/13/21 74     Medications Reviewed Today     Reviewed by Rennis Petty, RPH-CPP (Pharmacist) on 01/13/22 at 85  Med List Status: <None>   Medication Order Taking? Sig Documenting Provider Last Dose Status Informant  acetaminophen (TYLENOL) 325 MG tablet [id number]  Take 2 tablets (650 mg total) by mouth every 6 (six) hours as needed for mild pain or headache (or Fever >/= 101). Roxan Hockey, MD  Active Self  albuterol (VENTOLIN HFA) 108 (90 Base) MCG/ACT inhaler [id number]  INHALE 2 PUFFS  INTO THE LUNGS EVERY 6 HOURS AS NEEDED FOR WHEEZING OR SHORTNESS OF [place] Blas, MD  Active   ALPRAZolam [name]) 0.5 MG tablet [id number]  TAKE 1 TABLET BY MOUTH THREE TIMES DAILY [name], NP  Active   atorvastatin (LIPITOR) 20 MG tablet [id number]  TAKE 1 TABLET EVERY DAY Baity, Coralie Keens, NP  Active    Budeson-Glycopyrrol-Formoterol (BREZTRI AEROSPHERE) 160-9-4.8 MCG/ACT AERO [id number] Yes Inhale 2 puffs into the lungs in the morning and at bedtime. Tyler Pita, MD Taking Active   citalopram (CELEXA) 40 MG tablet [id number]  TAKE 1 TABLET EVERY DAY Baity, Coralie Keens, NP  Active   levocetirizine (XYZAL) 5 MG tablet [id number]  Take 5 mg by mouth every evening. [provider]  Active   losartan (COZAAR) 50 MG tablet [id number] Yes Take 0.5 tablets (25 mg total) by mouth daily. [name], NP Taking Active   Nutritional Supplements (NUTRITIONAL SUPPLEMENT PO) [id number]  Take by mouth. Super Beta Prostate Advanced [provider]  Active   OXYGEN [id number]  Inhale into the lungs at bedtime. [provider]  Active Self  phenytoin (DILANTIN) 100 MG ER capsule [id number]  TAKE 3 CAPSULES EVERY DAY Baity, Coralie Keens, NP  Active   roflumilast (DALIRESP) 500 MCG TABS tablet [id number]  Take 1 tablet (500 mcg total) by mouth daily. Tyler Pita, MD  Active   tamsulosin [facility]) 0.4 MG CAPS capsule [id number] Yes Take 1 capsule (0.4 mg total) by mouth daily. [name], NP Taking Active   theophylline (THEODUR) 100 MG 12 hr tablet [id number]  Take 1 tablet (100 mg total) by mouth 2 (two) times daily. 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 Will collaborate with provider, CPhT, and patient to pursue assistance.    Follow Up Plan: Clinical Pharmacist will outreach to patient by telephone on 03/08/2022 at 2:30 PM   Wallace Cullens, PharmD, [facility] [place] [phone]

## 2022-01-15 ENCOUNTER — Telehealth: Payer: Self-pay | Admitting: Pharmacy Technician

## 2022-01-15 ENCOUNTER — Other Ambulatory Visit: Payer: Self-pay | Admitting: Internal Medicine

## 2022-01-15 DIAGNOSIS — Z596 Low income: Secondary | ICD-10-CM

## 2022-01-15 NOTE — Progress Notes (Signed)
[place] [facility])                                            [place] Team    01/15/2022  Elijah Mcfarland Sep 13, 1950 [id number]                                      Medication Assistance Referral-FOR 2024 RE ENROLLMENT  Referral From: [facility] Embedded RPh Dorthula Perfect   Medication/Company: [name] / [place] Patient application portion:  Mailed Provider application portion: Faxed  to Dr. Vernard Gambles Provider address/fax verified via: Office website  Allea Kassner P. Deleah Tison, [place]  [phone]

## 2022-01-15 NOTE — Telephone Encounter (Signed)
Requested medication (s) are due for refill today: Yes  Requested medication (s) are on the active medication list: Yes  Last refill:  12/17/21  Future visit scheduled: No  Notes to clinic:  See request.    Requested Prescriptions  Pending Prescriptions Disp Refills   ALPRAZolam (XANAX) 0.5 MG tablet [Pharmacy Med Name: ALPRAZOLAM 0.5MG  TABLETS] 90 tablet     Sig: TAKE 1 TABLET BY MOUTH THREE TIMES DAILY     Not Delegated - Psychiatry: Anxiolytics/Hypnotics 2 Failed - 01/15/2022  9:14 AM      Failed - This refill cannot be delegated      Failed - Urine Drug Screen completed in last 360 days      Passed - Patient is not pregnant      Passed - Valid encounter within last 6 months    Recent Outpatient Visits           2 days ago Stage 3 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 ([place])   [place], Gray[place] Ormond A, RPH-CPP   4 months ago Aortic atherosclerosis [facility])   [facility] [place], Coralie Keens, NP   10 months ago Encounter for general adult medical examination with abnormal findings   [facility] [place], Coralie Keens, NP   1 year ago Anxiety state   [facility] [place], Coralie Keens, [place]

## 2022-01-26 ENCOUNTER — Ambulatory Visit: Payer: Medicare HMO | Attending: Internal Medicine

## 2022-01-27 ENCOUNTER — Ambulatory Visit: Payer: Medicare HMO | Attending: Pulmonary Disease

## 2022-01-27 DIAGNOSIS — R0602 Shortness of breath: Secondary | ICD-10-CM

## 2022-01-27 LAB — ECHOCARDIOGRAM COMPLETE
AR max vel: 3.5 cm2
AV Area VTI: 3.12 cm2
AV Area mean vel: 3.18 cm2
AV Mean grad: 2 mmHg
AV Peak grad: 3.7 mmHg
Ao pk vel: 0.96 m/s
Area-P 1/2: 2.17 cm2
Calc EF: 58.2 %
MV VTI: 1.14 cm2
S' Lateral: 2.7 cm
Single Plane A2C EF: 53.9 %
Single Plane A4C EF: 58.4 %

## 2022-02-10 ENCOUNTER — Ambulatory Visit: Payer: Medicare HMO | Admitting: Pulmonary Disease

## 2022-02-16 ENCOUNTER — Other Ambulatory Visit: Payer: Self-pay | Admitting: Internal Medicine

## 2022-02-17 NOTE — Telephone Encounter (Signed)
Requested medication (s) are due for refill today: routing for approval  Requested medication (s) are on the active medication list: yes  Last refill:  01/15/22  Future visit scheduled:yes  Notes to clinic:  Unable to refill per protocol, cannot delegate.      Requested Prescriptions  Pending Prescriptions Disp Refills   ALPRAZolam (XANAX) 0.5 MG tablet [Pharmacy Med Name: ALPRAZOLAM 0.5MG  TABLETS] 90 tablet     Sig: TAKE 1 TABLET BY MOUTH THREE TIMES DAILY     Not Delegated - Psychiatry: Anxiolytics/Hypnotics 2 Failed - 02/16/2022  9:04 AM      Failed - This refill cannot be delegated      Failed - Urine Drug Screen completed in last 360 days      Passed - Patient is not pregnant      Passed - Valid encounter within last 6 months    Recent Outpatient Visits           1 month ago Stage 3 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 ([place])   [place], Grayland Ormond A, RPH-CPP   5 months ago Aortic atherosclerosis [facility])   [facility], Coralie Keens, NP   11 months ago Encounter for general adult medical examination with abnormal findings   [facility] [place], Coralie Keens, NP   1 year ago Anxiety state   [facility] [place], Coralie Keens, NP       Future Appointments             In 3 weeks Garnette Gunner, Coralie Keens, NP [place], [facility]

## 2022-02-19 ENCOUNTER — Telehealth: Payer: Self-pay | Admitting: Pharmacy Technician

## 2022-02-19 DIAGNOSIS — Z596 Low income: Secondary | ICD-10-CM

## 2022-02-19 NOTE — Progress Notes (Signed)
[facility])                                            [place] Team    02/19/2022  Elijah Mcfarland 1950/09/29 [id number]  Received both patient and provider portion(s) of patient assistance application(s) for [employer]. Faxed completed application and required documents into AZ&ME.   Anila Bojarski P. [place], [place]  [phone]

## 2022-03-08 ENCOUNTER — Ambulatory Visit: Payer: Medicare HMO | Admitting: Pharmacist

## 2022-03-08 ENCOUNTER — Telehealth: Payer: Self-pay | Admitting: Pharmacy Technician

## 2022-03-08 DIAGNOSIS — Z596 Low income: Secondary | ICD-10-CM

## 2022-03-08 DIAGNOSIS — J449 Chronic obstructive pulmonary disease, unspecified: Secondary | ICD-10-CM

## 2022-03-08 NOTE — Patient Instructions (Signed)
Goals Addressed             This Visit's Progress    Pharmacy Goals       Please use your Breztri 2 puffs twice a day.  Make sure you rinse your mouth well after you use it.  Please consider calling the [place] Quitline for their help with quitting smoking.  The [place] Quitline phone number is: [phone]  Feel free to call me with any questions or concerns. I look forward to our next call!  Estelle Grumbles, PharmD, [facility] Clinical Pharmacist [facility] [phone]

## 2022-03-08 NOTE — Progress Notes (Signed)
03/08/2022 Name: Elijah Mcfarland MRN: [id number] DOB: [birth year]  Chief Complaint  Patient presents with   Medication Assistance    Elijah Mcfarland is a 72 y.o. year old male who presented for a telephone visit.   They were referred to the pharmacist by their PCP for assistance in managing medication access.    Subjective:  Care Team: Primary Care Provider: Lorre Munroe, NP; Next Scheduled Visit: 03/15/2022 Pulmonologist: Salena Saner, MD; Next Scheduled Visit: 03/18/2022  Medication Access/Adherence  Current Pharmacy:  [facility] [id number] - [place], [place] - [address] [place] [address] Phone: [phone] Fax: [phone]  [facility] - [place], [place] - [address] [place] [address] Phone: [phone] Fax: [phone]  [facility] - [place], [place] - [address] [place] [address] Phone: [phone] Fax: [phone]   Patient reports affordability concerns with their medications: No  Patient reports access/transportation concerns to their pharmacy: No  Patient reports adherence concerns with their medications:  No     COPD: Patient followed by  Pulmonary [place] Current treatment: Breztri - 2 puffs twice daily Albuterol as needed Confirms using Breztri inhaler twice daily (morning and evening) and rinsing mouth out after use   Current medication access: collaborated with [facility] CPhT for aid to patient with [facility] patient assistance from AZ&Me re-enrollment - Today received message from [place] advising patient APPROVED for re-enrollment through 02/15/2023 - Patient reports he received a shipment of 3 inhalers from program last week   Tobacco Use: Reports has reduced smoking to 1-2 cigarettes/day Reports currently using nicotine 14 mg/day patches and nicotine gum  Have counseled patient on  nicotine therapy options, dosing and administration Patient not ready to set a quit date today, but rather continuing to work on cutting back to quit     Objective:  Lab Results  Component Value Date   HGBA1C 5.1 09/14/2021    Lab Results  Component Value Date   CREATININE 0.75 09/14/2021   BUN 8 09/14/2021   NA 140 09/14/2021   K 4.9 09/14/2021   CL 105 09/14/2021   CO2 32 09/14/2021    Lab Results  Component Value Date   CHOL 148 09/14/2021   HDL 55 09/14/2021   LDLCALC 72 09/14/2021   TRIG 130 09/14/2021   CHOLHDL 2.7 09/14/2021    Medications Reviewed Today     Reviewed by Manuela Neptune, RPH-CPP (Pharmacist) on 01/13/22 at 1312  Med List Status: <None>   Medication Order Taking? Sig Documenting Provider Last Dose Status Informant  acetaminophen (TYLENOL) 325 MG tablet [id number]  Take 2 tablets (650 mg total) by mouth every 6 (six) hours as needed for mild pain or headache (or Fever >/= 101). Shon Hale, MD  Active Self  albuterol (VENTOLIN HFA) 108 (90 Base) MCG/ACT inhaler [id number]  INHALE 2 PUFFS INTO THE LUNGS EVERY 6 HOURS AS NEEDED FOR WHEEZING OR SHORTNESS OF Georgette Dover, MD  Active   ALPRAZolam Prudy Feeler) 0.5 MG tablet [id number]  TAKE 1 TABLET BY MOUTH THREE TIMES DAILY Lorre Munroe, NP  Active   atorvastatin (LIPITOR) 20 MG tablet [id number]  TAKE 1 TABLET EVERY DAY Baity, Salvadore Oxford, NP  Active   Budeson-Glycopyrrol-Formoterol (BREZTRI AEROSPHERE) 160-9-4.8 MCG/ACT AERO [id number] Yes Inhale 2 puffs into the lungs in the morning and at bedtime. Salena Saner,  MD Taking Active   citalopram (CELEXA) 40 MG tablet [id number]  TAKE 1 TABLET EVERY DAY Baity, Salvadore Oxford, NP  Active   levocetirizine (XYZAL) 5 MG tablet [id number]  Take 5 mg by mouth every evening. [provider]  Active   losartan (COZAAR) 50 MG tablet [id number] Yes Take 0.5 tablets (25 mg total) by mouth daily. Lorre Munroe, NP Taking Active   Nutritional  Supplements (NUTRITIONAL SUPPLEMENT PO) [id number]  Take by mouth. Super Beta Prostate Advanced [provider]  Active   OXYGEN [id number]  Inhale into the lungs at bedtime. [provider]  Active Self  phenytoin (DILANTIN) 100 MG ER capsule [id number]  TAKE 3 CAPSULES EVERY DAY Baity, Salvadore Oxford, NP  Active   roflumilast (DALIRESP) 500 MCG TABS tablet [id number]  Take 1 tablet (500 mcg total) by mouth daily. Salena Saner, MD  Active   tamsulosin [facility]) 0.4 MG CAPS capsule [id number] Yes Take 1 capsule (0.4 mg total) by mouth daily. Lorre Munroe, NP Taking Active   theophylline (THEODUR) 100 MG 12 hr tablet [id number]  Take 1 tablet (100 mg total) by mouth 2 (two) times daily. Salena Saner, MD  Active   Med List Note Su Monks Brendia Sacks, [place] 08/07/19 5175): UDS/CSA 07/06/19 repeat 10/06/2019              Assessment/Plan:   - Patient to follow up with AZ&Me program as needed for Breztri refills - Encourage patient to continue to work toward smoking cessation     Follow Up Plan: Clinical Pharmacist will outreach to patient by telephone on 07/14/2022 at 1 pm   Estelle Grumbles, PharmD, [facility] Clinical Pharmacist [facility] [phone]

## 2022-03-08 NOTE — Progress Notes (Signed)
[facility])                                            [facility] Quality Pharmacy Team    03/08/2022  Endy Easterly [birth year] [id number]  Care coordination call placed to AZ[employer]ME in regard to [facility] application.   Spoke to [place] who informs patient is APPROVED 02/15/22-02/15/23. Order will auto fill and ship to patient's home based on last fill date in 2023/2024 Patient may call AZ&ME at [phone] if shipment has not arrived and patient does not have sufficient supply.  Camryn Quesinberry P. Cedra Villalon, CPhT [facility]  [phone]

## 2022-03-10 ENCOUNTER — Other Ambulatory Visit: Payer: Self-pay

## 2022-03-10 DIAGNOSIS — Z87891 Personal history of nicotine dependence: Secondary | ICD-10-CM

## 2022-03-10 DIAGNOSIS — F17210 Nicotine dependence, cigarettes, uncomplicated: Secondary | ICD-10-CM

## 2022-03-11 ENCOUNTER — Other Ambulatory Visit: Payer: Self-pay | Admitting: Internal Medicine

## 2022-03-11 DIAGNOSIS — I10 Essential (primary) hypertension: Secondary | ICD-10-CM

## 2022-03-11 NOTE — Telephone Encounter (Signed)
Future visit in 4 days. Requested Prescriptions  Pending Prescriptions Disp Refills   tamsulosin (FLOMAX) 0.4 MG CAPS capsule [Pharmacy Med Name: TAMSULOSIN 0.4MG  CAPSULES] 90 capsule 0    Sig: TAKE 1 CAPSULE(0.4 MG) BY MOUTH DAILY     Urology: Alpha-Adrenergic Blocker Failed - 03/11/2022  3:32 AM      Failed - PSA in normal range and within 360 days    PSA  Date Value Ref Range Status  03/10/2021 1.12 < OR = 4.00 ng/mL Final    Comment:    The total PSA value from this assay system is  standardized against the WHO standard. The test  result will be approximately 20% lower when compared  to the equimolar-standardized total PSA (Beckman  Coulter). Comparison of serial PSA results should be  interpreted with this fact in mind. . This test was performed using the Siemens  chemiluminescent method. Values obtained from  different assay methods cannot be used interchangeably. PSA levels, regardless of value, should not be interpreted as absolute evidence of the presence or absence of disease.   02/05/2020 0.92 0.10 - 4.00 ng/ml Final    Comment:    Test performed using Access Hybritech PSA Assay, a parmagnetic partical, chemiluminecent immunoassay.         Passed - Last BP in normal range    BP Readings from Last 1 Encounters:  11/20/21 112/62         Passed - Valid encounter within last 12 months    Recent Outpatient Visits           3 days ago Stage 3 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 [facility])   [facility] [place], Grayland Ormond A, RPH-CPP   1 month ago Stage 3 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 [facility])   [facility] [place], Grayland Ormond A, RPH-CPP   5 months ago Aortic atherosclerosis [facility])   [facility] [place], Coralie Keens, NP   1 year ago Encounter for general adult medical examination with abnormal findings   [facility] [place], Coralie Keens, NP   1 year ago [facility] [place], Coralie Keens, NP       Future Appointments             In 4 days Garnette Gunner, Coralie Keens, NP [facility]             losartan (COZAAR) 50 MG tablet [Pharmacy Med Name: LOSARTAN 50MG  TABLETS] 45 tablet 0    Sig: TAKE 1/2 TABLET(25 MG) BY MOUTH DAILY     Cardiovascular:  Angiotensin Receptor Blockers Passed - 03/11/2022  3:32 AM      Passed - Cr in normal range and within 180 days    Creat  Date Value Ref Range Status  09/14/2021 0.75 0.70 - 1.35 mg/dL Final   Creatinine,U  Date Value Ref Range Status  11/03/2012 46.42 mg/dL Final    Comment:       Cutoff Values for Urine Drug Screen, Pain Mgmt          Drug Class           Cutoff (ng/mL)          Amphetamines             500          Barbiturates  200          Cocaine Metabolites      150          Benzodiazepines          200          Methadone                300          Opiates                  300          Phencyclidine             25          Propoxyphene             300          Marijuana Metabolites     50    For medical purposes only.         Passed - K in normal range and within 180 days    Potassium  Date Value Ref Range Status  09/14/2021 4.9 3.5 - 5.3 mmol/L Final         Passed - Patient is not pregnant      Passed - Last BP in normal range    BP Readings from Last 1 Encounters:  11/20/21 112/62         Passed - Valid encounter within last 6 months    Recent Outpatient Visits           3 days ago Stage 3 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 [facility])   [place], Grayland Ormond A, RPH-CPP   1 month ago Stage 3 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 [facility])   [place], Grayland Ormond A, RPH-CPP   5 months ago Aortic atherosclerosis [facility])   [facility] [place], Coralie Keens, NP   1 year ago Encounter for general adult medical examination  with abnormal findings   [facility] [place], Coralie Keens, NP   1 year ago [facility] [place], Coralie Keens, NP       Future Appointments             In 4 days [place], Coralie Keens, NP [facility], [facility]

## 2022-03-15 ENCOUNTER — Encounter: Payer: Self-pay | Admitting: Internal Medicine

## 2022-03-15 ENCOUNTER — Ambulatory Visit (INDEPENDENT_AMBULATORY_CARE_PROVIDER_SITE_OTHER): Payer: Medicare HMO | Admitting: Internal Medicine

## 2022-03-15 VITALS — BP 136/82 | HR 76 | Temp 97.7°F | Ht 68.0 in | Wt 118.0 lb

## 2022-03-15 DIAGNOSIS — Z5181 Encounter for therapeutic drug level monitoring: Secondary | ICD-10-CM

## 2022-03-15 DIAGNOSIS — Z23 Encounter for immunization: Secondary | ICD-10-CM

## 2022-03-15 DIAGNOSIS — Z0001 Encounter for general adult medical examination with abnormal findings: Secondary | ICD-10-CM | POA: Diagnosis not present

## 2022-03-15 DIAGNOSIS — E440 Moderate protein-calorie malnutrition: Secondary | ICD-10-CM | POA: Diagnosis not present

## 2022-03-15 DIAGNOSIS — Z125 Encounter for screening for malignant neoplasm of prostate: Secondary | ICD-10-CM | POA: Diagnosis not present

## 2022-03-15 DIAGNOSIS — E782 Mixed hyperlipidemia: Secondary | ICD-10-CM | POA: Diagnosis not present

## 2022-03-15 MED ORDER — ALPRAZOLAM 0.5 MG PO TABS: 0.5000 mg | ORAL_TABLET | Freq: Three times a day (TID) | ORAL | 0 refills | Status: DC

## 2022-03-15 NOTE — Patient Instructions (Signed)
Health Maintenance After Age 70 After age 70, you are at a higher risk for certain long-term diseases and infections as well as injuries from falls. Falls are a major cause of broken bones and head injuries in people who are older than age 70. Getting regular preventive care can help to keep you healthy and well. Preventive care includes getting regular testing and making lifestyle changes as recommended by your health care provider. Talk with your health care provider about: Which screenings and tests you should have. A screening is a test that checks for a disease when you have no symptoms. A diet and exercise plan that is right for you. What should I know about screenings and tests to prevent falls? Screening and testing are the best ways to find a health problem early. Early diagnosis and treatment give you the best chance of managing medical conditions that are common after age 70. Certain conditions and lifestyle choices may make you more likely to have a fall. Your health care provider may recommend: Regular vision checks. Poor vision and conditions such as cataracts can make you more likely to have a fall. If you wear glasses, make sure to get your prescription updated if your vision changes. Medicine review. Work with your health care provider to regularly review all of the medicines you are taking, including over-the-counter medicines. Ask your health care provider about any side effects that may make you more likely to have a fall. Tell your health care provider if any medicines that you take make you feel dizzy or sleepy. Strength and balance checks. Your health care provider may recommend certain tests to check your strength and balance while standing, walking, or changing positions. Foot health exam. Foot pain and numbness, as well as not wearing proper footwear, can make you more likely to have a fall. Screenings, including: Osteoporosis screening. Osteoporosis is a condition that causes  the bones to get weaker and break more easily. Blood pressure screening. Blood pressure changes and medicines to control blood pressure can make you feel dizzy. Depression screening. You may be more likely to have a fall if you have a fear of falling, feel depressed, or feel unable to do activities that you used to do. Alcohol use screening. Using too much alcohol can affect your balance and may make you more likely to have a fall. Follow these instructions at home: Lifestyle Do not drink alcohol if: Your health care provider tells you not to drink. If you drink alcohol: Limit how much you have to: 0-1 drink a day for women. 0-2 drinks a day for men. Know how much alcohol is in your drink. In the [place] one drink equals one 12 oz bottle of beer (355 mL), one 5 oz glass of wine (148 mL), or one 1 oz glass of hard liquor (44 mL). Do not use any products that contain nicotine or tobacco. These products include cigarettes, chewing tobacco, and vaping devices, such as e-cigarettes. If you need help quitting, ask your health care provider. Activity  Follow a regular exercise program to stay fit. This will help you maintain your balance. Ask your health care provider what types of exercise are appropriate for you. If you need a cane or walker, use it as recommended by your health care provider. Wear supportive shoes that have nonskid soles. Safety  Remove any tripping hazards, such as rugs, cords, and clutter. Install safety equipment such as grab bars in bathrooms and safety rails on stairs. Keep rooms and walkways   well-lit. General instructions Talk with your health care provider about your risks for falling. Tell your health care provider if: You fall. Be sure to tell your health care provider about all falls, even ones that seem minor. You feel dizzy, tiredness (fatigue), or off-balance. Take over-the-counter and prescription medicines only as told by your health care provider. These include  supplements. Eat a healthy diet and maintain a healthy weight. A healthy diet includes low-fat dairy products, low-fat (lean) meats, and fiber from whole grains, beans, and lots of fruits and vegetables. Stay current with your vaccines. Schedule regular health, dental, and eye exams. Summary Having a healthy lifestyle and getting preventive care can help to protect your health and wellness after age 70. Screening and testing are the best way to find a health problem early and help you avoid having a fall. Early diagnosis and treatment give you the best chance for managing medical conditions that are more common for people who are older than age 70. Falls are a major cause of broken bones and head injuries in people who are older than age 70. Take precautions to prevent a fall at home. Work with your health care provider to learn what changes you can make to improve your health and wellness and to prevent falls. This information is not intended to replace advice given to you by your health care provider. Make sure you discuss any questions you have with your health care provider. Document Revised: 06/23/2020 Document Reviewed: 06/23/2020 Elsevier Patient Education  2023 [employer].  

## 2022-03-15 NOTE — Progress Notes (Signed)
Subjective:    Patient ID: Elijah Mcfarland, male    DOB: 07/14/1950, 72 y.o.   MRN: [id number]  HPI  Patient presents to clinic today for his annual exam.  Flu: 02/2021 Tetanus: 08/2008 COVID: X 3 Pneumovax: 02/2018 Prevnar: 01/2020 Shingrix: Never PSA screening: 02/2021 Colon screening: 02/2020 Vision screening: as needed Dentist: as needed, dentures  Diet: He does eat meat. He consumes fruits and veggies. He does eat some fried foods. He drinks mostly coffee, boost Exercise: Walking  Review of Systems  Past Medical History:  Diagnosis Date   Chronic pain syndrome    Left knee   COPD (chronic obstructive pulmonary disease) (HCC)    DDD (degenerative disc disease), lumbar 11/2008   L5-S1 on plain film   Emphysema lung (HCC)    GAD (generalized anxiety disorder)    GERD (gastroesophageal reflux disease)    History of multiple pulmonary nodules 07/2010   Follow up CT scans showed resolution by 03/2011--NO MALIGNANCY.   History of pneumonia 06/2010   HTN (hypertension) 01/07/2014   Osteoarthritis of left knee    + past injury of this knee--tibia fracture?  +left leg shorter than right   Seizure (HCC)    Seizure disorder (HCC)    3 total seizures (first was 1995); neuro eval done and recommended lifetime phenytoin (he had a seizure after coming off the med in the late 90s   Tobacco dependence     Current Outpatient Medications  Medication Sig Dispense Refill   acetaminophen (TYLENOL) 325 MG tablet Take 2 tablets (650 mg total) by mouth every 6 (six) hours as needed for mild pain or headache (or Fever >/= 101). 30 tablet 2   albuterol (VENTOLIN HFA) 108 (90 Base) MCG/ACT inhaler INHALE 2 PUFFS INTO THE LUNGS EVERY 6 HOURS AS NEEDED FOR WHEEZING OR SHORTNESS OF BREATH 20.1 g 0   ALPRAZolam (XANAX) 0.5 MG tablet TAKE 1 TABLET BY MOUTH THREE TIMES DAILY 90 tablet 0   atorvastatin (LIPITOR) 20 MG tablet TAKE 1 TABLET EVERY DAY 90 tablet 0   Budeson-Glycopyrrol-Formoterol (BREZTRI  AEROSPHERE) 160-9-4.8 MCG/ACT AERO Inhale 2 puffs into the lungs in the morning and at bedtime. 32.1 g 3   citalopram (CELEXA) 40 MG tablet TAKE 1 TABLET EVERY DAY 90 tablet 0   levocetirizine (XYZAL) 5 MG tablet Take 5 mg by mouth every evening.     losartan (COZAAR) 50 MG tablet TAKE 1/2 TABLET(25 MG) BY MOUTH DAILY 45 tablet 0   Nutritional Supplements (NUTRITIONAL SUPPLEMENT PO) Take by mouth. Super Beta Prostate Advanced     OXYGEN Inhale into the lungs at bedtime.     phenytoin (DILANTIN) 100 MG ER capsule TAKE 3 CAPSULES EVERY DAY 270 capsule 10   roflumilast (DALIRESP) 500 MCG TABS tablet Take 1 tablet (500 mcg total) by mouth daily. 30 tablet 6   tamsulosin (FLOMAX) 0.4 MG CAPS capsule TAKE 1 CAPSULE(0.4 MG) BY MOUTH DAILY 90 capsule 0   theophylline (THEODUR) 100 MG 12 hr tablet Take 1 tablet (100 mg total) by mouth 2 (two) times daily. 60 tablet 2   No current facility-administered medications for this visit.    Allergies  Allergen Reactions   Nsaids Other (See Comments)    GI bleed and vomiting    Family History  Problem Relation Age of Onset   Heart disease Mother    Heart disease Father    Cancer Sister    Stroke Neg Hx     Social History   Socioeconomic  History   Marital status: Married    Spouse name: Not on file   Number of children: Not on file   Years of education: Not on file   Highest education level: Not on file  Occupational History   Not on file  Tobacco Use   Smoking status: Every Day    Packs/day: 3.00    Years: 51.00    Total pack years: 153.00    Types: Cigarettes   Smokeless tobacco: Never   Tobacco comments:    8-10 daily--08/13/2021    8-10 daily 11/20/2021  Vaping Use   Vaping Use: Never used  Substance and Sexual Activity   Alcohol use: No    Alcohol/week: 0.0 standard drinks of alcohol   Drug use: Not Currently    Types: Marijuana    Comment: 2 month   Sexual activity: Yes    Partners: Male  Other Topics Concern   Not on file   Social History Narrative   Married, 2 grown children   Eighth grade education.  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   Originally from [place].   Tobacco 100 pack-yr hx, still smoking as of 10/2011 (1 pack per day).   Denies alcohol or drug use.   No exercise.   Social Determinants of Health   Financial Resource Strain: Low Risk  (01/01/2022)   Overall Financial Resource Strain (CARDIA)    Difficulty of Paying Living Expenses: Not hard at all  Food Insecurity: No Food Insecurity (01/01/2022)   Hunger Vital Sign    Worried About Running Out of Food in the Last Year: Never true    Ran Out of Food in the Last Year: Never true  Transportation Needs: No Transportation Needs (01/01/2022)   PRAPARE - Hydrologist (Medical): No    Lack of Transportation (Non-Medical): No  Physical Activity: Sufficiently Active (01/01/2022)   Exercise Vital Sign    Days of Exercise per Week: 5 days    Minutes of Exercise per Session: 30 min  Stress: No Stress Concern Present (01/01/2022)   [place]    Feeling of Stress : Not at all  Social Connections: Moderately Isolated (01/01/2022)   Social Connection and Isolation Panel [NHANES]    Frequency of Communication with Friends and Family: More than three times a week    Frequency of Social Gatherings with Friends and Family: More than three times a week    Attends Religious Services: Never    Marine scientist or Organizations: No    Attends Archivist Meetings: Never    Marital Status: Married  Human resources officer Violence: Not At Risk (01/01/2022)   Humiliation, Afraid, Rape, and Kick questionnaire    Fear of Current or Ex-Partner: No    Emotionally Abused: No    Physically Abused: No    Sexually Abused: No     Constitutional: Denies fever, malaise, fatigue, headache or abrupt weight changes.  HEENT: Patient  reports difficulty with vision.  Denies eye pain, eye redness, ear pain, ringing in the ears, wax buildup, runny nose, nasal congestion, bloody nose, or sore throat. Respiratory: Denies difficulty breathing, shortness of breath, cough or sputum production.   Cardiovascular: Denies chest pain, chest tightness, palpitations or swelling in the hands or feet.  Gastrointestinal: Denies abdominal pain, bloating, constipation, diarrhea or blood in the stool.  GU: Pt reports nocturia. Denies urgency, frequency, pain with urination, burning sensation, blood  in urine, odor or discharge. Musculoskeletal: Patient reports joint pain.  Denies decrease in range of motion, difficulty with gait, muscle pain or joint swelling.  Skin: Denies redness, rashes, lesions or ulcercations.  Neurological: Denies dizziness, difficulty with memory, difficulty with speech or problems with balance and coordination.  Psych: Patient has a history of anxiety.  Denies depression, SI/HI.  No other specific complaints in a complete review of systems (except as listed in HPI above).     Objective:   Physical Exam BP 136/82 (BP Location: Left Arm, Patient Position: Sitting, Cuff Size: Normal)   Pulse 76   Temp 97.7 F (36.5 C) (Temporal)   Ht 5\' 8"  (1.727 m)   Wt 118 lb (53.5 kg)   SpO2 99%   BMI 17.94 kg/m   Wt Readings from Last 3 Encounters:  01/01/22 124 lb (56.2 kg)  11/20/21 124 lb 3.2 oz (56.3 kg)  09/14/21 119 lb (54 kg)    General: Appears his stated age, underweight, in NAD. Skin: Warm, dry and intact.  HEENT: Head: normal shape and size; Eyes: sclera white, no icterus, conjunctiva pink, PERRLA and EOMs intact;  Neck:  Neck supple, trachea midline. No masses, lumps or thyromegaly present.  Cardiovascular: Normal rate and rhythm. S1,S2 noted.  No murmur, rubs or gallops noted. No JVD or BLE edema. No carotid bruits noted. Pulmonary/Chest: Normal effort and positive vesicular breath sounds. No respiratory  distress. No wheezes, rales or ronchi noted.  Abdomen: Soft and nontender. Normal bowel sounds.  Musculoskeletal: Muscle wasting noted. Strength 5/5 BUE/BLE. No difficulty with gait.  Neurological: Alert and oriented. Cranial nerves II-XII grossly intact. Coordination normal.  Psychiatric: Mood and affect normal. Behavior is normal. Judgment and thought content normal.     BMET    Component Value Date/Time   NA 140 09/14/2021 1333   K 4.9 09/14/2021 1333   CL 105 09/14/2021 1333   CO2 32 09/14/2021 1333   GLUCOSE 104 (H) 09/14/2021 1333   BUN 8 09/14/2021 1333   CREATININE 0.75 09/14/2021 1333   CALCIUM 8.7 09/14/2021 1333   GFRNONAA >60 03/10/2018 0506   GFRAA >60 03/10/2018 0506    Lipid Panel     Component Value Date/Time   CHOL 148 09/14/2021 1333   TRIG 130 09/14/2021 1333   HDL 55 09/14/2021 1333   CHOLHDL 2.7 09/14/2021 1333   VLDL 26.0 02/05/2020 1252   LDLCALC 72 09/14/2021 1333    CBC    Component Value Date/Time   WBC 6.9 09/14/2021 1333   RBC 3.92 (L) 09/14/2021 1333   HGB 12.1 (L) 09/14/2021 1333   HCT 36.1 (L) 09/14/2021 1333   PLT 191 09/14/2021 1333   MCV 92.1 09/14/2021 1333   MCH 30.9 09/14/2021 1333   MCHC 33.5 09/14/2021 1333   RDW 13.0 09/14/2021 1333   LYMPHSABS 1.0 03/06/2018 1235   MONOABS 1.0 03/06/2018 1235   EOSABS 0.3 03/06/2018 1235   BASOSABS 0.0 03/06/2018 1235    Hgb A1C Lab Results  Component Value Date   HGBA1C 5.1 09/14/2021           Assessment & Plan:   Preventative Health Maintenance:  Flu shot today He declines tetanus for financial reasons, advised him if he gets better To go get this done Pneumovax and Prevnar UTD Encouraged him to get his COVID booster Discussed Shingrix vaccine, he will check coverage with his insurance company Colon screening UTD Encouraged him to consume a balanced diet and exercise regimen Advised him  to see an eye doctor and dentist annually We will check CBC, c-Met, lipid, PSA  and Dilantin level today  RTC in 6 months, follow-up chronic conditions Webb Silversmith, NP

## 2022-03-15 NOTE — Assessment & Plan Note (Signed)
Discussed changing Citalopram to Mirtazapine- he would like to hold off at this time Consider adding Megace

## 2022-03-16 LAB — COMPLETE METABOLIC PANEL WITH GFR
AG Ratio: 1.6 (calc) (ref 1.0–2.5)
ALT: 22 U/L (ref 9–46)
AST: 20 U/L (ref 10–35)
Albumin: 4.1 g/dL (ref 3.6–5.1)
Alkaline phosphatase (APISO): 58 U/L (ref 35–144)
BUN/Creatinine Ratio: 15 (calc) (ref 6–22)
BUN: 10 mg/dL (ref 7–25)
CO2: 29 mmol/L (ref 20–32)
Calcium: 9.1 mg/dL (ref 8.6–10.3)
Chloride: 103 mmol/L (ref 98–110)
Creat: 0.67 mg/dL — ABNORMAL LOW (ref 0.70–1.35)
Globulin: 2.5 g/dL (calc) (ref 1.9–3.7)
Glucose, Bld: 65 mg/dL (ref 65–99)
Potassium: 4.6 mmol/L (ref 3.5–5.3)
Sodium: 139 mmol/L (ref 135–146)
Total Bilirubin: 0.3 mg/dL (ref 0.2–1.2)
Total Protein: 6.6 g/dL (ref 6.1–8.1)
eGFR: 101 mL/min/{1.73_m2} (ref >=60)

## 2022-03-16 LAB — LIPID PANEL
Cholesterol: 178 mg/dL (ref <200)
HDL: 64 mg/dL (ref >=40)
LDL Cholesterol (Calc): 97 mg/dL (calc)
Non-HDL Cholesterol (Calc): 114 mg/dL (calc) (ref <130)
Total CHOL/HDL Ratio: 2.8 (calc) (ref <5.0)
Triglycerides: 82 mg/dL (ref <150)

## 2022-03-16 LAB — CBC
HCT: 36.6 % — ABNORMAL LOW (ref 38.5–50.0)
Hemoglobin: 12.6 g/dL — ABNORMAL LOW (ref 13.2–17.1)
MCH: 31.6 pg (ref 27.0–33.0)
MCHC: 34.4 g/dL (ref 32.0–36.0)
MCV: 91.7 fL (ref 80.0–100.0)
MPV: 11.2 fL (ref 7.5–12.5)
Platelets: 215 10*3/uL (ref 140–400)
RBC: 3.99 10*6/uL — ABNORMAL LOW (ref 4.20–5.80)
RDW: 13.1 % (ref 11.0–15.0)
WBC: 7.3 10*3/uL (ref 3.8–10.8)

## 2022-03-16 LAB — PSA: PSA: 0.95 ng/mL (ref <=4.00)

## 2022-03-16 LAB — PHENYTOIN LEVEL, FREE AND TOTAL: PHENYTOIN, FREE: 0.7 mg/L — ABNORMAL LOW (ref 1.0–2.0)

## 2022-03-18 ENCOUNTER — Ambulatory Visit: Payer: Medicare HMO | Admitting: Pulmonary Disease

## 2022-03-18 ENCOUNTER — Encounter: Payer: Self-pay | Admitting: Pulmonary Disease

## 2022-03-18 VITALS — BP 110/60 | HR 74 | Temp 97.7°F | Ht 68.0 in | Wt 121.8 lb

## 2022-03-18 DIAGNOSIS — G4736 Sleep related hypoventilation in conditions classified elsewhere: Secondary | ICD-10-CM

## 2022-03-18 DIAGNOSIS — J449 Chronic obstructive pulmonary disease, unspecified: Secondary | ICD-10-CM | POA: Diagnosis not present

## 2022-03-18 DIAGNOSIS — R0602 Shortness of breath: Secondary | ICD-10-CM

## 2022-03-18 DIAGNOSIS — J439 Emphysema, unspecified: Secondary | ICD-10-CM

## 2022-03-18 DIAGNOSIS — I5189 Other ill-defined heart diseases: Secondary | ICD-10-CM

## 2022-03-18 DIAGNOSIS — R64 Cachexia: Secondary | ICD-10-CM

## 2022-03-18 DIAGNOSIS — F17210 Nicotine dependence, cigarettes, uncomplicated: Secondary | ICD-10-CM | POA: Diagnosis not present

## 2022-03-18 MED ORDER — ALBUTEROL SULFATE (2.5 MG/3ML) 0.083% IN NEBU
2.5000 mg | INHALATION_SOLUTION | Freq: Once | RESPIRATORY_TRACT | Status: AC
  Administered 2022-03-18: 2.5 mg via RESPIRATORY_TRACT

## 2022-03-18 MED ORDER — ALBUTEROL SULFATE (2.5 MG/3ML) 0.083% IN NEBU: 2.5000 mg | INHALATION_SOLUTION | Freq: Four times a day (QID) | RESPIRATORY_TRACT | 12 refills | Status: DC | PRN

## 2022-03-18 NOTE — Patient Instructions (Signed)
Please make every effort possible to quit smoking.  We have sent in a prescription for a nebulizer machine.  We will send also a medication for the nebulizer you can use this medication up to 3 to 4 times a day as needed.  It is albuterol.  You qualified for oxygen supplementation.  We have sent a request to [employer] to provide you with a portable source of oxygen so you can use it when you go out and are up and about.  If you are doing quiet activity like reading or watching TV you can be off of the oxygen.  Continue wearing your oxygen at nighttime.  We have sent a referral to the cardiologist to evaluate your heart function.  We will see on follow-up in 3 months time call sooner should any new problems arise.

## 2022-03-18 NOTE — Progress Notes (Signed)
Subjective:    Patient ID: Elijah Mcfarland, male    DOB: 05/17/1950, 72 y.o.   MRN: [id number] Patient Care Team: [name], NP as PCP - General (Internal Medicine) Curley Spice Virl Diamond, RPH-CPP as Pharmacist Tyler Pita, MD as Consulting Physician (Pulmonary Disease)  Chief Complaint  Patient presents with   Follow-up    No SOB. Wheezing. Cough with white sputum.     HPI Elijah Mcfarland is a 72 year old current smoker (8 to 10 cigarettes/day) who presents for follow-up on the issue of COPD Gold class III.  This is a scheduled visit.  He was last seen here on 20 November 2021 at that time he had a mild exacerbation and was treated with azithromycin and Medrol Dosepak.  We ordered some theophylline for him as well however he could not get this medication.  He had previously been unable to get Daliresp due to cost.  His options with regards to respiratory medications are limited.  A 2D echo was also ordered at that time shows that he has grade 2 diastolic dysfunction and mitral regurgitation.  Today he presents stating that he is doing well with Breztri twice a day.  He continues to smoke and is down to 8 to 10 cigarettes a day.  He still has significant sputum production during the day whitish to gray.  This is a chronic issue. He has not had any fevers, chills or sweats.  No hemoptysis.  He has chronic dyspnea class II-III.  He does not endorse any other symptomatology.  He has noted increased wheezing over the last 2 to 3 days.  No change in quality and character of cough and sputum production.  No hemoptysis.  He is compliant with nocturnal oxygen 2 L/min.   DATA 03/28/2019 echocardiogram: LVEF 55 to 60%, no regional wall motion abnormality, grade 1 DD, normal right ventricular systolic function.  No valvular abnormalities 03/29/2019 PFTs: FEV1 1.17 L or 40% predicted, FVC 2.68 L or 68% predicted, FEV1/FVC 44%, there is bronchodilator response with regards to air trapping.  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  FEV1 1.23 L or 43% predicted, FVC 2.90 L or 76% predicted, FEV1/FVC 42%, there is bronchodilator response with regards to air trapping.  There is air trapping without hyperinflation, small airways component.  Diffusion capacity severely reduced as prior.  Overall no significant change.  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   Stage 3 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 ([place]) 04/25/2006   Arthritis 04/25/2006   Social History   Tobacco Use   Smoking status: Every Day    Packs/day: 3.00    Years: 51.00    Total pack years: 153.00  Types: Cigarettes   Smokeless tobacco: Never   Tobacco comments:    8-10 daily--03/18/2022  Substance Use Topics   Alcohol use: No    Alcohol/week: 0.0 standard drinks of alcohol   Allergies  Allergen Reactions   Nsaids Other (See Comments)    GI bleed and vomiting   Current Meds  Medication Sig   acetaminophen (TYLENOL) 325 MG tablet Take 2 tablets (650 mg total) by mouth every 6 (six) hours as needed for mild pain or headache (or Fever >/=  101).   albuterol (VENTOLIN HFA) 108 (90 Base) MCG/ACT inhaler INHALE 2 PUFFS INTO THE LUNGS EVERY 6 HOURS AS NEEDED FOR WHEEZING OR SHORTNESS OF BREATH   ALPRAZolam (XANAX) 0.5 MG tablet Take 1 tablet (0.5 mg total) by mouth 3 (three) times daily.   atorvastatin (LIPITOR) 20 MG tablet TAKE 1 TABLET EVERY DAY   Budeson-Glycopyrrol-Formoterol (BREZTRI AEROSPHERE) 160-9-4.8 MCG/ACT AERO Inhale 2 puffs into the lungs in the morning and at bedtime.   citalopram (CELEXA) 40 MG tablet TAKE 1 TABLET EVERY DAY   Ferrous Sulfate (IRON) 325 (65 Fe) MG TABS Take 1 tablet by mouth in the morning and at bedtime.   levocetirizine (XYZAL) 5 MG tablet Take 5 mg by mouth every evening.   losartan (COZAAR) 50 MG tablet TAKE 1/2 TABLET(25 MG) BY MOUTH DAILY   Nutritional Supplements (NUTRITIONAL SUPPLEMENT PO) Take by mouth. Super Beta Prostate Advanced   OXYGEN Inhale into the lungs at bedtime.   phenytoin (DILANTIN) 100 MG ER capsule TAKE 3 CAPSULES EVERY DAY   tamsulosin (FLOMAX) 0.4 MG CAPS capsule TAKE 1 CAPSULE(0.4 MG) BY MOUTH DAILY   Immunization History  Administered Date(s) Administered   Fluad Quad(high Dose 65+) 11/17/2018, 02/05/2020, 03/10/2021, 03/15/2022   H1N1 01/05/2008   Influenza Whole 11/07/2007   Influenza, High Dose Seasonal PF 03/03/2018   Influenza, Seasonal, Injecte, Preservative Fre 04/13/2013   Influenza,inj,Quad PF,6+ Mos 11/14/2014, 02/19/2016, 02/18/2017   Moderna SARS-COV2 Booster Vaccination 12/26/2019   Moderna Sars-Covid-2 Vaccination 04/22/2019, 05/20/2019   Pneumococcal Conjugate-13 11/14/2014, 02/05/2020   Pneumococcal Polysaccharide-23 11/07/2007, 02/19/2016, 03/03/2018   Td 09/06/2008       Objective:   Physical Exam BP 110/60 (BP Location: Left Arm, Cuff Size: Normal)   Pulse 74   Temp 97.7 F (36.5 C)   Ht 5\' 8"  (1.727 m)   Wt 121 lb 12.8 oz (55.2 kg)   SpO2 98%   BMI 18.52 kg/m   SpO2: 98 % O2 Device: None (Room air)  GENERAL: Thin, well-developed  male, no overt respiratory distress, fully ambulatory. HEAD: Normocephalic, atraumatic. EYES: Pupils equal, round, reactive to light.  No scleral icterus. MOUTH: Nose/mouth/throat not examined due to masking requirements for COVID 19. NECK: Supple. No thyromegaly. No nodules. No JVD.  Trachea midline PULMONARY: Symmetrical air entry, mild accessory muscle use noted today, diffuse wheezes noted, no other adventitious sounds, breath sounds are coarse.  Positive Hoover sign. CARDIOVASCULAR: S1 and S2. Regular rate and rhythm.  No overt murmurs, gallops or rubs noted. GASTROINTESTINAL: No distention.  Benign. MUSCULOSKELETAL: No joint deformity, no clubbing, no edema. NEUROLOGIC: No focal deficits, fluent speech, awake, alert. SKIN: Intact,warm,dry.  No rashes noted on limited exam. PSYCH: Normal mood and behavior  Ambulatory oximetry: Ambulatory oximetry showed a resting heart rate of 85 bpm, resting O2 sat of 97%.  During ambulation patient dropped to 87% after 500 feet of ambulation.  Patient was then supplemented with 2 L/min which maintain oxygen saturations above 90% with ambulation.  Patient received albuterol via nebulizer x 1: There was improvement on bronchospasm and shortness of breath.  Auscultation of the lungs after nebulization treatment showed improved air entry bilaterally.    Assessment & Plan:     ICD-10-CM   1. Stage 3 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 ([place])  J44.9 albuterol (PROVENTIL) (2.5 MG/3ML) 0.083% nebulizer solution 2.5 mg    AMB REFERRAL FOR DME    AMB REFERRAL FOR DME   Continue Breztri 2 puffs twice a day Continue as needed albuterol Provided with nebulizer and albuterol solution    2. Shortness of breath  R06.02 albuterol (PROVENTIL) (2.5 MG/3ML) 0.083% nebulizer solution 2.5 mg    Ambulatory referral to Cardiology   Multifactorial: Decompensated COPD Modest cardiac component suspected    3. Diastolic dysfunction  X54.00 Ambulatory referral to Cardiology    Referral to cardiology for evaluation May add to his shortness of breath    4. 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 Tobacco dependence due to cigarettes  F17.210    Patient counseled regards to discontinuation of smoking Counseling time 3 to 5 minutes     Orders Placed This Encounter  Procedures   AMB REFERRAL FOR DME    Referral Priority:   Routine    Referral Type:   Durable Medical Equipment Purchase    Number of Visits Requested:   1   AMB REFERRAL FOR DME    Referral Priority:   Routine    Referral Type:   Durable Medical Equipment Purchase    Number of Visits Requested:   1   Ambulatory referral to Cardiology    Referral Priority:   Routine    Referral Type:   Consultation    Referral Reason:   Specialty Services Required    Requested Specialty:   Cardiology    Number of Visits Requested:   1   Meds ordered this encounter  Medications   albuterol (PROVENTIL) (2.5 MG/3ML) 0.083% nebulizer solution 2.5 mg   albuterol (PROVENTIL) (2.5 MG/3ML) 0.083% nebulizer solution    Sig: Take 3 mLs (2.5 mg total) by nebulization every 6 (six) hours as needed for wheezing or shortness of breath.    Dispense:  75 mL    Refill:  12   Patient was counseled regards to discontinuation of smoking.  He did qualify for oxygen today.  A request will be sent to his DME company [employer] to provide him with a portable source of oxygen.  He has been referred to cardiology for evaluation of his diastolic dysfunction and mitral regurg.    His options are limited due to severity of disease and ongoing tobacco use.  We will see him in follow-up in a month's time he  is to contact us prior to that time should any new difficulties arise.  Renold Don, MD Advanced Bronchoscopy PCCM [facility]    *This note was dictated using voice recognition software/Dragon.  Despite best efforts to proofread, errors can occur which can change the meaning. Any transcriptional errors that result from this process are unintentional and may not be  fully corrected at the time of dictation.

## 2022-03-20 ENCOUNTER — Encounter: Payer: Self-pay | Admitting: Pulmonary Disease

## 2022-03-23 ENCOUNTER — Ambulatory Visit
Admission: RE | Admit: 2022-03-23 | Discharge: 2022-03-23 | Disposition: A | Discharge status: Home / Self Care | Payer: Medicare HMO | Source: Ambulatory Visit | Attending: Internal Medicine | Admitting: Internal Medicine

## 2022-03-23 DIAGNOSIS — Z87891 Personal history of nicotine dependence: Secondary | ICD-10-CM

## 2022-03-23 DIAGNOSIS — F17210 Nicotine dependence, cigarettes, uncomplicated: Secondary | ICD-10-CM | POA: Diagnosis not present

## 2022-03-25 ENCOUNTER — Other Ambulatory Visit: Payer: Self-pay | Admitting: Acute Care

## 2022-03-25 DIAGNOSIS — F17210 Nicotine dependence, cigarettes, uncomplicated: Secondary | ICD-10-CM

## 2022-03-25 DIAGNOSIS — Z87891 Personal history of nicotine dependence: Secondary | ICD-10-CM

## 2022-03-25 DIAGNOSIS — Z122 Encounter for screening for malignant neoplasm of respiratory organs: Secondary | ICD-10-CM

## 2022-04-16 ENCOUNTER — Other Ambulatory Visit: Payer: Self-pay | Admitting: Internal Medicine

## 2022-04-16 NOTE — Telephone Encounter (Signed)
Requested medication (s) are due for refill today - yes  Requested medication (s) are on the active medication list -yes  Future visit scheduled -no  Last refill: 03/15/22 #90  Notes to clinic: non delegated Rx  Requested Prescriptions  Pending Prescriptions Disp Refills   ALPRAZolam (XANAX) 0.5 MG tablet [Pharmacy Med Name: ALPRAZOLAM 0.'5MG'$  TABLETS] 90 tablet     Sig: TAKE 1 TABLET(0.5 MG) BY MOUTH THREE TIMES DAILY     Not Delegated - Psychiatry: Anxiolytics/Hypnotics 2 Failed - 04/16/2022  9:03 AM      Failed - This refill cannot be delegated      Failed - Urine Drug Screen completed in last 360 days      Passed - Patient is not pregnant      Passed - Valid encounter within last 6 months    Recent Outpatient Visits           1 month ago Encounter for annual general medical examination with abnormal findings in adult   [facility] [place], Coralie Keens, NP   1 month ago Stage 3 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 [facility])   [place], Grayland Ormond A, RPH-CPP   3 months ago Stage 3 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 [facility])   [place], Grayland Ormond A, RPH-CPP   7 months ago Aortic atherosclerosis [facility])   [facility] [place], Coralie Keens, NP   1 year ago Encounter for general adult medical examination with abnormal findings   [facility] [place], Coralie Keens, NP       Future Appointments             In 2 weeks Garen Lah, Aaron Edelman, MD [place] [facility]               Requested Prescriptions  Pending Prescriptions Disp Refills   ALPRAZolam (XANAX) 0.5 MG tablet [Pharmacy Med Name: ALPRAZOLAM 0.'5MG'$  TABLETS] 90 tablet     Sig: TAKE 1 TABLET(0.5 MG) BY MOUTH THREE TIMES DAILY     Not Delegated - Psychiatry: Anxiolytics/Hypnotics 2 Failed - 04/16/2022  9:03 AM      Failed - This refill cannot be delegated      Failed -  Urine Drug Screen completed in last 360 days      Passed - Patient is not pregnant      Passed - Valid encounter within last 6 months    Recent Outpatient Visits           1 month ago Encounter for annual general medical examination with abnormal findings in adult   [place], Coralie Keens, NP   1 month ago Stage 3 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 [facility])   [place], Grayland Ormond A, RPH-CPP   3 months ago Stage 3 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 [facility])   [place], Grayland Ormond A, RPH-CPP   7 months ago Aortic atherosclerosis [facility])   [facility] [place], Coralie Keens, NP   1 year ago Encounter for general adult medical examination with abnormal findings   [facility] [place], Coralie Keens, NP       Future Appointments             In 2 weeks [place], Aaron Edelman, MD [place] [facility]

## 2022-05-05 ENCOUNTER — Other Ambulatory Visit
Admission: RE | Admit: 2022-05-05 | Discharge: 2022-05-05 | Disposition: A | Discharge status: Home / Self Care | Payer: Medicare HMO | Source: Ambulatory Visit | Attending: Cardiology | Admitting: Cardiology

## 2022-05-05 ENCOUNTER — Encounter: Payer: Self-pay | Admitting: Cardiology

## 2022-05-05 ENCOUNTER — Ambulatory Visit: Payer: Medicare HMO | Attending: Cardiology | Admitting: Cardiology

## 2022-05-05 VITALS — BP 112/78 | HR 62 | Ht 68.0 in | Wt 116.4 lb

## 2022-05-05 DIAGNOSIS — I5030 Unspecified diastolic (congestive) heart failure: Secondary | ICD-10-CM | POA: Diagnosis not present

## 2022-05-05 DIAGNOSIS — F17200 Nicotine dependence, unspecified, uncomplicated: Secondary | ICD-10-CM | POA: Diagnosis not present

## 2022-05-05 DIAGNOSIS — R072 Precordial pain: Secondary | ICD-10-CM

## 2022-05-05 DIAGNOSIS — I2510 Atherosclerotic heart disease of native coronary artery without angina pectoris: Secondary | ICD-10-CM | POA: Insufficient documentation

## 2022-05-05 DIAGNOSIS — IMO0001 Reserved for inherently not codable concepts without codable children: Secondary | ICD-10-CM

## 2022-05-05 DIAGNOSIS — I10 Essential (primary) hypertension: Secondary | ICD-10-CM

## 2022-05-05 LAB — BASIC METABOLIC PANEL
Anion gap: 9 (ref 5–15)
BUN: 9 mg/dL (ref 8–23)
CO2: 27 mmol/L (ref 22–32)
Calcium: 9 mg/dL (ref 8.9–10.3)
Chloride: 103 mmol/L (ref 98–111)
Creatinine, Ser: 0.71 mg/dL (ref 0.61–1.24)
GFR, Estimated: >60 mL/min (ref >60)
Glucose, Bld: 98 mg/dL (ref 70–99)
Potassium: 3.8 mmol/L (ref 3.5–5.1)
Sodium: 139 mmol/L (ref 135–145)

## 2022-05-05 MED ORDER — METOPROLOL TARTRATE 50 MG PO TABS: 50.0000 mg | ORAL_TABLET | Freq: Once | ORAL | Status: DC

## 2022-05-05 MED ORDER — FUROSEMIDE 20 MG PO TABS: 20.0000 mg | ORAL_TABLET | Freq: Every day | ORAL | 0 refills | Status: DC | PRN

## 2022-05-05 MED ORDER — ASPIRIN 81 MG PO TBEC: 81.0000 mg | DELAYED_RELEASE_TABLET | Freq: Every day | ORAL | 3 refills | Status: AC

## 2022-05-05 NOTE — Progress Notes (Signed)
Cardiology Office Note:    Date:  05/05/2022   ID:  Lorimer Takashima, DOB [birth year], MRN [id number]  PCP:  [name], NP   [facility] Cardiologist:  Kate Sable, MD     Referring MD: [name], NP   Chief Complaint  Patient presents with   New Patient (Initial Visit)    SOB, Diastolic dysfunction, history of chest/arm pain, swelling legs and feet     History of Present Illness:    Elijah Mcfarland is a 72 y.o. male with a hx of hypertension, hyperlipidemia, current smoker x 50+ years, COPD on 2 L oxygen who presented due to shortness of breath and diastolic dysfunction.  Endorse having shortness of breath with exertion ongoing for several years now, attributed to COPD.  Recently saw pulmonary medicine, echocardiogram was ordered showing grade 2 diastolic dysfunction.  Patient endorsed having occasional chest pain and pressures, describes discomfort as a tightness relieved with aspirin and albuterol.  He still smokes.  Endorses having occasional leg edema.   Echocardiogram 01/2022 EF 55 to [id number], grade 2 diastolic dysfunction. Chest CT lung cancer screening 2/24 LAD and RCA calcifications.   Past Medical History:  Diagnosis Date   Chronic pain syndrome    Left knee   COPD (chronic obstructive pulmonary disease) (HCC)    DDD (degenerative disc disease), lumbar 11/2008   L5-S1 on plain film   Emphysema lung (HCC)    GAD (generalized anxiety disorder)    GERD (gastroesophageal reflux disease)    History of multiple pulmonary nodules 07/2010   Follow up CT scans showed resolution by 03/2011--NO MALIGNANCY.   History of pneumonia 06/2010   HTN (hypertension) 01/07/2014   Osteoarthritis of left knee    + past injury of this knee--tibia fracture?  +left leg shorter than right   Seizure (HCC)    Seizure disorder ([place])    3 total seizures (first was 1995); neuro eval done and recommended lifetime phenytoin (he had a seizure after coming off the med  in the late 90s   Tobacco dependence     Past Surgical History:  Procedure Laterality Date   COLONOSCOPY  2010   At [facility]; normal per pt   COLONOSCOPY WITH PROPOFOL N/A 09/28/2017   Procedure: COLONOSCOPY WITH PROPOFOL;  Surgeon: [name], MD;  Location: [facility] ENDOSCOPY;  Service: Gastroenterology;  Laterality: N/A;   COLONOSCOPY WITH PROPOFOL N/A 10/13/2017   Procedure: COLONOSCOPY WITH PROPOFOL;  Surgeon: Lin Landsman, MD;  Location: [facility] ENDOSCOPY;  Service: Gastroenterology;  Laterality: N/A;   COLONOSCOPY WITH PROPOFOL N/A 02/27/2020   Procedure: COLONOSCOPY WITH PROPOFOL;  Surgeon: Lin Landsman, MD;  Location: [facility] ENDOSCOPY;  Service: Gastroenterology;  Laterality: N/A;   ESOPHAGOGASTRODUODENOSCOPY N/A 03/01/2018   Procedure: ESOPHAGOGASTRODUODENOSCOPY (EGD);  Surgeon: Rogene Houston, MD;  Location[facility] ENDO [facility];  Service: Endoscopy;  Laterality: N/A;   ESOPHAGOGASTRODUODENOSCOPY N/A 03/06/2018   Procedure: ESOPHAGOGASTRODUODENOSCOPY (EGD);  Surgeon: Rogene Houston, MD;  Location: [facility] ENDO SUITE;  Service: Endoscopy;  Laterality: N/A;   ESOPHAGOGASTRODUODENOSCOPY N/A 03/09/2018   Procedure: ESOPHAGOGASTRODUODENOSCOPY (EGD);  Surgeon: Rogene Houston, MD;  Location[facility] ENDO [facility];  Service: Endoscopy;  Laterality: N/A;   GIVENS CAPSULE STUDY N/A 03/08/2018   Procedure: GIVENS CAPSULE STUDY;  Surgeon: Rogene Houston, MD;  Location[facility] ENDO [facility];  Service: Endoscopy;  Laterality: N/A;   KNEE SURGERY     Left : x 2    Current Medications: Current Meds  Medication Sig  acetaminophen (TYLENOL) 325 MG tablet Take 2 tablets (650 mg total) by mouth every 6 (six) hours as needed for mild pain or headache (or Fever >/= 101).   albuterol (PROVENTIL) (2.5 MG/3ML) 0.083% nebulizer solution Take 3 mLs (2.5 mg total) by nebulization every 6 (six) hours as needed for wheezing or shortness of breath.   albuterol (VENTOLIN HFA) 108 (90 Base) MCG/ACT inhaler INHALE 2 PUFFS INTO THE  LUNGS EVERY 6 HOURS AS NEEDED FOR WHEEZING OR SHORTNESS OF BREATH   ALPRAZolam (XANAX) 0.5 MG tablet TAKE 1 TABLET(0.5 MG) BY MOUTH THREE TIMES DAILY   aspirin EC 81 MG tablet Take 1 tablet (81 mg total) by mouth daily. Swallow whole.   atorvastatin (LIPITOR) 20 MG tablet TAKE 1 TABLET EVERY DAY   Budeson-Glycopyrrol-Formoterol (BREZTRI AEROSPHERE) 160-9-4.8 MCG/ACT AERO Inhale 2 puffs into the lungs in the morning and at bedtime.   citalopram (CELEXA) 40 MG tablet TAKE 1 TABLET EVERY DAY   Ferrous Sulfate (IRON) 325 (65 Fe) MG TABS Take 1 tablet by mouth in the morning and at bedtime.   furosemide (LASIX) 20 MG tablet Take 1 tablet (20 mg total) by mouth daily as needed.   losartan (COZAAR) 50 MG tablet TAKE 1/2 TABLET(25 MG) BY MOUTH DAILY   OXYGEN Inhale into the lungs at bedtime.   phenytoin (DILANTIN) 100 MG ER capsule TAKE 3 CAPSULES EVERY DAY   tamsulosin (FLOMAX) 0.4 MG CAPS capsule TAKE 1 CAPSULE(0.4 MG) BY MOUTH DAILY   [DISCONTINUED] metoprolol tartrate (LOPRESSOR) 50 MG tablet Take 1 tablet (50 mg total) by mouth once for 1 dose. TWO HOURS PRIOR TO CARDIAC CTA     Allergies:   Nsaids   Social History   Socioeconomic History   Marital status: Married    Spouse name: Not on file   Number of children: Not on file   Years of education: Not on file   Highest education level: Not on file  Occupational History   Not on file  Tobacco Use   Smoking status: Every Day    Packs/day: 0.75    Years: 51.00    Additional pack years: 0.00    Total pack years: 38.25    Types: Cigarettes   Smokeless tobacco: Never   Tobacco comments:    8-10 cigarettes daily--03/18/2022  Vaping Use   Vaping Use: Never used  Substance and Sexual Activity   Alcohol use: No    Alcohol/week: 0.0 standard drinks of alcohol   Drug use: Not Currently    Types: Marijuana    Comment: 2 month   Sexual activity: Yes    Partners: Male  Other Topics Concern   Not on file  Social History Narrative    Married, 2 grown children   Eighth grade education.  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   Originally from [place].   Tobacco 100 pack-yr hx, still smoking as of 10/2011 (1 pack per day).   Denies alcohol or drug use.   No exercise.   Social Determinants of Health   Financial Resource Strain: Low Risk  (01/01/2022)   Overall Financial Resource Strain (CARDIA)    Difficulty of Paying Living Expenses: Not hard at all  Food Insecurity: No Food Insecurity (01/01/2022)   Hunger Vital Sign    Worried About Running Out of Food in the Last Year: Never true    Ran Out of Food in the Last Year: Never true  Transportation Needs: No Transportation Needs (01/01/2022)   PRAPARE -  Hydrologist (Medical): No    Lack of Transportation (Non-Medical): No  Physical Activity: Sufficiently Active (01/01/2022)   Exercise Vital Sign    Days of Exercise per Week: 5 days    Minutes of Exercise per Session: 30 min  Stress: No Stress Concern Present (01/01/2022)   [place]    Feeling of Stress : Not at all  Social Connections: Moderately Isolated (01/01/2022)   Social Connection and Isolation Panel [NHANES]    Frequency of Communication with Friends and Family: More than three times a week    Frequency of Social Gatherings with Friends and Family: More than three times a week    Attends Religious Services: Never    Marine scientist or Organizations: No    Attends Music therapist: Never    Marital Status: Married     Family History: The patient's family history includes Cancer in his sister; Cirrhosis in his brother and sister; Heart disease in his father and mother; Liver cancer in his cousin. There is no history of Stroke.  ROS:   Please see the history of present illness.     All other systems reviewed and are negative.  EKGs/Labs/Other Studies Reviewed:     The following studies were reviewed today:   EKG:  EKG is  ordered today.  The ekg ordered today demonstrates normal sinus rhythm  Recent Labs: 03/15/2022: ALT 22; BUN 10; Creat 0.67; Hemoglobin 12.6; Platelets 215; Potassium 4.6; Sodium 139  Recent Lipid Panel    Component Value Date/Time   CHOL 178 03/15/2022 1445   TRIG 82 03/15/2022 1445   HDL 64 03/15/2022 1445   CHOLHDL 2.8 03/15/2022 1445   VLDL 26.0 02/05/2020 1252   LDLCALC 97 03/15/2022 1445     Risk Assessment/Calculations:             Physical Exam:    VS:  BP 112/78 (BP Location: Right Arm)   Pulse 62   Ht 5\' 8"  (1.727 m)   Wt 116 lb 6.4 oz (52.8 kg)   SpO2 95% Comment: 2 liters  BMI 17.70 kg/m     Wt Readings from Last 3 Encounters:  05/05/22 116 lb 6.4 oz (52.8 kg)  03/18/22 121 lb 12.8 oz (55.2 kg)  03/15/22 118 lb (53.5 kg)     GEN:  Well nourished, well developed in no acute distress HEENT: Normal NECK: No JVD; No carotid bruits CARDIAC: RRR, no murmurs, rubs, gallops RESPIRATORY: Bilateral wheezing ABDOMEN: Soft, non-tender, non-distended MUSCULOSKELETAL:  1+ edema; No deformity  SKIN: Warm and dry NEUROLOGIC:  Alert and oriented x 3 PSYCHIATRIC:  Normal affect   ASSESSMENT:    1. Precordial pain   2. Heart failure with preserved ejection fraction, unspecified HF chronicity ([place])   3. Coronary artery disease, unspecified vessel or lesion type, unspecified whether angina present, unspecified whether native or transplanted heart   4. Primary hypertension   5. Smoking    PLAN:    In order of problems listed above:  Chest pain, coronary artery disease.  Echo with normal EF, get coronary CTA to rule out obstructive disease.  Start aspirin 81 mg daily. HFpEF, grade 2 diastolic dysfunction, EF 55 to 60%.  1+ edema.  Start Lasix 20 mg daily as needed. CAD/calcifications on chest CT.  Start aspirin 81 mg, continue Lipitor 20 mg daily. Hypertension, BP controlled.  Continue losartan 25 mg  daily. Current smoker, smoking  cessation advised.  Follow-up after coronary CTA      Medication Adjustments/Labs and Tests Ordered: Current medicines are reviewed at length with the patient today.  Concerns regarding medicines are outlined above.  Orders Placed This Encounter  Procedures   CT CORONARY MORPH W/CTA COR W/SCORE W/CA W/CM &/OR WO/CM   Basic Metabolic Panel (BMET)   EKG 12-Lead   Meds ordered this encounter  Medications   DISCONTD: metoprolol tartrate (LOPRESSOR) 50 MG tablet    Sig: Take 1 tablet (50 mg total) by mouth once for 1 dose. TWO HOURS PRIOR TO CARDIAC CTA    Dispense:  1 tablet    Refill:  `0   furosemide (LASIX) 20 MG tablet    Sig: Take 1 tablet (20 mg total) by mouth daily as needed.    Dispense:  90 tablet    Refill:  0   aspirin EC 81 MG tablet    Sig: Take 1 tablet (81 mg total) by mouth daily. Swallow whole.    Dispense:  90 tablet    Refill:  3   metoprolol tartrate (LOPRESSOR) 50 MG tablet    Sig: Take 1 tablet (50 mg total) by mouth once for 1 dose. TWO HOURS PRIOR TO CARDIAC CTA    Dispense:  1 tablet    Refill:  `0    Patient Instructions  Medication Instructions:   1. START Aspirin - take one tablet ( 81 mg) by mouth daily.  2. START Lasix - take one tablet ( 20mg ) by mouth daily as needed for swelling.   *If you need a refill on your cardiac medications before your next appointment, please call your pharmacy*   Lab Work:  Your physician recommends you go to the medical mall for labs.   If you have labs (blood work) drawn today and your tests are completely normal, you will receive your results only by: [place] (if you have MyChart) OR A paper copy in the mail If you have any lab test that is abnormal or we need to change your treatment, we will call you to review the results.   Testing/Procedures:    Your cardiac CT will be scheduled at   [facility] [address] [place], [place] [address] [phone]   If scheduled at [facility] or [facility], please arrive 15 mins early for check-in and test prep.   Please follow these instructions carefully (unless otherwise directed):  Hold all erectile dysfunction medications at least 3 days (72 hrs) prior to test. (Ie viagra, cialis, sildenafil, tadalafil, etc) We will administer nitroglycerin during this exam.   On the Night Before the Test: Be sure to Drink plenty of water. Do not consume any caffeinated/decaffeinated beverages or chocolate 12 hours prior to your test. Do not take any antihistamines 12 hours prior to your test.   On the Day of the Test: Drink plenty of water until 1 hour prior to the test. Do not eat any food 1 hour prior to test. HOLD LASIX THE DAY OF THE TEST You may take your regular medications prior to the test.  Take metoprolol (Lopressor) two hours prior to test.       After the Test: Drink plenty of water. After receiving IV contrast, you may experience a mild flushed feeling. This is normal. On occasion, you may experience a mild rash up to 24 hours after the test. This is not dangerous. If this occurs,  you can take Benadryl 25 mg and increase your fluid intake. If you experience trouble breathing, this can be serious. If it is severe call 911 IMMEDIATELY. If it is mild, please call our office. If you take any of these medications: Glipizide/Metformin, Avandament, Glucavance, please do not take 48 hours after completing test unless otherwise instructed.     Follow-Up: At [facility], you and your health needs are our priority.  As part of our continuing mission to provide you with exceptional heart care, we have created designated Provider Care Teams.  These Care Teams include your primary Cardiologist (physician) and Advanced Practice Providers (APPs -  Physician Assistants and Nurse Practitioners) who all work  together to provide you with the care you need, when you need it.  We recommend signing up for the patient portal called "MyChart".  Sign up information is provided on this After Visit Summary.  MyChart is used to connect with patients for Virtual Visits (Telemedicine).  Patients are able to view lab/test results, encounter notes, upcoming appointments, etc.  Non-urgent messages can be sent to your provider as well.   To learn more about what you can do with MyChart, go to [email or web address].    Your next appointment:    After Cardiac CT  Provider:   You may see Kate Sable, MD or one of the following Advanced Practice Providers on your designated Care Team:   Murray Hodgkins, NP Christell Faith, PA-C Cadence Kathlen Mody, PA-C Gerrie Nordmann, NP   Signed, Kate Sable, MD  05/05/2022 3:09 PM    [place]

## 2022-05-05 NOTE — Patient Instructions (Signed)
Medication Instructions:   1. START Aspirin - take one tablet ( 81 mg) by mouth daily.  2. START Lasix - take one tablet ( 20mg ) by mouth daily as needed for swelling.   *If you need a refill on your cardiac medications before your next appointment, please call your pharmacy*   Lab Work:  Your physician recommends you go to the medical mall for labs.   If you have labs (blood work) drawn today and your tests are completely normal, you will receive your results only by: [place] (if you have MyChart) OR A paper copy in the mail If you have any lab test that is abnormal or we need to change your treatment, we will call you to review the results.   Testing/Procedures:    Your cardiac CT will be scheduled at   [facility] [address] [place], [place] [address] [phone]   If scheduled at [facility] or [facility], please arrive 15 mins early for check-in and test prep.   Please follow these instructions carefully (unless otherwise directed):  Hold all erectile dysfunction medications at least 3 days (72 hrs) prior to test. (Ie viagra, cialis, sildenafil, tadalafil, etc) We will administer nitroglycerin during this exam.   On the Night Before the Test: Be sure to Drink plenty of water. Do not consume any caffeinated/decaffeinated beverages or chocolate 12 hours prior to your test. Do not take any antihistamines 12 hours prior to your test.   On the Day of the Test: Drink plenty of water until 1 hour prior to the test. Do not eat any food 1 hour prior to test. HOLD LASIX THE DAY OF THE TEST You may take your regular medications prior to the test.  Take metoprolol (Lopressor) two hours prior to test.       After the Test: Drink plenty of water. After receiving IV contrast, you may experience a mild flushed feeling. This is normal. On occasion, you may experience a  mild rash up to 24 hours after the test. This is not dangerous. If this occurs, you can take Benadryl 25 mg and increase your fluid intake. If you experience trouble breathing, this can be serious. If it is severe call 911 IMMEDIATELY. If it is mild, please call our office. If you take any of these medications: Glipizide/Metformin, Avandament, Glucavance, please do not take 48 hours after completing test unless otherwise instructed.     Follow-Up: At [facility], you and your health needs are our priority.  As part of our continuing mission to provide you with exceptional heart care, we have created designated Provider Care Teams.  These Care Teams include your primary Cardiologist (physician) and Advanced Practice Providers (APPs -  Physician Assistants and Nurse Practitioners) who all work together to provide you with the care you need, when you need it.  We recommend signing up for the patient portal called "MyChart".  Sign up information is provided on this After Visit Summary.  MyChart is used to connect with patients for Virtual Visits (Telemedicine).  Patients are able to view lab/test results, encounter notes, upcoming appointments, etc.  Non-urgent messages can be sent to your provider as well.   To learn more about what you can do with MyChart, go to [email or web address].    Your next appointment:    After Cardiac CT  Provider:   You may see Kate Sable, MD or one of the following Advanced Practice  Providers on your designated Care Team:   Murray Hodgkins, NP Christell Faith, PA-C Cadence Kathlen Mody, PA-C Gerrie Nordmann, NP

## 2022-05-17 ENCOUNTER — Other Ambulatory Visit: Payer: Self-pay | Admitting: Internal Medicine

## 2022-05-17 NOTE — Telephone Encounter (Signed)
Requested medications are due for refill today.  unsure  Requested medications are on the active medications list.  yes  Last refill. 04/16/2022 #90 0 rf  Future visit scheduled.   no  Notes to clinic.  Refill not delegated.    Requested Prescriptions  Pending Prescriptions Disp Refills   ALPRAZolam (XANAX) 0.5 MG tablet [Pharmacy Med Name: ALPRAZOLAM 0.5MG  TABLETS] 90 tablet     Sig: TAKE 1 TABLET(0.5 MG) BY MOUTH THREE TIMES DAILY     Not Delegated - Psychiatry: Anxiolytics/Hypnotics 2 Failed - 05/17/2022  9:02 AM      Failed - This refill cannot be delegated      Failed - Urine Drug Screen completed in last 360 days      Passed - Patient is not pregnant      Passed - Valid encounter within last 6 months    Recent Outpatient Visits           2 months ago Encounter for annual general medical examination with abnormal findings in adult   [facility] [place], Coralie Keens, NP   2 months ago Stage 3 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 [facility])   [place], Grayland Ormond A, RPH-CPP   4 months ago Stage 3 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 [facility])   [place], Grayland Ormond A, RPH-CPP   8 months ago Aortic atherosclerosis [facility])   [facility] [place], Coralie Keens, NP   1 year ago Encounter for general adult medical examination with abnormal findings   [facility] [place], Coralie Keens, NP       Future Appointments             In 2 weeks Gerrie Nordmann, NP Normal at [facility]

## 2022-05-25 ENCOUNTER — Telehealth (HOSPITAL_COMMUNITY): Payer: Self-pay | Admitting: *Deleted

## 2022-05-25 NOTE — Telephone Encounter (Signed)
Reaching out to patient to offer assistance regarding upcoming cardiac imaging study; pt's answered phone and verbalizes understanding of appt date/time, parking situation and where to check in, pre-test NPO status and medications ordered, and verified current allergies; name and call back number provided for further questions should they arise  Larey Brick RN Navigator Cardiac Imaging [facility] [phone] office [phone] cell  Patient to take 50mg  metoprolol tartrate two hours prior to his cardiac CT scan.

## 2022-05-27 ENCOUNTER — Ambulatory Visit
Admission: RE | Admit: 2022-05-27 | Discharge: 2022-05-27 | Disposition: A | Discharge status: Home / Self Care | Payer: Medicare HMO | Source: Ambulatory Visit | Attending: Cardiology | Admitting: Cardiology

## 2022-05-27 DIAGNOSIS — I2510 Atherosclerotic heart disease of native coronary artery without angina pectoris: Secondary | ICD-10-CM

## 2022-05-27 DIAGNOSIS — R072 Precordial pain: Secondary | ICD-10-CM | POA: Diagnosis not present

## 2022-05-27 MED ORDER — METOPROLOL TARTRATE 5 MG/5ML IV SOLN
10.0000 mg | Freq: Once | INTRAVENOUS | Status: AC
  Administered 2022-05-27: 10 mg via INTRAVENOUS

## 2022-05-27 MED ORDER — NITROGLYCERIN 0.4 MG SL SUBL
0.8000 mg | SUBLINGUAL_TABLET | Freq: Once | SUBLINGUAL | Status: AC
  Administered 2022-05-27: 0.8 mg via SUBLINGUAL

## 2022-05-27 MED ORDER — IOHEXOL 350 MG/ML SOLN
75.0000 mL | Freq: Once | INTRAVENOUS | Status: AC | PRN
  Administered 2022-05-27: 75 mL via INTRAVENOUS

## 2022-05-27 NOTE — Progress Notes (Signed)
Patient tolerated CT well. Drank water after. Vital signs stable encourage to drink water throughout day.Reasons explained and verbalized understanding. Ambulated steady gait.  

## 2022-05-28 ENCOUNTER — Telehealth: Payer: Self-pay

## 2022-05-28 MED ORDER — ATORVASTATIN CALCIUM 40 MG PO TABS: 40.0000 mg | ORAL_TABLET | Freq: Every day | ORAL | 3 refills | Status: DC

## 2022-05-28 NOTE — Telephone Encounter (Signed)
-----   Message from Debbe Odea, MD sent at 05/27/2022  4:39 PM EDT ----- Coronary CT showed mild nonobstructive CAD.  Continue aspirin, increase Lipitor to 40 mg daily.

## 2022-05-28 NOTE — Telephone Encounter (Signed)
The patient has been notified of the result and verbalized understanding.  All questions (if any) were answered. Margrett Rud, [place] 05/28/2022 12:02 PM

## 2022-05-29 ENCOUNTER — Encounter: Payer: Self-pay | Admitting: Pulmonary Disease

## 2022-05-31 ENCOUNTER — Other Ambulatory Visit: Payer: Self-pay | Admitting: Internal Medicine

## 2022-05-31 DIAGNOSIS — F411 Generalized anxiety disorder: Secondary | ICD-10-CM

## 2022-06-01 NOTE — Telephone Encounter (Signed)
Requested Prescriptions  Pending Prescriptions Disp Refills   citalopram (CELEXA) 40 MG tablet [Pharmacy Med Name: CITALOPRAM HYDROBROMIDE 40 MG Tablet] 90 tablet 1    Sig: TAKE 1 TABLET EVERY DAY     Psychiatry:  Antidepressants - SSRI Passed - 05/31/2022  9:38 AM      Passed - Valid encounter within last 6 months    Recent Outpatient Visits           2 months ago Encounter for annual general medical examination with abnormal findings in adult   [facility] [place], Salvadore Oxford, NP   2 months ago Stage 3 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 [facility])   [facility] [place], Gentry Fitz A, RPH-CPP   4 months ago Stage 3 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 [facility])   [facility] [place], Gentry Fitz A, RPH-CPP   8 months ago Aortic atherosclerosis [facility])   [facility] [place], Salvadore Oxford, NP   1 year ago Encounter for general adult medical examination with abnormal findings   [place] [facility] [place], Salvadore Oxford, NP       Future Appointments             In 2 days Charlsie Quest, NP [facility]

## 2022-06-03 ENCOUNTER — Ambulatory Visit: Payer: Medicare HMO | Attending: Cardiology | Admitting: Cardiology

## 2022-06-03 ENCOUNTER — Encounter: Payer: Self-pay | Admitting: Cardiology

## 2022-06-03 VITALS — BP 118/70 | HR 71 | Ht 68.0 in | Wt 113.6 lb

## 2022-06-03 DIAGNOSIS — I10 Essential (primary) hypertension: Secondary | ICD-10-CM

## 2022-06-03 DIAGNOSIS — F17200 Nicotine dependence, unspecified, uncomplicated: Secondary | ICD-10-CM

## 2022-06-03 DIAGNOSIS — I2510 Atherosclerotic heart disease of native coronary artery without angina pectoris: Secondary | ICD-10-CM | POA: Diagnosis not present

## 2022-06-03 DIAGNOSIS — J449 Chronic obstructive pulmonary disease, unspecified: Secondary | ICD-10-CM | POA: Diagnosis not present

## 2022-06-03 DIAGNOSIS — I5030 Unspecified diastolic (congestive) heart failure: Secondary | ICD-10-CM | POA: Diagnosis not present

## 2022-06-03 NOTE — Patient Instructions (Signed)
Medication Instructions:   Your physician recommends that you continue on your current medications as directed. Please refer to the Current Medication list given to you today.  *If you need a refill on your cardiac medications before your next appointment, please call your pharmacy*   Lab Work:  Your physician recommends that you return for lab work in: 3 months at the medical mall. You will need to be fasting.  No appt is needed. Hours are M-F 7AM- 6 PM.  If you have labs (blood work) drawn today and your tests are completely normal, you will receive your results only by: MyChart Message (if you have MyChart) OR A paper copy in the mail If you have any lab test that is abnormal or we need to change your treatment, we will call you to review the results.   Testing/Procedures:  None Ordered   Follow-Up: At [facility], you and your health needs are our priority.  As part of our continuing mission to provide you with exceptional heart care, we have created designated Provider Care Teams.  These Care Teams include your primary Cardiologist (physician) and Advanced Practice Providers (APPs -  Physician Assistants and Nurse Practitioners) who all work together to provide you with the care you need, when you need it.  We recommend signing up for the patient portal called "MyChart".  Sign up information is provided on this After Visit Summary.  MyChart is used to connect with patients for Virtual Visits (Telemedicine).  Patients are able to view lab/test results, encounter notes, upcoming appointments, etc.  Non-urgent messages can be sent to your provider as well.   To learn more about what you can do with MyChart, go to [email or web address].    Your next appointment:   3 month(s)  Provider:   You may see Brian Agbor-Etang, MD or one of the following Advanced Practice Providers on your designated Care Team:   Christopher Berge, NP Ryan Dunn, PA-C Cadence Furth, PA-C Sheri  Hammock, NP  

## 2022-06-03 NOTE — Progress Notes (Signed)
Cardiology Office Note:   Date:  06/03/2022  ID:  Elijah Mcfarland, DOB March 23, 1950, MRN [id number]  History of Present Illness:   Elijah Mcfarland is a 72 y.o. male with past medical history of hypertension, hyperlipidemia, current smoker x 40+ years, COPD on chronic oxygen therapy of 2 L who presents today to follow-up on previous complaints of chest pain and shortness of breath as well as an outpatient coronary CTA.  He was last seen in clinic 05/05/22 by Dr. Azucena Cecil with complaints of shortness of breath on exertion for quite some time that he attributes to COPD.  He has recently just been followed by pulmonary medicine with an echocardiogram being ordered that showed grade 2 diastolic dysfunction.  He had endorsed occasional chest discomfort and chest pressure described as a tightness relieved with aspirin and albuterol.  He also had occasional peripheral edema.  States that he was started on furosemide 20 mg daily as needed, aspirin 81 mg daily, scheduled for coronary CTA to rule out obstructive disease.  He returns to clinic today stating that overall he has been doing fine.  He continues to have dyspnea and is on chronic oxygen.  He states he has followed up with pulmonary as well as Dr. Jayme Cloud in about small portable O2 machine.  After his coronary CT he was advised to increase his atorvastatin to 40 mg daily and has been doing well without complaints.  He is also on aspirin 81 mg daily.  Denies any recurrent chest discomfort, palpitations, peripheral edema, lightheadedness or dizziness.  He also denies any hospitalizations or visits to the emergency department.  ROS: 10 point review of systems is considered negative with the exception of what is been listed in the HPI  Studies Reviewed:    EKG: No new tracings were completed today   Coronary CTA 05/27/22 IMPRESSION: 1. Coronary calcium score of 436. This was 70th percentile for age and sex matched control.   2. Normal coronary origin  with right dominance.   3. Mild proximal RCA stenosis (25-49%).   4. Minimal proximal LAD stenosis (<25%).   5. CAD-RADS 2. Mild non-obstructive CAD (25-49%). Consider non-atherosclerotic causes of chest pain or dyspnea. Consider preventive therapy and risk factor modification.  TTE 01/27/22  1. Left ventricular ejection fraction, by estimation, is 55 to 60%. Left  ventricular ejection fraction by 2D MOD biplane is 58.2 %. The left  ventricle has normal function. The left ventricle has no regional wall  motion abnormalities. Left ventricular  diastolic parameters are consistent with Grade II diastolic dysfunction  (pseudonormalization).   2. Right ventricular systolic function is normal. The right ventricular  size is normal.   3. The mitral valve is normal in structure. Mild to moderate mitral valve  regurgitation.   4. The aortic valve is tricuspid. Aortic valve regurgitation is not  visualized.   5. The inferior vena cava is dilated in size with >50% respiratory  variability, suggesting right atrial pressure of 8 mmHg.    Risk Assessment/Calculations:              Physical Exam:   VS:  BP 118/70   Pulse 71   Ht  (1.727 m)   Wt 113 lb 9.6 oz (51.5 kg)   SpO2 94%   BMI 17.27 kg/m    Wt Readings from Last 3 Encounters:  06/03/22 113 lb 9.6 oz (51.5 kg)  05/05/22 116 lb 6.4 oz (52.8 kg)  03/18/22 121 lb 12.8 oz (55.2 kg)  GEN: Well nourished, well developed in no acute distress NECK: No JVD; No carotid bruits CARDIAC: RRR, no murmurs, rubs, gallops RESPIRATORY: Coarse with expiratory wheezing to auscultation, respirations are unlabored at rest on 2 L of O2 via nasal cannula ABDOMEN: Soft, non-tender, non-distended EXTREMITIES: Trace pretibial edema; No deformity   ASSESSMENT AND PLAN:   HFpEF with grade 2 diastolic dysfunction with an LVEF of 55 to 60%.  He appears euvolemic on exam today.  He is continued on furosemide 20 mg as he has been taking it daily  and states that his dyspnea is slightly better.  Consider possible initiation of an SGLT2 inhibitor on return.  MRA started today as patient's potassium was on the higher side of normal.  Mild nonobstructive coronary artery disease found on coronary CTA with 25-49% in the proximal RCA.  Denies any chest discomfort today.  He is continued on aspirin 81 mg daily, and atorvastatin 40 mg daily.  With the recent increase of his atorvastatin to 40 mg daily he will need a lipid and hepatic panel rechecked in 3 months.  Essential hypertension with blood pressure today of 118/70.  Blood pressures remained stable.  He is continued on furosemide 20 mg daily and losartan 25 mg daily.  Continue to monitor his blood pressure at home 1 to 2 hours post medication.  COPD on chronic oxygen therapy.  He continues to follow with pulmonary.  Tobacco abuse he continues to smoke and has been smoking for 51 years.  Cessation is recommended.  Disposition patient return to clinic to see MD/APP in 3 months or sooner if needed.        Signed, Aslan Himes, NP

## 2022-06-04 ENCOUNTER — Encounter: Payer: Self-pay | Admitting: Pulmonary Disease

## 2022-06-09 ENCOUNTER — Other Ambulatory Visit: Payer: Self-pay | Admitting: Internal Medicine

## 2022-06-09 DIAGNOSIS — I10 Essential (primary) hypertension: Secondary | ICD-10-CM

## 2022-06-09 NOTE — Telephone Encounter (Signed)
Requested Prescriptions  Pending Prescriptions Disp Refills   tamsulosin (FLOMAX) 0.4 MG CAPS capsule [Pharmacy Med Name: TAMSULOSIN 0.4MG  CAPSULES] 90 capsule 0    Sig: TAKE 1 CAPSULE(0.4 MG) BY MOUTH DAILY     Urology: Alpha-Adrenergic Blocker Passed - 06/09/2022  3:32 AM      Passed - PSA in normal range and within 360 days    PSA  Date Value Ref Range Status  03/15/2022 0.95 < OR = 4.00 ng/mL Final    Comment:    The total PSA value from this assay system is  standardized against the WHO standard. The test  result will be approximately 20% lower when compared  to the equimolar-standardized total PSA (Beckman  Coulter). Comparison of serial PSA results should be  interpreted with this fact in mind. . This test was performed using the Siemens  chemiluminescent method. Values obtained from  different assay methods cannot be used interchangeably. PSA levels, regardless of value, should not be interpreted as absolute evidence of the presence or absence of disease.   02/05/2020 0.92 0.10 - 4.00 ng/ml Final    Comment:    Test performed using Access Hybritech PSA Assay, a parmagnetic partical, chemiluminecent immunoassay.         Passed - Last BP in normal range    BP Readings from Last 1 Encounters:  06/03/22 118/70         Passed - Valid encounter within last 12 months    Recent Outpatient Visits           2 months ago Encounter for annual general medical examination with abnormal findings in adult   [facility] [place], Salvadore Oxford, NP   3 months ago Stage 3 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 [facility])   [place] [facility] D[place], Gentry Fitz A, RPH-CPP   4 months ago Stage 3 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 [facility])   [place] [facility], Gentry Fitz A, RPH-CPP   8 months ago Aortic atherosclerosis [facility])   [place] [facility] [place], Salvadore Oxford, NP   1 year ago Encounter for general adult  medical examination with abnormal findings   [facility] [address] [place], Salvadore Oxford, NP       Future Appointments             In 2 months Charlsie Quest, NP [place] [facility]             losartan (COZAAR) 50 MG tablet [Pharmacy Med Name: LOSARTAN  TABLETS] 45 tablet 0    Sig: TAKE 1/2 TABLET(25 MG) BY MOUTH DAILY     Cardiovascular:  Angiotensin Receptor Blockers Passed - 06/09/2022  3:32 AM      Passed - Cr in normal range and within 180 days    Creat  Date Value Ref Range Status  03/15/2022 0.67 (L) 0.70 - 1.35 mg/dL Final   Creatinine, Ser  Date Value Ref Range Status  05/05/2022 0.71 0.61 - 1.24 mg/dL Final   Creatinine,U  Date Value Ref Range Status  11/03/2012 46.42 mg/dL Final    Comment:       Cutoff Values for Urine Drug Screen, Pain Mgmt          Drug Class           Cutoff (ng/mL)          Amphetamines  500          Barbiturates             200          Cocaine Metabolites      150          Benzodiazepines          200          Methadone                300          Opiates                  300          Phencyclidine             25          Propoxyphene             300          Marijuana Metabolites     50    For medical purposes only.         Passed - K in normal range and within 180 days    Potassium  Date Value Ref Range Status  05/05/2022 3.8 3.5 - 5.1 mmol/L Final         Passed - Patient is not pregnant      Passed - Last BP in normal range    BP Readings from Last 1 Encounters:  06/03/22 118/70         Passed - Valid encounter within last 6 months    Recent Outpatient Visits           2 months ago Encounter for annual general medical examination with abnormal findings in adult   [facility] [place], Salvadore Oxford, NP   3 months ago Stage 3 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 [facility])   [place] [facility] Delles, Gentry Fitz A, RPH-CPP   4 months  ago Stage 3 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 [facility])   [place] [facility] D[facility], Gentry Fitz A, RPH-CPP   8 months ago Aortic atherosclerosis [facility])   [place] [facility] [place], Salvadore Oxford, NP   1 year ago Encounter for general adult medical examination with abnormal findings   [place] [facility] [place], Salvadore Oxford, NP       Future Appointments             In 2 months Charlsie Quest, NP  [facility]

## 2022-06-16 ENCOUNTER — Other Ambulatory Visit: Payer: Self-pay | Admitting: Internal Medicine

## 2022-06-16 NOTE — Telephone Encounter (Signed)
Requested medication (s) are due for refill today - yes  Requested medication (s) are on the active medication list -yes  Future visit scheduled -no  Last refill: 05/18/22 #90  Notes to clinic: non delegated Rx  Requested Prescriptions  Pending Prescriptions Disp Refills   ALPRAZolam (XANAX) 0.5 MG tablet [Pharmacy Med Name: ALPRAZOLAM 0.5MG  TABLETS] 90 tablet     Sig: TAKE 1 TABLET(0.5 MG) BY MOUTH THREE TIMES DAILY     Not Delegated - Psychiatry: Anxiolytics/Hypnotics 2 Failed - 06/16/2022  9:03 AM      Failed - This refill cannot be delegated      Failed - Urine Drug Screen completed in last 360 days      Passed - Patient is not pregnant      Passed - Valid encounter within last 6 months    Recent Outpatient Visits           3 months ago Encounter for annual general medical examination with abnormal findings in adult   [facility] [place], Salvadore Oxford, NP   3 months ago Stage 3 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 [facility])   [place] [facility] Delles, Gentry Fitz A, RPH-CPP   5 months ago Stage 3 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 [facility])   [place] [facility] D[place], Gentry Fitz A, RPH-CPP   9 months ago Aortic atherosclerosis [facility])   [place] [facility] [place], Salvadore Oxford, NP   1 year ago Encounter for general adult medical examination with abnormal findings   [place] [facility] [place], Salvadore Oxford, NP       Future Appointments             In 2 months Charlsie Quest, NP [place] [facility] [employer] Prescriptions  Pending Prescriptions Disp Refills   ALPRAZolam (XANAX) 0.5 MG tablet [Pharmacy Med Name: ALPRAZOLAM 0.5MG  TABLETS] 90 tablet     Sig: TAKE 1 TABLET(0.5 MG) BY MOUTH THREE TIMES DAILY     Not Delegated - Psychiatry: Anxiolytics/Hypnotics 2 Failed - 06/16/2022  9:03 AM      Failed - This refill cannot be delegated      Failed -  Urine Drug Screen completed in last 360 days      Passed - Patient is not pregnant      Passed - Valid encounter within last 6 months    Recent Outpatient Visits           3 months ago Encounter for annual general medical examination with abnormal findings in adult   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 [facility])   [place] [facility] Delles, Gentry Fitz A, RPH-CPP   5 months ago Stage 3 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 [facility])   [place] [facility] Delles, Gentry Fitz A, RPH-CPP   9 months ago Aortic atherosclerosis [facility])   [place] [facility] [place], Salvadore Oxford, NP   1 year ago Encounter for general adult medical examination with abnormal findings   [place] [facility] [place], Salvadore Oxford, NP       Future Appointments             In 2 months Charlsie Quest, NP  HeartCare at [facility]

## 2022-06-22 IMAGING — DX DG SHOULDER 2+V*R*
3 series · 3 of 3 positions shown · non-contrast
Comparison: None.

CLINICAL DATA: Right shoulder pain

EXAM:
RIGHT SHOULDER - 2+ VIEW

[shoulder axial]
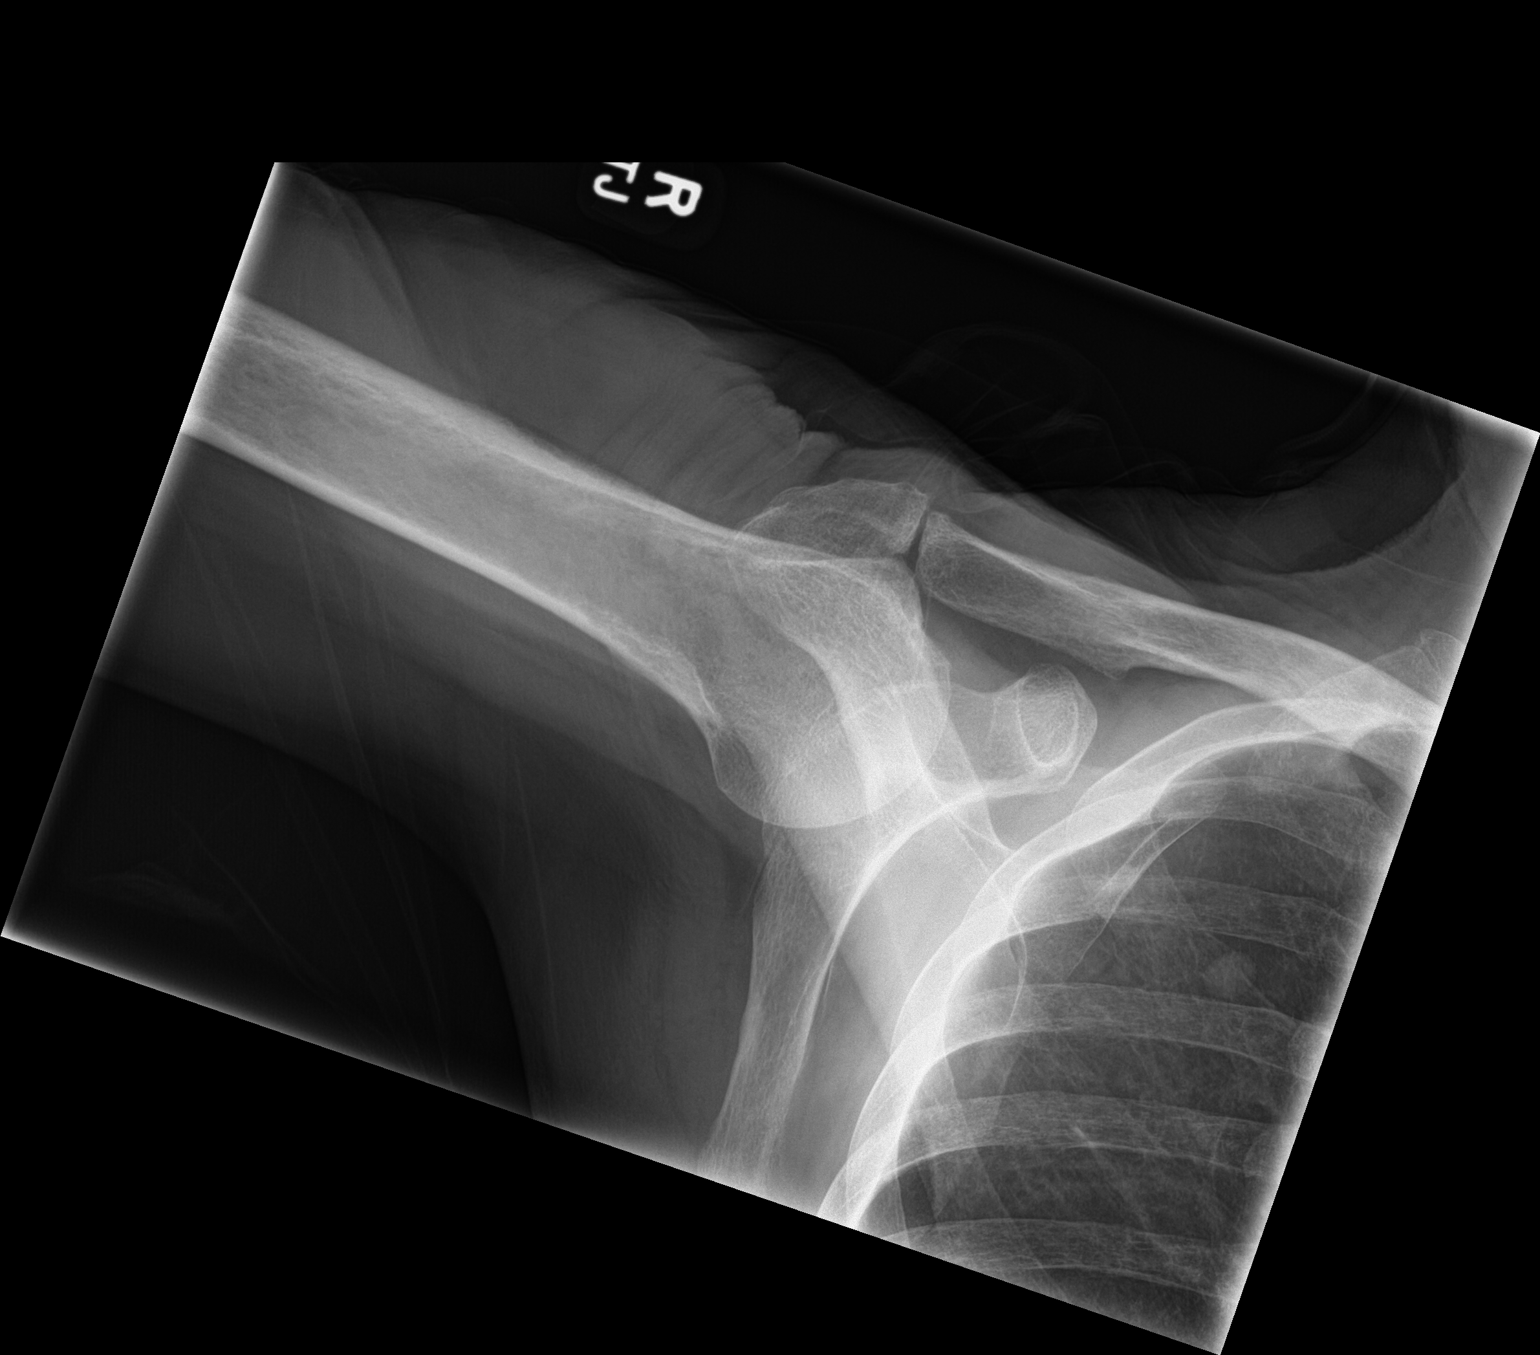

[shoulder obl]
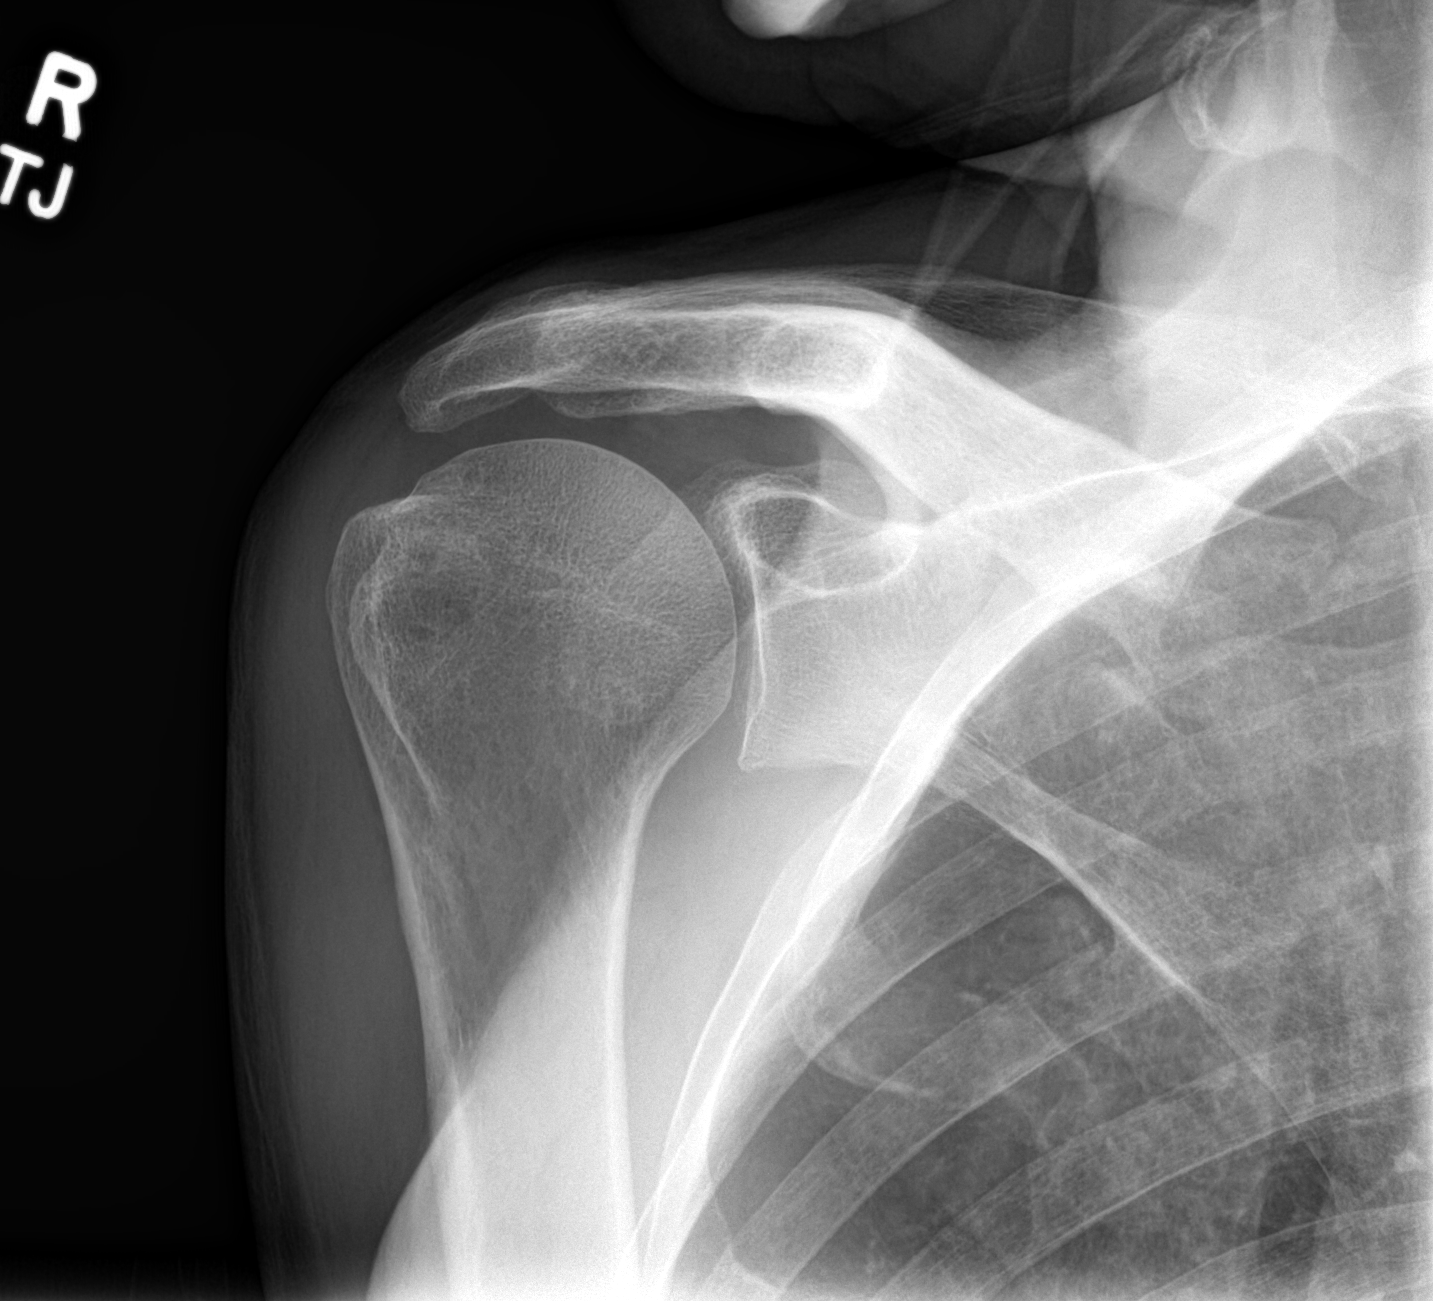

[shoulder y-view]
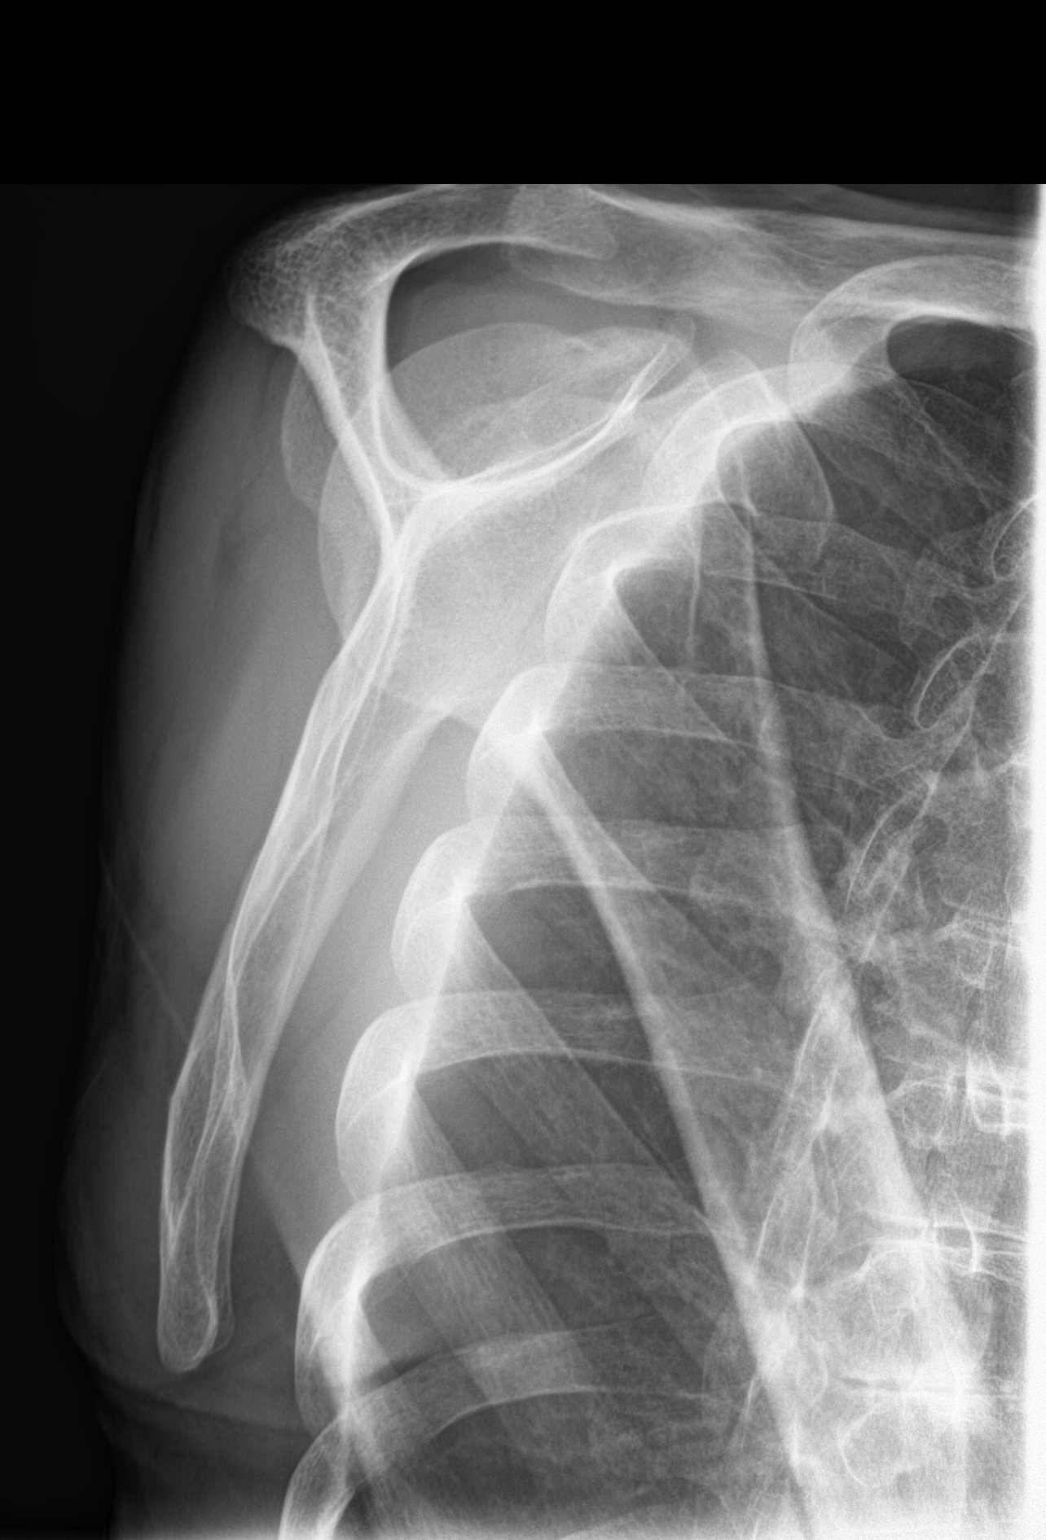

[3 of 3 positions shown; findings below may reference images not displayed]

FINDINGS: Frontal, transscapular, and axillary views of the right shoulder are
obtained. No fracture, subluxation, or dislocation. There is mild
glenohumeral osteoarthritis. Minimal spurring along the undersurface
of the acromion process. Mild hypertrophic changes of the
acromioclavicular joint. Right chest is clear.
IMPRESSION: 1. Mild acromioclavicular and glenohumeral joint osteoarthritis. No
acute bony abnormality.

## 2022-06-28 ENCOUNTER — Ambulatory Visit: Payer: Medicare HMO | Admitting: Pulmonary Disease

## 2022-07-06 ENCOUNTER — Ambulatory Visit
Admission: RE | Admit: 2022-07-06 | Discharge: 2022-07-06 | Disposition: A | Discharge status: Home / Self Care | Payer: Medicare HMO | Source: Ambulatory Visit | Attending: Pulmonary Disease | Admitting: Pulmonary Disease

## 2022-07-06 ENCOUNTER — Encounter: Payer: Self-pay | Admitting: Pulmonary Disease

## 2022-07-06 ENCOUNTER — Ambulatory Visit: Payer: Medicare HMO | Admitting: Pulmonary Disease

## 2022-07-06 ENCOUNTER — Other Ambulatory Visit
Admission: RE | Admit: 2022-07-06 | Discharge: 2022-07-06 | Disposition: A | Discharge status: Home / Self Care | Payer: Medicare HMO | Source: Ambulatory Visit | Attending: Pulmonary Disease | Admitting: Pulmonary Disease

## 2022-07-06 VITALS — BP 112/68 | HR 85 | Temp 98.2°F | Ht 68.0 in | Wt 113.6 lb

## 2022-07-06 DIAGNOSIS — R062 Wheezing: Secondary | ICD-10-CM | POA: Insufficient documentation

## 2022-07-06 DIAGNOSIS — Z5181 Encounter for therapeutic drug level monitoring: Secondary | ICD-10-CM

## 2022-07-06 DIAGNOSIS — J441 Chronic obstructive pulmonary disease with (acute) exacerbation: Secondary | ICD-10-CM | POA: Insufficient documentation

## 2022-07-06 DIAGNOSIS — R0602 Shortness of breath: Secondary | ICD-10-CM | POA: Diagnosis not present

## 2022-07-06 DIAGNOSIS — R059 Cough, unspecified: Secondary | ICD-10-CM | POA: Diagnosis not present

## 2022-07-06 DIAGNOSIS — J449 Chronic obstructive pulmonary disease, unspecified: Secondary | ICD-10-CM | POA: Diagnosis not present

## 2022-07-06 DIAGNOSIS — F17210 Nicotine dependence, cigarettes, uncomplicated: Secondary | ICD-10-CM

## 2022-07-06 DIAGNOSIS — J9611 Chronic respiratory failure with hypoxia: Secondary | ICD-10-CM

## 2022-07-06 LAB — CBC WITH DIFFERENTIAL/PLATELET
Abs Immature Granulocytes: 0.02 10*3/uL (ref 0.00–0.07)
Basophils Absolute: 0.1 10*3/uL (ref 0.0–0.1)
Basophils Relative: 1 %
Eosinophils Absolute: 1.2 10*3/uL — ABNORMAL HIGH (ref 0.0–0.5)
Eosinophils Relative: 11 %
HCT: 39.1 % (ref 39.0–52.0)
Hemoglobin: 13.1 g/dL (ref 13.0–17.0)
Immature Granulocytes: 0 %
Lymphocytes Relative: 19 %
Lymphs Abs: 1.9 10*3/uL (ref 0.7–4.0)
MCH: 31.3 pg (ref 26.0–34.0)
MCHC: 33.5 g/dL (ref 30.0–36.0)
MCV: 93.3 fL (ref 80.0–100.0)
Monocytes Absolute: 0.8 10*3/uL (ref 0.1–1.0)
Monocytes Relative: 8 %
Neutro Abs: 6.2 10*3/uL (ref 1.7–7.7)
Neutrophils Relative %: 61 %
Platelets: 190 10*3/uL (ref 150–400)
RBC: 4.19 MIL/uL — ABNORMAL LOW (ref 4.22–5.81)
RDW: 13.9 % (ref 11.5–15.5)
WBC: 10.1 10*3/uL (ref 4.0–10.5)
nRBC: 0 % (ref 0.0–0.2)

## 2022-07-06 MED ORDER — ALBUTEROL SULFATE (2.5 MG/3ML) 0.083% IN NEBU
2.5000 mg | INHALATION_SOLUTION | Freq: Once | RESPIRATORY_TRACT | Status: AC
Start: 2022-07-06 — End: 2022-07-06
  Administered 2022-07-06: 2.5 mg via RESPIRATORY_TRACT

## 2022-07-06 MED ORDER — DOXYCYCLINE MONOHYDRATE 100 MG PO CAPS: 100.0000 mg | ORAL_CAPSULE | Freq: Two times a day (BID) | ORAL | 0 refills | Status: AC

## 2022-07-06 MED ORDER — METHYLPREDNISOLONE 4 MG PO TBPK: ORAL_TABLET | ORAL | 0 refills | Status: DC

## 2022-07-06 NOTE — Progress Notes (Signed)
Subjective:    Patient ID: Elijah Mcfarland, male    DOB: 12-23-1950, 72 y.o.   MRN: [id number] Patient Care Team: Lorre Munroe, NP as PCP - General (Internal Medicine) Debbe Odea, MD as PCP - Cardiology (Cardiology) Ronney Asters Jackelyn Poling, RPH-CPP as Pharmacist Salena Saner, MD as Consulting Physician (Pulmonary Disease)  Chief Complaint  Patient presents with   Follow-up    SOB with exertion. Wheezing. Cough with white sputum.   HPI Elijah Mcfarland is a 72 year old current smoker (8 to 10 cigarettes/day) who presents for follow-up on the issue of COPD Gold class III.  This is a scheduled visit.  He was last seen here on 18 March 2022 at that time he qualified for oxygen at 2 L/min with activity and continued use of oxygen with sleep.  Today he states that he is getting both theophylline and Daliresp.  Previously could not get the medications filled.  I am not certain that he is taking both of these medications.  His options with regards to respiratory medications are limited.  A 2D echo was also ordered at that time shows that he has grade 2 diastolic dysfunction and mitral regurgitation.  He is being followed now by cardiology.  Today he presents stating that he does not feel well.  States he thinks his sinuses are giving him most trouble as he has nasal congestion, some purulent nasal discharge and feels that his chest is tight.  He notices wheezing.  No fevers, chills or sweats.  He continues to smoke anywhere between 8 to 10 cigarettes a day.  He still has significant sputum production during the day whitish to gray.  This is a chronic issue. He has not had any fevers, chills or sweats.  No hemoptysis.  He has chronic dyspnea class II-III.  He does not endorse any other symptomatology.  He has noted increased wheezing over the last 2 to 3 days.  No change in quality and character of cough and sputum production.  No hemoptysis.  He is compliant with oxygen at 2 L/min.   DATA 03/28/2019  echocardiogram: LVEF 55 to 60%, no regional wall motion abnormality, grade 1 DD, normal right ventricular systolic function.  No valvular abnormalities 03/29/2019 PFTs: FEV1 1.17 L or 40% predicted, FVC 2.68 L or 68% predicted, FEV1/FVC 44%, there is bronchodilator response with regards to air trapping.  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  FEV1 1.23 L or 43% predicted, FVC 2.90 L or 76% predicted, FEV1/FVC 42%, there is bronchodilator response with regards to air trapping.  There is air trapping without hyperinflation, small airways component.  Diffusion capacity severely reduced as prior.  Overall no significant change.  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   Stage 3 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 (HCC) 04/25/2006   Arthritis 04/25/2006   Social History   Tobacco Use   Smoking status: Every Day    Packs/day: 0.75    Years: 51.00    Additional pack years: 0.00    Total pack years: 38.25    Types: Cigarettes   Smokeless tobacco: Never   Tobacco comments:    8-10 cigarettes daily--07/06/2022 khj   Substance Use Topics   Alcohol use: No    Alcohol/week: 0.0 standard drinks of alcohol   Allergies  Allergen Reactions   Nsaids Other (See Comments)    GI bleed and vomiting   Current Meds  Medication Sig   acetaminophen (TYLENOL) 325 MG tablet Take 2 tablets (650 mg total) by mouth every 6 (six) hours as needed for mild pain or headache (or Fever >/= 101).   albuterol (PROVENTIL) (2.5 MG/3ML) 0.083% nebulizer solution Take 3 mLs (2.5 mg total) by nebulization every 6 (six) hours as needed for wheezing or shortness of breath.   albuterol (VENTOLIN HFA) 108 (90 Base) MCG/ACT inhaler INHALE 2 PUFFS INTO THE LUNGS EVERY 6 HOURS AS NEEDED FOR WHEEZING OR SHORTNESS OF BREATH   ALPRAZolam (XANAX) 0.5 MG tablet TAKE 1 TABLET(0.5 MG) BY MOUTH THREE TIMES DAILY   aspirin EC 81 MG tablet Take 1 tablet (81 mg total) by mouth daily. Swallow whole.   atorvastatin (LIPITOR) 40 MG tablet Take 1 tablet (40 mg total) by mouth daily.   Budeson-Glycopyrrol-Formoterol (BREZTRI AEROSPHERE) 160-9-4.8 MCG/ACT AERO Inhale 2 puffs into the lungs in the morning and at bedtime.   citalopram (CELEXA) 40 MG tablet TAKE 1 TABLET EVERY DAY   Ferrous Sulfate (IRON) 325 (65 Fe) MG TABS Take 1 tablet by mouth in the morning and at bedtime.   furosemide (LASIX) 20 MG tablet Take 1 tablet (20 mg total) by mouth daily as needed.   levocetirizine (XYZAL) 5 MG tablet Take 5 mg by mouth every evening.   losartan (COZAAR) 50 MG tablet TAKE 1/2 TABLET(25 MG) BY MOUTH DAILY   Nutritional Supplements (NUTRITIONAL SUPPLEMENT PO) Take by mouth. Super Beta Prostate Advanced   OXYGEN Inhale into the lungs at bedtime.   phenytoin (DILANTIN) 100 MG ER capsule TAKE 3 CAPSULES EVERY DAY   roflumilast (DALIRESP) 500 MCG TABS tablet Take 1 tablet (500 mcg total) by mouth daily.   tamsulosin (FLOMAX) 0.4 MG CAPS capsule TAKE 1 CAPSULE(0.4 MG) BY MOUTH DAILY   theophylline (THEODUR) 100 MG 12 hr tablet Take 1 tablet (100 mg total) by mouth 2  (two) times daily.   Immunization History  Administered Date(s) Administered   Fluad Quad(high Dose 65+) 11/17/2018, 02/05/2020, 03/10/2021, 03/15/2022   H1N1 01/05/2008   Influenza Whole 11/07/2007   Influenza, High Dose Seasonal PF 03/03/2018   Influenza, Seasonal, Injecte, Preservative Fre 04/13/2013   Influenza,inj,Quad PF,6+ Mos 11/14/2014, 02/19/2016, 02/18/2017   Moderna SARS-COV2 Booster Vaccination 12/26/2019   Moderna Sars-Covid-2 Vaccination 04/22/2019, 05/20/2019   Pneumococcal Conjugate-13 11/14/2014, 02/05/2020   Pneumococcal Polysaccharide-23 11/07/2007, 02/19/2016, 03/03/2018   Td 09/06/2008       Objective:   Physical Exam BP 112/68 (BP Location: Left Arm, Cuff Size: Normal)   Pulse 85   Temp 98.2 F (36.8 C)   Ht 5\' 8"  (1.727 m)   Wt 113 lb 9.6 oz (51.5 kg)   SpO2 97%   BMI 17.27 kg/m   SpO2: 97 % O2 Device: Nasal cannula O2 Flow Rate (L/min):  2 L/min O2 Type: Continuous O2  GENERAL: Thin, well-developed male, no overt respiratory distress, fully ambulatory. HEAD: Normocephalic, atraumatic. EYES: Pupils equal, round, reactive to light.  No scleral icterus. MOUTH: Nose/mouth/throat not examined due to masking requirements for COVID 19. NECK: Supple. No thyromegaly. No nodules. No JVD.  Trachea midline PULMONARY: Symmetrical air entry, mild accessory muscle use noted today, diffuse wheezes noted, no other adventitious sounds, breath sounds are coarse.  Positive Hoover sign. CARDIOVASCULAR: S1 and S2. Regular rate and rhythm.  No overt murmurs, gallops or rubs noted. GASTROINTESTINAL: No distention.  Benign. MUSCULOSKELETAL: No joint deformity, no clubbing, no edema. NEUROLOGIC: No focal deficits, fluent speech, awake, alert. SKIN: Intact,warm,dry.  No rashes noted on limited exam. PSYCH: Normal mood and behavior  Patient received nebulization treatment with albuterol solution.  Post nebulization treatment exam showed: Improved air entry bilaterally,  decreased wheezing.  Patient noted relief.     Assessment & Plan:     ICD-10-CM   1. Stage 3 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 (HCC)  J44.9 DG Chest 2 View   Continue Breztri 2 puffs twice a day Continue as needed albuterol Continue Daliresp and theophylline    2. COPD with acute exacerbation (HCC)  J44.1 albuterol (PROVENTIL) (2.5 MG/3ML) 0.083% nebulizer solution 2.5 mg    CBC With Differential    DG Chest 2 View   Medrol Dosepak Doxycycline 100 mg twice daily x 7 days    3. Chronic respiratory failure with hypoxia (HCC)  J96.11    Continue oxygen at 2 L/min    4. Wheezing  R06.2 albuterol (PROVENTIL) (2.5 MG/3ML) 0.083% nebulizer solution 2.5 mg    DG Chest 2 View   Patient received nebulization treatment Improvement of symptoms after nebulization treatment    5. Tobacco dependence due to cigarettes  F17.210    Patient was counseled regards to discontinuation of smoking Total counseling time 3 to 5 minutes    6. Encounter for medication monitoring  Z51.81 Theophylline level   Theophylline level today     Orders Placed This Encounter  Procedures   DG Chest 2 View    Standing Status:   Future    Standing Expiration Date:   07/06/2023    Order Specific Question:   Reason for Exam (SYMPTOM  OR DIAGNOSIS REQUIRED)    Answer:   SOB, wheezing and cough    Order Specific Question:   Preferred imaging location?    Answer:   [facility]   CBC With Differential    Standing Status:   Future    Standing Expiration Date:   01/06/2023   Theophylline level    Standing Status:   Future    Standing Expiration Date:   01/06/2023   Meds ordered this encounter  Medications   albuterol (PROVENTIL) (2.5 MG/3ML) 0.083% nebulizer solution 2.5 mg   methylPREDNISolone (MEDROL DOSEPAK) 4 MG TBPK tablet    Sig: Take as directed in the package.    Dispense:  21 tablet    Refill:  0   doxycycline (MONODOX) 100 MG capsule    Sig: Take 1 capsule (100 mg total) by mouth 2 (two) times  daily for 7 days.    Dispense:  14 capsule    Refill:  0   We will see the patient in 4 to 6 weeks time he is to contact us prior to that time should any new difficulties arise.  Gailen Shelter, MD Advanced Bronchoscopy PCCM [facility]    *This note  was dictated using voice recognition software/Dragon.  Despite best efforts to proofread, errors can occur which can change the meaning. Any transcriptional errors that result from this process are unintentional and may not be fully corrected at the time of dictation.

## 2022-07-06 NOTE — Patient Instructions (Signed)
We have sent some prednisone like medication and an antibiotic to your pharmacy.  We are checking some blood work today.  We are checking a chest x-ray to make sure you do not have pneumonia.  We will see you in follow-up in 4 to 6 weeks time call sooner should any new problems arise.

## 2022-07-08 LAB — THEOPHYLLINE LEVEL: Theophylline Lvl: 1.2 ug/mL — ABNORMAL LOW (ref 10.0–20.0)

## 2022-07-14 ENCOUNTER — Telehealth: Payer: Medicare HMO

## 2022-07-16 ENCOUNTER — Other Ambulatory Visit: Payer: Self-pay | Admitting: Internal Medicine

## 2022-07-16 NOTE — Telephone Encounter (Signed)
Requested medications are due for refill today.  unsure  Requested medications are on the active medications list.  yes  Last refill. 06/16/2022 #90 0 rf  Future visit scheduled.   no  Notes to clinic.  Refill not delegated.    Requested Prescriptions  Pending Prescriptions Disp Refills   ALPRAZolam (XANAX) 0.5 MG tablet [Pharmacy Med Name: ALPRAZOLAM 0.5MG  TABLETS] 90 tablet     Sig: TAKE 1 TABLET(0.5 MG) BY MOUTH THREE TIMES DAILY     Not Delegated - Psychiatry: Anxiolytics/Hypnotics 2 Failed - 07/16/2022  9:07 AM      Failed - This refill cannot be delegated      Failed - Urine Drug Screen completed in last 360 days      Passed - Patient is not pregnant      Passed - Valid encounter within last 6 months    Recent Outpatient Visits           4 months ago Encounter for annual general medical examination with abnormal findings in adult   [facility] [place], Salvadore Oxford, NP   4 months ago Stage 3 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 [facility])   [facility] Delles, Gentry Fitz A, RPH-CPP   6 months ago Stage 3 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 [facility])   [place] Delles, Gentry Fitz A, RPH-CPP   10 months ago Aortic atherosclerosis [facility])   [place] [facility] [place], Salvadore Oxford, NP   1 year ago Encounter for general adult medical examination with abnormal findings   [place] [place], Salvadore Oxford, NP       Future Appointments             In 1 month Glenford Bayley, NP [place] [facility] [place]   In 1 month Charlsie Quest, NP [facility]

## 2022-08-01 ENCOUNTER — Encounter: Payer: Self-pay | Admitting: Pulmonary Disease

## 2022-08-06 ENCOUNTER — Ambulatory Visit: Payer: Medicare HMO | Admitting: Pharmacist

## 2022-08-06 DIAGNOSIS — J449 Chronic obstructive pulmonary disease, unspecified: Secondary | ICD-10-CM

## 2022-08-06 DIAGNOSIS — I10 Essential (primary) hypertension: Secondary | ICD-10-CM

## 2022-08-06 NOTE — Progress Notes (Unsigned)
08/06/2022 Name: Elijah Mcfarland MRN: [id number] DOB: 06-Jul-1950  Chief Complaint  Patient presents with   Medication Assistance   Medication Management    Elijah Mcfarland is a 72 y.o. year old male who presented for a telephone visit.   They were referred to the pharmacist by their PCP for assistance in managing medication access.      Subjective:   Care Team: Primary Care Provider: Lorre Munroe, NP Pulmonologist: Salena Saner, MD; Next Scheduled Visit: 08/27/2022 Cardiologist: [facility] [place]; Next Scheduled Visit: 09/02/2022  Medication Access/Adherence  Current Pharmacy:  [id number] - [place], [place] - [address] [place] [address] Phone: [phone] Fax: [phone]  [facility] - [place], [place] - [address] [place] [address] Phone: [phone] Fax: [phone]  [facility] - [place], [place] - [address] [place] [address] Phone: [phone] Fax: [phone]   Patient reports affordability concerns with their medications: No  Patient reports access/transportation concerns to their pharmacy: No  Patient reports adherence concerns with their medications:  No       COPD: Patient followed by [facility] Current treatment: Breztri - 2 puffs twice daily Albuterol as needed Confirms using Breztri inhaler twice daily (morning and evening) and rinsing mouth out after use   Current medication access: enrolled in [place] patient assistance from [employer]Me through 02/15/2023   Hypertension/HFpEF:  Current medications:  ACEi/ARB/ARNI: losartan 50 mg - 1/2 tablet (25 mg) daily Diuretic regimen: furosemide 20 mg daily as needed Denies needing furosemide recently  Reports monitors home blood pressure, but denies keeping record/does not recall recent  readings   Hyperlipidemia/ASCVD Risk Reduction  Current lipid lowering medications: atorvastatin 40 mg daily  Antiplatelet regimen: aspirin 81 mg daily  ASCVD History: Mild nonobstructive coronary artery disease found on coronary CTA with 25-49% in the proximal RCA on 05/27/2022    Objective:   Lab Results  Component Value Date   CREATININE 0.71 05/05/2022   BUN 9 05/05/2022   NA 139 05/05/2022   K 3.8 05/05/2022   CL 103 05/05/2022   CO2 27 05/05/2022    Lab Results  Component Value Date   CHOL 178 03/15/2022   HDL 64 03/15/2022   LDLCALC 97 03/15/2022   TRIG 82 03/15/2022   CHOLHDL 2.8 03/15/2022   BP Readings from Last 3 Encounters:  07/06/22 112/68  06/03/22 118/70  05/27/22 113/76   Pulse Readings from Last 3 Encounters:  07/06/22 85  06/03/22 71  05/27/22 64     Medications Reviewed Today     Reviewed by Salena Saner, MD (Physician) on 08/01/22 at 1728  Med List Status: <None>   Medication Order Taking? Sig Documenting Provider Last Dose Status Informant  acetaminophen (TYLENOL) 325 MG tablet [id number] Yes Take 2 tablets (650 mg total) by mouth every 6 (six) hours as needed for mild pain or headache (or Fever >/= 101). Shon Hale, MD Taking Active Self  albuterol (PROVENTIL) (2.5 MG/3ML) 0.083% nebulizer solution [id number]  Take 3 mLs (2.5 mg total) by nebulization every 6 (six) hours as needed for wheezing or shortness of breath. Salena Saner, MD  Active   albuterol (VENTOLIN HFA) 108 (90 Base) MCG/ACT inhaler [id number] Yes INHALE 2 PUFFS INTO THE LUNGS EVERY 6 HOURS AS NEEDED FOR WHEEZING OR SHORTNESS OF BREATH Salena Saner,  MD Taking Active   ALPRAZolam Prudy Feeler) 0.5 MG tablet [id number]  TAKE 1 TABLET(0.5 MG) BY MOUTH THREE TIMES DAILY Lorre Munroe, NP  Active   aspirin EC 81 MG tablet [id number]  Take 1 tablet (81 mg total) by mouth daily. Swallow whole. Debbe Odea, MD  Active   atorvastatin (LIPITOR) 40 MG tablet  [id number]  Take 1 tablet (40 mg total) by mouth daily. Debbe Odea, MD  Active   Budeson-Glycopyrrol-Formoterol (BREZTRI AEROSPHERE) 160-9-4.8 MCG/ACT Sandrea Matte [id number] Yes Inhale 2 puffs into the lungs in the morning and at bedtime. Salena Saner, MD Taking Active   citalopram (CELEXA) 40 MG tablet [id number]  TAKE 1 TABLET EVERY DAY Baity, Salvadore Oxford, NP  Active   Ferrous Sulfate (IRON) 325 (65 Fe) MG TABS [id number]  Take 1 tablet by mouth in the morning and at bedtime. [provider]  Active   furosemide (LASIX) 20 MG tablet [id number]  Take 1 tablet (20 mg total) by mouth daily as needed. Debbe Odea, MD  Active   levocetirizine (XYZAL) 5 MG tablet [id number]  Take 5 mg by mouth every evening. [provider]  Active   losartan (COZAAR) 50 MG tablet [id number]  TAKE 1/2 TABLET(25 MG) BY MOUTH DAILY Baity, Salvadore Oxford, NP  Active   methylPREDNISolone (MEDROL DOSEPAK) 4 MG TBPK tablet [id number]  Take as directed in the package. Salena Saner, MD  Active   metoprolol tartrate (LOPRESSOR) 50 MG tablet [id number]  Take 1 tablet (50 mg total) by mouth once for 1 dose. TWO HOURS PRIOR TO CARDIAC CTA Debbe Odea, MD  Expired 05/05/22 2359   Nutritional Supplements (NUTRITIONAL SUPPLEMENT PO) [id number] Yes Take by mouth. Super Beta Prostate Advanced [provider] Taking Active   OXYGEN [id number] Yes Inhale into the lungs at bedtime. [provider] Taking Active Self  phenytoin (DILANTIN) 100 MG ER capsule [id number]  TAKE 3 CAPSULES EVERY DAY Baity, Salvadore Oxford, NP  Active   roflumilast (DALIRESP) 500 MCG TABS tablet [id number]  Take 1 tablet (500 mcg total) by mouth daily. Salena Saner, MD  Active   tamsulosin [facility]) 0.4 MG CAPS capsule [id number]  TAKE 1 CAPSULE(0.4 MG) BY MOUTH DAILY Baity, Salvadore Oxford, NP  Active   theophylline (THEODUR) 100 MG 12 hr tablet [id number]  Take 1 tablet (100 mg total) by mouth 2 (two) times daily. Salena Saner,  MD  Active   Med List Note Roena Malady, [place] 08/07/19 8182): UDS/CSA 07/06/19 repeat 10/06/2019              Assessment/Plan:   Hypertension/HFpEF:  Encourage patient to continue to continue to monitor home blood pressure, to start keeping a log of these BP/HR readings and to bring this record with him to medical appointments  COPD: - Patient to follow up with AZ&Me program as needed for Breztri refills - Encourage patient to continue to work toward smoking cessation     Follow Up Plan: Clinical Pharmacist will outreach to patient by telephone on 12/20/2022 at 2:30 PM    Estelle Grumbles, PharmD, [employer] Clinical Pharmacist [facility] [phone]

## 2022-08-08 NOTE — Patient Instructions (Signed)
Goals Addressed             This Visit's Progress    Pharmacy Goals       Please use your Breztri 2 puffs twice a day.  Make sure you rinse your mouth well after you use it.  You can contact AZ[employer]Me at [phone] to follow up about refills of Breztri  Please consider calling the [place] Quitline for their help with quitting smoking.  The [place] Quitline phone number is: [phone]  Feel free to call me with any questions or concerns. I look forward to our next call!  Estelle Grumbles, PharmD, [facility] Clinical Pharmacist [facility] [phone]

## 2022-08-16 ENCOUNTER — Other Ambulatory Visit: Payer: Self-pay | Admitting: Internal Medicine

## 2022-08-17 NOTE — Telephone Encounter (Signed)
Requested medication (s) are due for refill today: yes  Requested medication (s) are on the active medication list: yes  Last refill:  07/16/22 #90/0  Future visit scheduled: yes  Notes to clinic:  Unable to refill per protocol, cannot delegate.    Requested Prescriptions  Pending Prescriptions Disp Refills   ALPRAZolam (XANAX) 0.5 MG tablet [Pharmacy Med Name: ALPRAZOLAM 0.5MG  TABLETS] 90 tablet     Sig: TAKE 1 TABLET(0.5 MG) BY MOUTH THREE TIMES DAILY     Not Delegated - Psychiatry: Anxiolytics/Hypnotics 2 Failed - 08/16/2022  9:45 AM      Failed - This refill cannot be delegated      Failed - Urine Drug Screen completed in last 360 days      Passed - Patient is not pregnant      Passed - Valid encounter within last 6 months    Recent Outpatient Visits           1 week ago Stage 3 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 (HCC)   [place] Delles, Gentry Fitz A, RPH-CPP   5 months ago Encounter for annual general medical examination with abnormal findings in adult   [facility] [place], Salvadore Oxford, NP   5 months ago Stage 3 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 [facility])   [facility] [facility] Delles, Gentry Fitz A, RPH-CPP   7 months ago Stage 3 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 [facility])   [place] [facility] Delles, Gentry Fitz A, RPH-CPP   11 months ago Aortic atherosclerosis [facility])   [facility] [place], Salvadore Oxford, NP       Future Appointments             In 1 week Glenford Bayley, NP [place] [facility] [place]   In 2 weeks Charlsie Quest, NP [place] [facility] [place]   In 1 month [place], Salvadore Oxford, NP  [facility]

## 2022-08-27 ENCOUNTER — Other Ambulatory Visit
Admission: RE | Admit: 2022-08-27 | Discharge: 2022-08-27 | Disposition: A | Discharge status: Home / Self Care | Payer: Medicare HMO | Source: Ambulatory Visit | Attending: Primary Care | Admitting: Primary Care

## 2022-08-27 ENCOUNTER — Encounter: Payer: Self-pay | Admitting: Primary Care

## 2022-08-27 ENCOUNTER — Ambulatory Visit: Payer: Medicare HMO | Admitting: Primary Care

## 2022-08-27 ENCOUNTER — Other Ambulatory Visit: Admission: RE | Admit: 2022-08-27 | Payer: Medicare HMO | Source: Home / Self Care

## 2022-08-27 VITALS — BP 110/66 | HR 61 | Temp 98.2°F | Ht 68.5 in | Wt 115.2 lb

## 2022-08-27 DIAGNOSIS — Z720 Tobacco use: Secondary | ICD-10-CM | POA: Diagnosis not present

## 2022-08-27 DIAGNOSIS — J9611 Chronic respiratory failure with hypoxia: Secondary | ICD-10-CM | POA: Insufficient documentation

## 2022-08-27 DIAGNOSIS — F17200 Nicotine dependence, unspecified, uncomplicated: Secondary | ICD-10-CM | POA: Diagnosis not present

## 2022-08-27 DIAGNOSIS — I2510 Atherosclerotic heart disease of native coronary artery without angina pectoris: Secondary | ICD-10-CM | POA: Insufficient documentation

## 2022-08-27 DIAGNOSIS — J449 Chronic obstructive pulmonary disease, unspecified: Secondary | ICD-10-CM

## 2022-08-27 DIAGNOSIS — J4489 Other specified chronic obstructive pulmonary disease: Secondary | ICD-10-CM | POA: Diagnosis not present

## 2022-08-27 LAB — HEPATIC FUNCTION PANEL
ALT: 12 U/L (ref 0–44)
AST: 18 U/L (ref 15–41)
Albumin: 3.6 g/dL (ref 3.5–5.0)
Alkaline Phosphatase: 70 U/L (ref 38–126)
Bilirubin, Direct: <0.1 mg/dL (ref 0.0–0.2)
Total Bilirubin: 0.5 mg/dL (ref 0.3–1.2)
Total Protein: 6.5 g/dL (ref 6.5–8.1)

## 2022-08-27 LAB — LIPID PANEL
Cholesterol: 164 mg/dL (ref 0–200)
HDL: 55 mg/dL (ref >40)
LDL Cholesterol: 83 mg/dL (ref 0–99)
Total CHOL/HDL Ratio: 3 RATIO
Triglycerides: 129 mg/dL (ref <150)
VLDL: 26 mg/dL (ref 0–40)

## 2022-08-27 NOTE — Assessment & Plan Note (Signed)
-   Stable; Continue 2L oxygen 24/7 to maintain O2 >88-90%

## 2022-08-27 NOTE — Assessment & Plan Note (Addendum)
-   Treated for AECOPD in May, symptoms returned to baseline after completing medrol dose pack and course of Doxycycline. He is doing well today without acute complaints. Cough is less productive and wheezing has improved. Unclear if he is taking Daliresp or Theophylline, neither are currently active on his medication list. Cost may have been an issue. Theophylline level was low. Eosinophils elevated, needs RAST allergy testing. Consider adding Singulair to therapy.

## 2022-08-27 NOTE — Assessment & Plan Note (Addendum)
-   Continue to encourage smoking cessation, continue Nicotine patch 14 grams daily

## 2022-08-27 NOTE — Progress Notes (Signed)
@Patient  ID: Elijah Mcfarland, male    DOB: 23-Sep-1950, 72 y.o.   MRN: [id number]  Chief Complaint  Patient presents with   Follow-up    Referring provider: Lorre Munroe, NP  HPI: 72 year male, current smoker.  Past medical history significant for severe COPD, chronic respiratory failure, hypertension, aortic arthrosclerosis, GERD, arthritis, hyperlipidemia, macular degeneration, prediabetes, tobacco abuse.  08/27/2022 Patient presents today for 6 week follow-up COPD exacerbation. He was last seen in May by Dr. Jayme Cloud and treated for acute COPD exacerbation with medrol Dosepak and doxycycline. He is feeling much better. Breathing has returned to baseline. Cough is less productive and wheezing has improved. Maintained on Breztri Aerosphere 2 puffs twice daily. He is using SABA 1-2 times a day. He is not taking Daliresp or theophylline for the sounds of it. He tells me a medication that was supposed to be used to break up congestion was too expensive. He is unsure name of medication. Theophylline level was low, this medication is not on his medicatin list. Maintained on 2L oxygen 24/7, no increased demands. Smoking about the same amount, 1/2 pack daily. He has tried chantix before but didn't tolerate medication. He is using nicotine gum along with patches some days. Eosinophils were elevated, needs RAST allergy panel.   Allergies  Allergen Reactions   Nsaids Other (See Comments)    GI bleed and vomiting    Immunization History  Administered Date(s) Administered   Fluad Quad(high Dose 65+) 11/17/2018, 02/05/2020, 03/10/2021, 03/15/2022   H1N1 01/05/2008   Influenza Whole 11/07/2007   Influenza, High Dose Seasonal PF 03/03/2018   Influenza, Seasonal, Injecte, Preservative Fre 04/13/2013   Influenza,inj,Quad PF,6+ Mos 11/14/2014, 02/19/2016, 02/18/2017   Moderna SARS-COV2 Booster Vaccination 12/26/2019   Moderna Sars-Covid-2 Vaccination 04/22/2019, 05/20/2019   Pneumococcal  Conjugate-13 11/14/2014, 02/05/2020   Pneumococcal Polysaccharide-23 11/07/2007, 02/19/2016, 03/03/2018   Td 09/06/2008    Past Medical History:  Diagnosis Date   Chronic pain syndrome    Left knee   COPD (chronic obstructive pulmonary disease) (HCC)    DDD (degenerative disc disease), lumbar 11/2008   L5-S1 on plain film   Emphysema lung (HCC)    GAD (generalized anxiety disorder)    GERD (gastroesophageal reflux disease)    History of multiple pulmonary nodules 07/2010   Follow up CT scans showed resolution by 03/2011--NO MALIGNANCY.   History of pneumonia 06/2010   HTN (hypertension) 01/07/2014   Osteoarthritis of left knee    + past injury of this knee--tibia fracture?  +left leg shorter than right   Seizure (HCC)    Seizure disorder (HCC)    3 total seizures (first was 1995); neuro eval done and recommended lifetime phenytoin (he had a seizure after coming off the med in the late 90s   Tobacco dependence     Tobacco History: Social History   Tobacco Use  Smoking Status Every Day   Current packs/day: 0.75   Average packs/day: 0.8 packs/day for 51.0 years (38.3 ttl pk-yrs)   Types: Cigarettes  Smokeless Tobacco Never  Tobacco Comments   10 cigarettes daily--08/27/2022 hfb   Ready to quit: Not Answered Counseling given: Not Answered Tobacco comments: 10 cigarettes daily--08/27/2022 hfb   Outpatient Medications Prior to Visit  Medication Sig Dispense Refill   acetaminophen (TYLENOL) 325 MG tablet Take 2 tablets (650 mg total) by mouth every 6 (six) hours as needed for mild pain or headache (or Fever >/= 101). 30 tablet 2   albuterol (PROVENTIL) (2.5 MG/3ML) 0.083%  nebulizer solution Take 3 mLs (2.5 mg total) by nebulization every 6 (six) hours as needed for wheezing or shortness of breath. 75 mL 12   albuterol (VENTOLIN HFA) 108 (90 Base) MCG/ACT inhaler INHALE 2 PUFFS INTO THE LUNGS EVERY 6 HOURS AS NEEDED FOR WHEEZING OR SHORTNESS OF BREATH 20.1 g 0   ALPRAZolam (XANAX)  0.5 MG tablet TAKE 1 TABLET(0.5 MG) BY MOUTH THREE TIMES DAILY 90 tablet 0   aspirin EC 81 MG tablet Take 1 tablet (81 mg total) by mouth daily. Swallow whole. 90 tablet 3   atorvastatin (LIPITOR) 40 MG tablet Take 1 tablet (40 mg total) by mouth daily. 90 tablet 3   Budeson-Glycopyrrol-Formoterol (BREZTRI AEROSPHERE) 160-9-4.8 MCG/ACT AERO Inhale 2 puffs into the lungs in the morning and at bedtime. 32.1 g 3   citalopram (CELEXA) 40 MG tablet TAKE 1 TABLET EVERY DAY 90 tablet 1   Ferrous Sulfate (IRON) 325 (65 Fe) MG TABS Take 1 tablet by mouth in the morning and at bedtime.     furosemide (LASIX) 20 MG tablet Take 1 tablet (20 mg total) by mouth daily as needed. 90 tablet 0   levocetirizine (XYZAL) 5 MG tablet Take 5 mg by mouth every evening.     losartan (COZAAR) 50 MG tablet TAKE 1/2 TABLET(25 MG) BY MOUTH DAILY 45 tablet 0   Nutritional Supplements (NUTRITIONAL SUPPLEMENT PO) Take by mouth. Super Beta Prostate Advanced     OXYGEN Inhale into the lungs at bedtime.     phenytoin (DILANTIN) 100 MG ER capsule TAKE 3 CAPSULES EVERY DAY 270 capsule 10   tamsulosin (FLOMAX) 0.4 MG CAPS capsule TAKE 1 CAPSULE(0.4 MG) BY MOUTH DAILY 90 capsule 0   No facility-administered medications prior to visit.    Review of Systems  Review of Systems  Constitutional: Negative.   HENT: Negative.    Respiratory:  Positive for cough and wheezing. Negative for chest tightness and shortness of breath.    Physical Exam  BP 110/66 (BP Location: Left Arm, Patient Position: Sitting, Cuff Size: Normal)   Pulse 61   Temp 98.2 F (36.8 C) (Oral)   Ht 5' 8.5" (1.74 m)   Wt 115 lb 3.2 oz (52.3 kg)   SpO2 93%   BMI 17.26 kg/m  Physical Exam Constitutional:      General: He is not in acute distress.    Appearance: Normal appearance. He is not ill-appearing.  HENT:     Head: Normocephalic and atraumatic.  Cardiovascular:     Rate and Rhythm: Normal rate and regular rhythm.  Pulmonary:     Effort:  Pulmonary effort is normal.     Breath sounds: Wheezing present. No rhonchi or rales.     Comments: 2L Neurological:     General: No focal deficit present.     Mental Status: He is alert and oriented to person, place, and time. Mental status is at baseline.  Psychiatric:        Mood and Affect: Mood normal.        Behavior: Behavior normal.        Thought Content: Thought content normal.        Judgment: Judgment normal.      Lab Results:  CBC    Component Value Date/Time   WBC 10.1 07/06/2022 1629   RBC 4.19 (L) 07/06/2022 1629   HGB 13.1 07/06/2022 1629   HCT 39.1 07/06/2022 1629   PLT 190 07/06/2022 1629   MCV 93.3 07/06/2022 1629  MCH 31.3 07/06/2022 1629   MCHC 33.5 07/06/2022 1629   RDW 13.9 07/06/2022 1629   LYMPHSABS 1.9 07/06/2022 1629   MONOABS 0.8 07/06/2022 1629   EOSABS 1.2 (H) 07/06/2022 1629   BASOSABS 0.1 07/06/2022 1629    BMET    Component Value Date/Time   NA 139 05/05/2022 1512   K 3.8 05/05/2022 1512   CL 103 05/05/2022 1512   CO2 27 05/05/2022 1512   GLUCOSE 98 05/05/2022 1512   BUN 9 05/05/2022 1512   CREATININE 0.71 05/05/2022 1512   CREATININE 0.67 (L) 03/15/2022 1445   CALCIUM 9.0 05/05/2022 1512   GFRNONAA >60 05/05/2022 1512   GFRAA >60 03/10/2018 0506    BNP No results found for: "BNP"  ProBNP No results found for: "PROBNP"  Imaging: No results found.   Assessment & Plan:   Stage 3 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 (HCC) - Treated for AECOPD in May, symptoms returned to baseline after completing medrol dose pack and course of Doxycycline. He is doing well today without acute complaints. Cough is less productive and wheezing has improved. Unclear if he is taking Daliresp or Theophylline, neither are currently active on his medication list. Cost may have been an issue. Theophylline level was low. Eosinophils elevated, needs RAST allergy testing. Consider adding Singulair to therapy.   Chronic respiratory failure with  hypoxia (HCC) - Stable; Continue 2L oxygen 24/7 to maintain O2 >88-90%  Tobacco abuse - Continue to encourage smoking cessation, continue Nicotine patch 14 grams daily    Glenford Bayley, NP 08/27/2022

## 2022-08-27 NOTE — Patient Instructions (Signed)
Recommendations: - Continue Breztri 2 puffs morning and evening - Use albuterol rescue inhaler every 6 hours as needed for breakthrough shortness of breath or wheezing - Continue 2L oxygen 24/7  - Please check to see whether or not you are taking theophylline  Orders: - Labs today  Follow-up: - 3 months with Dr. Jayme Cloud

## 2022-08-30 ENCOUNTER — Telehealth: Payer: Self-pay | Admitting: Primary Care

## 2022-08-30 LAB — ALPHA-1-ANTITRYPSIN PHENOTYP: A-1 Antitrypsin, Ser: 184 mg/dL (ref 101–187)

## 2022-08-30 LAB — MISC LABCORP TEST (SEND OUT): Labcorp test code: 607111

## 2022-08-30 NOTE — Progress Notes (Signed)
Agree with the details of the visit as noted by Elizabeth Walsh, NP.  C. Laura Gonzalez, MD [place] [facility] 

## 2022-08-30 NOTE — Telephone Encounter (Signed)
I asked patient to confirm he was taking theophylline on his AVS, can we please call him to follow-up on this. What is the dose and who prescribed last?

## 2022-08-31 NOTE — Telephone Encounter (Signed)
I dont think he is taking either, just FYI for his follow-up in October Margie ask him to bring medications in for next visit. See [place] sooner if having any issues

## 2022-08-31 NOTE — Telephone Encounter (Signed)
Spoke to both patient and patient's spouse, Joan(DPR). Both patient and Aurea Graff were unsure if patient is taking Daliresp or Theophylline. I asked Aurea Graff to look at each medication bottle. She did not see a bottle for Daliresp or Theophylline. I asked Aurea Graff if she could bring all medication bottles by our office so that I could verify and she stated that she could not.   Routing to [employer] as an Financial planner.

## 2022-08-31 NOTE — Telephone Encounter (Signed)
Spoke to patient and relayed below message. He will bring all meds to next appointment.  Nothing further needed.

## 2022-09-02 ENCOUNTER — Ambulatory Visit: Payer: Medicare HMO | Attending: Cardiology | Admitting: Cardiology

## 2022-09-02 ENCOUNTER — Encounter: Payer: Self-pay | Admitting: Cardiology

## 2022-09-02 VITALS — BP 118/78 | HR 86 | Ht 68.0 in | Wt 112.4 lb

## 2022-09-02 DIAGNOSIS — I10 Essential (primary) hypertension: Secondary | ICD-10-CM | POA: Diagnosis not present

## 2022-09-02 DIAGNOSIS — F17200 Nicotine dependence, unspecified, uncomplicated: Secondary | ICD-10-CM

## 2022-09-02 DIAGNOSIS — J449 Chronic obstructive pulmonary disease, unspecified: Secondary | ICD-10-CM | POA: Diagnosis not present

## 2022-09-02 DIAGNOSIS — I5030 Unspecified diastolic (congestive) heart failure: Secondary | ICD-10-CM

## 2022-09-02 DIAGNOSIS — E782 Mixed hyperlipidemia: Secondary | ICD-10-CM

## 2022-09-02 DIAGNOSIS — I2510 Atherosclerotic heart disease of native coronary artery without angina pectoris: Secondary | ICD-10-CM

## 2022-09-02 NOTE — Progress Notes (Signed)
Cardiology Office Note:  .   Date:  09/02/2022  ID:  Elijah Mcfarland, DOB Jul 04, 1950, MRN [id number] PCP: Lorre Munroe, NP  [facility] Providers Cardiologist:  Debbe Odea, MD    History of Present Illness: .   Elijah Mcfarland is a 72 y.o. male with a past medical history of hypertension, hyperlipidemia, current smoker x 40+ years, COPD on chronic oxygen therapy of 2 L, who presents today for follow-up.  He was previously seen in clinic 05/05/2022 with complaints of shortness of breath on exertion for quite some time that he attributed to his COPD.  He recently just been followed by pulmonary medicine with an echocardiogram being ordered that showed a grade 2 diastolic dysfunction.  He had endorsed occasional chest discomfort and chest pressure described as a tightness relieved with aspirin and albuterol.  He also had occasional peripheral edema.  He was then started on furosemide 20 mg daily as needed for swelling or weight gain and worsening shortness of breath, and aspirin 81 mg daily, scheduled for coronary CTA to rule out obstructive disease.  CTA revealed a calcium score 436 which is 70th percentile for age and sex matched control, CAD-RADS 2.  9 mild obstructive coronary artery disease with consideration of nonatherosclerotic causes of chest pain or dyspnea.  It was also recommended to consider preventative therapy and risk factor modification.  He was last seen in clinic 06/03/2022 where overall he had been doing well.  He continued to have dyspnea that he stated was chronic and was continued on oxygen therapy.  He did continue to follow-up with pulmonary.  His atorvastatin was increased to 40 mg daily at that time without incident and also was continued on aspirin 81 mg daily.  He returns to clinic today stating that overall he has been doing well.  He does stay out of the heat and humidity as it causes him to have worsening shortness of breath.  He has been maintained on 2 L of  O2 via nasal cannula.  His dyspnea on exertion has not changed in frequency or intensity.  He denies any chest discomfort, chest tightness, or palpitations.  Denies any peripheral edema, lightheadedness dizziness, changes in appetite or sleep.  States that he has been compliant with medication regimen.  Denies any hospitalizations or visits to the emergency department.  ROS: 10 point review of systems has been reviewed and considered negative with exception of what is been listed in the HPI  Studies Reviewed: Marland Kitchen        Coronary CTA 05/27/22 IMPRESSION: 1. Coronary calcium score of 436. This was 70th percentile for age and sex matched control.   2. Normal coronary origin with right dominance.   3. Mild proximal RCA stenosis (25-49%).   4. Minimal proximal LAD stenosis (<25%).   5. CAD-RADS 2. Mild non-obstructive CAD (25-49%). Consider non-atherosclerotic causes of chest pain or dyspnea. Consider preventive therapy and risk factor modification.   TTE 01/27/22  1. Left ventricular ejection fraction, by estimation, is 55 to 60%. Left  ventricular ejection fraction by 2D MOD biplane is 58.2 %. The left  ventricle has normal function. The left ventricle has no regional wall  motion abnormalities. Left ventricular  diastolic parameters are consistent with Grade II diastolic dysfunction  (pseudonormalization).   2. Right ventricular systolic function is normal. The right ventricular  size is normal.   3. The mitral valve is normal in structure. Mild to moderate mitral valve  regurgitation.   4. The aortic  valve is tricuspid. Aortic valve regurgitation is not  visualized.   5. The inferior vena cava is dilated in size with >50% respiratory  variability, suggesting right atrial pressure of 8 mmHg. Risk Assessment/Calculations:             Physical Exam:   VS:  BP 118/78 (BP Location: Left Arm, Patient Position: Sitting, Cuff Size: Normal)   Pulse 86   Ht 5\' 8"  (1.727 m)   Wt 112 lb  6.4 oz (51 kg)   SpO2 96%   BMI 17.09 kg/m    Wt Readings from Last 3 Encounters:  09/02/22 112 lb 6.4 oz (51 kg)  08/27/22 115 lb 3.2 oz (52.3 kg)  07/06/22 113 lb 9.6 oz (51.5 kg)    GEN: Well nourished, well developed in no acute distress NECK: No JVD; No carotid bruits CARDIAC: RRR, no murmurs, rubs, gallops RESPIRATORY: Coarse with expiratory wheezing to auscultation, patient's remain unlabored at rest on 2 L of O2 via nasal cannula ABDOMEN: Soft, non-tender, non-distended EXTREMITIES: Pretibial edema; No deformity   ASSESSMENT AND PLAN: .   HFpEF with G2 DD with an LVEF of 55-60%.  Continues to appear euvolemic on exam.  He is continued on furosemide 20 mg daily.  Patient's weight has remained stable.  He has not started on MRA today as his potassium is maintained on the higher side of normal.  Discussed possible initiation of SGLT2 inhibitor as he would like to hold off at this time.  Mild nonobstructive coronary artery disease found on coronary CTA.  Denies any chest discomfort.  Has been continued on aspirin 81 mg daily and atorvastatin 40 mg daily.  Send he has routine blood work coming up with his primary care provider will have his lipid and hepatic panel checked at that time.  If it is not done by his PCP will need and checked all return.  Primary hypertension with blood pressure today 118/78.  Blood pressures remained stable.  He is continued on furosemide 20 mg daily and losartan 25 mg daily.  Encouraged to continue to monitor his blood pressures at home 1 to 2 hours post medication administration.  Mixed hyperlipidemia with an LDL of 83.  Continues not at goal.  Can consider starting ezetimibe 10 mg daily on return.  COPD on chronic oxygen therapy of 2 L.  He continues to follow with pulmonary.  Has been continued on various inhalers.  Tobacco abuse is he continues to smoke and has smoked for 50+ years.  He has no interest in cessation even though total cessation is  recommended.       Dispo: Patient to return to clinic to see MD/APP in 3 months or sooner if needed with reevaluation of symptoms.  Signed, Lupie Sawa, NP

## 2022-09-02 NOTE — Patient Instructions (Signed)
Medication Instructions:  The current medical regimen is effective;  continue present plan and medications.  *If you need a refill on your cardiac medications before your next appointment, please call your pharmacy*  Follow-Up: At [facility], you and your health needs are our priority.  As part of our continuing mission to provide you with exceptional heart care, we have created designated Provider Care Teams.  These Care Teams include your primary Cardiologist (physician) and Advanced Practice Providers (APPs -  Physician Assistants and Nurse Practitioners) who all work together to provide you with the care you need, when you need it.  We recommend signing up for the patient portal called "MyChart".  Sign up information is provided on this After Visit Summary.  MyChart is used to connect with patients for Virtual Visits (Telemedicine).  Patients are able to view lab/test results, encounter notes, upcoming appointments, etc.  Non-urgent messages can be sent to your provider as well.   To learn more about what you can do with MyChart, go to [email or web address].    Your next appointment:   4 month(s)  Provider:   Debbe Odea, MD

## 2022-09-03 NOTE — Progress Notes (Signed)
Discussed at appointment.

## 2022-09-05 ENCOUNTER — Encounter: Payer: Self-pay | Admitting: Cardiology

## 2022-09-06 ENCOUNTER — Other Ambulatory Visit: Payer: Self-pay | Admitting: Internal Medicine

## 2022-09-06 DIAGNOSIS — I10 Essential (primary) hypertension: Secondary | ICD-10-CM

## 2022-09-07 NOTE — Telephone Encounter (Signed)
Requested Prescriptions  Pending Prescriptions Disp Refills   tamsulosin (FLOMAX) 0.4 MG CAPS capsule [Pharmacy Med Name: TAMSULOSIN 0.4MG  CAPSULES] 90 capsule 1    Sig: TAKE 1 CAPSULE(0.4 MG) BY MOUTH DAILY     Urology: Alpha-Adrenergic Blocker Passed - 09/06/2022  3:32 AM      Passed - PSA in normal range and within 360 days    PSA  Date Value Ref Range Status  03/15/2022 0.95 < OR = 4.00 ng/mL Final    Comment:    The total PSA value from this assay system is  standardized against the WHO standard. The test  result will be approximately 20% lower when compared  to the equimolar-standardized total PSA (Beckman  Coulter). Comparison of serial PSA results should be  interpreted with this fact in mind. . This test was performed using the Siemens  chemiluminescent method. Values obtained from  different assay methods cannot be used interchangeably. PSA levels, regardless of value, should not be interpreted as absolute evidence of the presence or absence of disease.   02/05/2020 0.92 0.10 - 4.00 ng/ml Final    Comment:    Test performed using Access Hybritech PSA Assay, a parmagnetic partical, chemiluminecent immunoassay.         Passed - Last BP in normal range    BP Readings from Last 1 Encounters:  09/02/22 118/78         Passed - Valid encounter within last 12 months    Recent Outpatient Visits           1 month ago Stage 3 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 [facility])   [place] [facility] Delles, Jackelyn Poling, RPH-CPP   5 months ago Encounter for annual general medical examination with abnormal findings in adult   [facility] [place], Salvadore Oxford, NP   6 months ago Stage 3 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 [facility])   [place] [facility] Delles, Gentry Fitz A, RPH-CPP   7 months ago Stage 3 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 [facility])   [place] [facility] Delles, Gentry Fitz A, RPH-CPP   11 months ago  Aortic atherosclerosis [facility])   [place] [facility] [place], Salvadore Oxford, NP       Future Appointments             In 1 week Sampson Si, Salvadore Oxford, NP [facility]   In 3 months Debbe Odea, MD [facility]             losartan (COZAAR) 50 MG tablet [Pharmacy Med Name: LOSARTAN 50MG  TABLETS] 45 tablet 1    Sig: TAKE 1/2 TABLET(25 MG) BY MOUTH DAILY     Cardiovascular:  Angiotensin Receptor Blockers Passed - 09/06/2022  3:32 AM      Passed - Cr in normal range and within 180 days    Creat  Date Value Ref Range Status  03/15/2022 0.67 (L) 0.70 - 1.35 mg/dL Final   Creatinine, Ser  Date Value Ref Range Status  05/05/2022 0.71 0.61 - 1.24 mg/dL Final   Creatinine,U  Date Value Ref Range Status  11/03/2012 46.42 mg/dL Final    Comment:       Cutoff Values for Urine Drug Screen, Pain Mgmt          Drug Class           Cutoff (ng/mL)  Amphetamines             500          Barbiturates             200          Cocaine Metabolites      150          Benzodiazepines          200          Methadone                300          Opiates                  300          Phencyclidine             25          Propoxyphene             300          Marijuana Metabolites     50    For medical purposes only.         Passed - K in normal range and within 180 days    Potassium  Date Value Ref Range Status  05/05/2022 3.8 3.5 - 5.1 mmol/L Final         Passed - Patient is not pregnant      Passed - Last BP in normal range    BP Readings from Last 1 Encounters:  09/02/22 118/78         Passed - Valid encounter within last 6 months    Recent Outpatient Visits           1 month ago Stage 3 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 [facility])   [facility] Delles, Jackelyn Poling, RPH-CPP   5 months ago Encounter for annual general medical examination with abnormal findings in adult   [facility] [place], Salvadore Oxford, NP   6 months ago Stage 3 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 [facility])   [place] [facility] Delles, Gentry Fitz A, RPH-CPP   7 months ago Stage 3 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 [facility])   [place] [facility] Delles, Gentry Fitz A, RPH-CPP   11 months ago Aortic atherosclerosis [facility])   [place] [facility] [place], Salvadore Oxford, NP       Future Appointments             In 1 week Sampson Si, Salvadore Oxford, NP [place] [facility], [facility]   In 3 months Azucena Cecil, Arlys John, MD [facility] at [facility]

## 2022-09-15 ENCOUNTER — Other Ambulatory Visit: Payer: Self-pay | Admitting: Internal Medicine

## 2022-09-16 NOTE — Telephone Encounter (Signed)
Requested medications are due for refill today.  yes  Requested medications are on the active medications list.  yes  Last refill. 7/2/204 #90 0 rf  Future visit scheduled.   yes  Notes to clinic.  Refill not delegated.    Requested Prescriptions  Pending Prescriptions Disp Refills   ALPRAZolam (XANAX) 0.5 MG tablet [Pharmacy Med Name: ALPRAZOLAM 0.5MG  TABLETS] 90 tablet     Sig: TAKE 1 TABLET(0.5 MG) BY MOUTH THREE TIMES DAILY     Not Delegated - Psychiatry: Anxiolytics/Hypnotics 2 Failed - 09/15/2022  9:05 AM      Failed - This refill cannot be delegated      Failed - Urine Drug Screen completed in last 360 days      Failed - Valid encounter within last 6 months    Recent Outpatient Visits           1 month ago Stage 3 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 [facility])   [place] [facility] Delles, Gentry Fitz A, RPH-CPP   6 months ago Encounter for annual general medical examination with abnormal findings in adult   [facility] [place], Salvadore Oxford, NP   6 months ago Stage 3 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 [facility])   [place] [facility] Delles, Gentry Fitz A, RPH-CPP   8 months ago Stage 3 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 [facility])   [place] [facility] Delles, Gentry Fitz A, RPH-CPP   1 year ago Aortic atherosclerosis [facility])   [facility] [place], Salvadore Oxford, NP       Future Appointments             In 4 days Franquez, Salvadore Oxford, NP [facility]   In 3 months Debbe Odea, MD [facility] - Patient is not pregnant

## 2022-09-20 ENCOUNTER — Encounter: Payer: Self-pay | Admitting: Internal Medicine

## 2022-09-20 ENCOUNTER — Ambulatory Visit (INDEPENDENT_AMBULATORY_CARE_PROVIDER_SITE_OTHER): Payer: Medicare HMO | Admitting: Internal Medicine

## 2022-09-20 VITALS — BP 128/64 | HR 95 | Temp 96.8°F | Wt 117.0 lb

## 2022-09-20 DIAGNOSIS — K2101 Gastro-esophageal reflux disease with esophagitis, with bleeding: Secondary | ICD-10-CM | POA: Diagnosis not present

## 2022-09-20 DIAGNOSIS — R3911 Hesitancy of micturition: Secondary | ICD-10-CM

## 2022-09-20 DIAGNOSIS — I700 Atherosclerosis of aorta: Secondary | ICD-10-CM | POA: Diagnosis not present

## 2022-09-20 DIAGNOSIS — I5032 Chronic diastolic (congestive) heart failure: Secondary | ICD-10-CM

## 2022-09-20 DIAGNOSIS — G40309 Generalized idiopathic epilepsy and epileptic syndromes, not intractable, without status epilepticus: Secondary | ICD-10-CM

## 2022-09-20 DIAGNOSIS — I10 Essential (primary) hypertension: Secondary | ICD-10-CM

## 2022-09-20 DIAGNOSIS — R7303 Prediabetes: Secondary | ICD-10-CM

## 2022-09-20 DIAGNOSIS — J9611 Chronic respiratory failure with hypoxia: Secondary | ICD-10-CM | POA: Diagnosis not present

## 2022-09-20 DIAGNOSIS — J449 Chronic obstructive pulmonary disease, unspecified: Secondary | ICD-10-CM

## 2022-09-20 DIAGNOSIS — F411 Generalized anxiety disorder: Secondary | ICD-10-CM

## 2022-09-20 DIAGNOSIS — M1990 Unspecified osteoarthritis, unspecified site: Secondary | ICD-10-CM

## 2022-09-20 DIAGNOSIS — N401 Enlarged prostate with lower urinary tract symptoms: Secondary | ICD-10-CM | POA: Diagnosis not present

## 2022-09-20 DIAGNOSIS — E440 Moderate protein-calorie malnutrition: Secondary | ICD-10-CM

## 2022-09-20 DIAGNOSIS — E782 Mixed hyperlipidemia: Secondary | ICD-10-CM

## 2022-09-20 DIAGNOSIS — H3530 Unspecified macular degeneration: Secondary | ICD-10-CM

## 2022-09-20 NOTE — Assessment & Plan Note (Signed)
Controlled on losartan Kidney function reviewed

## 2022-09-20 NOTE — Assessment & Plan Note (Signed)
Continue Tylenol as needed

## 2022-09-20 NOTE — Assessment & Plan Note (Signed)
Lipid profile reviewed Continue atorvastatin and aspirin

## 2022-09-20 NOTE — Assessment & Plan Note (Signed)
Deteriorated Continue breztri and albuterol Continue oxygen at bedtime He will continue to follow with pulmonology

## 2022-09-20 NOTE — Assessment & Plan Note (Signed)
Lipid profile reviewed Encouraged him to consume a low-fat diet Continue atorvastatin and aspirin

## 2022-09-20 NOTE — Assessment & Plan Note (Signed)
Continue buspirone and Xanax Support offered

## 2022-09-20 NOTE — Assessment & Plan Note (Signed)
No longer following with ophthalmology

## 2022-09-20 NOTE — Patient Instructions (Signed)
Smoking Tobacco Information, Adult Smoking tobacco can be harmful to your health. Tobacco contains a toxic colorless chemical called nicotine. Nicotine causes changes in your brain that make you want more and more. This is called addiction. This can make it hard to stop smoking once you start. Tobacco also has other toxic chemicals that can hurt your body and raise your risk of many cancers. Menthol or "lite" tobacco or cigarette brands are not safer than regular brands. How can smoking tobacco affect me? Smoking tobacco puts you at risk for: Cancer. Smoking is most commonly associated with lung cancer, but can also lead to cancer in other parts of the body. Chronic obstructive pulmonary disease (COPD). This is a long-term lung condition that makes it hard to breathe. It also gets worse over time. High blood pressure (hypertension), heart disease, stroke, heart attack, and lung infections, such as pneumonia. Cataracts. This is when the lenses in the eyes become clouded. Digestive problems. This may include peptic ulcers, heartburn, and gastroesophageal reflux disease (GERD). Oral health problems, such as gum disease, mouth sores, and tooth loss. Loss of taste and smell. Smoking also affects how you look and smell. Smoking may cause: Wrinkles. Yellow or stained teeth, fingers, and fingernails. Bad breath. Bad-smelling clothes and hair. Smoking tobacco can also affect your social life, because: It may be challenging to find places to smoke when away from home. Many workplaces, restaurants, hotels, and public places are tobacco-free. Smoking is expensive. This is due to the cost of tobacco and the long-term costs of treating health problems from smoking. Secondhand smoke may affect those around you. Secondhand smoke can cause lung cancer, breathing problems, and heart disease. Children of smokers have a higher risk for: Sudden infant death syndrome (SIDS). Ear infections. Lung infections. What  actions can I take to prevent health problems? Quit smoking  Do not start smoking. Quit if you already smoke. Do not replace cigarette smoking with vaping devices, such as e-cigarettes. Make a plan to quit smoking and commit to it. Look for programs to help you, and ask your health care provider for recommendations and ideas. Set a date and write down all the reasons you want to quit. Let your friends and family know you are quitting so they can help and support you. Consider finding friends who also want to quit. It can be easier to quit with someone else, so that you can support each other. Talk with your health care provider about using nicotine replacement medicines to help you quit. These include gum, lozenges, patches, sprays, or pills. If you try to quit but return to smoking, stay positive. It is common to slip up when you first quit, so take it one day at a time. Be prepared for cravings. When you feel the urge to smoke, chew gum or suck on hard candy. Lifestyle Stay busy. Take care of your body. Get plenty of exercise, eat a healthy diet, and drink plenty of water. Find ways to manage your stress, such as meditation, yoga, exercise, or time spent with friends and family. Ask your health care provider about having regular tests (screenings) to check for cancer. This may include blood tests, imaging tests, and other tests. Where to find support To get support to quit smoking, consider: Asking your health care provider for more information and resources. Joining a support group for people who want to quit smoking in your local community. There are many effective programs that may help you to quit. Calling the smokefree.gov counselor   helpline at 1-800-QUIT-NOW (1-800-784-8669). Where to find more information You may find more information about quitting smoking from: Centers for Disease Control and Prevention: cdc.gov/tobacco Smokefree.gov: smokefree.gov American Lung Association:  freedomfromsmoking.org Contact a health care provider if: You have problems breathing. Your lips, nose, or fingers turn blue. You have chest pain. You are coughing up blood. You feel like you will faint. You have other health changes that cause you to worry. Summary Smoking tobacco can negatively affect your health, the health of those around you, your finances, and your social life. Do not start smoking. Quit if you already smoke. If you need help quitting, ask your health care provider. Consider joining a support group for people in your local community who want to quit smoking. There are many effective programs that may help you to quit. This information is not intended to replace advice given to you by your health care provider. Make sure you discuss any questions you have with your health care provider. Document Revised: 01/27/2021 Document Reviewed: 01/27/2021 Elsevier Patient Education  2024 Elsevier Inc.  

## 2022-09-20 NOTE — Assessment & Plan Note (Signed)
Continue Dilantin 

## 2022-09-20 NOTE — Assessment & Plan Note (Signed)
Will check A1c at annual exam

## 2022-09-20 NOTE — Assessment & Plan Note (Signed)
-   Continue Flomax 

## 2022-09-20 NOTE — Assessment & Plan Note (Signed)
Encourage high protein, high calorie diet 

## 2022-09-20 NOTE — Progress Notes (Signed)
Subjective:    Patient ID: Elijah Mcfarland, male    DOB: December 23, 1950, 72 y.o.   MRN: [id number]  HPI  Patient presents to clinic today for 47-month follow-up of chronic conditions.  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  He no longer follows with ophthalmology.  CHF: He denies lower extremity edema but does have shortness of breath.  He is taking losartan but is only taking furosemide as needed.  Echo from 01/2019 reviewed.  He follows with cardiology.  Anxiety: Chronic, managed on escitalopram, buspirone and xanax.  He is not currently seeing a therapist.  He denies depression, SI/HI.  OA: Mainly in his shoulder, back and knees.  He takes tylenol as needed with good relief of symptoms.  He follows with orthopedics.  COPD: He denies chronic cough but does report shortness of breath.  He is taking breztri and albuterol as prescribed.  He wears oxygen at night or with exertion.  PFTs from 03/2013 reviewed.  He follows with pulmonology.  He does continue to smoke.  Seizure disorder: He denies recent seizures on Dilantin.  He no longer follows with neurology.  HTN: His BP today is 128/64.  He is taking losartan as prescribed.  ECG from 02/2018 reviewed.  Prediabetes: His last A1c was 5.1%, 08/2021.  He is not taking any oral diabetic medication at this time.  He does not check his sugars.  GERD: With history of GI bleed.  He no longer takes pantoprazole but take tums as needed.  Upper GI from 02/2020 reviewed.  HLD with aortic atherosclerosis: His last LDL was 83, triglycerides 469, 08/2022.  He denies myalgias on atorvastatin.  He is taking aspirin as well.  He tries to consume low-fat diet.  BPH: He reports mainly urinary urgency, hesitancy and incontinence.  He is taking flomax as prescribed.  He does not follow with urology.  Review of Systems     Past Medical History:  Diagnosis Date   Chronic pain syndrome    Left knee   COPD (chronic obstructive pulmonary disease) (HCC)    DDD  (degenerative disc disease), lumbar 11/2008   L5-S1 on plain film   Emphysema lung (HCC)    GAD (generalized anxiety disorder)    GERD (gastroesophageal reflux disease)    History of multiple pulmonary nodules 07/2010   Follow up CT scans showed resolution by 03/2011--NO MALIGNANCY.   History of pneumonia 06/2010   HTN (hypertension) 01/07/2014   Osteoarthritis of left knee    + past injury of this knee--tibia fracture?  +left leg shorter than right   Seizure (HCC)    Seizure disorder (HCC)    3 total seizures (first was 1995); neuro eval done and recommended lifetime phenytoin (he had a seizure after coming off the med in the late 90s   Tobacco dependence     Current Outpatient Medications  Medication Sig Dispense Refill   acetaminophen (TYLENOL) 325 MG tablet Take 2 tablets (650 mg total) by mouth every 6 (six) hours as needed for mild pain or headache (or Fever >/= 101). 30 tablet 2   albuterol (PROVENTIL) (2.5 MG/3ML) 0.083% nebulizer solution Take 3 mLs (2.5 mg total) by nebulization every 6 (six) hours as needed for wheezing or shortness of breath. 75 mL 12   albuterol (VENTOLIN HFA) 108 (90 Base) MCG/ACT inhaler INHALE 2 PUFFS INTO THE LUNGS EVERY 6 HOURS AS NEEDED FOR WHEEZING OR SHORTNESS OF BREATH 20.1 g 0   ALPRAZolam (XANAX) 0.5 MG tablet TAKE 1 TABLET(0.5  MG) BY MOUTH THREE TIMES DAILY 90 tablet 0   aspirin EC 81 MG tablet Take 1 tablet (81 mg total) by mouth daily. Swallow whole. 90 tablet 3   atorvastatin (LIPITOR) 40 MG tablet Take 1 tablet (40 mg total) by mouth daily. 90 tablet 3   Budeson-Glycopyrrol-Formoterol (BREZTRI AEROSPHERE) 160-9-4.8 MCG/ACT AERO Inhale 2 puffs into the lungs in the morning and at bedtime. 32.1 g 3   citalopram (CELEXA) 40 MG tablet TAKE 1 TABLET EVERY DAY 90 tablet 1   Ferrous Sulfate (IRON) 325 (65 Fe) MG TABS Take 1 tablet by mouth in the morning and at bedtime.     furosemide (LASIX) 20 MG tablet Take 1 tablet (20 mg total) by mouth daily as  needed. 90 tablet 0   levocetirizine (XYZAL) 5 MG tablet Take 5 mg by mouth every evening.     losartan (COZAAR) 50 MG tablet TAKE 1/2 TABLET(25 MG) BY MOUTH DAILY 45 tablet 0   Nutritional Supplements (NUTRITIONAL SUPPLEMENT PO) Take by mouth. Super Beta Prostate Advanced     OXYGEN Inhale into the lungs at bedtime.     phenytoin (DILANTIN) 100 MG ER capsule TAKE 3 CAPSULES EVERY DAY 270 capsule 10   tamsulosin (FLOMAX) 0.4 MG CAPS capsule TAKE 1 CAPSULE(0.4 MG) BY MOUTH DAILY 90 capsule 0   No current facility-administered medications for this visit.    Allergies  Allergen Reactions   Nsaids Other (See Comments)    GI bleed and vomiting    Family History  Problem Relation Age of Onset   Heart disease Mother    Heart disease Father    Cancer Sister    Cirrhosis Sister    Cirrhosis Brother    Liver cancer Cousin        Materinal Side   Stroke Neg Hx     Social History   Socioeconomic History   Marital status: Married    Spouse name: Not on file   Number of children: Not on file   Years of education: Not on file   Highest education level: Not on file  Occupational History   Not on file  Tobacco Use   Smoking status: Every Day    Current packs/day: 0.75    Average packs/day: 0.8 packs/day for 51.0 years (38.3 ttl pk-yrs)    Types: Cigarettes   Smokeless tobacco: Never   Tobacco comments:    10 cigarettes daily--08/27/2022 hfb  Vaping Use   Vaping status: Never Used  Substance and Sexual Activity   Alcohol use: No    Alcohol/week: 0.0 standard drinks of alcohol   Drug use: Not Currently    Types: Marijuana    Comment: 2 month   Sexual activity: Yes    Partners: Male  Other Topics Concern   Not on file  Social History Narrative   Married, 2 grown children   Eighth grade education.  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   Originally from [place].   Tobacco 100 pack-yr hx, still smoking as of 10/2011 (1 pack per day).   Denies alcohol  or drug use.   No exercise.   Social Determinants of Health   Financial Resource Strain: Low Risk  (01/01/2022)   Overall Financial Resource Strain (CARDIA)    Difficulty of Paying Living Expenses: Not hard at all  Food Insecurity: No Food Insecurity (01/01/2022)   Hunger Vital Sign    Worried About Running Out of Food in the Last Year: Never true  Ran Out of Food in the Last Year: Never true  Transportation Needs: No Transportation Needs (01/01/2022)   PRAPARE - Administrator, Civil Service (Medical): No    Lack of Transportation (Non-Medical): No  Physical Activity: Sufficiently Active (01/01/2022)   Exercise Vital Sign    Days of Exercise per Week: 5 days    Minutes of Exercise per Session: 30 min  Stress: No Stress Concern Present (01/01/2022)   [employer] of Occupational Health - Occupational Stress Questionnaire    Feeling of Stress : Not at all  Social Connections: Moderately Isolated (01/01/2022)   Social Connection and Isolation Panel [NHANES]    Frequency of Communication with Friends and Family: More than three times a week    Frequency of Social Gatherings with Friends and Family: More than three times a week    Attends Religious Services: Never    Database administrator or Organizations: No    Attends Banker Meetings: Never    Marital Status: Married  Catering manager Violence: Not At Risk (01/01/2022)   Humiliation, Afraid, Rape, and Kick questionnaire    Fear of Current or Ex-Partner: No    Emotionally Abused: No    Physically Abused: No    Sexually Abused: No     Constitutional: Denies fever, malaise, fatigue, headache or abrupt weight changes.  HEENT: Patient reports difficulty with vision, right ear pain.  Denies eye pain, eye redness, ringing in the ears, wax buildup, runny nose, nasal congestion, bloody nose, or sore throat. Respiratory: Patient ports shortness of breath.  Denies difficulty breathing, cough or sputum  production.   Cardiovascular: Denies chest pain, chest tightness, palpitations or swelling in the hands or feet.  Gastrointestinal: Denies abdominal pain, bloating, constipation, diarrhea or blood in the stool.  GU: Patient reports urinary urgency, hesitancy and nocturia.  Denies frequency, pain with urination, burning sensation, blood in urine, odor or discharge. Musculoskeletal: Patient reports joint pain.  Denies decrease in range of motion, difficulty with gait, muscle pain or joint swelling.  Skin: Denies redness, rashes, lesions or ulcercations.  Neurological: Denies dizziness, difficulty with memory, difficulty with speech or problems with balance and coordination.  Psych: Patient has a history of anxiety.  Denies depression, SI/HI.  No other specific complaints in a complete review of systems (except as listed in HPI above).  Objective:   Physical Exam  There were no vitals taken for this visit. Wt Readings from Last 3 Encounters:  09/02/22 112 lb 6.4 oz (51 kg)  08/27/22 115 lb 3.2 oz (52.3 kg)  07/06/22 113 lb 9.6 oz (51.5 kg)    General: Appears his stated age, underweight, in NAD. Skin: Warm, dry and intact.  HEENT: Head: normal shape and size; Eyes: sclera white, no icterus, conjunctiva pink, PERRLA and EOMs intact; ears: Bilateral cerumen impaction Cardiovascular: Normal rate and rhythm. S1,S2 noted.  No murmur, rubs or gallops noted. No JVD or BLE edema. No carotid bruits noted. Pulmonary/Chest: Normal effort and positive vesicular breath sounds. No respiratory distress. No wheezes, rales or ronchi noted.  Abdomen:Normal bowel sounds.  Musculoskeletal: No difficulty with gait.  Neurological: Alert and oriented.  Coordination normal.  Psychiatric: Mood and affect normal. Mildly anxious appearing. Judgment and thought content normal.    BMET    Component Value Date/Time   NA 139 05/05/2022 1512   K 3.8 05/05/2022 1512   CL 103 05/05/2022 1512   CO2 27 05/05/2022  1512   GLUCOSE 98 05/05/2022 1512  BUN 9 05/05/2022 1512   CREATININE 0.71 05/05/2022 1512   CREATININE 0.67 (L) 03/15/2022 1445   CALCIUM 9.0 05/05/2022 1512   GFRNONAA >60 05/05/2022 1512   GFRAA >60 03/10/2018 0506    Lipid Panel     Component Value Date/Time   CHOL 164 08/27/2022 1518   TRIG 129 08/27/2022 1518   HDL 55 08/27/2022 1518   CHOLHDL 3.0 08/27/2022 1518   VLDL 26 08/27/2022 1518   LDLCALC 83 08/27/2022 1518   LDLCALC 97 03/15/2022 1445    CBC    Component Value Date/Time   WBC 10.1 07/06/2022 1629   RBC 4.19 (L) 07/06/2022 1629   HGB 13.1 07/06/2022 1629   HCT 39.1 07/06/2022 1629   PLT 190 07/06/2022 1629   MCV 93.3 07/06/2022 1629   MCH 31.3 07/06/2022 1629   MCHC 33.5 07/06/2022 1629   RDW 13.9 07/06/2022 1629   LYMPHSABS 1.9 07/06/2022 1629   MONOABS 0.8 07/06/2022 1629   EOSABS 1.2 (H) 07/06/2022 1629   BASOSABS 0.1 07/06/2022 1629    Hgb A1C Lab Results  Component Value Date   HGBA1C 5.1 09/14/2021            Assessment & Plan:   Bilateral cerumen impaction:  He declines manual lavage today Advised him to try Debrox OTC  RTC in 5 months for your annual exam Nicki Reaper, NP

## 2022-09-20 NOTE — Assessment & Plan Note (Signed)
Avoid foods that trigger your reflux Continue Tums as needed

## 2022-09-20 NOTE — Assessment & Plan Note (Signed)
Only taking furosemide as needed Continue losartan He will continue to follow with cardiology

## 2022-10-14 ENCOUNTER — Other Ambulatory Visit: Payer: Self-pay | Admitting: Internal Medicine

## 2022-10-15 NOTE — Telephone Encounter (Signed)
Requested medication (s) are due for refill today: Yes  Requested medication (s) are on the active medication list: Yes  Last refill:  09/16/22  Future visit scheduled: No  Notes to clinic:  Not delegated.    Requested Prescriptions  Pending Prescriptions Disp Refills   ALPRAZolam (XANAX) 0.5 MG tablet [Pharmacy Med Name: ALPRAZOLAM 0.5MG  TABLETS] 90 tablet     Sig: TAKE 1 TABLET(0.5 MG) BY MOUTH THREE TIMES DAILY     Not Delegated - Psychiatry: Anxiolytics/Hypnotics 2 Failed - 10/14/2022  9:06 AM      Failed - This refill cannot be delegated      Failed - Urine Drug Screen completed in last 360 days      Passed - Patient is not pregnant      Passed - Valid encounter within last 6 months    Recent Outpatient Visits           3 weeks ago Aortic atherosclerosis [facility])   [facility] [place], Salvadore Oxford, NP   2 months ago Stage 3 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 [facility])   [place] [facility] Delles, Gentry Fitz A, RPH-CPP   7 months ago Encounter for annual general medical examination with abnormal findings in adult   [facility] [place], Salvadore Oxford, NP   7 months ago Stage 3 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 [facility])   [place] [facility] Delles, Gentry Fitz A, RPH-CPP   9 months ago Stage 3 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 [facility])   [facility], Jackelyn Poling, RPH-CPP       Future Appointments             In 2 months Agbor-Etang, Arlys John, MD [facility] [place]   In 5 months Baity, Salvadore Oxford, NP [place], [facility]

## 2022-10-16 ENCOUNTER — Other Ambulatory Visit: Payer: Self-pay | Admitting: Pulmonary Disease

## 2022-11-15 ENCOUNTER — Other Ambulatory Visit: Payer: Self-pay | Admitting: Internal Medicine

## 2022-11-15 NOTE — Telephone Encounter (Signed)
Requested medication (s) are due for refill today - ytes  Requested medication (s) are on the active medication list -yes  Future visit scheduled -yes  Last refill: 10/15/22 #90  Notes to clinic: non delegated Rx  Requested Prescriptions  Pending Prescriptions Disp Refills   ALPRAZolam (XANAX) 0.5 MG tablet [Pharmacy Med Name: ALPRAZOLAM 0.5MG  TABLETS] 90 tablet     Sig: TAKE 1 TABLET(0.5 MG) BY MOUTH THREE TIMES DAILY     Not Delegated - Psychiatry: Anxiolytics/Hypnotics 2 Failed - 11/15/2022  9:03 AM      Failed - This refill cannot be delegated      Failed - Urine Drug Screen completed in last 360 days      Passed - Patient is not pregnant      Passed - Valid encounter within last 6 months    Recent Outpatient Visits           1 month ago Aortic atherosclerosis (HCC)   [place] [facility] [place], Salvadore Oxford, NP   3 months ago Stage 3 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 [facility])   [place] [facility] Delles, Gentry Fitz A, RPH-CPP   8 months ago Encounter for annual general medical examination with abnormal findings in adult   [facility] [address] [facility] [place], Salvadore Oxford, NP   8 months ago Stage 3 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 [facility])   [place] [facility] Delles, Gentry Fitz A, RPH-CPP   10 months ago Stage 3 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 [facility])   [place] [facility] Delles, Jackelyn Poling, RPH-CPP       Future Appointments             In 1 month Agbor-Etang, Arlys John, MD [facility] [place]   In 4 months Baity, Salvadore Oxford, NP [place] [facility], [facility]               Requested Prescriptions  Pending Prescriptions Disp Refills   ALPRAZolam Prudy Feeler) 0.5 MG tablet [Pharmacy Med Name: ALPRAZOLAM 0.5MG  TABLETS] 90 tablet     Sig: TAKE 1 TABLET(0.5 MG) BY MOUTH THREE TIMES DAILY     Not Delegated - Psychiatry: Anxiolytics/Hypnotics 2 Failed - 11/15/2022   9:03 AM      Failed - This refill cannot be delegated      Failed - Urine Drug Screen completed in last 360 days      Passed - Patient is not pregnant      Passed - Valid encounter within last 6 months    Recent Outpatient Visits           1 month ago Aortic atherosclerosis [facility])   [place] [place], Salvadore Oxford, NP   3 months ago Stage 3 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 [facility])   [facility] Delles, Gentry Fitz A, RPH-CPP   8 months ago Encounter for annual general medical examination with abnormal findings in adult   [facility] [place], Salvadore Oxford, NP   8 months ago Stage 3 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 [facility])   [facility] Delles, Gentry Fitz A, RPH-CPP   10 months ago Stage 3 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 [facility])    [facility] Delles, Jackelyn Poling, RPH-CPP       Future Appointments  In 1 month Agbor-Etang, Arlys John, MD [facility] at [place]   In 4 months Baity, Salvadore Oxford, NP [facility], [facility]

## 2022-11-26 ENCOUNTER — Other Ambulatory Visit: Payer: Self-pay | Admitting: Internal Medicine

## 2022-11-26 DIAGNOSIS — F411 Generalized anxiety disorder: Secondary | ICD-10-CM

## 2022-11-26 NOTE — Telephone Encounter (Signed)
Requested Prescriptions  Pending Prescriptions Disp Refills   citalopram (CELEXA) 40 MG tablet [Pharmacy Med Name: Citalopram Hydrobromide Oral Tablet 40 MG] 90 tablet 1    Sig: TAKE 1 TABLET EVERY DAY     Psychiatry:  Antidepressants - SSRI Passed - 11/26/2022  4:14 PM      Passed - Valid encounter within last 6 months    Recent Outpatient Visits           2 months ago Aortic atherosclerosis [facility])   [place] [facility] [place], Salvadore Oxford, NP   3 months ago Stage 3 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 [facility])   [place] [facility] Delles, Gentry Fitz A, RPH-CPP   8 months ago Encounter for annual general medical examination with abnormal findings in adult   [facility] [place], Salvadore Oxford, NP   8 months ago Stage 3 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 [facility])   [place] [facility] Delles, Gentry Fitz A, RPH-CPP   10 months ago Stage 3 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 [facility])   [place] [facility] Delles, Jackelyn Poling, RPH-CPP       Future Appointments             In 1 month Agbor-Etang, Arlys John, MD [facility] at [place]   In 3 months Baity, Salvadore Oxford, NP [place] [facility], [facility]

## 2022-12-02 ENCOUNTER — Ambulatory Visit: Payer: Medicare HMO | Admitting: Pulmonary Disease

## 2022-12-02 ENCOUNTER — Other Ambulatory Visit: Payer: Self-pay | Admitting: Internal Medicine

## 2022-12-02 DIAGNOSIS — I10 Essential (primary) hypertension: Secondary | ICD-10-CM

## 2022-12-02 NOTE — Telephone Encounter (Signed)
Requested Prescriptions  Pending Prescriptions Disp Refills   tamsulosin (FLOMAX) 0.4 MG CAPS capsule [Pharmacy Med Name: TAMSULOSIN 0.4MG  CAPSULES] 90 capsule 0    Sig: TAKE 1 CAPSULE(0.4 MG) BY MOUTH DAILY     Urology: Alpha-Adrenergic Blocker Passed - 12/02/2022 12:14 PM      Passed - PSA in normal range and within 360 days    PSA  Date Value Ref Range Status  03/15/2022 0.95 < OR = 4.00 ng/mL Final    Comment:    The total PSA value from this assay system is  standardized against the WHO standard. The test  result will be approximately 20% lower when compared  to the equimolar-standardized total PSA (Beckman  Coulter). Comparison of serial PSA results should be  interpreted with this fact in mind. . This test was performed using the Siemens  chemiluminescent method. Values obtained from  different assay methods cannot be used interchangeably. PSA levels, regardless of value, should not be interpreted as absolute evidence of the presence or absence of disease.   02/05/2020 0.92 0.10 - 4.00 ng/ml Final    Comment:    Test performed using Access Hybritech PSA Assay, a parmagnetic partical, chemiluminecent immunoassay.         Passed - Last BP in normal range    BP Readings from Last 1 Encounters:  09/20/22 128/64         Passed - Valid encounter within last 12 months    Recent Outpatient Visits           2 months ago Aortic atherosclerosis [facility])   [place] [facility] [place], Salvadore Oxford, NP   3 months ago Stage 3 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 [facility])   [place] [facility] D[facility], Jackelyn Poling, RPH-CPP   8 months ago Encounter for annual general medical examination with abnormal findings in adult   [facility] [place], Salvadore Oxford, NP   8 months ago Stage 3 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 [facility])   [place] [facility] Delles, Gentry Fitz A, RPH-CPP   10 months ago Stage 3 severe COPD by  GOLD classification [facility])   [place] [facility] D[place], Jackelyn Poling, RPH-CPP       Future Appointments             In 1 month Agbor-Etang, Arlys John, MD [facility] at [place]   In 3 months Baity, Salvadore Oxford, NP [facility], [facility]             losartan (COZAAR) 50 MG tablet [Pharmacy Med Name: LOSARTAN 50MG  TABLETS] 45 tablet 0    Sig: TAKE 1/2 TABLET(25 MG) BY MOUTH DAILY     Cardiovascular:  Angiotensin Receptor Blockers Failed - 12/02/2022 12:14 PM      Failed - Cr in normal range and within 180 days    Creat  Date Value Ref Range Status  03/15/2022 0.67 (L) 0.70 - 1.35 mg/dL Final   Creatinine, Ser  Date Value Ref Range Status  05/05/2022 0.71 0.61 - 1.24 mg/dL Final   Creatinine,U  Date Value Ref Range Status  11/03/2012 46.42 mg/dL Final    Comment:       Cutoff Values for Urine Drug Screen, Pain Mgmt          Drug Class           Cutoff (ng/mL)  Amphetamines             500          Barbiturates             200          Cocaine Metabolites      150          Benzodiazepines          200          Methadone                300          Opiates                  300          Phencyclidine             25          Propoxyphene             300          Marijuana Metabolites     50    For medical purposes only.         Failed - K in normal range and within 180 days    Potassium  Date Value Ref Range Status  05/05/2022 3.8 3.5 - 5.1 mmol/L Final         Passed - Patient is not pregnant      Passed - Last BP in normal range    BP Readings from Last 1 Encounters:  09/20/22 128/64         Passed - Valid encounter within last 6 months    Recent Outpatient Visits           2 months ago Aortic atherosclerosis [facility]   [place] [facility] [place], Salvadore Oxford, NP   3 months ago Stage 3 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 [facility])   [place] [facility] D[facility], Jackelyn Poling, RPH-CPP   8 months ago Encounter for annual general medical examination with abnormal findings in adult   [facility] [place], Salvadore Oxford, NP   8 months ago Stage 3 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 [facility])   [place] [facility], Gentry Fitz A, RPH-CPP   10 months ago Stage 3 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 [facility])   [place] [facility] D[place], Jackelyn Poling, RPH-CPP       Future Appointments             In 1 month Agbor-Etang, Arlys John, MD [facility] at [place]   In 3 months Baity, Salvadore Oxford, NP [place] [facility], [facility]

## 2022-12-15 ENCOUNTER — Other Ambulatory Visit: Payer: Self-pay | Admitting: Internal Medicine

## 2022-12-15 NOTE — Telephone Encounter (Signed)
Requested medication (s) are due for refill today: Yes  Requested medication (s) are on the active medication list: Yes  Last refill:  11/15/22  Future visit scheduled: No  Notes to clinic:  Not delegated.    Requested Prescriptions  Pending Prescriptions Disp Refills   ALPRAZolam (XANAX) 0.5 MG tablet [Pharmacy Med Name: ALPRAZOLAM 0.5MG  TABLETS] 90 tablet     Sig: TAKE 1 TABLET(0.5 MG) BY MOUTH THREE TIMES DAILY     Not Delegated - Psychiatry: Anxiolytics/Hypnotics 2 Failed - 12/15/2022  9:04 AM      Failed - This refill cannot be delegated      Failed - Urine Drug Screen completed in last 360 days      Passed - Patient is not pregnant      Passed - Valid encounter within last 6 months    Recent Outpatient Visits           2 months ago Aortic atherosclerosis [facility])   [place] [facility] [place], Salvadore Oxford, NP   4 months ago Stage 3 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 [facility])   [place] [facility] Delles, Gentry Fitz A, RPH-CPP   9 months ago Encounter for annual general medical examination with abnormal findings in adult   [facility] [place], Salvadore Oxford, NP   9 months ago Stage 3 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 [facility])   [place] [facility] Delles, Gentry Fitz A, RPH-CPP   11 months ago Stage 3 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 [facility])   [place] [facility] Delles, Jackelyn Poling, RPH-CPP       Future Appointments             In 2 weeks Ag[place]Et[place], Arlys John, MD [facility] [place]   In 3 months Baity, Salvadore Oxford, NP [place] [facility], [facility]

## 2022-12-20 ENCOUNTER — Ambulatory Visit: Payer: Medicare HMO | Admitting: Pharmacist

## 2022-12-20 DIAGNOSIS — J449 Chronic obstructive pulmonary disease, unspecified: Secondary | ICD-10-CM

## 2022-12-20 DIAGNOSIS — I10 Essential (primary) hypertension: Secondary | ICD-10-CM

## 2022-12-20 NOTE — Patient Instructions (Signed)
Goals Addressed             This Visit's Progress    Pharmacy Goals       Please watch the mail for an envelope from [facility] containing the patient assistance program application. Please complete this application and mail back to [facility] Pharmacy Technician Noreene Larsson [place] along with a copy of your Medicare Part D prescription card and a copy of your proof of income document OR you can bring these documents to the office to have them faxed back to Attention: Pattricia Boss at Fax # [phone]   If you need to call Noreene Larsson, you can reach her at [phone]  Please use your Breztri 2 puffs twice a day.  Make sure you rinse your mouth well after you use it.  You can contact AZ[employer]Me at [phone] to follow up about refills of Breztri  Please consider calling the [place] Quitline for their help with quitting smoking.  The [place] Quitline phone number is: [phone]  Feel free to call me with any questions or concerns. I look forward to our next call!  Estelle Grumbles, PharmD, [facility] Clinical Pharmacist [facility] [phone]

## 2022-12-20 NOTE — Progress Notes (Addendum)
12/20/2022 Name: Melvin Whiteford MRN: [id number] DOB: [birth year]  Chief Complaint  Patient presents with   Medication Assistance    Koda Routon is a 72 y.o. year old male who presented for a telephone visit.   They were referred to the pharmacist by their PCP for assistance in managing medication access.      Subjective:   Care Team: Primary Care Provider: Lorre Munroe, NP; Next Scheduled Visit: 03/17/2023 Pulmonologist: Salena Saner, MD; Next Scheduled Visit: 12/21/2022 Cardiologist: [facility] [place]; Next Scheduled Visit: 01/03/2023  Medication Access/Adherence  Current Pharmacy:  [id number] - [place], [place] - [address] [place] [address] Phone: [phone] Fax: [phone]  [facility] - [place], [place] - [address] [place] [address] Phone: [phone] Fax: [phone]  [facility] [place], [place] - [address] [place] [address] Phone: [phone] Fax: [phone]   Patient reports affordability concerns with their medications: No  Patient reports access/transportation concerns to their pharmacy: No  Patient reports adherence concerns with their medications:  No       COPD: Patient followed by [facility] Current treatment: Breztri - 2 puffs twice daily Albuterol as needed Confirms using Breztri inhaler twice daily (morning and evening) and rinsing mouth out after use   Current medication access: enrolled in [place] patient assistance from AZ[employer]Me through 02/15/2023 - Reports currently has ~3 month supply of Breztri remaining     Hypertension/HFpEF:   Current medications:  ACEi/ARB/ARNI: losartan 50 mg - 1/2 tablet (25 mg) daily Diuretic regimen: furosemide 20 mg daily as needed Denies needing furosemide recently   Denies monitoring home  blood pressure recently     Objective:  Lab Results  Component Value Date   CREATININE 0.71 05/05/2022   BUN 9 05/05/2022   NA 139 05/05/2022   K 3.8 05/05/2022   CL 103 05/05/2022   CO2 27 05/05/2022    Lab Results  Component Value Date   CHOL 164 08/27/2022   HDL 55 08/27/2022   LDLCALC 83 08/27/2022   TRIG 129 08/27/2022   CHOLHDL 3.0 08/27/2022   BP Readings from Last 3 Encounters:  09/20/22 128/64  09/02/22 118/78  08/27/22 110/66   Pulse Readings from Last 3 Encounters:  09/20/22 95  09/02/22 86  08/27/22 61     Medications Reviewed Today     Reviewed by Manuela Neptune, RPH-CPP (Pharmacist) on 12/20/22 at 1456  Med List Status: <None>   Medication Order Taking? Sig Documenting Provider Last Dose Status Informant  acetaminophen (TYLENOL) 325 MG tablet [id number]  Take 2 tablets (650 mg total) by mouth every 6 (six) hours as needed for mild pain or headache (or Fever >/= 101). Shon Hale, MD  Active Self  albuterol (PROVENTIL) (2.5 MG/3ML) 0.083% nebulizer solution [id number]  Take 3 mLs (2.5 mg total) by nebulization every 6 (six) hours as needed for wheezing or shortness of breath. Salena Saner, MD  Active   albuterol (VENTOLIN HFA) 108 (90 Base) MCG/ACT inhaler [id number]  INHALE 2 PUFFS INTO THE LUNGS EVERY 6 HOURS AS NEEDED FOR WHEEZING OR SHORTNESS OF Georgette Dover, MD  Active   ALPRAZolam Prudy Feeler) 0.5 MG tablet [id number]  TAKE 1 TABLET(0.5 MG) BY MOUTH THREE TIMES DAILY Lorre Munroe, NP  Active   aspirin EC 81 MG  tablet [id number]  Take 1 tablet (81 mg total) by mouth daily. Swallow whole. Debbe Odea, MD  Active   atorvastatin (LIPITOR) 40 MG tablet [id number]  Take 1 tablet (40 mg total) by mouth daily. Debbe Odea, MD  Active   Budeson-Glycopyrrol-Formoterol (BREZTRI AEROSPHERE) 160-9-4.8 MCG/ACT Sandrea Matte [id number] Yes Inhale 2 puffs into the lungs in the morning and at bedtime. Salena Saner, MD Taking Active    citalopram (CELEXA) 40 MG tablet [id number]  TAKE 1 TABLET EVERY DAY Baity, Salvadore Oxford, NP  Active   Ferrous Sulfate (IRON) 325 (65 Fe) MG TABS [id number]  Take 1 tablet by mouth in the morning and at bedtime. [provider]  Active   furosemide (LASIX) 20 MG tablet [id number] Yes Take 1 tablet (20 mg total) by mouth daily as needed. Debbe Odea, MD Taking Active   levocetirizine (XYZAL) 5 MG tablet [id number]  Take 5 mg by mouth every evening. [provider]  Active   losartan (COZAAR) 50 MG tablet [id number] Yes TAKE 1/2 TABLET(25 MG) BY MOUTH DAILY Sampson Si Salvadore Oxford, NP Taking Active   Nutritional Supplements (NUTRITIONAL SUPPLEMENT PO) [id number]  Take by mouth. Super Beta Prostate Advanced [provider]  Active   OXYGEN [id number]  Inhale into the lungs at bedtime. [provider]  Active Self  phenytoin (DILANTIN) 100 MG ER capsule [id number]  TAKE 3 CAPSULES EVERY DAY Lorre Munroe, NP  Active   tamsulosin (FLOMAX) 0.4 MG CAPS capsule [id number]  TAKE 1 CAPSULE(0.4 MG) BY MOUTH DAILY Baity, Salvadore Oxford, NP  Active   Med List Note Roena Malady, [place] 08/07/19 2542): UDS/CSA 07/06/19 repeat 10/06/2019              Assessment/Plan:    COPD: - Patient to follow up with AZ&Me program as needed for Breztri refills - Will collaborate with Pulmonologist and CPhT to support patient with re-enrollment in patient assistance program for [place] for 2025 calendar year   Hypertension/HFpEF:   Encourage patient to continue to start monitoring home blood pressure, keep a log of these BP/HR readings and to bring this record with him to medical appointments as recommended by his Cardiologist    Follow Up Plan: Clinical Pharmacist will follow up with patient by telephone on 02/21/2023 at 3 pm  Estelle Grumbles, PharmD, [facility] [phone]

## 2022-12-21 ENCOUNTER — Ambulatory Visit: Payer: Medicare HMO | Admitting: Pulmonary Disease

## 2022-12-21 ENCOUNTER — Encounter: Payer: Self-pay | Admitting: Pulmonary Disease

## 2022-12-21 VITALS — BP 102/60 | HR 71 | Temp 97.8°F | Ht 68.0 in | Wt 117.0 lb

## 2022-12-21 DIAGNOSIS — J9611 Chronic respiratory failure with hypoxia: Secondary | ICD-10-CM

## 2022-12-21 DIAGNOSIS — Z720 Tobacco use: Secondary | ICD-10-CM

## 2022-12-21 DIAGNOSIS — J449 Chronic obstructive pulmonary disease, unspecified: Secondary | ICD-10-CM

## 2022-12-21 DIAGNOSIS — Z23 Encounter for immunization: Secondary | ICD-10-CM

## 2022-12-21 NOTE — Patient Instructions (Signed)
VISIT SUMMARY:  Today, we discussed your ongoing respiratory symptoms and smoking habits. You reported variable symptoms with some days being more challenging than others. You are currently using Nicorette gum and a Breztri inhaler, which you find helpful. We also reviewed your general health maintenance needs.  YOUR PLAN:  -CHRONIC OBSTRUCTIVE PULMONARY DISEASE (COPD): COPD is a chronic lung condition that makes it hard to breathe. Your symptoms vary, with some days being more difficult than others. You are currently using Breztri inhaler and Nicorette gum to help manage your symptoms and aid in smoking cessation. Please continue with your current management plan and keep up the good work on quitting smoking.  -GENERAL HEALTH MAINTENANCE: We administered your influenza vaccine today to help protect you from the flu. Please schedule a follow-up appointment in three months to monitor your progress and overall health.  INSTRUCTIONS:  Please schedule a follow-up appointment in three months.

## 2022-12-21 NOTE — Progress Notes (Signed)
Subjective:    Patient ID: Elijah Mcfarland, male    DOB: 02-Feb-1951, 72 y.o.   MRN: [id number]  Patient Care Team: Lorre Munroe, NP as PCP - General (Internal Medicine) Debbe Odea, MD as PCP - Cardiology (Cardiology) Ronney Asters Jackelyn Poling, RPH-CPP as Pharmacist Salena Saner, MD as Consulting Physician (Pulmonary Disease)  Chief Complaint  Patient presents with   Follow-up    Dry cough. Shortness of breath on exertion and occ at rest. Occasional wheezing.     BACKGROUND/INTERVAL:72 year male, current smoker.  Past medical history significant for severe COPD, chronic respiratory failure, hypertension, aortic arthrosclerosis, GERD, arthritis, hyperlipidemia, macular degeneration, prediabetes, tobacco abuse.  Also for severe COPD with chronic respiratory failure with hypoxia.  This is a scheduled visit.  Seen on 27 August 2022 by Buelah Manis, NP.  Since that prior visit he has not had any new issues.  No recent exacerbations.  No admissions to the hospital.  HPI Discussed the use of AI scribe software for clinical note transcription with the patient, who gave verbal consent to proceed.  History of Present Illness   The patient, a current smoker with a history of respiratory issues, presents with variable symptoms. He reports smoking between eight to ten cigarettes per day, with some days being more challenging than others. He has been using Nicorette gum and Breztri inhaler, both of which he finds helpful. The inhaler, in particular, seems to provide relief, allowing him to start his day after two puffs in the morning.  His respiratory symptoms fluctuate, with some days being more difficult than others. On challenging days, he finds it hard to get around. He also reports occasional coughing, primarily in the mornings, which he attributes to sinus drainage. The expectorated sputum is described as white. He denies any leg swelling.      DATA 03/28/2019 echocardiogram: LVEF 55 to  60%, no regional wall motion abnormality, grade 1 DD, normal right ventricular systolic function.  No valvular abnormalities 03/29/2019 PFTs: FEV1 1.17 L or 40% predicted, FVC 2.68 L or 68% predicted, FEV1/FVC 44%, there is bronchodilator response with regards to air trapping.  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  FEV1 1.23 L or 43% predicted, FVC 2.90 L or 76% predicted, FEV1/FVC 42%, there is bronchodilator response with regards to air trapping.  There is air trapping without hyperinflation, small airways component.  Diffusion capacity severely reduced as prior.  Overall no significant change.  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  Stage 3 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 (HCC) 04/25/2006   Arthritis 04/25/2006    Social History   Tobacco Use   Smoking status: Every Day    Current  packs/day: 0.50    Average packs/day: 3.0 packs/day for 53.8 years (159.4 ttl pk-yrs)    Types: Cigarettes    Start date: 52   Smokeless tobacco: Never   Tobacco comments:    10 cigarettes daily--08/27/2022 hfb  Substance Use Topics   Alcohol use: No    Alcohol/week: 0.0 standard drinks of alcohol    Allergies  Allergen Reactions   Nsaids Other (See Comments)    GI bleed and vomiting    Current Meds  Medication Sig   acetaminophen (TYLENOL) 325 MG tablet Take 2 tablets (650 mg total) by mouth every 6 (six) hours as needed for mild pain or headache (or Fever >/= 101).   albuterol (PROVENTIL) (2.5 MG/3ML) 0.083% nebulizer solution Take 3 mLs (2.5 mg total) by nebulization every 6 (six) hours as needed for wheezing or shortness of breath.   albuterol (VENTOLIN HFA) 108 (90 Base) MCG/ACT inhaler INHALE 2 PUFFS INTO THE LUNGS EVERY 6 HOURS AS NEEDED FOR WHEEZING OR SHORTNESS OF BREATH   ALPRAZolam (XANAX) 0.5 MG tablet TAKE 1 TABLET(0.5 MG) BY MOUTH THREE TIMES DAILY   aspirin EC 81 MG tablet Take 1 tablet (81 mg total) by mouth daily. Swallow whole.   atorvastatin (LIPITOR) 40 MG tablet Take 1 tablet (40 mg total) by mouth daily.   Budeson-Glycopyrrol-Formoterol (BREZTRI AEROSPHERE) 160-9-4.8 MCG/ACT AERO Inhale 2 puffs into the lungs in the morning and at bedtime.   cetirizine (ZYRTEC) 10 MG tablet Take 10 mg by mouth as needed for allergies.   citalopram (CELEXA) 40 MG tablet TAKE 1 TABLET EVERY DAY   Ferrous Sulfate (IRON) 325 (65 Fe) MG TABS Take 1 tablet by mouth in the morning and at bedtime.   guaiFENesin (MUCINEX) 600 MG 12 hr tablet Take 1,200 mg by mouth 2 (two) times daily.   losartan (COZAAR) 50 MG tablet TAKE 1/2 TABLET(25 MG) BY MOUTH DAILY   nicotine polacrilex (NICORETTE) 4 MG gum Take 4 mg by mouth as needed for smoking cessation.   OXYGEN Inhale 2 L into the lungs daily as needed (with activity). Wears nightly   phenytoin (DILANTIN) 100 MG ER capsule TAKE 3 CAPSULES  EVERY DAY   tamsulosin (FLOMAX) 0.4 MG CAPS capsule TAKE 1 CAPSULE(0.4 MG) BY MOUTH DAILY    Immunization History  Administered Date(s) Administered   Fluad Quad(high Dose 65+) 11/17/2018, 02/05/2020, 03/10/2021, 03/15/2022   H1N1 01/05/2008   Influenza Whole 11/07/2007   Influenza, High Dose Seasonal PF 03/03/2018   Influenza, Seasonal, Injecte, Preservative Fre 04/13/2013   Influenza,inj,Quad PF,6+ Mos 11/14/2014, 02/19/2016, 02/18/2017   Moderna SARS-COV2 Booster Vaccination 12/26/2019   Moderna Sars-Covid-2 Vaccination 04/22/2019, 05/20/2019   Pneumococcal Conjugate-13 11/14/2014, 02/05/2020   Pneumococcal Polysaccharide-23 11/07/2007, 02/19/2016, 03/03/2018   Td 09/06/2008        Objective:     BP 102/60 (BP Location: Left Arm, Patient Position: Sitting, Cuff Size: Normal)   Pulse 71   Temp 97.8 F (36.6 C) (Temporal)   Ht 5\' 8"  (1.727 m)   Wt 117 lb (53.1 kg)   SpO2 97%   BMI 17.79 kg/m   SpO2: 97 % O2 Device: Nasal cannula O2 Flow Rate (L/min): 2 L/min  GENERAL: Thin, well-developed male, no overt respiratory distress, fully ambulatory.  Comfortable with nasal cannula O2 via POC. HEAD: Normocephalic, atraumatic.  EYES: Pupils equal, round, reactive to light.  No scleral icterus. MOUTH: Dentures both, oral mucosa moist. NECK: Supple. No thyromegaly. No nodules. No JVD.  Trachea midline PULMONARY: Symmetrical air entry, no accessory muscle use noted today, you scattered wheezes noted, no other adventitious sounds, breath sounds are coarse.  Positive Hoover's sign. CARDIOVASCULAR: S1 and S2. Regular rate and rhythm.  No overt murmurs, gallops or rubs noted. GASTROINTESTINAL: No distention.  Benign. MUSCULOSKELETAL: No joint deformity, no clubbing, no edema. NEUROLOGIC: No focal deficits, fluent speech, awake, alert. SKIN: Intact,warm,dry.  No rashes noted on limited exam. PSYCH: Normal mood and behavior   Assessment & Plan:     ICD-10-CM   1. Stage 3 severe  COPD by GOLD classification (HCC)  J44.9     2. Chronic respiratory failure with hypoxia (HCC)  J96.11     3. Tobacco abuse  Z72.0     4. Need for immunization against influenza  Z23 Flu Vaccine Trivalent High Dose (Fluad)      Orders Placed This Encounter  Procedures   Flu Vaccine Trivalent High Dose (Fluad)   Discussion:    Chronic Obstructive Pulmonary Disease (COPD) Variable symptoms with some days worse than others. No current productive cough or leg swelling. Using Breztri inhaler and Nicorette gum for smoking cessation. -Continue current management. -Encourage continued smoking cessation efforts.  General Health Maintenance -Administer influenza vaccine today. -Schedule follow-up appointment in three months.     Gailen Shelter, MD Advanced Bronchoscopy PCCM [facility]    *This note was generated using voice recognition software/Dragon and/or AI transcription program.  Despite best efforts to proofread, errors can occur which can change the meaning. Any transcriptional errors that result from this process are unintentional and may not be fully corrected at the time of dictation.

## 2023-01-03 ENCOUNTER — Other Ambulatory Visit: Payer: Self-pay | Admitting: Pharmacy Technician

## 2023-01-03 ENCOUNTER — Encounter: Payer: Self-pay | Admitting: Cardiology

## 2023-01-03 ENCOUNTER — Ambulatory Visit: Payer: Medicare HMO | Attending: Cardiology | Admitting: Cardiology

## 2023-01-03 VITALS — BP 124/82 | HR 82 | Ht 68.0 in | Wt 113.0 lb

## 2023-01-03 DIAGNOSIS — I10 Essential (primary) hypertension: Secondary | ICD-10-CM

## 2023-01-03 DIAGNOSIS — I5030 Unspecified diastolic (congestive) heart failure: Secondary | ICD-10-CM

## 2023-01-03 DIAGNOSIS — F17200 Nicotine dependence, unspecified, uncomplicated: Secondary | ICD-10-CM | POA: Diagnosis not present

## 2023-01-03 DIAGNOSIS — Z5986 Financial insecurity: Secondary | ICD-10-CM

## 2023-01-03 DIAGNOSIS — I2510 Atherosclerotic heart disease of native coronary artery without angina pectoris: Secondary | ICD-10-CM

## 2023-01-03 MED ORDER — FUROSEMIDE 20 MG PO TABS: 20.0000 mg | ORAL_TABLET | Freq: Every day | ORAL | Status: DC

## 2023-01-03 NOTE — Progress Notes (Signed)
Pharmacy Medication Assistance Program Note    01/03/2023  Patient ID: Elijah Mcfarland, male   DOB: Jun 21, 1950, 72 y.o.   MRN: [id number]     01/03/2023  Outreach Medication One  Initial Outreach Date (Medication One) 01/03/2023  Manufacturer Medication One [employer] [name]  Type of Radiographer, therapeutic Assistance  Date Application Sent to Patient 01/05/2023  Application Items Requested Application;Proof of Income;Other  Date Application Sent to Prescriber 01/05/2023  Name of Prescriber Sarina Ser       Signature  Pattricia Boss, CPhT [facility]  Office: [phone] Fax: [phone] Email: [email or web address]

## 2023-01-03 NOTE — Progress Notes (Signed)
Cardiology Office Note:    Date:  01/03/2023   ID:  Mcclellan Pokorski, DOB 11-Nov-1950, MRN [id number]  PCP:  Lorre Munroe, NP   [facility] Providers Cardiologist:  Debbe Odea, MD     Referring MD: Lorre Munroe, NP   Chief Complaint  Patient presents with   Follow-up    Patient denies new or acute cardiac problems/concerns today.      History of Present Illness:    Elijah Mcfarland is a 72 y.o. male with a hx of nonobstructive CAD (CCTA 4/24 mild proximal RCA stenosis, minimal LAD ), hypertension, hyperlipidemia, current smoker x 50+ years, COPD on 2 L oxygen who presents for follow-up.   Denies chest pain, endorses shortness of breath.  Denies edema, takes Lasix 20 mg as needed daily.  He still smokes.  No new concerns today.  Prior notes/testing Echocardiogram 01/2022 EF 55 to 60%, grade 2 diastolic dysfunction. Chest CT lung cancer screening 2/24 LAD and RCA calcifications.   Past Medical History:  Diagnosis Date   Chronic pain syndrome    Left knee   COPD (chronic obstructive pulmonary disease) (HCC)    DDD (degenerative disc disease), lumbar 11/2008   L5-S1 on plain film   Emphysema lung (HCC)    GAD (generalized anxiety disorder)    GERD (gastroesophageal reflux disease)    History of multiple pulmonary nodules 07/2010   Follow up CT scans showed resolution by 03/2011--NO MALIGNANCY.   History of pneumonia 06/2010   HTN (hypertension) 01/07/2014   Osteoarthritis of left knee    + past injury of this knee--tibia fracture?  +left leg shorter than right   Seizure (HCC)    Seizure disorder (HCC)    3 total seizures (first was 1995); neuro eval done and recommended lifetime phenytoin (he had a seizure after coming off the med in the late 90s   Tobacco dependence     Past Surgical History:  Procedure Laterality Date   COLONOSCOPY  2010   At [facility]; normal per pt   COLONOSCOPY WITH PROPOFOL N/A 09/28/2017   Procedure: COLONOSCOPY WITH PROPOFOL;   Surgeon: Wyline Mood, MD;  Location[facility] ENDOSCOPY;  Service: Gastroenterology;  Laterality: N/A;   COLONOSCOPY WITH PROPOFOL N/A 10/13/2017   Procedure: COLONOSCOPY WITH PROPOFOL;  Surgeon: Toney Reil, MD;  Location: [facility] ENDOSCOPY;  Service: Gastroenterology;  Laterality: N/A;   COLONOSCOPY WITH PROPOFOL N/A 02/27/2020   Procedure: COLONOSCOPY WITH PROPOFOL;  Surgeon: Toney Reil, MD;  Location: [facility] ENDOSCOPY;  Service: Gastroenterology;  Laterality: N/A;   ESOPHAGOGASTRODUODENOSCOPY N/A 03/01/2018   Procedure: ESOPHAGOGASTRODUODENOSCOPY (EGD);  Surgeon: Malissa Hippo, MD;  [facility] ENDO [facility];  Service: Endoscopy;  Laterality: N/A;   ESOPHAGOGASTRODUODENOSCOPY N/A 03/06/2018   Procedure: ESOPHAGOGASTRODUODENOSCOPY (EGD);  Surgeon: Malissa Hippo, MD;  Location[facility] ENDO SUITE;  Service: Endoscopy;  Laterality: N/A;   ESOPHAGOGASTRODUODENOSCOPY N/A 03/09/2018   Procedure: ESOPHAGOGASTRODUODENOSCOPY (EGD);  Surgeon: Malissa Hippo, MD;  Location[facility] ENDO [facility]ITE;  Service: Endoscopy;  Laterality: N/A;   GIVENS CAPSULE STUDY N/A 03/08/2018   Procedure: GIVENS CAPSULE STUDY;  Surgeon: Malissa Hippo, MD;  Location[facility] ENDO [facility];  Service: Endoscopy;  Laterality: N/A;   KNEE SURGERY     Left : x 2    Current Medications: Current Meds  Medication Sig   acetaminophen (TYLENOL) 325 MG tablet Take 2 tablets (650 mg total) by mouth every 6 (six) hours as needed for mild pain or headache (or Fever >/= 101).  albuterol (PROVENTIL) (2.5 MG/3ML) 0.083% nebulizer solution Take 3 mLs (2.5 mg total) by nebulization every 6 (six) hours as needed for wheezing or shortness of breath.   albuterol (VENTOLIN HFA) 108 (90 Base) MCG/ACT inhaler INHALE 2 PUFFS INTO THE LUNGS EVERY 6 HOURS AS NEEDED FOR WHEEZING OR SHORTNESS OF BREATH   ALPRAZolam (XANAX) 0.5 MG tablet TAKE 1 TABLET(0.5 MG) BY MOUTH THREE TIMES DAILY   aspirin EC 81 MG tablet Take 1 tablet (81 mg total) by mouth daily.  Swallow whole.   atorvastatin (LIPITOR) 40 MG tablet Take 1 tablet (40 mg total) by mouth daily.   Budeson-Glycopyrrol-Formoterol (BREZTRI AEROSPHERE) 160-9-4.8 MCG/ACT AERO Inhale 2 puffs into the lungs in the morning and at bedtime.   cetirizine (ZYRTEC) 10 MG tablet Take 10 mg by mouth as needed for allergies.   citalopram (CELEXA) 40 MG tablet TAKE 1 TABLET EVERY DAY   Ferrous Sulfate (IRON) 325 (65 Fe) MG TABS Take 1 tablet by mouth in the morning and at bedtime.   furosemide (LASIX) 20 MG tablet Take 1 tablet (20 mg total) by mouth daily.   guaiFENesin (MUCINEX) 600 MG 12 hr tablet Take 1,200 mg by mouth 2 (two) times daily.   losartan (COZAAR) 50 MG tablet TAKE 1/2 TABLET(25 MG) BY MOUTH DAILY   nicotine polacrilex (NICORETTE) 4 MG gum Take 4 mg by mouth as needed for smoking cessation.   OXYGEN Inhale 2 L into the lungs daily as needed (with activity). Wears nightly   phenytoin (DILANTIN) 100 MG ER capsule TAKE 3 CAPSULES EVERY DAY   tamsulosin (FLOMAX) 0.4 MG CAPS capsule TAKE 1 CAPSULE(0.4 MG) BY MOUTH DAILY     Allergies:   Nsaids   Social History   Socioeconomic History   Marital status: Married    Spouse name: Not on file   Number of children: Not on file   Years of education: Not on file   Highest education level: Not on file  Occupational History   Not on file  Tobacco Use   Smoking status: Every Day    Current packs/day: 0.50    Average packs/day: 3.0 packs/day for 53.9 years (159.4 ttl pk-yrs)    Types: Cigarettes    Start date: 62   Smokeless tobacco: Never   Tobacco comments:    10 cigarettes daily--08/27/2022 hfb  Vaping Use   Vaping status: Never Used  Substance and Sexual Activity   Alcohol use: No    Alcohol/week: 0.0 standard drinks of alcohol   Drug use: Not Currently    Types: Marijuana    Comment: 2 month   Sexual activity: Yes    Partners: Male  Other Topics Concern   Not on file  Social History Narrative   Married, 2 grown children    Eighth grade education.  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   Originally from [place].   Tobacco 100 pack-yr hx, still smoking as of 10/2011 (1 pack per day).   Denies alcohol or drug use.   No exercise.   Social Determinants of Health   Financial Resource Strain: Low Risk  (01/01/2022)   Overall Financial Resource Strain (CARDIA)    Difficulty of Paying Living Expenses: Not hard at all  Food Insecurity: No Food Insecurity (01/01/2022)   Hunger Vital Sign    Worried About Running Out of Food in the Last Year: Never true    Ran Out of Food in the Last Year: Never true  Transportation Needs: No Transportation  Needs (01/01/2022)   PRAPARE - Administrator, Civil Service (Medical): No    Lack of Transportation (Non-Medical): No  Physical Activity: Sufficiently Active (01/01/2022)   Exercise Vital Sign    Days of Exercise per Week: 5 days    Minutes of Exercise per Session: 30 min  Stress: No Stress Concern Present (01/01/2022)   [employer] of Occupational Health - Occupational Stress Questionnaire    Feeling of Stress : Not at all  Social Connections: Moderately Isolated (01/01/2022)   Social Connection and Isolation Panel [NHANES]    Frequency of Communication with Friends and Family: More than three times a week    Frequency of Social Gatherings with Friends and Family: More than three times a week    Attends Religious Services: Never    Database administrator or Organizations: No    Attends Engineer, structural: Never    Marital Status: Married     Family History: The patient's family history includes Cancer in his sister; Cirrhosis in his brother and sister; Heart disease in his father and mother; Liver cancer in his cousin. There is no history of Stroke.  ROS:   Please see the history of present illness.     All other systems reviewed and are negative.  EKGs/Labs/Other Studies Reviewed:    The following studies were  reviewed today:  EKG Interpretation Date/Time:  Monday January 03 2023 14:01:24 EST Ventricular Rate:  82 PR Interval:  144 QRS Duration:  72 QT Interval:  394 QTC Calculation: 460 R Axis:   73  Text Interpretation: Normal sinus rhythm Normal ECG Confirmed by Debbe Odea ([id number]) on 01/03/2023 2:05:54 PM    Recent Labs: 05/05/2022: BUN 9; Creatinine, Ser 0.71; Potassium 3.8; Sodium 139 07/06/2022: Hemoglobin 13.1; Platelets 190 08/27/2022: ALT 12  Recent Lipid Panel    Component Value Date/Time   CHOL 164 08/27/2022 1518   TRIG 129 08/27/2022 1518   HDL 55 08/27/2022 1518   CHOLHDL 3.0 08/27/2022 1518   VLDL 26 08/27/2022 1518   LDLCALC 83 08/27/2022 1518   LDLCALC 97 03/15/2022 1445     Risk Assessment/Calculations:             Physical Exam:    VS:  BP 124/82 (BP Location: Left Arm, Patient Position: Sitting, Cuff Size: Normal)   Pulse 82   Ht 5\' 8"  (1.727 m)   Wt 113 lb (51.3 kg)   SpO2 94%   BMI 17.18 kg/m     Wt Readings from Last 3 Encounters:  01/03/23 113 lb (51.3 kg)  12/21/22 117 lb (53.1 kg)  09/20/22 117 lb (53.1 kg)     GEN:  Well nourished, well developed in no acute distress HEENT: Normal NECK: No JVD; No carotid bruits CARDIAC: RRR, no murmurs, rubs, gallops RESPIRATORY: Bilateral wheezing ABDOMEN: Soft, non-tender, non-distended MUSCULOSKELETAL:  no edema; No deformity  SKIN: Warm and dry NEUROLOGIC:  Alert and oriented x 3 PSYCHIATRIC:  Normal affect   ASSESSMENT:    1. Coronary artery disease, unspecified vessel or lesion type, unspecified whether angina present, unspecified whether native or transplanted heart   2. Heart failure with preserved ejection fraction, unspecified HF chronicity (HCC)   3. Primary hypertension   4. Smoking    PLAN:    In order of problems listed above:  CAD, mild RCA, minimal LAD stenosis.  Calcium score 436.  Continue aspirin 81 mg, Lipitor 40 mg daily. HFpEF, grade 2 diastolic dysfunction,  EF 55  to 60%.  Appears euvolemic.  COPD, current smoking contributing to shortness of breath.  Continue Lasix 20 mg daily as needed. Hypertension, BP controlled.  Continue losartan 25 mg daily. Current smoker, smoking cessation advised.  Follow-up yearly      Medication Adjustments/Labs and Tests Ordered: Current medicines are reviewed at length with the patient today.  Concerns regarding medicines are outlined above.  Orders Placed This Encounter  Procedures   EKG 12-Lead   Meds ordered this encounter  Medications   furosemide (LASIX) 20 MG tablet    Sig: Take 1 tablet (20 mg total) by mouth daily.    Patient Instructions  Medication Instructions:   Your physician recommends that you continue on your current medications as directed. Please refer to the Current Medication list given to you today.  *If you need a refill on your cardiac medications before your next appointment, please call your pharmacy*   Lab Work:  None Ordered  If you have labs (blood work) drawn today and your tests are completely normal, you will receive your results only by: MyChart Message (if you have MyChart) OR A paper copy in the mail If you have any lab test that is abnormal or we need to change your treatment, we will call you to review the results.   Testing/Procedures:  None Ordered   Follow-Up: At [facility], you and your health needs are our priority.  As part of our continuing mission to provide you with exceptional heart care, we have created designated Provider Care Teams.  These Care Teams include your primary Cardiologist (physician) and Advanced Practice Providers (APPs -  Physician Assistants and Nurse Practitioners) who all work together to provide you with the care you need, when you need it.  We recommend signing up for the patient portal called "MyChart".  Sign up information is provided on this After Visit Summary.  MyChart is used to connect with patients for  Virtual Visits (Telemedicine).  Patients are able to view lab/test results, encounter notes, upcoming appointments, etc.  Non-urgent messages can be sent to your provider as well.   To learn more about what you can do with MyChart, go to [email or web address].    Your next appointment:   12 month(s)  Provider:   You may see Debbe Odea, MD or one of the following Advanced Practice Providers on your designated Care Team:   Nicolasa Ducking, NP Eula Listen, PA-C Cadence Fransico Michael, PA-C Charlsie Quest, NP Carlos Levering, NP    Signed, Debbe Odea, MD  01/03/2023 2:55 PM    [facility]

## 2023-01-03 NOTE — Patient Instructions (Signed)

## 2023-01-12 ENCOUNTER — Other Ambulatory Visit: Payer: Self-pay | Admitting: Internal Medicine

## 2023-01-12 NOTE — Telephone Encounter (Signed)
Requested medication (s) are due for refill today - yes  Requested medication (s) are on the active medication list -yes  Future visit scheduled -yes  Last refill: 12/16/22 #90  Notes to clinic: non delegated Rx  Requested Prescriptions  Pending Prescriptions Disp Refills   ALPRAZolam (XANAX) 0.5 MG tablet [Pharmacy Med Name: ALPRAZOLAM 0.5MG  TABLETS] 90 tablet     Sig: TAKE 1 TABLET(0.5 MG) BY MOUTH THREE TIMES DAILY     Not Delegated - Psychiatry: Anxiolytics/Hypnotics 2 Failed - 01/12/2023  9:05 AM      Failed - This refill cannot be delegated      Failed - Urine Drug Screen completed in last 360 days      Passed - Patient is not pregnant      Passed - Valid encounter within last 6 months    Recent Outpatient Visits           3 weeks ago Stage 3 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 (HCC)   [place] [facility] Delles, Gentry Fitz A, RPH-CPP   3 months ago Aortic atherosclerosis [facility])   [place] [facility] [place], Salvadore Oxford, NP   5 months ago Stage 3 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 [facility])   [place] [facility] Delles, Gentry Fitz A, RPH-CPP   10 months ago Encounter for annual general medical examination with abnormal findings in adult   [facility] [place], Salvadore Oxford, NP   10 months ago Stage 3 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 [facility])   [place] [facility] Delles, Jackelyn Poling, RPH-CPP       Future Appointments             In 2 months Baity, Salvadore Oxford, NP [place] [facility], [facility]               Requested Prescriptions  Pending Prescriptions Disp Refills   ALPRAZolam Prudy Feeler) 0.5 MG tablet [Pharmacy Med Name: ALPRAZOLAM 0.5MG  TABLETS] 90 tablet     Sig: TAKE 1 TABLET(0.5 MG) BY MOUTH THREE TIMES DAILY     Not Delegated - Psychiatry: Anxiolytics/Hypnotics 2 Failed - 01/12/2023  9:05 AM      Failed - This refill cannot be delegated      Failed  - Urine Drug Screen completed in last 360 days      Passed - Patient is not pregnant      Passed - Valid encounter within last 6 months    Recent Outpatient Visits           3 weeks ago Stage 3 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 [facility])   [place] [facility] Delles, Gentry Fitz A, RPH-CPP   3 months ago Aortic atherosclerosis [facility])   [place] [facility] [place], Salvadore Oxford, NP   5 months ago Stage 3 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 [facility])   [place] [facility] Delles, Gentry Fitz A, RPH-CPP   10 months ago Encounter for annual general medical examination with abnormal findings in adult   [facility] [place], Salvadore Oxford, NP   10 months ago Stage 3 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 [facility])   [facility] Delles, Jackelyn Poling, RPH-CPP       Future Appointments             In 2 months Baity, Salvadore Oxford, NP [facility]

## 2023-02-14 ENCOUNTER — Other Ambulatory Visit: Payer: Self-pay | Admitting: Internal Medicine

## 2023-02-18 NOTE — Telephone Encounter (Signed)
 Requested medications are due for refill today.  yes  Requested medications are on the active medications list.  yes  Last refill. 01/12/2023 #90 0 rf  Future visit scheduled.   yes  Notes to clinic.  Refill not delegated.    Requested Prescriptions  Pending Prescriptions Disp Refills   ALPRAZolam  (XANAX ) 0.5 MG tablet [Pharmacy Med Name: ALPRAZOLAM  0.5MG  TABLETS] 90 tablet     Sig: TAKE 1 TABLET(0.5 MG) BY MOUTH THREE TIMES DAILY     Not Delegated - Psychiatry: Anxiolytics/Hypnotics 2 Failed - 02/18/2023  7:49 AM      Failed - This refill cannot be delegated      Failed - Urine Drug Screen completed in last 360 days      Passed - Patient is not pregnant      Passed - Valid encounter within last 6 months    Recent Outpatient Visits           2 months ago Stage 3 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 [facility])   [place] Delles, Sharyle A, RPH-CPP   5 months ago Aortic atherosclerosis [facility])   [facility] [place], Angeline ORN, NP   6 months ago Stage 3 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 [facility])   [place] [facility] Delles, Sharyle A, RPH-CPP   11 months ago Encounter for annual general medical examination with abnormal findings in adult   [facility] [place], Angeline ORN, NP   11 months ago Stage 3 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 [facility])   [place] Delles, Sharyle LABOR, RPH-CPP       Future Appointments             In 3 weeks Baity, Angeline ORN, NP [place] [facility], [facility]

## 2023-02-21 ENCOUNTER — Other Ambulatory Visit: Payer: Medicare HMO | Admitting: Pharmacist

## 2023-02-21 NOTE — Progress Notes (Signed)
 02/21/2023 Name: Elijah Mcfarland MRN: [id number] DOB: [birth year]  Chief Complaint  Patient presents with   Medication Assistance    Elijah Mcfarland is a 73 y.o. year old male who presented for a telephone visit.   They were referred to the pharmacist by their PCP for assistance in managing medication access.      Subjective:   Care Team: Primary Care Provider: Antonette Angeline ORN, NP; Next Scheduled Visit: 03/17/2023 Pulmonologist: Tamea Dedra CROME, MD; Next Scheduled Visit: 03/24/2023 Cardiologist: [facility] [place]    Medication Access/Adherence  Current Pharmacy:  [facility] [id number] - [place], [place] - [address] [place] [address] Phone: [phone] Fax: [phone]  [facility] - [place], [place] - [address] [place] [address] Phone: [phone] Fax: [phone]  [facility] - [place], [place] - [address] [place] [address] Phone: [phone] Fax: [phone]   Patient reports affordability concerns with their medications: No  Patient reports access/transportation concerns to their pharmacy: No  Patient reports adherence concerns with their medications:  No       COPD: Patient [facility]  Current treatment: Breztri  - 2 puffs twice daily Albuterol  as needed  Confirms using Breztri  inhaler twice daily (morning and evening) and rinsing mouth out after use   Current medication access: Collaborating with Pulmonologist and CPhT to support patient with re-enrollment in patient assistance program for Breztri  for 2025 calendar year - Reports currently has 1 month supply of Breztri  remaining - Today denies having received application CPhT mailed on 01/05/2023  Hypertension/HFpEF:   Current medications:  ACEi/ARB/ARNI: losartan  50 mg - 1/2 tablet (25 mg)  daily Diuretic regimen: furosemide  20 mg daily as needed   Denies monitoring home blood pressure recently  Denies symptoms of hypotension such as dizziness or lightheadedness    Objective:   Lab Results  Component Value Date   CREATININE 0.71 05/05/2022   BUN 9 05/05/2022   NA 139 05/05/2022   K 3.8 05/05/2022   CL 103 05/05/2022   CO2 27 05/05/2022    BP Readings from Last 3 Encounters:  01/03/23 124/82  12/21/22 102/60  09/20/22 128/64   Pulse Readings from Last 3 Encounters:  01/03/23 82  12/21/22 71  09/20/22 95    Medications Reviewed Today     Reviewed by Alana Sharyle LABOR, RPH-CPP (Pharmacist) on 02/21/23 at 1718  Med List Status: <None>   Medication Order Taking? Sig Documenting Provider Last Dose Status Informant  acetaminophen  (TYLENOL ) 325 MG tablet [id number]  Take 2 tablets (650 mg total) by mouth every 6 (six) hours as needed for mild pain or headache (or Fever >/= 101). [name], MD  Active Self  albuterol  (PROVENTIL ) (2.5 MG/3ML) 0.083% nebulizer solution [id number]  Take 3 mLs (2.5 mg total) by nebulization every 6 (six) hours as needed for wheezing or shortness of breath. Tamea Dedra CROME, MD  Active   albuterol  (VENTOLIN  HFA) 108 [phone] Base) MCG/ACT inhaler [id number]  INHALE 2 PUFFS INTO THE LUNGS EVERY 6 HOURS AS NEEDED FOR WHEEZING OR SHORTNESS OF SHERIDA Tamea Dedra CROME, MD  Active   ALPRAZolam  (XANAX ) 0.5 MG tablet [id number]  TAKE 1 TABLET(0.5 MG) BY MOUTH THREE TIMES DAILY Antonette Angeline ORN, NP  Active   aspirin  EC 81 MG tablet [id number]  Take 1 tablet (81 mg total) by  mouth daily. Swallow whole. Darliss Rogue, MD  Active   atorvastatin  (LIPITOR) 40 MG tablet [id number]  Take 1 tablet (40 mg total) by mouth daily. Darliss Rogue, MD  Active   Budeson-Glycopyrrol-Formoterol  (BREZTRI  AEROSPHERE) 160-9-4.8 MCG/ACT AERO [id number] Yes Inhale 2 puffs into the lungs in the morning and at bedtime. Tamea Dedra CROME, MD Taking Active    cetirizine (ZYRTEC) 10 MG tablet [id number]  Take 10 mg by mouth as needed for allergies. [provider]  Active   citalopram  (CELEXA ) 40 MG tablet [id number]  TAKE 1 TABLET EVERY DAY Baity, Angeline ORN, NP  Active   Ferrous Sulfate (IRON) 325 (65 Fe) MG TABS [id number]  Take 1 tablet by mouth in the morning and at bedtime. [provider]  Active   furosemide  (LASIX ) 20 MG tablet [id number]  Take 1 tablet (20 mg total) by mouth daily. Darliss Rogue, MD  Active   guaiFENesin  (MUCINEX ) 600 MG 12 hr tablet [id number]  Take 1,200 mg by mouth 2 (two) times daily. [provider]  Active   losartan  (COZAAR ) 50 MG tablet [id number]  TAKE 1/2 TABLET(25 MG) BY MOUTH DAILY Baity, Angeline ORN, NP  Active   nicotine  polacrilex (NICORETTE) 4 MG gum [id number]  Take 4 mg by mouth as needed for smoking cessation. [provider]  Active   OXYGEN  [id number]  Inhale 2 L into the lungs daily as needed (with activity). Wears nightly [provider]  Active Self  phenytoin  (DILANTIN ) 100 MG ER capsule [id number]  TAKE 3 CAPSULES EVERY DAY Baity, Angeline ORN, NP  Active   tamsulosin  (FLOMAX ) 0.4 MG CAPS capsule [id number]  TAKE 1 CAPSULE(0.4 MG) BY MOUTH DAILY Baity, Angeline ORN, NP  Active   Med List Note Elijah Mcfarland, [place] 08/07/19 9182): UDS/CSA 07/06/19 repeat 10/06/2019              Assessment/Plan:   COPD: - Patient to follow up with AZ&Me program as needed for Breztri  refills - Collaborating with Pulmonologist and CPhT to support patient with re-enrollment in patient assistance program for Breztri  for 2025 calendar year  Will ask CPhT to mail another copy of application to patient   Hypertension/HFpEF:   Encourage patient to start monitoring home blood pressure, keep a log of these BP/HR readings and to bring this record with him to medical appointments as recommended by his Cardiologist     Follow Up Plan: Clinical Pharmacist will follow up with patient by  telephone on 03/21/2023 at 3:00 PM    Sharyle Sia, PharmD, [facility] [phone]

## 2023-02-21 NOTE — Patient Instructions (Signed)
 Goals Addressed             This Visit's Progress    Pharmacy Goals       Please watch the mail for an envelope from [facility] containing the patient assistance program application. Please complete this application and mail back to [facility] Pharmacy Technician Noreene Larsson [place] along with a copy of your Medicare Part D prescription card and a copy of your proof of income document OR you can bring these documents to the office to have them faxed back to Attention: Pattricia Boss at Fax # [phone]   If you need to call Noreene Larsson, you can reach her at [phone]  Please use your Breztri 2 puffs twice a day.  Make sure you rinse your mouth well after you use it.  You can contact [employer] at [phone] to follow up about refills of Breztri  Please consider calling the [place] Quitline for their help with quitting smoking.  The [place] Quitline phone number is: [phone]  Feel free to call me with any questions or concerns. I look forward to our next call!  Estelle Grumbles, PharmD, [facility] Clinical Pharmacist [facility] [phone]

## 2023-02-22 ENCOUNTER — Other Ambulatory Visit: Payer: Self-pay | Admitting: Internal Medicine

## 2023-02-25 ENCOUNTER — Other Ambulatory Visit: Payer: Self-pay | Admitting: Internal Medicine

## 2023-02-25 NOTE — Telephone Encounter (Signed)
 Requested Prescriptions  Pending Prescriptions Disp Refills   tamsulosin  (FLOMAX ) 0.4 MG CAPS capsule [Pharmacy Med Name: TAMSULOSIN  0.4MG  CAPSULES] 90 capsule 0    Sig: TAKE 1 CAPSULE(0.4 MG) BY MOUTH DAILY     Urology: Alpha-Adrenergic Blocker Passed - 02/25/2023  1:02 PM      Passed - PSA in normal range and within 360 days    PSA  Date Value Ref Range Status  03/15/2022 0.95 < OR = 4.00 ng/mL Final    Comment:    The total PSA value from this assay system is  standardized against the WHO standard. The test  result will be approximately 20% lower when compared  to the equimolar-standardized total PSA (Beckman  Coulter). Comparison of serial PSA results should be  interpreted with this fact in mind. . This test was performed using the Siemens  chemiluminescent method. Values obtained from  different assay methods cannot be used interchangeably. PSA levels, regardless of value, should not be interpreted as absolute evidence of the presence or absence of disease.   02/05/2020 0.92 0.10 - 4.00 ng/ml Final    Comment:    Test performed using Access Hybritech PSA Assay, a parmagnetic partical, chemiluminecent immunoassay.         Passed - Last BP in normal range    BP Readings from Last 1 Encounters:  01/03/23 124/82         Passed - Valid encounter within last 12 months    Recent Outpatient Visits           2 months ago Stage 3 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 [facility])   [place] [facility] Delles, Sharyle A, RPH-CPP   5 months ago Aortic atherosclerosis [facility])   [place] [facility] [place], Angeline ORN, NP   6 months ago Stage 3 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 [facility])   [place] [facility] Delles, Sharyle LABOR, RPH-CPP   11 months ago Encounter for annual general medical examination with abnormal findings in adult   [facility] [place], Angeline ORN, NP   11 months ago Stage 3 severe COPD by  GOLD classification [facility])   [place] [facility] Delles, Sharyle LABOR, RPH-CPP       Future Appointments             In 3 weeks Baity, Angeline ORN, NP  [facility]

## 2023-02-28 NOTE — Telephone Encounter (Signed)
 Requested medications are due for refill today.  yes  Requested medications are on the active medications list.  yes  Last refill. 11/30/2021 #[id number]  Future visit scheduled.   yes  Notes to clinic.  Refill not delegated.    Requested Prescriptions  Pending Prescriptions Disp Refills   phenytoin  (DILANTIN ) 100 MG ER capsule [Pharmacy Med Name: Phenytoin  Sodium Extended Oral Capsule 100 MG] 270 capsule 3    Sig: TAKE 3 CAPSULES EVERY DAY     Not Delegated - Neurology:  Anticonvulsants - phenytoin  Failed - 02/28/2023  3:19 PM      Failed - This refill cannot be delegated      Failed - Phenytoin  (serum) in normal range and within 360 days    Phenytoin , Total  Date Value Ref Range Status  03/10/2021 10.3 10.0 - 20.0 mg/L Final   PHENYTOIN , FREE  Date Value Ref Range Status  03/15/2022 0.7 (L) 1.0 - 2.0 mg/L Final         Passed - ALT in normal range and within 360 days    ALT  Date Value Ref Range Status  08/27/2022 12 0 - 44 U/L Final         Passed - AST in normal range and within 360 days    AST  Date Value Ref Range Status  08/27/2022 18 15 - 41 U/L Final         Passed - HGB in normal range and within 360 days    Hemoglobin  Date Value Ref Range Status  07/06/2022 13.1 13.0 - 17.0 g/dL Final         Passed - HCT in normal range and within 360 days    HCT  Date Value Ref Range Status  07/06/2022 39.1 39.0 - 52.0 % Final         Passed - PLT in normal range and within 360 days    Platelets  Date Value Ref Range Status  07/06/2022 190 150 - 400 K/uL Final         Passed - WBC in normal range and within 360 days    WBC  Date Value Ref Range Status  07/06/2022 10.1 4.0 - 10.5 K/uL Final         Passed - Completed PHQ-2 or PHQ-9 in the last 360 days      Passed - Patient is not pregnant      Passed - Valid encounter within last 12 months    Recent Outpatient Visits           2 months ago Stage 3 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 (HCC)   [facility] Delles, Sharyle A, RPH-CPP   5 months ago Aortic atherosclerosis [facility])   [place] [facility] [place], Angeline ORN, NP   6 months ago Stage 3 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 [facility])   [place] [facility] Delles, Sharyle A, RPH-CPP   11 months ago Encounter for annual general medical examination with abnormal findings in adult   [facility] [place], Angeline ORN, NP   11 months ago Stage 3 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 [facility])   [place] [facility] Delles, Sharyle LABOR, RPH-CPP       Future Appointments             In 3 weeks Baity, Angeline ORN, NP [place] [facility] [place] [place], [facility]

## 2023-03-01 ENCOUNTER — Other Ambulatory Visit: Payer: Self-pay | Admitting: Internal Medicine

## 2023-03-01 DIAGNOSIS — I10 Essential (primary) hypertension: Secondary | ICD-10-CM

## 2023-03-02 NOTE — Telephone Encounter (Signed)
 Requested Prescriptions  Pending Prescriptions Disp Refills   losartan  (COZAAR ) 50 MG tablet [Pharmacy Med Name: LOSARTAN  50MG  TABLETS] 45 tablet 0    Sig: TAKE 1/2 TABLET(25 MG) BY MOUTH DAILY     Cardiovascular:  Angiotensin Receptor Blockers Failed - 03/02/2023 12:01 PM      Failed - Cr in normal range and within 180 days    Creat  Date Value Ref Range Status  03/15/2022 0.67 (L) 0.70 - 1.35 mg/dL Final   Creatinine, Ser  Date Value Ref Range Status  05/05/2022 0.71 0.61 - 1.24 mg/dL Final   Creatinine,U  Date Value Ref Range Status  11/03/2012 46.42 mg/dL Final    Comment:       Cutoff Values for Urine Drug Screen, Pain Mgmt          Drug Class           Cutoff (ng/mL)          Amphetamines             500          Barbiturates             200          Cocaine Metabolites      150          Benzodiazepines          200          Methadone                300          Opiates                  300          Phencyclidine             25          Propoxyphene             300          Marijuana Metabolites     50    For medical purposes only.         Failed - K in normal range and within 180 days    Potassium  Date Value Ref Range Status  05/05/2022 3.8 3.5 - 5.1 mmol/L Final         Passed - Patient is not pregnant      Passed - Last BP in normal range    BP Readings from Last 1 Encounters:  01/03/23 124/82         Passed - Valid encounter within last 6 months    Recent Outpatient Visits           2 months ago Stage 3 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 [facility])   [place] [facility] Delles, Timoteo Force A, RPH-CPP   5 months ago Aortic atherosclerosis [facility])   [place] [facility] [place], Rankin Buzzard, NP   6 months ago Stage 3 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 [facility])   [place] [facility] Delles, Timoteo Force A, RPH-CPP   11 months ago Encounter for annual general medical examination with abnormal findings in adult    [facility] [place], Rankin Buzzard, NP   11 months ago Stage 3 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 [facility])   [place] [facility] Delles, Severa Daniels, RPH-CPP       Future Appointments  In 2 weeks Baity, Rankin Buzzard, NP [place] [facility], [facility]

## 2023-03-11 ENCOUNTER — Ambulatory Visit (INDEPENDENT_AMBULATORY_CARE_PROVIDER_SITE_OTHER): Payer: Medicare HMO

## 2023-03-11 DIAGNOSIS — Z0000 Encounter for general adult medical examination without abnormal findings: Secondary | ICD-10-CM

## 2023-03-11 DIAGNOSIS — Z1211 Encounter for screening for malignant neoplasm of colon: Secondary | ICD-10-CM

## 2023-03-11 NOTE — Patient Instructions (Addendum)
Mr. Elijah Mcfarland , Thank you for taking time to come for your Medicare Wellness Visit. I appreciate your ongoing commitment to your health goals. Please review the following plan we discussed and let me know if I can assist you in the future.   Referrals/Orders/Follow-Ups/Clinician Recommendations: REFERRAL FOR COLONOSCOPY SENT  This is a list of the screening recommended for you and due dates:  Health Maintenance  Topic Date Due   Zoster (Shingles) Vaccine (1 of 2) Never done   DTaP/Tdap/Td vaccine (2 - Tdap) 09/07/2018   COVID-19 Vaccine (4 - 2024-25 season) 10/17/2022   Colon Cancer Screening  02/27/2023   Screening for Lung Cancer  05/27/2023   Medicare Annual Wellness Visit  03/10/2024   Pneumonia Vaccine  Completed   Flu Shot  Completed   Hepatitis C Screening  Completed   HPV Vaccine  Aged Out    Advanced directives: (ACP Link)Information on Advanced Care Planning can be found at [facility] Advance Health Care Directives Advance Health Care Directives ([email or web address]   Next Medicare Annual Wellness Visit scheduled for next year: Yes   03/16/24 @ 3:20 PM BY PHONE

## 2023-03-11 NOTE — Progress Notes (Signed)
Subjective:   Elijah Mcfarland is a 73 y.o. male who presents for Medicare Annual/Subsequent preventive examination.  Visit Complete: Virtual I connected with  Haidar Muse on 03/11/23 by a audio enabled telemedicine application and verified that I am speaking with the correct person using two identifiers.  This patient declined Interactive audio and Acupuncturist. Therefore the visit was completed with audio only.   Patient Location: Home  Provider Location: Office/Clinic  I discussed the limitations of evaluation and management by telemedicine. The patient expressed understanding and agreed to proceed.  Vital Signs: Because this visit was a virtual/telehealth visit, some criteria may be missing or patient reported. Any vitals not documented were not able to be obtained and vitals that have been documented are patient reported.  Cardiac Risk Factors include: advanced age (>68men, >71 women);hypertension;male gender;sedentary lifestyle     Objective:    There were no vitals filed for this visit. There is no height or weight on file to calculate BMI.     03/11/2023    3:27 PM 01/01/2022   11:36 AM 02/27/2020    8:39 AM 03/09/2018    1:28 PM 03/06/2018    7:21 PM 03/06/2018    5:44 PM 03/06/2018   11:22 AM  Advanced Directives  Does Patient Have a Medical Advance Directive? No No No No No No No  Would patient like information on creating a medical advance directive? No - Patient declined No - Patient declined  No - Patient declined No - Patient declined No - Patient declined No - Patient declined    Current Medications (verified) Outpatient Encounter Medications as of 03/11/2023  Medication Sig   acetaminophen (TYLENOL) 325 MG tablet Take 2 tablets (650 mg total) by mouth every 6 (six) hours as needed for mild pain or headache (or Fever >/= 101).   albuterol (PROVENTIL) (2.5 MG/3ML) 0.083% nebulizer solution Take 3 mLs (2.5 mg total) by nebulization every 6 (six) hours  as needed for wheezing or shortness of breath.   albuterol (VENTOLIN HFA) 108 (90 Base) MCG/ACT inhaler INHALE 2 PUFFS INTO THE LUNGS EVERY 6 HOURS AS NEEDED FOR WHEEZING OR SHORTNESS OF BREATH   ALPRAZolam (XANAX) 0.5 MG tablet TAKE 1 TABLET(0.5 MG) BY MOUTH THREE TIMES DAILY   aspirin EC 81 MG tablet Take 1 tablet (81 mg total) by mouth daily. Swallow whole.   atorvastatin (LIPITOR) 40 MG tablet Take 1 tablet (40 mg total) by mouth daily.   Budeson-Glycopyrrol-Formoterol (BREZTRI AEROSPHERE) 160-9-4.8 MCG/ACT AERO Inhale 2 puffs into the lungs in the morning and at bedtime.   cetirizine (ZYRTEC) 10 MG tablet Take 10 mg by mouth as needed for allergies.   citalopram (CELEXA) 40 MG tablet TAKE 1 TABLET EVERY DAY   Ferrous Sulfate (IRON) 325 (65 Fe) MG TABS Take 1 tablet by mouth in the morning and at bedtime.   furosemide (LASIX) 20 MG tablet Take 1 tablet (20 mg total) by mouth daily.   guaiFENesin (MUCINEX) 600 MG 12 hr tablet Take 1,200 mg by mouth 2 (two) times daily.   losartan (COZAAR) 50 MG tablet TAKE 1/2 TABLET(25 MG) BY MOUTH DAILY   nicotine polacrilex (NICORETTE) 4 MG gum Take 4 mg by mouth as needed for smoking cessation.   OXYGEN Inhale 2 L into the lungs daily as needed (with activity). Wears nightly   phenytoin (DILANTIN) 100 MG ER capsule TAKE 3 CAPSULES EVERY DAY   tamsulosin (FLOMAX) 0.4 MG CAPS capsule TAKE 1 CAPSULE(0.4 MG) BY MOUTH DAILY  No facility-administered encounter medications on file as of 03/11/2023.    Allergies (verified) Nsaids   History: Past Medical History:  Diagnosis Date   Chronic pain syndrome    Left knee   COPD (chronic obstructive pulmonary disease) (HCC)    DDD (degenerative disc disease), lumbar 11/2008   L5-S1 on plain film   Emphysema lung (HCC)    GAD (generalized anxiety disorder)    GERD (gastroesophageal reflux disease)    History of multiple pulmonary nodules 07/2010   Follow up CT scans showed resolution by 03/2011--NO MALIGNANCY.    History of pneumonia 06/2010   HTN (hypertension) 01/07/2014   Osteoarthritis of left knee    + past injury of this knee--tibia fracture?  +left leg shorter than right   Seizure (HCC)    Seizure disorder (HCC)    3 total seizures (first was 1995); neuro eval done and recommended lifetime phenytoin (he had a seizure after coming off the med in the late 90s   Tobacco dependence    Past Surgical History:  Procedure Laterality Date   COLONOSCOPY  2010   At [facility]; normal per pt   COLONOSCOPY WITH PROPOFOL N/A 09/28/2017   Procedure: COLONOSCOPY WITH PROPOFOL;  Surgeon: Wyline Mood, MD;  Location: [facility] ENDOSCOPY;  Service: Gastroenterology;  Laterality: N/A;   COLONOSCOPY WITH PROPOFOL N/A 10/13/2017   Procedure: COLONOSCOPY WITH PROPOFOL;  Surgeon: Toney Reil, MD;  Location: [facility] ENDOSCOPY;  Service: Gastroenterology;  Laterality: N/A;   COLONOSCOPY WITH PROPOFOL N/A 02/27/2020   Procedure: COLONOSCOPY WITH PROPOFOL;  Surgeon: Toney Reil, MD;  Location: [facility] ENDOSCOPY;  Service: Gastroenterology;  Laterality: N/A;   ESOPHAGOGASTRODUODENOSCOPY N/A 03/01/2018   Procedure: ESOPHAGOGASTRODUODENOSCOPY (EGD);  Surgeon: Malissa Hippo, MD;  Location: [facility] ENDO SUITE;  Service: Endoscopy;  Laterality: N/A;   ESOPHAGOGASTRODUODENOSCOPY N/A 03/06/2018   Procedure: ESOPHAGOGASTRODUODENOSCOPY (EGD);  Surgeon: Malissa Hippo, MD;  Location: [facility] ENDO SUITE;  Service: Endoscopy;  Laterality: N/A;   ESOPHAGOGASTRODUODENOSCOPY N/A 03/09/2018   Procedure: ESOPHAGOGASTRODUODENOSCOPY (EGD);  Surgeon: Malissa Hippo, MD;  Location: [facility] ENDO SUITE;  Service: Endoscopy;  Laterality: N/A;   GIVENS CAPSULE STUDY N/A 03/08/2018   Procedure: GIVENS CAPSULE STUDY;  Surgeon: Malissa Hippo, MD;  Location: [facility] ENDO SUITE;  Service: Endoscopy;  Laterality: N/A;   KNEE SURGERY     Left : x 2   Family History  Problem Relation Age of Onset   Heart disease Mother    Heart disease Father    Cancer Sister     Cirrhosis Sister    Cirrhosis Brother    Liver cancer Cousin        Materinal Side   Stroke Neg Hx    Social History   Socioeconomic History   Marital status: Married    Spouse name: Not on file   Number of children: Not on file   Years of education: Not on file   Highest education level: Not on file  Occupational History   Not on file  Tobacco Use   Smoking status: Every Day    Current packs/day: 0.50    Average packs/day: 3.0 packs/day for 54.1 years (159.5 ttl pk-yrs)    Types: Cigarettes    Start date: 46   Smokeless tobacco: Never   Tobacco comments:    10 cigarettes daily--08/27/2022 hfb  Vaping Use   Vaping status: Never Used  Substance and Sexual Activity   Alcohol use: No    Alcohol/week: 0.0 standard drinks of alcohol  Drug use: Not Currently    Types: Marijuana    Comment: 2 month   Sexual activity: Yes    Partners: Male  Other Topics Concern   Not on file  Social History Narrative   Married, 2 grown children   Eighth grade education.  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   Originally from [place].   Tobacco 100 pack-yr hx, still smoking as of 10/2011 (1 pack per day).   Denies alcohol or drug use.   No exercise.   Social Drivers of Corporate investment banker Strain: Low Risk  (03/11/2023)   Overall Financial Resource Strain (CARDIA)    Difficulty of Paying Living Expenses: Not hard at all  Food Insecurity: No Food Insecurity (03/11/2023)   Hunger Vital Sign    Worried About Running Out of Food in the Last Year: Never true    Ran Out of Food in the Last Year: Never true  Transportation Needs: No Transportation Needs (03/11/2023)   PRAPARE - Administrator, Civil Service (Medical): No    Lack of Transportation (Non-Medical): No  Physical Activity: Sufficiently Active (03/11/2023)   Exercise Vital Sign    Days of Exercise per Week: 5 days    Minutes of Exercise per Session: 30 min  Stress: No Stress Concern  Present (03/11/2023)   [employer] of Occupational Health - Occupational Stress Questionnaire    Feeling of Stress : Not at all  Social Connections: Moderately Isolated (03/11/2023)   Social Connection and Isolation Panel [NHANES]    Frequency of Communication with Friends and Family: More than three times a week    Frequency of Social Gatherings with Friends and Family: More than three times a week    Attends Religious Services: Never    Database administrator or Organizations: No    Attends Engineer, structural: Never    Marital Status: Married    Tobacco Counseling Ready to quit: Not Answered Counseling given: Not Answered Tobacco comments: 10 cigarettes daily--08/27/2022 hfb   Clinical Intake:  Pre-visit preparation completed: Yes  Pain : No/denies pain     Nutritional Status: BMI <19  Underweight Nutritional Risks: None Diabetes: No  How often do you need to have someone help you when you read instructions, pamphlets, or other written materials from your doctor or pharmacy?: 1 - Never  Interpreter Needed?: No  Information entered by :: Kennedy Bucker, LPN   Activities of Daily Living    03/11/2023    3:28 PM 09/20/2022    3:05 PM  In your present state of health, do you have any difficulty performing the following activities:  Hearing? 0 1  Vision? 1 1  Difficulty concentrating or making decisions? 0 0  Walking or climbing stairs? 1 1  Dressing or bathing? 0 0  Doing errands, shopping? 0 1  Preparing Food and eating ? N   Using the Toilet? N   In the past six months, have you accidently leaked urine? N   Do you have problems with loss of bowel control? N   Managing your Medications? N   Managing your Finances? N   Housekeeping or managing your Housekeeping? N     Patient Care Team: Lorre Munroe, NP as PCP - General (Internal Medicine) Debbe Odea, MD as PCP - Cardiology (Cardiology) Ronney Asters, Jackelyn Poling, RPH-CPP as  Pharmacist Salena Saner, MD as Consulting Physician (Pulmonary Disease) [facility], [facility])  Indicate any recent  Medical Services you may have received from other than [facility] providers in the past year (date may be approximate).     Assessment:   This is a routine wellness examination for Recardo.  Hearing/Vision screen Hearing Screening - Comments:: NO AIDS Vision Screening - Comments:: LEGALLY BLIND, WET [facility]- [facility]   Goals Addressed             This Visit's Progress    DIET - INCREASE WATER INTAKE         Depression Screen    03/11/2023    3:25 PM 09/20/2022    3:04 PM 03/15/2022    2:43 PM 01/01/2022   11:34 AM 03/10/2021    1:25 PM 02/06/2020    4:11 PM 02/05/2020   12:10 PM  PHQ 2/9 Scores  PHQ - 2 Score 2 0 4 0 1 0 0  PHQ- 9 Score 3  8 0 7 0     Fall Risk    03/11/2023    3:28 PM 09/20/2022    3:05 PM 03/15/2022    2:43 PM 01/01/2022   11:37 AM 03/10/2021    1:26 PM  Fall Risk   Falls in the past year? 0 0 0 0 0  Number falls in past yr: 0   0 0  Injury with Fall? 0 0 0 0 0  Risk for fall due to : No Fall Risks No Fall Risks No Fall Risks No Fall Risks No Fall Risks  Follow up Falls prevention discussed;Falls evaluation completed   Falls prevention discussed;Falls evaluation completed Falls evaluation completed    MEDICARE RISK AT HOME: Medicare Risk at Home Any stairs in or around the home?: Yes If so, are there any without handrails?: No Home free of loose throw rugs in walkways, pet beds, electrical cords, etc?: Yes Adequate lighting in your home to reduce risk of falls?: Yes Life alert?: No Use of a cane, walker or w/c?: No Grab bars in the bathroom?: No Shower chair or bench in shower?: No Elevated toilet seat or a handicapped toilet?: No  TIMED UP AND GO:  Was the test performed?  No    Cognitive Function:        03/11/2023    3:29 PM 01/01/2022   11:37 AM  6CIT Screen  What Year? 0 points 0 points  What month? 0  points 0 points  What time? 0 points 0 points  Count back from 20 0 points 0 points  Months in reverse 0 points 0 points  Repeat phrase 0 points 0 points  Total Score 0 points 0 points    Immunizations Immunization History  Administered Date(s) Administered   Fluad Quad(high Dose 65+) 11/17/2018, 02/05/2020, 03/10/2021, 03/15/2022   Fluad Trivalent(High Dose 65+) 12/21/2022   H1N1 01/05/2008   Influenza Whole 11/07/2007   Influenza, High Dose Seasonal PF 03/03/2018   Influenza, Seasonal, Injecte, Preservative Fre 04/13/2013   Influenza,inj,Quad PF,6+ Mos 11/14/2014, 02/19/2016, 02/18/2017   Moderna SARS-COV2 Booster Vaccination 12/26/2019   Moderna Sars-Covid-2 Vaccination 04/22/2019, 05/20/2019   Pneumococcal Conjugate-13 11/14/2014, 02/05/2020   Pneumococcal Polysaccharide-23 11/07/2007, 02/19/2016, 03/03/2018   Td 09/06/2008    TDAP status: Due, Education has been provided regarding the importance of this vaccine. Advised may receive this vaccine at local pharmacy or Health Dept. Aware to provide a copy of the vaccination record if obtained from local pharmacy or Health Dept. Verbalized acceptance and understanding.  Flu Vaccine status: Up to date  Pneumococcal vaccine status: Up to date  Covid-19 vaccine status: Completed vaccines  Qualifies for Shingles Vaccine? Yes   Zostavax completed No   Shingrix Completed?: No.    Education has been provided regarding the importance of this vaccine. Patient has been advised to call insurance company to determine out of pocket expense if they have not yet received this vaccine. Advised may also receive vaccine at local pharmacy or Health Dept. Verbalized acceptance and understanding.  Screening Tests Health Maintenance  Topic Date Due   Zoster Vaccines- Shingrix (1 of 2) Never done   DTaP/Tdap/Td (2 - Tdap) 09/07/2018   COVID-19 Vaccine (4 - 2024-25 season) 10/17/2022   Colonoscopy  02/27/2023   Lung Cancer Screening  05/27/2023    Medicare Annual Wellness (AWV)  03/10/2024   Pneumonia Vaccine 63+ Years old  Completed   INFLUENZA VACCINE  Completed   Hepatitis C Screening  Completed   HPV VACCINES  Aged Out    Health Maintenance  Health Maintenance Due  Topic Date Due   Zoster Vaccines- Shingrix (1 of 2) Never done   DTaP/Tdap/Td (2 - Tdap) 09/07/2018   COVID-19 Vaccine (4 - 2024-25 season) 10/17/2022   Colonoscopy  02/27/2023    Colorectal cancer screening: Type of screening: Colonoscopy. Completed 02/27/20. Repeat every 3 years  Lung Cancer Screening: (Low Dose CT Chest recommended if Age 76-80 years, 20 pack-year currently smoking OR have quit w/in 15years.) does qualify.   Lung Cancer Screening Referral: CT SCAN SCHEDULED 03/25/23  Additional Screening:  Hepatitis C Screening: does qualify; Completed 04/13/13  Vision Screening: Recommended annual ophthalmology exams for early detection of glaucoma and other disorders of the eye. Is the patient up to date with their annual eye exam?  Yes  Who is the provider or what is the name of the office in which the patient attends annual eye exams? [facility] If pt is not established with a provider, would they like to be referred to a provider to establish care? No .   Dental Screening: Recommended annual dental exams for proper oral hygiene   Community Resource Referral / Chronic Care Management: CRR required this visit?  No   CCM required this visit?  No     Plan:     I have personally reviewed and noted the following in the patient's chart:   Medical and social history Use of alcohol, tobacco or illicit drugs  Current medications and supplements including opioid prescriptions. Patient is not currently taking opioid prescriptions. Functional ability and status Nutritional status Physical activity Advanced directives List of other physicians Hospitalizations, surgeries, and ER visits in previous 12 months Vitals Screenings to include  cognitive, depression, and falls Referrals and appointments  In addition, I have reviewed and discussed with patient certain preventive protocols, quality metrics, and best practice recommendations. A written personalized care plan for preventive services as well as general preventive health recommendations were provided to patient.     Hal Hope, LPN   10/30/7827   After Visit Summary: (MyChart) Due to this being a telephonic visit, the after visit summary with patients personalized plan was offered to patient via MyChart   Nurse Notes: REFERRAL SENT FOR COLONOSCOPY

## 2023-03-14 ENCOUNTER — Telehealth: Payer: Self-pay | Admitting: *Deleted

## 2023-03-14 ENCOUNTER — Other Ambulatory Visit: Payer: Self-pay

## 2023-03-14 ENCOUNTER — Telehealth: Payer: Self-pay

## 2023-03-14 DIAGNOSIS — Z860100 Personal history of colon polyps, unspecified: Secondary | ICD-10-CM

## 2023-03-14 MED ORDER — PEG 3350-KCL-NA BICARB-NACL 420 G PO SOLR: 4000.0000 mL | Freq: Once | ORAL | 0 refills | Status: AC

## 2023-03-14 NOTE — Telephone Encounter (Signed)
Pt has been scheduled tele preop appt 03/30/23. Med rec and consent are done.     Patient Consent for Virtual Visit        Elijah Mcfarland has provided verbal consent on 03/14/2023 for a virtual visit (video or telephone).   CONSENT FOR VIRTUAL VISIT FOR:  Elijah Mcfarland  By participating in this virtual visit I agree to the following:  I hereby voluntarily request, consent and authorize [facility] and its employed or contracted physicians, physician assistants, nurse practitioners or other licensed health care professionals (the Practitioner), to provide me with telemedicine health care services (the "Services") as deemed necessary by the treating Practitioner. I acknowledge and consent to receive the Services by the Practitioner via telemedicine. I understand that the telemedicine visit will involve communicating with the Practitioner through live audiovisual communication technology and the disclosure of certain medical information by electronic transmission. I acknowledge that I have been given the opportunity to request an in-person assessment or other available alternative prior to the telemedicine visit and am voluntarily participating in the telemedicine visit.  I understand that I have the right to withhold or withdraw my consent to the use of telemedicine in the course of my care at any time, without affecting my right to future care or treatment, and that the Practitioner or I may terminate the telemedicine visit at any time. I understand that I have the right to inspect all information obtained and/or recorded in the course of the telemedicine visit and may receive copies of available information for a reasonable fee.  I understand that some of the potential risks of receiving the Services via telemedicine include:  Delay or interruption in medical evaluation due to technological equipment failure or disruption; Information transmitted may not be sufficient (e.g. poor resolution  of images) to allow for appropriate medical decision making by the Practitioner; and/or  In rare instances, security protocols could fail, causing a breach of personal health information.  Furthermore, I acknowledge that it is my responsibility to provide information about my medical history, conditions and care that is complete and accurate to the best of my ability. I acknowledge that Practitioner's advice, recommendations, and/or decision may be based on factors not within their control, such as incomplete or inaccurate data provided by me or distortions of diagnostic images or specimens that may result from electronic transmissions. I understand that the practice of medicine is not an exact science and that Practitioner makes no warranties or guarantees regarding treatment outcomes. I acknowledge that a copy of this consent can be made available to me via my patient portal [facility] MyChart), or I can request a printed copy by calling the office of [facility].    I understand that my insurance will be billed for this visit.   I have read or had this consent read to me. I understand the contents of this consent, which adequately explains the benefits and risks of the Services being provided via telemedicine.  I have been provided ample opportunity to ask questions regarding this consent and the Services and have had my questions answered to my satisfaction. I give my informed consent for the services to be provided through the use of telemedicine in my medical care

## 2023-03-14 NOTE — Telephone Encounter (Signed)
Gastroenterology Pre-Procedure Review  Request Date: 04/14/23 Requesting Physician: Dr. Allegra Lai  PATIENT REVIEW QUESTIONS: The patient responded to the following health history questions as indicated:    1. Are you having any GI issues? no 2. Do you have a personal history of Polyps? yes (last colonoscopy performed by Dr. Allegra Lai 02/27/20 recommended repeat in 3 years) 3. Do you have a family history of Colon Cancer or Polyps? no 4. Diabetes Mellitus? no 5. Joint replacements in the past 12 months?no 6. Major health problems in the past 3 months?no 7. Any artificial heart valves, MVP, or defibrillator?no 8. Cardiac Issues? CHF Cardiac clearance sent to CV Preop Team (Dr. Myriam Forehand) MEDICATIONS & ALLERGIES:    Patient reports the following regarding taking any anticoagulation/antiplatelet therapy:   Plavix, Coumadin, Eliquis, Xarelto, Lovenox, Pradaxa, Brilinta, or Effient? no Aspirin? no  Patient confirms/reports the following medications:  Current Outpatient Medications  Medication Sig Dispense Refill   acetaminophen (TYLENOL) 325 MG tablet Take 2 tablets (650 mg total) by mouth every 6 (six) hours as needed for mild pain or headache (or Fever >/= 101). 30 tablet 2   albuterol (PROVENTIL) (2.5 MG/3ML) 0.083% nebulizer solution Take 3 mLs (2.5 mg total) by nebulization every 6 (six) hours as needed for wheezing or shortness of breath. 75 mL 12   albuterol (VENTOLIN HFA) 108 (90 Base) MCG/ACT inhaler INHALE 2 PUFFS INTO THE LUNGS EVERY 6 HOURS AS NEEDED FOR WHEEZING OR SHORTNESS OF BREATH 6.7 g 5   ALPRAZolam (XANAX) 0.5 MG tablet TAKE 1 TABLET(0.5 MG) BY MOUTH THREE TIMES DAILY 90 tablet 0   aspirin EC 81 MG tablet Take 1 tablet (81 mg total) by mouth daily. Swallow whole. 90 tablet 3   atorvastatin (LIPITOR) 40 MG tablet Take 1 tablet (40 mg total) by mouth daily. 90 tablet 3   Budeson-Glycopyrrol-Formoterol (BREZTRI AEROSPHERE) 160-9-4.8 MCG/ACT AERO Inhale 2 puffs into the lungs in the  morning and at bedtime. 32.1 g 3   cetirizine (ZYRTEC) 10 MG tablet Take 10 mg by mouth as needed for allergies.     citalopram (CELEXA) 40 MG tablet TAKE 1 TABLET EVERY DAY 90 tablet 1   Ferrous Sulfate (IRON) 325 (65 Fe) MG TABS Take 1 tablet by mouth in the morning and at bedtime.     furosemide (LASIX) 20 MG tablet Take 1 tablet (20 mg total) by mouth daily.     guaiFENesin (MUCINEX) 600 MG 12 hr tablet Take 1,200 mg by mouth 2 (two) times daily.     losartan (COZAAR) 50 MG tablet TAKE 1/2 TABLET(25 MG) BY MOUTH DAILY 45 tablet 0   nicotine polacrilex (NICORETTE) 4 MG gum Take 4 mg by mouth as needed for smoking cessation.     OXYGEN Inhale 2 L into the lungs daily as needed (with activity). Wears nightly     phenytoin (DILANTIN) 100 MG ER capsule TAKE 3 CAPSULES EVERY DAY 270 capsule 3   tamsulosin (FLOMAX) 0.4 MG CAPS capsule TAKE 1 CAPSULE(0.4 MG) BY MOUTH DAILY 90 capsule 0   No current facility-administered medications for this visit.    Patient confirms/reports the following allergies:  Allergies  Allergen Reactions   Nsaids Other (See Comments)    GI bleed and vomiting    No orders of the defined types were placed in this encounter.   AUTHORIZATION INFORMATION Primary Insurance: 1D#: Group #:  Secondary Insurance: 1D#: Group #:  SCHEDULE INFORMATION: Date: 04/14/23 Time: Location: [facility]

## 2023-03-14 NOTE — Telephone Encounter (Signed)
   Pre-operative Risk Assessment    Patient Name: Elijah Mcfarland  DOB: 07/31/1950 MRN: [id number]   Date of last office visit: 01/03/23 DR. AGBOR-ETANG Date of next office visit: NONE   Request for Surgical Clearance    Procedure:  COLONOSCOPY  Date of Surgery:  Clearance 04/14/23                                Surgeon:  DR. Lannette Donath Surgeon's Group or Practice Name:  [facility] Phone number:  [phone] Fax number:  [phone]   Type of Clearance Requested:   - Medical ; NONE TO BE HELD PER CLEARANCE FORM   Type of Anesthesia:  General    Additional requests/questions:    Elpidio Anis   03/14/2023, 10:55 AM

## 2023-03-14 NOTE — Telephone Encounter (Signed)
Pt requesting call back to schedule colonoscopy.

## 2023-03-14 NOTE — Telephone Encounter (Signed)
   Name: Elijah Mcfarland  DOB: December 12, 1950  MRN: [id number]  Primary Cardiologist: Debbe Odea, MD  Preoperative team, please contact this patient and set up a phone call appointment for further preoperative risk assessment. Please obtain consent and complete medication review. Thank you for your help.  I confirm that guidance regarding antiplatelet and oral anticoagulation therapy has been completed and, if necessary, noted below. He can hold ASA x 7 days.  I also confirmed the patient resides in the state of [place]. As per [facility] telemedicine laws, the patient must reside in the state in which the provider is licensed.   Tereso Newcomer, PA-C 03/14/2023, 12:45 PM [facility]

## 2023-03-14 NOTE — Telephone Encounter (Signed)
Pt has been scheduled tele preop appt 03/30/23. Med rec and consent are done.

## 2023-03-17 ENCOUNTER — Encounter: Payer: Medicare HMO | Admitting: Internal Medicine

## 2023-03-17 ENCOUNTER — Other Ambulatory Visit: Payer: Self-pay | Admitting: Internal Medicine

## 2023-03-17 NOTE — Telephone Encounter (Signed)
Medication Refill -  Most Recent Primary Care Visit:  Provider: Hal Hope  Department: [facility]  Visit Type: MEDICARE AWV, SEQUENTIAL  Date: 03/11/2023  Medication: ALPRAZolam (XANAX) 0.5 MG tablet [[id number]]   Has the patient contacted their pharmacy? Yes  (Agent: If yes, when and what did the pharmacy advise?) contact office   Is this the correct pharmacy for this prescription? Yes  This is the patient's preferred pharmacy:  [id number] - [place], [place] - [address] [place] [address] Phone: [phone] Fax: [phone]    Has the prescription been filled recently? Yes  Is the patient out of the medication? No  Has the patient been seen for an appointment in the last year OR does the patient have an upcoming appointment? Yes  Can we respond through MyChart? No  Agent: Please be advised that Rx refills may take up to 3 business days. We ask that you follow-up with your pharmacy.

## 2023-03-18 ENCOUNTER — Telehealth: Payer: Self-pay | Admitting: Pharmacy Technician

## 2023-03-18 DIAGNOSIS — Z5986 Financial insecurity: Secondary | ICD-10-CM

## 2023-03-18 MED ORDER — ALPRAZOLAM 0.5 MG PO TABS: ORAL_TABLET | ORAL | 0 refills | Status: DC

## 2023-03-18 NOTE — Progress Notes (Signed)
Pharmacy Medication Assistance Program Note    03/18/2023  Patient ID: Elijah Mcfarland, male   DOB: [birth year], 73 y.o.   MRN: [id number]     01/03/2023 03/18/2023  Outreach Medication One  Initial Outreach Date (Medication One) 01/03/2023   Manufacturer Medication One [employer]   [employer] [facility] [name]   Type of Radiographer, therapeutic Assistance   Date Application Sent to Patient 01/05/2023   Application Items Requested Application;Proof of Income;Other   Date Application Sent to Prescriber 01/05/2023   Name of Prescriber Sarina Ser   Date Application Received From Patient  03/16/2023  Application Items Received From Patient  Application;Proof of Income;Other  Date Application Received From Provider  01/05/2023  Date Application Submitted to Manufacturer  03/18/2023  Method Application Sent to Manufacturer  Fax     Elijah Mcfarland, CPhT [place]  Office: [phone] Fax: [phone] Email: [email or web address]

## 2023-03-18 NOTE — Telephone Encounter (Signed)
Requested medication (s) are due for refill today - yes  Requested medication (s) are on the active medication list -yes  Future visit scheduled -yes  Last refill: 02/18/23 #90  Notes to clinic: non delegated Rx  Requested Prescriptions  Pending Prescriptions Disp Refills   ALPRAZolam (XANAX) 0.5 MG tablet 90 tablet 0     Not Delegated - Psychiatry: Anxiolytics/Hypnotics 2 Failed - 03/18/2023  9:06 AM      Failed - This refill cannot be delegated      Failed - Urine Drug Screen completed in last 360 days      Passed - Patient is not pregnant      Passed - Valid encounter within last 6 months    Recent Outpatient Visits           2 months ago Stage 3 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 (HCC)   [place] [facility] Delles, Gentry Fitz A, RPH-CPP   5 months ago Aortic atherosclerosis [facility])   [place] [facility] [place], Salvadore Oxford, NP   7 months ago Stage 3 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 [facility])   [place] [facility] Delles, Jackelyn Poling, RPH-CPP   1 year ago Encounter for annual general medical examination with abnormal findings in adult   [facility] [place], Salvadore Oxford, NP   1 year ago Stage 3 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 [facility])   [facility] Delles, Jackelyn Poling, RPH-CPP       Future Appointments             In 6 days Salena Saner, MD [facility] [place] [facility] [place]   In 3 weeks Baity, Salvadore Oxford, NP [place] [facility]               Requested Prescriptions  Pending Prescriptions Disp Refills   ALPRAZolam Prudy Feeler) 0.5 MG tablet 90 tablet 0     Not Delegated - Psychiatry: Anxiolytics/Hypnotics 2 Failed - 03/18/2023  9:06 AM      Failed - This refill cannot be delegated      Failed - Urine Drug Screen completed in last 360 days      Passed - Patient is not pregnant      Passed - Valid encounter within last  6 months    Recent Outpatient Visits           2 months ago Stage 3 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 [facility])   [place] [facility] Delles, Gentry Fitz A, RPH-CPP   5 months ago Aortic atherosclerosis [facility])   [place] [facility] [place], Salvadore Oxford, NP   7 months ago Stage 3 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 [facility])   [place] [facility] Delles, Jackelyn Poling, RPH-CPP   1 year ago Encounter for annual general medical examination with abnormal findings in adult   [facility] [place], Salvadore Oxford, NP   1 year ago Stage 3 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 [facility])   [place] [facility] Delles, Jackelyn Poling, RPH-CPP       Future Appointments             In 6 days Salena Saner, MD [facility] [place] [facility] [place]   In 3 weeks Baity, Salvadore Oxford, NP  [facility] [place]

## 2023-03-21 ENCOUNTER — Encounter: Payer: Medicare HMO | Admitting: Internal Medicine

## 2023-03-21 ENCOUNTER — Other Ambulatory Visit: Payer: Medicare HMO | Admitting: Pharmacist

## 2023-03-21 DIAGNOSIS — J449 Chronic obstructive pulmonary disease, unspecified: Secondary | ICD-10-CM

## 2023-03-21 NOTE — Patient Instructions (Signed)
Goals Addressed             This Visit's Progress    Pharmacy Goals       Please use your Breztri 2 puffs twice a day.  Make sure you rinse your mouth well after you use it.  You can contact AZ[employer]Me at [phone] to follow up about refills of Breztri  Please consider calling the [place] Quitline for their help with quitting smoking.  The [place] Quitline phone number is: [phone]  Feel free to call me with any questions or concerns. I look forward to our next call!  Estelle Grumbles, PharmD, [facility] Clinical Pharmacist [facility] [phone]

## 2023-03-21 NOTE — Progress Notes (Signed)
03/21/2023 Name: Elijah Mcfarland MRN: [id number] DOB: 08-Aug-1950  Chief Complaint  Patient presents with   Medication Assistance    Elijah Mcfarland is a 73 y.o. year old male who presented for a telephone visit.   They were referred to the pharmacist by their PCP for assistance in managing medication access.      Subjective:   Care Team: Primary Care Provider: Lorre Munroe, NP; Next Scheduled Visit: 04/11/2023 Pulmonologist: Salena Saner, MD; Next Scheduled Visit: 03/24/2023 Cardiologist: [facility] [place]  Medication Access/Adherence  Current Pharmacy:  [facility] [phone] - [place], [place] - [address] AT [address]  [place] [address] Phone: [phone] Fax: [phone]  [facility] - [place], [place] - [address] [place] [address] Phone: [phone] Fax: [phone]  [facility] - [place], [place] - [address] [place] [address] Phone: [phone] Fax: [phone]   Patient reports affordability concerns with their medications: No  Patient reports access/transportation concerns to their pharmacy: No  Patient reports adherence concerns with their medications:  No       COPD: Patient followed by [facility] [place]   Current treatment: Breztri - 2 puffs twice daily Albuterol as needed   Confirms using Breztri inhaler twice daily (morning and evening) and rinsing mouth out after use   Current medication access: Collaborating with Pulmonologist and CPhT to support patient with re-enrollment in patient assistance program for [place] for 2025 calendar year - Reports currently has 1 month supply of Breztri remaining - From review of chart, note CPhT faxed application to [employer] on 03/18/2023   Hypertension/HFpEF:   Current medications:  ACEi/ARB/ARNI: losartan 50 mg - 1/2 tablet (25 mg)  daily Diuretic regimen: furosemide 20 mg daily as needed   Denies monitoring home blood pressure recently   Denies symptoms of hypotension such as dizziness or lightheadedness      Objective:   Lab Results  Component Value Date   CREATININE 0.71 05/05/2022   BUN 9 05/05/2022   NA 139 05/05/2022   K 3.8 05/05/2022   CL 103 05/05/2022   CO2 27 05/05/2022    Lab Results  Component Value Date   CHOL 164 08/27/2022   HDL 55 08/27/2022   LDLCALC 83 08/27/2022   TRIG 129 08/27/2022   CHOLHDL 3.0 08/27/2022   BP Readings from Last 3 Encounters:  01/03/23 124/82  12/21/22 102/60  09/20/22 128/64   ] Pulse Readings from Last 3 Encounters:  01/03/23 82  12/21/22 71  09/20/22 95     Current Outpatient Medications on File Prior to Visit  Medication Sig Dispense Refill   acetaminophen (TYLENOL) 325 MG tablet Take 2 tablets (650 mg total) by mouth every 6 (six) hours as needed for mild pain or headache (or Fever >/= 101). 30 tablet 2   albuterol (PROVENTIL) (2.5 MG/3ML) 0.083% nebulizer solution Take 3 mLs (2.5 mg total) by nebulization every 6 (six) hours as needed for wheezing or shortness of breath. 75 mL 12   albuterol (VENTOLIN HFA) 108 (90 Base) MCG/ACT inhaler INHALE 2 PUFFS INTO THE LUNGS EVERY 6 HOURS AS NEEDED FOR WHEEZING OR SHORTNESS OF BREATH 6.7 g 5   ALPRAZolam (XANAX) 0.5 MG tablet TAKE 1 TABLET(0.5 MG) BY MOUTH THREE TIMES DAILY 90 tablet 0   aspirin EC 81 MG tablet Take 1 tablet (81 mg total) by  mouth daily. Swallow whole. 90 tablet 3   atorvastatin (LIPITOR) 40 MG tablet Take 1 tablet (40 mg total) by mouth daily. 90 tablet 3   Budeson-Glycopyrrol-Formoterol (BREZTRI AEROSPHERE) 160-9-4.8 MCG/ACT AERO Inhale 2 puffs into the lungs in the morning and at bedtime. 32.1 g 3   cetirizine (ZYRTEC) 10 MG tablet Take 10 mg by mouth as needed for allergies.     citalopram (CELEXA) 40 MG tablet TAKE 1 TABLET EVERY DAY 90 tablet 1   Ferrous Sulfate (IRON) 325 (65 Fe) MG  TABS Take 1 tablet by mouth in the morning and at bedtime.     furosemide (LASIX) 20 MG tablet Take 1 tablet (20 mg total) by mouth daily.     guaiFENesin (MUCINEX) 600 MG 12 hr tablet Take 1,200 mg by mouth 2 (two) times daily.     losartan (COZAAR) 50 MG tablet TAKE 1/2 TABLET(25 MG) BY MOUTH DAILY 45 tablet 0   nicotine polacrilex (NICORETTE) 4 MG gum Take 4 mg by mouth as needed for smoking cessation.     OXYGEN Inhale 2 L into the lungs daily as needed (with activity). Wears nightly     phenytoin (DILANTIN) 100 MG ER capsule TAKE 3 CAPSULES EVERY DAY 270 capsule 3   tamsulosin (FLOMAX) 0.4 MG CAPS capsule TAKE 1 CAPSULE(0.4 MG) BY MOUTH DAILY 90 capsule 0   No current facility-administered medications on file prior to visit.        Assessment/Plan:   COPD: - Patient to follow up with [employer]Me program as needed for Breztri refills - Collaborating with Pulmonologist and CPhT to support patient with re-enrollment in patient assistance program for [place] for 2025 calendar year - Discuss Breztri inhaler technique. Ask patient's spouse to support with monitoring day supply remaining based on dose counter on top of inhaler.   Hypertension/HFpEF:   Encourage patient to start monitoring home blood pressure, keep a log of these BP/HR readings and to bring this record with him to medical appointments as recommended by his Cardiologist     Follow Up Plan: Clinical Pharmacist will follow up with patient by telephone on 04/18/2023 at 1:00 PM     Estelle Grumbles, PharmD, [facility] [phone]

## 2023-03-22 NOTE — Telephone Encounter (Signed)
Preop appt for 04/14/23 colonoscopy is scheduled on 03/30/23 with [facility].  Follow up on clearance after 03/30/23.  Thanks,  [place], [place]

## 2023-03-24 ENCOUNTER — Telehealth: Payer: Self-pay | Admitting: Pharmacy Technician

## 2023-03-24 ENCOUNTER — Ambulatory Visit: Payer: Medicare HMO | Admitting: Pulmonary Disease

## 2023-03-24 DIAGNOSIS — Z5986 Financial insecurity: Secondary | ICD-10-CM

## 2023-03-24 NOTE — Progress Notes (Signed)
 Pharmacy Medication Assistance Program Note    03/24/2023  Patient ID: Elijah Mcfarland, male   DOB: 1951/01/15, 73 y.o.   MRN: [id number]     01/03/2023 03/18/2023 03/24/2023  Outreach Medication One  Initial Outreach Date (Medication One) 01/03/2023    Manufacturer Medication One [employer]    [employer] [name]    Type of Radiographer, Therapeutic Assistance    Date Application Sent to Patient 01/05/2023    Application Items Requested Application;Proof of Income;Other    Date Application Sent to Prescriber 01/05/2023    Name of Prescriber Dedra Sanders    Date Application Received From Patient  03/16/2023   Application Items Received From Patient  Application;Proof of Income;Other   Date Application Received From Provider  01/05/2023   Date Application Submitted to Manufacturer  03/18/2023   Method Application Sent to Manufacturer  Fax   Patient Assistance Determination   Approved  Approval Start Date   03/19/2023  Approval End Date   02/15/2024  Additional Outreach Contact   Provider  Contacted Provider   Message   Care coordination call placed to [employer] in regard to Breztri  application.  Per [employer] IVR, patient is APPROVED 03/19/23-02/15/24. Initial fill and subsequent refills will be processed automatically with delivery to the patient's home. Patient may call [employer] at any time to check on next shipments by calling [phone].  Will route note to [facility] PharmD to update patient at next scheduled office visit.  Kate Caddy, CPhT [place]  Office: [phone] Fax: [phone] Email: [email or web address]

## 2023-03-25 ENCOUNTER — Ambulatory Visit
Admission: RE | Admit: 2023-03-25 | Discharge: 2023-03-25 | Disposition: A | Discharge status: Home / Self Care | Payer: Medicare HMO | Source: Ambulatory Visit | Attending: Internal Medicine | Admitting: Internal Medicine

## 2023-03-25 ENCOUNTER — Other Ambulatory Visit: Payer: Self-pay | Admitting: Pulmonary Disease

## 2023-03-25 DIAGNOSIS — Z122 Encounter for screening for malignant neoplasm of respiratory organs: Secondary | ICD-10-CM | POA: Insufficient documentation

## 2023-03-25 DIAGNOSIS — F17210 Nicotine dependence, cigarettes, uncomplicated: Secondary | ICD-10-CM | POA: Insufficient documentation

## 2023-03-25 DIAGNOSIS — Z87891 Personal history of nicotine dependence: Secondary | ICD-10-CM | POA: Diagnosis not present

## 2023-03-30 ENCOUNTER — Ambulatory Visit: Payer: Medicare HMO | Attending: Physician Assistant

## 2023-03-30 DIAGNOSIS — Z01810 Encounter for preprocedural cardiovascular examination: Secondary | ICD-10-CM

## 2023-03-30 NOTE — Progress Notes (Signed)
Virtual Visit via Telephone Note   Because of Alassane Kalafut co-morbid illnesses, he is at least at moderate risk for complications without adequate follow up.  This format is felt to be most appropriate for this patient at this time.  The patient did not have access to video technology/had technical difficulties with video requiring transitioning to audio format only (telephone).  All issues noted in this document were discussed and addressed.  No physical exam could be performed with this format.  Please refer to the patient's chart for his consent to telehealth for [facility].  Evaluation Performed:  Preoperative cardiovascular risk assessment _____________   Date:  03/30/2023   Patient ID:  Elijah Mcfarland, DOB 01-Oct-1950, MRN [id number] Patient Location:  Home Provider location:   Office  Primary Care Provider:  Lorre Munroe, NP Primary Cardiologist:  Debbe Odea, MD  Chief Complaint / Patient Profile   73 y.o. y/o male with a h/o nonobstructive CAD (CCTA 4/24 with mild proximal RCA stenosis, minimal LAD), hypertension, hyperlipidemia, current smoker x 50+ years, COPD on 2 L of oxygen who is pending colonoscopy and presents today for telephonic preoperative cardiovascular risk assessment.  History of Present Illness    Elijah Mcfarland is a 73 y.o. male who presents via audio/video conferencing for a telehealth visit today.  Pt was last seen in cardiology clinic on 01/03/2023 by Dr. Azucena Cecil.  At that time Elijah Mcfarland was doing well.  No chest pain and was euvolemic on exam at that time the patient is now pending procedure as outlined above. Since his last visit, he tells me he has been doing okay from a heart standpoint.  No chest pain.  However, he does struggle with shortness of breath from his COPD.  Is on 2 L at night and with activity.  He is needing to use his albuterol a few times a day in addition to his Breztri inhaler.  Because of this, his activity  is limited and he has not been able to meet 4 METS.   He had an echocardiogram 12/23 which showed normal LVEF.  He did have grade 2 diastolic dysfunction.  Mild to moderate mitral valve disease.  No medications indicated as needing held. Needs clearance from pulmonary.  Past Medical History    Past Medical History:  Diagnosis Date   Chronic pain syndrome    Left knee   COPD (chronic obstructive pulmonary disease) (HCC)    DDD (degenerative disc disease), lumbar 11/2008   L5-S1 on plain film   Emphysema lung (HCC)    GAD (generalized anxiety disorder)    GERD (gastroesophageal reflux disease)    History of multiple pulmonary nodules 07/2010   Follow up CT scans showed resolution by 03/2011--NO MALIGNANCY.   History of pneumonia 06/2010   HTN (hypertension) 01/07/2014   Osteoarthritis of left knee    + past injury of this knee--tibia fracture?  +left leg shorter than right   Seizure (HCC)    Seizure disorder (HCC)    3 total seizures (first was 1995); neuro eval done and recommended lifetime phenytoin (he had a seizure after coming off the med in the late 90s   Tobacco dependence    Past Surgical History:  Procedure Laterality Date   COLONOSCOPY  2010   [facility]; normal per pt   COLONOSCOPY WITH PROPOFOL N/A 09/28/2017   Procedure: COLONOSCOPY WITH PROPOFOL;  Surgeon: Wyline Mood, MD;  [facility];  Service: Gastroenterology;  Laterality: N/A;   COLONOSCOPY  WITH PROPOFOL N/A 10/13/2017   Procedure: COLONOSCOPY WITH PROPOFOL;  Surgeon: Toney Reil, MD;  Location[facility] ENDOSCOPY;  Service: Gastroenterology;  Laterality: N/A;   COLONOSCOPY WITH PROPOFOL N/A 02/27/2020   Procedure: COLONOSCOPY WITH PROPOFOL;  Surgeon: Toney Reil, MD;  Location[facility] END[facility];  Service: Gastroenterology;  Laterality: N/A;   ESOPHAGOGASTRODUODENOSCOPY N/A 03/01/2018   Procedure: ESOPHAGOGASTRODUODENOSCOPY (EGD);  Surgeon: Malissa Hippo, MD;  [facility];  Service:  Endoscopy;  Laterality: N/A;   ESOPHAGOGASTRODUODENOSCOPY N/A 03/06/2018   Procedure: ESOPHAGOGASTRODUODENOSCOPY (EGD);  Surgeon: Malissa Hippo, MD;  Location: [facility];  Service: Endoscopy;  Laterality: N/A;   ESOPHAGOGASTRODUODENOSCOPY N/A 03/09/2018   Procedure: ESOPHAGOGASTRODUODENOSCOPY (EGD);  Surgeon: Malissa Hippo, MD;  Location[facility];  Service: Endoscopy;  Laterality: N/A;   GIVENS CAPSULE STUDY N/A 03/08/2018   Procedure: GIVENS CAPSULE STUDY;  Surgeon: Malissa Hippo, MD;  Location[facility];  Service: Endoscopy;  Laterality: N/A;   KNEE SURGERY     Left : x 2    Allergies  Allergies  Allergen Reactions   Nsaids Other (See Comments)    GI bleed and vomiting    Home Medications    Prior to Admission medications   Medication Sig Start Date End Date Taking? Authorizing Provider  acetaminophen (TYLENOL) 325 MG tablet Take 2 tablets (650 mg total) by mouth every 6 (six) hours as needed for mild pain or headache (or Fever >/= 101). 03/10/18   Shon Hale, MD  albuterol (PROVENTIL) (2.5 MG/3ML) 0.083% nebulizer solution Take 3 mLs (2.5 mg total) by nebulization every 6 (six) hours as needed for wheezing or shortness of breath. 03/18/22   Salena Saner, MD  albuterol (VENTOLIN HFA) 108 (90 Base) MCG/ACT inhaler INHALE 2 PUFFS INTO THE LUNGS EVERY 6 HOURS AS NEEDED FOR WHEEZING OR SHORTNESS OF BREATH 03/25/23   Salena Saner, MD  ALPRAZolam Prudy Feeler) 0.5 MG tablet TAKE 1 TABLET(0.5 MG) BY MOUTH THREE TIMES DAILY 03/18/23   Lorre Munroe, NP  aspirin EC 81 MG tablet Take 1 tablet (81 mg total) by mouth daily. Swallow whole. 05/05/22   Debbe Odea, MD  atorvastatin (LIPITOR) 40 MG tablet Take 1 tablet (40 mg total) by mouth daily. 05/28/22   Debbe Odea, MD  Budeson-Glycopyrrol-Formoterol (BREZTRI AEROSPHERE) 160-9-4.8 MCG/ACT AERO Inhale 2 puffs into the lungs in the morning and at bedtime. 08/08/20   Salena Saner, MD  cetirizine  (ZYRTEC) 10 MG tablet Take 10 mg by mouth as needed for allergies.    [provider]  citalopram (CELEXA) 40 MG tablet TAKE 1 TABLET EVERY DAY 11/26/22   Lorre Munroe, NP  Ferrous Sulfate (IRON) 325 (65 Fe) MG TABS Take 1 tablet by mouth in the morning and at bedtime.    [provider]  furosemide (LASIX) 20 MG tablet Take 1 tablet (20 mg total) by mouth daily. 01/03/23 04/03/23  Debbe Odea, MD  guaiFENesin (MUCINEX) 600 MG 12 hr tablet Take 1,200 mg by mouth 2 (two) times daily.    [provider]  losartan (COZAAR) 50 MG tablet TAKE 1/2 TABLET(25 MG) BY MOUTH DAILY 03/02/23   Lorre Munroe, NP  nicotine polacrilex (NICORETTE) 4 MG gum Take 4 mg by mouth as needed for smoking cessation.    [provider]  OXYGEN Inhale 2 L into the lungs daily as needed (with activity). Wears nightly    [provider]  phenytoin (DILANTIN) 100 MG ER capsule  TAKE 3 CAPSULES EVERY DAY 02/28/23   Lorre Munroe, NP  tamsulosin (FLOMAX) 0.4 MG CAPS capsule TAKE 1 CAPSULE(0.4 MG) BY MOUTH DAILY 02/25/23   Lorre Munroe, NP    Physical Exam    Vital Signs:  Elijah Mcfarland does not have vital signs available for review today.  Given telephonic nature of communication, physical exam is limited. AAOx3. NAD. Normal affect.  Speech and respirations are unlabored.  Accessory Clinical Findings    None  Assessment & Plan    1.  Preoperative Cardiovascular Risk Assessment:   Mr. Kubisiak perioperative risk of a major cardiac event is 0.4% according to the Revised Cardiac Risk Index (RCRI).  Therefore, he is at low risk for perioperative complications.   His functional capacity is poor at 3.97 METs according to the Duke Activity Status Index (DASI). Recommendations: Echocardiogram from December 2023 reviewed.  It seems most of the patient's SOB is coming from his COPD.  Would recommend pulmonary clearance.  A copy of this note will be routed to requesting  surgeon.  Time:   Today, I have spent 7 minutes with the patient with telehealth technology discussing medical history, symptoms, and management plan.     Sharlene Dory, PA-C  03/30/2023, 12:36 PM

## 2023-04-01 NOTE — Telephone Encounter (Signed)
Per Cardiac Preop visit dated 03/30/23:    1.  Preoperative Cardiovascular Risk Assessment:    Mr. Karger perioperative risk of a major cardiac event is 0.4% according to the Revised Cardiac Risk Index (RCRI).  Therefore, he is at low risk for perioperative complications.   His functional capacity is poor at 3.97 METs according to the Duke Activity Status Index (DASI). Recommendations: Echocardiogram from December 2023 reviewed.  It seems most of the patient's SOB is coming from his COPD.  Would recommend pulmonary clearance.  Pt has been contacted to let him know that I will be sending a pulmonary clearance to his pulmonologist Dr. Marcos Eke.  Dr Georgann Housekeeper office was notified that Mr. Betancur needs pulmonary clearance.  Unfortunately she will not have any appts between now and his colonoscopy date 04/14/23.  He does have a follow up appt scheduled for 04/21/23.  I asked front office to note his chart to let Dr. Jayme Cloud know he needs pulmonary clearance to have his colonoscopy.  Colonoscopy canceled for 02/27 until after clearance can be granted from pulmonary.  Thanks, [place], [place]

## 2023-04-11 ENCOUNTER — Ambulatory Visit (INDEPENDENT_AMBULATORY_CARE_PROVIDER_SITE_OTHER): Payer: Medicare HMO | Admitting: Internal Medicine

## 2023-04-11 ENCOUNTER — Encounter: Payer: Self-pay | Admitting: Internal Medicine

## 2023-04-11 ENCOUNTER — Telehealth: Payer: Self-pay | Admitting: Acute Care

## 2023-04-11 VITALS — BP 124/78 | HR 89 | Ht 68.0 in | Wt 115.0 lb

## 2023-04-11 DIAGNOSIS — F411 Generalized anxiety disorder: Secondary | ICD-10-CM

## 2023-04-11 DIAGNOSIS — J0100 Acute maxillary sinusitis, unspecified: Secondary | ICD-10-CM

## 2023-04-11 DIAGNOSIS — Z0001 Encounter for general adult medical examination with abnormal findings: Secondary | ICD-10-CM

## 2023-04-11 DIAGNOSIS — E440 Moderate protein-calorie malnutrition: Secondary | ICD-10-CM

## 2023-04-11 DIAGNOSIS — R7303 Prediabetes: Secondary | ICD-10-CM | POA: Diagnosis not present

## 2023-04-11 DIAGNOSIS — J441 Chronic obstructive pulmonary disease with (acute) exacerbation: Secondary | ICD-10-CM | POA: Diagnosis not present

## 2023-04-11 DIAGNOSIS — E782 Mixed hyperlipidemia: Secondary | ICD-10-CM

## 2023-04-11 DIAGNOSIS — Z125 Encounter for screening for malignant neoplasm of prostate: Secondary | ICD-10-CM

## 2023-04-11 MED ORDER — DOXYCYCLINE HYCLATE 100 MG PO TABS: 100.0000 mg | ORAL_TABLET | Freq: Two times a day (BID) | ORAL | 0 refills | Status: DC

## 2023-04-11 MED ORDER — ALPRAZOLAM 0.5 MG PO TABS: 0.5000 mg | ORAL_TABLET | Freq: Four times a day (QID) | ORAL | 0 refills | Status: DC | PRN

## 2023-04-11 MED ORDER — PREDNISONE 10 MG PO TABS: ORAL_TABLET | ORAL | 0 refills | Status: DC

## 2023-04-11 NOTE — Assessment & Plan Note (Signed)
 Encourage high protein, high calorie diet

## 2023-04-11 NOTE — Telephone Encounter (Signed)
 Call report from Diane  IMPRESSION: 1. Lung-RADS 4B, suspicious. Additional imaging evaluation or consultation with Pulmonology or Thoracic Surgery recommended. Numerous new patchy indistinct nodular foci of consolidation throughout the periphery of both lower lobes, largest 23.2 mm in the peripheral right lower lobe, more likely infectious or inflammatory. Follow up low-dose chest CT without contrast in 3 months (please use the following order, "CT CHEST LCS NODULE FOLLOW-UP W/O CM") is recommended. 2. Two-vessel coronary atherosclerosis. 3. Aortic Atherosclerosis (ICD10-I70.0) and Emphysema (ICD10-J43.9).

## 2023-04-11 NOTE — Telephone Encounter (Signed)
 Tiffany calling with call report. For CT scan.Tiffany phone number is [phone].

## 2023-04-11 NOTE — Assessment & Plan Note (Signed)
 Deteriorated Increase Xanax to 0.5 mg every 6 hours as needed

## 2023-04-11 NOTE — Progress Notes (Signed)
 Subjective:    Patient ID: Elijah Mcfarland, male    DOB: 09-18-1950, 73 y.o.   MRN: [id number]  HPI  Patient presents to clinic today for his annual exam.  Flu: 12/2022 Tetanus: 08/2008 COVID: X 3 Pneumovax: 02/2018 Prevnar: 01/2020 Shingrix: Never PSA screening: 02/2022 Colon screening: 02/2020 Vision screening: as needed Dentist: as needed, dentures  Diet: He does eat meat. He consumes fruits and veggies. He does eat some fried foods. He drinks mostly coffee, boost Exercise: Walking  Review of Systems  Past Medical History:  Diagnosis Date   Chronic pain syndrome    Left knee   COPD (chronic obstructive pulmonary disease) (HCC)    DDD (degenerative disc disease), lumbar 11/2008   L5-S1 on plain film   Emphysema lung (HCC)    GAD (generalized anxiety disorder)    GERD (gastroesophageal reflux disease)    History of multiple pulmonary nodules 07/2010   Follow up CT scans showed resolution by 03/2011--NO MALIGNANCY.   History of pneumonia 06/2010   HTN (hypertension) 01/07/2014   Osteoarthritis of left knee    + past injury of this knee--tibia fracture?  +left leg shorter than right   Seizure (HCC)    Seizure disorder (HCC)    3 total seizures (first was 1995); neuro eval done and recommended lifetime phenytoin (he had a seizure after coming off the med in the late 90s   Tobacco dependence     Current Outpatient Medications  Medication Sig Dispense Refill   acetaminophen (TYLENOL) 325 MG tablet Take 2 tablets (650 mg total) by mouth every 6 (six) hours as needed for mild pain or headache (or Fever >/= 101). 30 tablet 2   albuterol (PROVENTIL) (2.5 MG/3ML) 0.083% nebulizer solution Take 3 mLs (2.5 mg total) by nebulization every 6 (six) hours as needed for wheezing or shortness of breath. 75 mL 12   albuterol (VENTOLIN HFA) 108 (90 Base) MCG/ACT inhaler INHALE 2 PUFFS INTO THE LUNGS EVERY 6 HOURS AS NEEDED FOR WHEEZING OR SHORTNESS OF BREATH 6.7 g 5   ALPRAZolam (XANAX) 0.5  MG tablet TAKE 1 TABLET(0.5 MG) BY MOUTH THREE TIMES DAILY 90 tablet 0   aspirin EC 81 MG tablet Take 1 tablet (81 mg total) by mouth daily. Swallow whole. 90 tablet 3   atorvastatin (LIPITOR) 40 MG tablet Take 1 tablet (40 mg total) by mouth daily. 90 tablet 3   Budeson-Glycopyrrol-Formoterol (BREZTRI AEROSPHERE) 160-9-4.8 MCG/ACT AERO Inhale 2 puffs into the lungs in the morning and at bedtime. 32.1 g 3   cetirizine (ZYRTEC) 10 MG tablet Take 10 mg by mouth as needed for allergies.     citalopram (CELEXA) 40 MG tablet TAKE 1 TABLET EVERY DAY 90 tablet 1   Ferrous Sulfate (IRON) 325 (65 Fe) MG TABS Take 1 tablet by mouth in the morning and at bedtime.     furosemide (LASIX) 20 MG tablet Take 1 tablet (20 mg total) by mouth daily.     guaiFENesin (MUCINEX) 600 MG 12 hr tablet Take 1,200 mg by mouth 2 (two) times daily.     losartan (COZAAR) 50 MG tablet TAKE 1/2 TABLET(25 MG) BY MOUTH DAILY 45 tablet 0   nicotine polacrilex (NICORETTE) 4 MG gum Take 4 mg by mouth as needed for smoking cessation.     OXYGEN Inhale 2 L into the lungs daily as needed (with activity). Wears nightly     phenytoin (DILANTIN) 100 MG ER capsule TAKE 3 CAPSULES EVERY DAY 270 capsule 3  tamsulosin (FLOMAX) 0.4 MG CAPS capsule TAKE 1 CAPSULE(0.4 MG) BY MOUTH DAILY 90 capsule 0   No current facility-administered medications for this visit.    Allergies  Allergen Reactions   Nsaids Other (See Comments)    GI bleed and vomiting    Family History  Problem Relation Age of Onset   Heart disease Mother    Heart disease Father    Cancer Sister    Cirrhosis Sister    Cirrhosis Brother    Liver cancer Cousin        Materinal Side   Stroke Neg Hx     Social History   Socioeconomic History   Marital status: Married    Spouse name: Not on file   Number of children: Not on file   Years of education: Not on file   Highest education level: Not on file  Occupational History   Not on file  Tobacco Use   Smoking  status: Every Day    Current packs/day: 0.50    Average packs/day: 2.9 packs/day for 54.1 years (159.6 ttl pk-yrs)    Types: Cigarettes    Start date: 26   Smokeless tobacco: Never   Tobacco comments:    10 cigarettes daily--08/27/2022 hfb  Vaping Use   Vaping status: Never Used  Substance and Sexual Activity   Alcohol use: No    Alcohol/week: 0.0 standard drinks of alcohol   Drug use: Not Currently    Types: Marijuana    Comment: 2 month   Sexual activity: Yes    Partners: Male  Other Topics Concern   Not on file  Social History Narrative   Married, 2 grown children   Eighth grade education.  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   Originally from [place].   Tobacco 100 pack-yr hx, still smoking as of 10/2011 (1 pack per day).   Denies alcohol or drug use.   No exercise.   Social Drivers of Corporate investment banker Strain: Low Risk  (03/11/2023)   Overall Financial Resource Strain (CARDIA)    Difficulty of Paying Living Expenses: Not hard at all  Food Insecurity: No Food Insecurity (03/11/2023)   Hunger Vital Sign    Worried About Running Out of Food in the Last Year: Never true    Ran Out of Food in the Last Year: Never true  Transportation Needs: No Transportation Needs (03/11/2023)   PRAPARE - Administrator, Civil Service (Medical): No    Lack of Transportation (Non-Medical): No  Physical Activity: Sufficiently Active (03/11/2023)   Exercise Vital Sign    Days of Exercise per Week: 5 days    Minutes of Exercise per Session: 30 min  Stress: No Stress Concern Present (03/11/2023)   [employer] of Occupational Health - Occupational Stress Questionnaire    Feeling of Stress : Not at all  Social Connections: Moderately Isolated (03/11/2023)   Social Connection and Isolation Panel [NHANES]    Frequency of Communication with Friends and Family: More than three times a week    Frequency of Social Gatherings with Friends and  Family: More than three times a week    Attends Religious Services: Never    Database administrator or Organizations: No    Attends Banker Meetings: Never    Marital Status: Married  Catering manager Violence: Not At Risk (03/11/2023)   Humiliation, Afraid, Rape, and Kick questionnaire    Fear of Current or Ex-Partner: No  Emotionally Abused: No    Physically Abused: No    Sexually Abused: No     Constitutional: Denies fever, malaise, fatigue, headache or abrupt weight changes.  HEENT: Patient reports difficulty with vision, runny nose, nasal congestion and sore throat.  Denies eye pain, eye redness, ear pain, ringing in the ears, wax buildup, bloody nose. Respiratory: Pt reports cough and shortness of breath. Denies difficulty breathing.   Cardiovascular: Denies chest pain, chest tightness, palpitations or swelling in the hands or feet.  Gastrointestinal: Denies abdominal pain, bloating, constipation, diarrhea or blood in the stool.  GU: Pt reports nocturia. Denies urgency, frequency, pain with urination, burning sensation, blood in urine, odor or discharge. Musculoskeletal: Patient reports joint pain.  Denies decrease in range of motion, difficulty with gait, muscle pain or joint swelling.  Skin: Denies redness, rashes, lesions or ulcercations.  Neurological: Denies dizziness, difficulty with memory, difficulty with speech or problems with balance and coordination.  Psych: Patient has a history of anxiety.  Denies depression, SI/HI.  No other specific complaints in a complete review of systems (except as listed in HPI above).     Objective:   Physical Exam BP 124/78 (BP Location: Left Arm, Patient Position: Sitting, Cuff Size: Normal)   Pulse 89   Ht 5\' 8"  (1.727 m)   Wt 115 lb (52.2 kg)   SpO2 95%   BMI 17.49 kg/m    Wt Readings from Last 3 Encounters:  01/03/23 113 lb (51.3 kg)  12/21/22 117 lb (53.1 kg)  09/20/22 117 lb (53.1 kg)    General: Appears  his stated age, underweight, in NAD. Skin: Warm, dry and intact.  Pale. HEENT: Head: normal shape and size, maxillary sinus tenderness noted; Eyes: sclera white, no icterus, conjunctiva pink, PERRLA and EOMs intact;  Neck:  Neck supple, trachea midline. No masses, lumps or thyromegaly present.  Cardiovascular: With normal rhythm. S1,S2 noted.  No murmur, rubs or gallops noted. No JVD or BLE edema. No carotid bruits noted. Pulmonary/Chest: Normal effort and coarse breath sounds with scattered rhonchi and bilateral expiratory wheezing. No respiratory distress.  Abdomen: Soft and nontender. Normal bowel sounds.  Musculoskeletal: Muscle wasting noted. Strength 5/5 BUE/BLE.  Shuffling gait without assistive device. Neurological: Alert and oriented. Cranial nerves II-XII grossly intact. Coordination normal.  Psychiatric: Mood and affect normal. Behavior is normal. Judgment and thought content normal.     BMET    Component Value Date/Time   NA 139 05/05/2022 1512   K 3.8 05/05/2022 1512   CL 103 05/05/2022 1512   CO2 27 05/05/2022 1512   GLUCOSE 98 05/05/2022 1512   BUN 9 05/05/2022 1512   CREATININE 0.71 05/05/2022 1512   CREATININE 0.67 (L) 03/15/2022 1445   CALCIUM 9.0 05/05/2022 1512   GFRNONAA >60 05/05/2022 1512   GFRAA >60 03/10/2018 0506    Lipid Panel     Component Value Date/Time   CHOL 164 08/27/2022 1518   TRIG 129 08/27/2022 1518   HDL 55 08/27/2022 1518   CHOLHDL 3.0 08/27/2022 1518   VLDL 26 08/27/2022 1518   LDLCALC 83 08/27/2022 1518   LDLCALC 97 03/15/2022 1445    CBC    Component Value Date/Time   WBC 10.1 07/06/2022 1629   RBC 4.19 (L) 07/06/2022 1629   HGB 13.1 07/06/2022 1629   HCT 39.1 07/06/2022 1629   PLT 190 07/06/2022 1629   MCV 93.3 07/06/2022 1629   MCH 31.3 07/06/2022 1629   MCHC 33.5 07/06/2022 1629   RDW  13.9 07/06/2022 1629   LYMPHSABS 1.9 07/06/2022 1629   MONOABS 0.8 07/06/2022 1629   EOSABS 1.2 (H) 07/06/2022 1629   BASOSABS 0.1  07/06/2022 1629    Hgb A1C Lab Results  Component Value Date   HGBA1C 5.1 09/14/2021           Assessment & Plan:   Preventative Health Maintenance:  Flu shot UTD He declines tetanus for financial reasons, advised him if he gets better To go get this done Pneumovax and Prevnar UTD Encouraged him to get his COVID booster Discussed Shingrix vaccine, he will check coverage with his insurance company Colon screening due however he declines at this time Encouraged him to consume a balanced diet and exercise regimen Advised him to see an eye doctor and dentist annually We will check CBC, c-Met, lipid, PSA, A1c level today  Acute maxillary sinusitis, COPD exacerbation:  Can use a neti pot which can be purchased from your local pharmacy Encouraged rest and fluids Rx for doxycycline 100 mg twice daily x 10 days Rx for Pred taper x 6 days  RTC in 6 months, follow-up chronic conditions Nicki Reaper, NP

## 2023-04-11 NOTE — Patient Instructions (Signed)
 Health Maintenance After Age 71 After age 4, you are at a higher risk for certain long-term diseases and infections as well as injuries from falls. Falls are a major cause of broken bones and head injuries in people who are older than age 47. Getting regular preventive care can help to keep you healthy and well. Preventive care includes getting regular testing and making lifestyle changes as recommended by your health care provider. Talk with your health care provider about: Which screenings and tests you should have. A screening is a test that checks for a disease when you have no symptoms. A diet and exercise plan that is right for you. What should I know about screenings and tests to prevent falls? Screening and testing are the best ways to find a health problem early. Early diagnosis and treatment give you the best chance of managing medical conditions that are common after age 37. Certain conditions and lifestyle choices may make you more likely to have a fall. Your health care provider may recommend: Regular vision checks. Poor vision and conditions such as cataracts can make you more likely to have a fall. If you wear glasses, make sure to get your prescription updated if your vision changes. Medicine review. Work with your health care provider to regularly review all of the medicines you are taking, including over-the-counter medicines. Ask your health care provider about any side effects that may make you more likely to have a fall. Tell your health care provider if any medicines that you take make you feel dizzy or sleepy. Strength and balance checks. Your health care provider may recommend certain tests to check your strength and balance while standing, walking, or changing positions. Foot health exam. Foot pain and numbness, as well as not wearing proper footwear, can make you more likely to have a fall. Screenings, including: Osteoporosis screening. Osteoporosis is a condition that causes  the bones to get weaker and break more easily. Blood pressure screening. Blood pressure changes and medicines to control blood pressure can make you feel dizzy. Depression screening. You may be more likely to have a fall if you have a fear of falling, feel depressed, or feel unable to do activities that you used to do. Alcohol use screening. Using too much alcohol can affect your balance and may make you more likely to have a fall. Follow these instructions at home: Lifestyle Do not drink alcohol if: Your health care provider tells you not to drink. If you drink alcohol: Limit how much you have to: 0-1 drink a day for women. 0-2 drinks a day for men. Know how much alcohol is in your drink. In the [place] one drink equals one 12 oz bottle of beer (355 mL), one 5 oz glass of wine (148 mL), or one 1 oz glass of hard liquor (44 mL). Do not use any products that contain nicotine or tobacco. These products include cigarettes, chewing tobacco, and vaping devices, such as e-cigarettes. If you need help quitting, ask your health care provider. Activity  Follow a regular exercise program to stay fit. This will help you maintain your balance. Ask your health care provider what types of exercise are appropriate for you. If you need a cane or walker, use it as recommended by your health care provider. Wear supportive shoes that have nonskid soles. Safety  Remove any tripping hazards, such as rugs, cords, and clutter. Install safety equipment such as grab bars in bathrooms and safety rails on stairs. Keep rooms and walkways  well-lit. General instructions Talk with your health care provider about your risks for falling. Tell your health care provider if: You fall. Be sure to tell your health care provider about all falls, even ones that seem minor. You feel dizzy, tiredness (fatigue), or off-balance. Take over-the-counter and prescription medicines only as told by your health care provider. These include  supplements. Eat a healthy diet and maintain a healthy weight. A healthy diet includes low-fat dairy products, low-fat (lean) meats, and fiber from whole grains, beans, and lots of fruits and vegetables. Stay current with your vaccines. Schedule regular health, dental, and eye exams. Summary Having a healthy lifestyle and getting preventive care can help to protect your health and wellness after age 11. Screening and testing are the best way to find a health problem early and help you avoid having a fall. Early diagnosis and treatment give you the best chance for managing medical conditions that are more common for people who are older than age 28. Falls are a major cause of broken bones and head injuries in people who are older than age 48. Take precautions to prevent a fall at home. Work with your health care provider to learn what changes you can make to improve your health and wellness and to prevent falls. This information is not intended to replace advice given to you by your health care provider. Make sure you discuss any questions you have with your health care provider. Document Revised: 06/23/2020 Document Reviewed: 06/23/2020 Elsevier Patient Education  2024 [employer].

## 2023-04-12 LAB — CBC
HCT: 35.8 % — ABNORMAL LOW (ref 38.5–50.0)
Hemoglobin: 12.1 g/dL — ABNORMAL LOW (ref 13.2–17.1)
MCH: 31.4 pg (ref 27.0–33.0)
MCHC: 33.8 g/dL (ref 32.0–36.0)
MCV: 93 fL (ref 80.0–100.0)
MPV: 10.1 fL (ref 7.5–12.5)
Platelets: 190 10*3/uL (ref 140–400)
RBC: 3.85 10*6/uL — ABNORMAL LOW (ref 4.20–5.80)
RDW: 13 % (ref 11.0–15.0)
WBC: 9.2 10*3/uL (ref 3.8–10.8)

## 2023-04-12 LAB — COMPLETE METABOLIC PANEL WITH GFR
AG Ratio: 1.5 (calc) (ref 1.0–2.5)
ALT: 9 U/L (ref 9–46)
AST: 13 U/L (ref 10–35)
Albumin: 3.8 g/dL (ref 3.6–5.1)
Alkaline phosphatase (APISO): 78 U/L (ref 35–144)
BUN/Creatinine Ratio: 13 (calc) (ref 6–22)
BUN: 8 mg/dL (ref 7–25)
CO2: 30 mmol/L (ref 20–32)
Calcium: 8.6 mg/dL (ref 8.6–10.3)
Chloride: 98 mmol/L (ref 98–110)
Creat: 0.64 mg/dL — ABNORMAL LOW (ref 0.70–1.28)
Globulin: 2.5 g/dL (ref 1.9–3.7)
Glucose, Bld: 85 mg/dL (ref 65–99)
Potassium: 4.1 mmol/L (ref 3.5–5.3)
Sodium: 137 mmol/L (ref 135–146)
Total Bilirubin: 0.3 mg/dL (ref 0.2–1.2)
Total Protein: 6.3 g/dL (ref 6.1–8.1)
eGFR: 101 mL/min/{1.73_m2} (ref >=60)

## 2023-04-12 LAB — LIPID PANEL
Cholesterol: 144 mg/dL (ref <200)
HDL: 63 mg/dL (ref >=40)
LDL Cholesterol (Calc): 63 mg/dL
Non-HDL Cholesterol (Calc): 81 mg/dL (ref <130)
Total CHOL/HDL Ratio: 2.3 (calc) (ref <5.0)
Triglycerides: 96 mg/dL (ref <150)

## 2023-04-12 LAB — PSA: PSA: 0.96 ng/mL (ref <=4.00)

## 2023-04-12 LAB — HEMOGLOBIN A1C
Hgb A1c MFr Bld: 5.4 %{Hb} (ref <5.7)
Mean Plasma Glucose: 108 mg/dL
eAG (mmol/L): 6 mmol/L

## 2023-04-12 NOTE — Telephone Encounter (Signed)
 FYI: This patient sees Dr Jayme Cloud and has an appt to see her on 04/21/23

## 2023-04-13 ENCOUNTER — Telehealth: Payer: Self-pay | Admitting: Acute Care

## 2023-04-13 NOTE — Telephone Encounter (Signed)
 I have attempted to call the patient with the results of their  Low Dose CT Chest Lung cancer screening scan. There was no answer. I have left a HIPPA compliant VM requesting the patient call the office for the scan results. I included the office contact information in the message. We will await his return call. If no return call we will continue to call until patient is contacted.    We need to ask patient if he has been sick. His scan is concerning for potentially infectious , inflammatory changes. If he has not been sick, we will need to consider a short term follow up scan . He has an appointment with Dr. Jayme Cloud who has been following him on 3/6. She can discuss the results with him then. Thanks so much

## 2023-04-14 ENCOUNTER — Ambulatory Visit: Admit: 2023-04-14 | Payer: Medicare HMO | Admitting: Gastroenterology

## 2023-04-14 ENCOUNTER — Encounter: Payer: Medicare HMO | Admitting: Internal Medicine

## 2023-04-14 SURGERY — COLONOSCOPY WITH PROPOFOL
Anesthesia: General

## 2023-04-14 NOTE — Telephone Encounter (Signed)
 See provider notes 04/13/2023

## 2023-04-15 ENCOUNTER — Other Ambulatory Visit: Payer: Self-pay

## 2023-04-15 DIAGNOSIS — F17210 Nicotine dependence, cigarettes, uncomplicated: Secondary | ICD-10-CM

## 2023-04-15 DIAGNOSIS — R911 Solitary pulmonary nodule: Secondary | ICD-10-CM

## 2023-04-15 DIAGNOSIS — Z122 Encounter for screening for malignant neoplasm of respiratory organs: Secondary | ICD-10-CM

## 2023-04-15 DIAGNOSIS — Z87891 Personal history of nicotine dependence: Secondary | ICD-10-CM

## 2023-04-15 NOTE — Telephone Encounter (Signed)
 I reviewed his scan. He has f/u with me next week. Lets go ahead and plan for repeat scan in 3 months. His findings are consistent with infection.

## 2023-04-18 ENCOUNTER — Other Ambulatory Visit (INDEPENDENT_AMBULATORY_CARE_PROVIDER_SITE_OTHER): Payer: Medicare HMO | Admitting: Pharmacist

## 2023-04-18 DIAGNOSIS — J449 Chronic obstructive pulmonary disease, unspecified: Secondary | ICD-10-CM

## 2023-04-18 NOTE — Progress Notes (Unsigned)
   04/18/2023 Name: Elijah Mcfarland MRN: [id number] DOB: [birth year]  Chief Complaint  Patient presents with   Medication Assistance    Montrell Cessna is a 73 y.o. year old male who presented for a telephone visit.   They were referred to the pharmacist by their PCP for assistance in managing medication access.      Subjective:   Care Team: Primary Care Provider: Lorre Munroe, NP; Next Scheduled Visit: 04/11/2023 Pulmonologist: Salena Saner, MD; Next Scheduled Visit: 03/24/2023 Cardiologist: [facility]  Medication Access/Adherence  Current Pharmacy:  [facility] [phone] - [place], [place] - [address] [place] [address] Phone: [phone] Fax: [phone]  [facility] - [place], [place] - [address] [place] [address] Phone: [phone] Fax: [phone]  [facility] [place], [place] - [address] [place] [address] Phone: [phone] Fax: [phone]   Patient reports affordability concerns with their medications: No  Patient reports access/transportation concerns to their pharmacy: No  Patient reports adherence concerns with their medications:  No       COPD: Patient followed by [facility]   Current treatment: Breztri - 2 puffs twice daily Albuterol as needed   Confirms using Breztri inhaler twice daily (morning and evening) and rinsing mouth out after use   Current medication access: Collaborating with Pulmonologist and CPhT to support patient with re-enrollment in patient assistance program for [place] for 2025 calendar year - Reports currently has 1 month supply of Breztri remaining - From review of chart, note CPhT faxed application to [employer] on 03/18/2023   Objective:  Lab Results  Component Value Date   HGBA1C 5.4 04/11/2023    Lab Results  Component  Value Date   CREATININE 0.64 (L) 04/11/2023   BUN 8 04/11/2023   NA 137 04/11/2023   K 4.1 04/11/2023   CL 98 04/11/2023   CO2 30 04/11/2023    Lab Results  Component Value Date   CHOL 144 04/11/2023   HDL 63 04/11/2023   LDLCALC 63 04/11/2023   TRIG 96 04/11/2023   CHOLHDL 2.3 04/11/2023    Medications Reviewed Today   Medications were not reviewed in this encounter       Assessment/Plan:   COPD: - Patient to follow up with [employer] program as needed for Breztri refills - Collaborating with Pulmonologist and CPhT to support patient with re-enrollment in patient assistance program for [place] for 2025 calendar year - Discuss Breztri inhaler technique. Ask patient's spouse to support with monitoring day supply remaining based on dose counter on top of inhaler.  Follow Up Plan: Clinical Pharmacist will follow up with patient by telephone on ***    Estelle Grumbles, PharmD, [facility] [phone]

## 2023-04-19 NOTE — Patient Instructions (Addendum)
Goals Addressed             This Visit's Progress    Pharmacy Goals       Please use your Breztri 2 puffs twice a day.  Make sure you rinse your mouth well after you use it.  You can contact [employer] at [phone] to follow up about refills of Breztri  Please consider calling the [place] Quitline for their help with quitting smoking.  The [place] Quitline phone number is: [phone]  Feel free to call me with any questions or concerns. I look forward to our next call!  Estelle Grumbles, PharmD, [facility] Clinical Pharmacist [facility] [phone]

## 2023-04-21 ENCOUNTER — Ambulatory Visit: Payer: Medicare HMO | Admitting: Pulmonary Disease

## 2023-04-21 ENCOUNTER — Encounter: Payer: Self-pay | Admitting: Pulmonary Disease

## 2023-04-21 VITALS — BP 112/70 | HR 71 | Temp 97.1°F | Ht 68.0 in | Wt 121.4 lb

## 2023-04-21 DIAGNOSIS — J449 Chronic obstructive pulmonary disease, unspecified: Secondary | ICD-10-CM | POA: Diagnosis not present

## 2023-04-21 DIAGNOSIS — R9389 Abnormal findings on diagnostic imaging of other specified body structures: Secondary | ICD-10-CM | POA: Diagnosis not present

## 2023-04-21 DIAGNOSIS — R64 Cachexia: Secondary | ICD-10-CM

## 2023-04-21 DIAGNOSIS — F17210 Nicotine dependence, cigarettes, uncomplicated: Secondary | ICD-10-CM

## 2023-04-21 DIAGNOSIS — Z01811 Encounter for preprocedural respiratory examination: Secondary | ICD-10-CM

## 2023-04-21 DIAGNOSIS — J9611 Chronic respiratory failure with hypoxia: Secondary | ICD-10-CM | POA: Diagnosis not present

## 2023-04-21 MED ORDER — GUAIFENESIN-CODEINE 100-10 MG/5ML PO SOLN: 5.0000 mL | Freq: Four times a day (QID) | ORAL | 0 refills | Status: DC | PRN

## 2023-04-21 MED ORDER — AZITHROMYCIN 500 MG PO TABS: 500.0000 mg | ORAL_TABLET | Freq: Every day | ORAL | 0 refills | Status: AC

## 2023-04-21 NOTE — Progress Notes (Signed)
 Subjective:    Patient ID: Elijah Mcfarland, male    DOB: [birth year], 73 y.o.   MRN: [id number]  Patient Care Team: Lorre Munroe, NP as PCP - General (Internal Medicine) Debbe Odea, MD as PCP - Cardiology (Cardiology) Ronney Asters, Jackelyn Poling, RPH-CPP as Pharmacist Salena Saner, MD as Consulting Physician (Pulmonary Disease) [facility], [facility])  Chief Complaint  Patient presents with   Follow-up    No SOB today. Wheezing. Cough with white sputum.    BACKGROUND/INTERVAL:73 year male, current smoker.  Past medical history significant for severe COPD, chronic respiratory failure, hypertension, aortic arthrosclerosis, GERD, arthritis, hyperlipidemia, macular degeneration, prediabetes, tobacco abuse and pulmonary cachexia.  Presents for follow-up and for preoperative pulmonary assessment.  This is a scheduled visit.  Last seen on 21 December 2022 by me. No recent exacerbations.  No admissions to the hospital, abnormal CT for lung cancer screening on 25 March 2023.  HPI Discussed the use of AI scribe software for clinical note transcription with the patient, who gave verbal consent to proceed.  History of Present Illness   Elijah Mcfarland is a 73 year old male with severe COPD and chronic respiratory failure who presents for follow-up.  He underwent a recent scan which revealed emphysema and some scarring in the lungs, with findings suggestive of pneumonia and and nodular infiltrates noted in both lungs particularly at the bases.  These may be secondary to infection/inflammation  He has variable smoking habits, stating 'it depends' and mentions having smoked three cigarettes today. He tends to avoid smoking when feeling unwell, although he does not feel sick today.  No nausea, vomiting, or reflux. He reports frequent episodes of feeling 'strangled' when swallowing, ongoing for about a month and a half, sometimes leading to breathlessness. He inquires about the safety  of using syrup with codeine, expressing concern about its potential impact on his lung status.  He has had increased cough in the last few days and states that codeine usually helps him but he does not want to overdo it.  He coughs up mucus, particularly at night and overnight. He does not require any medication refills at this time.   We have been asked to render opinion with regards to patient's tolerability of general anesthesia for the purpose of colonoscopy.  Full assessment is as below.    Review of Systems A 10 point review of systems was performed and it is as noted above otherwise negative.   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  Substance Use Topics   Alcohol use: No    Alcohol/week: 0.0 standard drinks of alcohol    Allergies  Allergen Reactions   Nsaids Other (See Comments)    GI bleed and vomiting    Current Meds  Medication Sig   acetaminophen (TYLENOL) 325 MG tablet Take 2 tablets (650 mg total) by mouth every  6 (six) hours as needed for mild pain or headache (or Fever >/= 101).   albuterol (PROVENTIL) (2.5 MG/3ML) 0.083% nebulizer solution Take 3 mLs (2.5 mg total) by nebulization every 6 (six) hours as needed for wheezing or shortness of breath.   albuterol (VENTOLIN HFA) 108 (90 Base) MCG/ACT inhaler INHALE 2 PUFFS INTO THE LUNGS EVERY 6 HOURS AS NEEDED FOR WHEEZING OR SHORTNESS OF BREATH   ALPRAZolam (XANAX) 0.5 MG tablet Take 1 tablet (0.5 mg total) by mouth every 6 (six) hours as needed for anxiety. TAKE 1 TABLET(0.5 MG) BY MOUTH THREE TIMES DAILY   aspirin EC 81 MG tablet Take 1 tablet (81 mg total) by mouth daily. Swallow whole.   atorvastatin (LIPITOR) 40 MG tablet Take 1 tablet (40 mg total) by mouth daily.   Budeson-Glycopyrrol-Formoterol (BREZTRI AEROSPHERE) 160-9-4.8 MCG/ACT AERO Inhale 2 puffs into the lungs in the morning and at bedtime.   cetirizine (ZYRTEC) 10 MG tablet Take 10 mg by mouth as needed for allergies.   citalopram (CELEXA) 40 MG tablet TAKE 1 TABLET EVERY DAY   doxycycline (VIBRA-TABS) 100 MG tablet Take 1 tablet (100 mg total) by mouth 2 (two) times daily.   Ferrous Sulfate (IRON) 325 (65 Fe) MG TABS Take 1 tablet by mouth in the morning and at bedtime.   guaiFENesin (MUCINEX) 600 MG 12 hr tablet Take 1,200 mg by mouth 2 (two) times daily.   losartan (COZAAR) 50 MG tablet TAKE 1/2 TABLET(25 MG) BY MOUTH DAILY   nicotine polacrilex (NICORETTE) 4 MG gum Take 4 mg by mouth as needed for smoking cessation.   OXYGEN Inhale 2 L into the lungs daily as needed (with activity). Wears nightly   phenytoin (DILANTIN) 100 MG ER capsule TAKE 3 CAPSULES EVERY DAY   tamsulosin (FLOMAX) 0.4 MG CAPS capsule TAKE 1 CAPSULE(0.4 MG) BY MOUTH DAILY    Immunization History  Administered Date(s) Administered   Fluad Quad(high Dose 65+) 11/17/2018, 02/05/2020, 03/10/2021, 03/15/2022   Fluad Trivalent(High Dose 65+) 12/21/2022   H1N1 01/05/2008   Influenza Whole 11/07/2007   Influenza, High  Dose Seasonal PF 03/03/2018   Influenza, Seasonal, Injecte, Preservative Fre 04/13/2013   Influenza,inj,Quad PF,6+ Mos 11/14/2014, 02/19/2016, 02/18/2017   Moderna SARS-COV2 Booster Vaccination 12/26/2019   Moderna Sars-Covid-2 Vaccination 04/22/2019, 05/20/2019   Pneumococcal Conjugate-13 11/14/2014, 02/05/2020   Pneumococcal Polysaccharide-23 11/07/2007, 02/19/2016, 03/03/2018   Td 09/06/2008        Objective:     BP 112/70 (BP Location: Right Arm, Cuff Size: Normal)   Pulse 71   Temp (!) 97.1 F (36.2 C)   Ht 5\' 8"  (1.727 m)   Wt 121 lb 6.4 oz (55.1 kg)   SpO2 97%   BMI 18.46 kg/m   SpO2: 97 % O2 Device: Nasal cannula O2 Flow Rate (L/min): 2 L/min O2 Type: Continuous O2  GENERAL: Thin, well-developed male, no overt respiratory distress, fully ambulatory.  Comfortable with nasal cannula O2 via POC. HEAD: Normocephalic, atraumatic. EYES: Pupils equal, round, reactive to light.  No scleral icterus. MOUTH: Dentures both, oral mucosa moist. NECK: Supple. No thyromegaly. No nodules. No JVD.  Trachea midline PULMONARY: Symmetrical air entry, no accessory muscle use noted today, you scattered wheezes noted, no other adventitious sounds, breath sounds are coarse.  Positive Hoover's sign. CARDIOVASCULAR: S1 and S2. Regular rate and rhythm.  No overt murmurs, gallops or rubs noted. GASTROINTESTINAL: No distention.  Benign. MUSCULOSKELETAL: No joint deformity, no clubbing, no edema. NEUROLOGIC: No focal deficits, fluent speech, awake, alert. SKIN: Intact,warm,dry.  No rashes noted on limited exam. PSYCH: Normal mood and behavior  Representative image of CT chest performed 25 March 2023 showing multiple nodular foci of consolidation throughout the periphery of both lower lobes:    ARISCAT Score for Postoperative Pulmonary Complications: 44 points + additional risk due to underlying pulmonary cachexia.  Assessment & Plan:     ICD-10-CM   1. Stage 3 severe COPD by GOLD  classification (HCC)  J44.9     2. Chronic respiratory failure with hypoxia (HCC)  J96.11     3. 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 Abnormal screening CT of chest  R93.89     5. Tobacco dependence due to cigarettes  F17.210     6. Preoperative respiratory examination  Z01.811      Meds ordered this encounter  Medications   guaiFENesin-codeine 100-10 MG/5ML syrup    Sig: Take 5 mLs by mouth every 6 (six) hours as needed for cough.    Dispense:  120 mL    Refill:  0   azithromycin (ZITHROMAX) 500 MG tablet    Sig: Take 1 tablet (500 mg total) by mouth daily for 3 days. Take 1 tablet daily for 3 days.    Dispense:  3 tablet    Refill:  0   Discussion:    Chronic Obstructive Pulmonary Disease (COPD) Severe COPD with chronic respiratory failure. Reports variable smoking habits, currently minimal smoking. Physical exam reveals emphysema and scarring in both lungs. No current symptoms of nausea, vomiting, or reflux. Experiences frequent episodes of dysphagia for about a month and a half. Lung auscultation is normal today. Discussed risks of codeine due to potential respiratory depression and advised caution in its use. - Prescribe antibiotic to ensure clearance of potential pneumonia seen on CT chest - Advise to limit codeine due to potential respiratory depression - Schedule follow-up appointment in 3-4 weeks  Pneumonia CT scan shows findings suggestive of pneumonia in both lungs. Differential diagnosis includes other conditions that can mimic pneumonia. - Repeat CT scan to monitor lung findings  General Health Maintenance Discussed smoking habits and advised cessation. - Advise smoking cessation  Preoperative pulmonary risk assessment ARISCAT score of 44 placing the patient at intermediate risk however, patient also has concomitant pulmonary cachexia which places the patient at HIGH RISK for postoperative pulmonary complications due to significant muscle  wasting. - Patient is intermediate to HIGH RISK for postoperative pulmonary complications from general anesthesia -This risk cannot be further reduced - Would consider performing procedure under total intravenous anesthesia with monitored anesthesia care  Follow-up - Schedule follow-up appointment in 3-4 weeks.     Advised if symptoms do not improve or worsen, to please contact office for sooner follow up or seek emergency care.    I spent 40 minutes of dedicated to the care of this patient on the date of this encounter to include pre-visit review of records, face-to-face time with the patient discussing conditions above, post visit ordering of testing, clinical documentation with the electronic health record, making appropriate referrals as  documented, and communicating necessary findings to members of the patients care team.     C. Danice Goltz, MD Advanced Bronchoscopy P[facility]    *This note was generated using voice recognition software/Dragon and/or AI transcription program.  Despite best efforts to proofread, errors can occur which can change the meaning. Any transcriptional errors that result from this process are unintentional and may not be fully corrected at the time of dictation.

## 2023-04-21 NOTE — Patient Instructions (Signed)
 VISIT SUMMARY:  Today, we discussed your severe COPD and chronic respiratory failure, as well as the recent scan findings of emphysema, scarring, and possible pneumonia. We also addressed your variable smoking habits and episodes of difficulty swallowing.  YOUR PLAN:  -CHRONIC OBSTRUCTIVE PULMONARY DISEASE (COPD): COPD is a chronic lung condition that makes it hard to breathe. You have severe COPD with chronic respiratory failure, and your recent scan showed emphysema and scarring in your lungs. We discussed the risks of using codeine due to its potential to depress your breathing, and I advised against its use. I have prescribed an antibiotic to help clear any potential pneumonia. Please avoid smoking as much as possible and follow up in 3-4 weeks.  -PNEUMONIA: Pneumonia is an infection that inflames the air sacs in one or both lungs. Your CT scan showed findings suggestive of pneumonia. To monitor this, we will repeat the CT scan to check your lung findings.  -GENERAL HEALTH MAINTENANCE: We discussed your smoking habits and I advised you to stop smoking to improve your lung health. Quitting smoking is one of the best things you can do for your overall health.  INSTRUCTIONS:  Please schedule a follow-up appointment in 3-4 weeks. We will also need to repeat your CT scan to monitor your lung findings.  For more information, you can read your full clinical note, available in your patient portal.

## 2023-04-26 ENCOUNTER — Encounter: Payer: Self-pay | Admitting: Pulmonary Disease

## 2023-04-26 NOTE — Telephone Encounter (Signed)
 Marcelino Duster, I saw Mr. Biel on 21 April 2023 his note is on the chart.  We also sent the form and a copy of the note to your office.  The risk assessment is on the note as well as on the form requested.  Dr. Jayme Cloud

## 2023-05-16 ENCOUNTER — Other Ambulatory Visit: Payer: Self-pay | Admitting: Internal Medicine

## 2023-05-17 ENCOUNTER — Other Ambulatory Visit: Payer: Self-pay | Admitting: Internal Medicine

## 2023-05-17 DIAGNOSIS — Z1211 Encounter for screening for malignant neoplasm of colon: Secondary | ICD-10-CM

## 2023-05-18 NOTE — Telephone Encounter (Signed)
 Requested medication (s) are due for refill today: yes  Requested medication (s) are on the active medication list: yes  Last refill:  04/12/23  Future visit scheduled: yes  Notes to clinic:  Unable to refill per protocol, cannot delegate.'     Requested Prescriptions  Pending Prescriptions Disp Refills   ALPRAZolam (XANAX) 0.5 MG tablet [Pharmacy Med Name: ALPRAZOLAM 0.5MG  TABLETS] 120 tablet     Sig: TAKE 1 TABLET BY MOUTH EVERY 6 HOURS AS NEEDED FOR ANXIETY     Not Delegated - Psychiatry: Anxiolytics/Hypnotics 2 Failed - 05/18/2023  9:55 AM      Failed - This refill cannot be delegated      Failed - Urine Drug Screen completed in last 360 days      Passed - Patient is not pregnant      Passed - Valid encounter within last 6 months    Recent Outpatient Visits           1 month ago Encounter for general adult medical examination with abnormal findings   [facility], Salvadore Oxford, NP

## 2023-05-24 ENCOUNTER — Other Ambulatory Visit: Payer: Self-pay

## 2023-05-24 MED ORDER — ATORVASTATIN CALCIUM 40 MG PO TABS: 40.0000 mg | ORAL_TABLET | Freq: Every day | ORAL | 3 refills | Status: AC

## 2023-05-24 NOTE — Telephone Encounter (Signed)
Refill sent for Atorvastatin 40 mg

## 2023-05-25 ENCOUNTER — Other Ambulatory Visit: Payer: Self-pay | Admitting: Internal Medicine

## 2023-05-25 DIAGNOSIS — I10 Essential (primary) hypertension: Secondary | ICD-10-CM

## 2023-05-25 DIAGNOSIS — F411 Generalized anxiety disorder: Secondary | ICD-10-CM

## 2023-05-26 ENCOUNTER — Encounter: Payer: Self-pay | Admitting: Pulmonary Disease

## 2023-05-26 ENCOUNTER — Ambulatory Visit: Admitting: Pulmonary Disease

## 2023-05-26 VITALS — BP 108/60 | HR 76 | Temp 97.6°F | Ht 68.0 in | Wt 124.2 lb

## 2023-05-26 DIAGNOSIS — R64 Cachexia: Secondary | ICD-10-CM | POA: Diagnosis not present

## 2023-05-26 DIAGNOSIS — J449 Chronic obstructive pulmonary disease, unspecified: Secondary | ICD-10-CM

## 2023-05-26 DIAGNOSIS — J9611 Chronic respiratory failure with hypoxia: Secondary | ICD-10-CM

## 2023-05-26 DIAGNOSIS — J441 Chronic obstructive pulmonary disease with (acute) exacerbation: Secondary | ICD-10-CM | POA: Diagnosis not present

## 2023-05-26 DIAGNOSIS — F17210 Nicotine dependence, cigarettes, uncomplicated: Secondary | ICD-10-CM

## 2023-05-26 MED ORDER — GUAIFENESIN-CODEINE 100-10 MG/5ML PO SOLN: 5.0000 mL | Freq: Four times a day (QID) | ORAL | 0 refills | Status: DC | PRN

## 2023-05-26 MED ORDER — ALBUTEROL SULFATE (2.5 MG/3ML) 0.083% IN NEBU: 2.5000 mg | INHALATION_SOLUTION | Freq: Four times a day (QID) | RESPIRATORY_TRACT | 12 refills | Status: AC | PRN

## 2023-05-26 MED ORDER — METHYLPREDNISOLONE 4 MG PO TBPK: ORAL_TABLET | ORAL | 0 refills | Status: DC

## 2023-05-26 NOTE — Progress Notes (Signed)
 Subjective:    Patient ID: Elijah Mcfarland, male    DOB: 11/08/1950, 73 y.o.   MRN: [id number]  Patient Care Team: Carollynn Cirri, NP as PCP - General (Internal Medicine) Constancia Delton, MD as PCP - Cardiology (Cardiology) Alla Isaacs, Severa Daniels, RPH-CPP as Pharmacist Marc Senior, MD as Consulting Physician (Pulmonary Disease) Pa, [facility])  Chief Complaint  Patient presents with   Follow-up    Cough, shortness of breath and wheezing.     BACKGROUND/INTERVAL:73 year male, current smoker.  Past medical history significant for severe COPD, chronic respiratory failure, hypertension, aortic arthrosclerosis, GERD, arthritis, hyperlipidemia, macular degeneration, prediabetes, tobacco abuse and pulmonary cachexia.  Presents for follow-up.  This is a scheduled visit.  Last seen on 21 April 2023 by me. No recent exacerbations.  No admissions to the hospital, abnormal CT for lung cancer screening on 25 March 2023.   HPI Discussed the use of AI scribe software for clinical note transcription with the patient, who gave verbal consent to proceed.  History of Present Illness   Elijah Mcfarland is a 73 year old male with severe COPD and chronic respiratory failure with hypoxia who presents for follow-up.  He experiences frequent coughing, particularly in the mornings, and continuous coughing at other times. He uses supplemental oxygen  at home and is concerned about the safety of using it during the day while smoking. He struggles with smoking cessation due to anxiety and depression.  He uses albuterol  in the morning, which helps to alleviate his symptoms, and Breztri  twice daily, which improves his breathing. He wakes up between 1 to 3 AM due to coughing and has tried using a nebulizer before bed without significant relief. He has been prescribed prednisone  in the past, which he finds helpful, especially during times of increased pollen exposure when he feels 'a little  tight'. He requests a refill of albuterol  and mentions the need for more inhaled medication.  He smokes cigarettes, half a pack a day, attributing it to anxiety and depression, and finds it difficult to quit. He is considering moving his smoking area to the kitchen to manage his oxygen  use safely.     He has not had any fevers, chills or sweats.  Cough is productive of whitish sputum when he does produce sputum.  He will be due for follow-up CT chest around May.   Review of Systems A 10 point review of systems was performed and it is as noted above otherwise negative.   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   Stage 3 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 (HCC) 04/25/2006   Arthritis 04/25/2006    Social History   Tobacco Use   Smoking status: Every Day    Current packs/day: 0.50    Average packs/day: 2.9 packs/day for 54.3 years (159.6 ttl pk-yrs)    Types: Cigarettes    Start date: 1969   Smokeless tobacco: Never   Tobacco comments:    10 cigarettes daily- khj 04/21/2023    Start smoking at 73 years old  Smoked 3PPD at his heaviest.  Substance Use Topics   Alcohol use: No    Alcohol/week: 0.0 standard drinks of alcohol    Allergies  Allergen Reactions   Nsaids Other (See Comments)    GI bleed and vomiting    Current Meds  Medication Sig   acetaminophen  (TYLENOL ) 325 MG tablet Take 2 tablets (650 mg total) by mouth every 6 (six) hours as needed for  mild pain or headache (or Fever >/= 101).   albuterol  (VENTOLIN  HFA) 108 (90 Base) MCG/ACT inhaler INHALE 2 PUFFS INTO THE LUNGS EVERY 6 HOURS AS NEEDED FOR WHEEZING OR SHORTNESS OF BREATH   ALPRAZolam  (XANAX ) 0.5 MG tablet TAKE 1 TABLET BY MOUTH EVERY 6 HOURS AS NEEDED FOR ANXIETY   aspirin  EC 81 MG tablet Take 1 tablet (81 mg total) by mouth daily. Swallow whole.   atorvastatin  (LIPITOR) 40 MG tablet Take 1 tablet (40 mg total) by mouth daily.   Budeson-Glycopyrrol-Formoterol  (BREZTRI  AEROSPHERE) 160-9-4.8 MCG/ACT AERO Inhale 2 puffs into the lungs in the morning and at bedtime.   cetirizine (ZYRTEC) 10 MG tablet Take 10 mg by mouth as needed for allergies.   citalopram  (CELEXA ) 40 MG tablet TAKE 1 TABLET EVERY DAY   doxycycline  (VIBRA -TABS) 100 MG tablet Take 1 tablet (100 mg total) by mouth 2 (two) times daily.   Ferrous Sulfate (IRON) 325 (65 Fe) MG TABS Take 1 tablet by mouth in the morning and at bedtime.   guaiFENesin  (MUCINEX ) 600 MG 12 hr tablet Take 1,200 mg by mouth 2 (two) times daily.   losartan  (COZAAR ) 50 MG tablet TAKE 1/2 TABLET(25 MG) BY MOUTH DAILY   methylPREDNISolone  (MEDROL  DOSEPAK) 4 MG TBPK tablet Take as directed in the package.  This is a taper pack.   nicotine  polacrilex (NICORETTE) 4 MG gum Take 4 mg by mouth as needed for smoking cessation.   OXYGEN  Inhale 2 L into the lungs daily as needed (with activity). Wears nightly   phenytoin  (DILANTIN ) 100 MG ER capsule TAKE 3 CAPSULES EVERY DAY   predniSONE  (DELTASONE ) 10 MG tablet Take 6 tabs on day 1, 5 tabs on day 2, 4 tabs on day 3, 3 tabs on day 4, 2 tabs on day 5, 1 tab on day 6   tamsulosin  (FLOMAX ) 0.4 MG CAPS capsule TAKE 1 CAPSULE(0.4 MG) BY MOUTH DAILY   [DISCONTINUED] albuterol  (PROVENTIL ) (2.5 MG/3ML) 0.083% nebulizer solution Take 3 mLs (2.5 mg total) by nebulization every 6 (six) hours as needed for wheezing or shortness of breath.   [DISCONTINUED] guaiFENesin -codeine  100-10 MG/5ML syrup Take 5 mLs by mouth  every 6 (six) hours as needed for cough.    Immunization History  Administered Date(s) Administered   Fluad Quad(high Dose 65+) 11/17/2018, 02/05/2020, 03/10/2021, 03/15/2022   Fluad Trivalent(High Dose 65+) 12/21/2022   H1N1 01/05/2008   Influenza Whole 11/07/2007   Influenza, High Dose Seasonal PF 03/03/2018   Influenza, Seasonal, Injecte, Preservative Fre 04/13/2013   Influenza,inj,Quad PF,6+ Mos 11/14/2014, 02/19/2016, 02/18/2017   Moderna SARS-COV2 Booster Vaccination 12/26/2019   Moderna Sars-Covid-2 Vaccination 04/22/2019, 05/20/2019   Pneumococcal Conjugate-13 11/14/2014, 02/05/2020   Pneumococcal Polysaccharide-23 11/07/2007, 02/19/2016, 03/03/2018   Td 09/06/2008        Objective:     BP 108/60 (BP Location: Right Arm, Patient Position: Sitting, Cuff Size: Normal)   Pulse 76   Temp 97.6 F (36.4 C) (Temporal)   Ht 5\' 8"  (1.727 m)   Wt 124 lb 3.2 oz (56.3 kg)   SpO2 93%  BMI 18.88 kg/m   SpO2: 93 %  GENERAL: Thin, well-developed male, no overt respiratory distress, fully ambulatory.  Comfortable with nasal cannula O2 via POC. HEAD: Normocephalic, atraumatic. EYES: Pupils equal, round, reactive to light.  No scleral icterus. MOUTH: Dentures both, oral mucosa moist. NECK: Supple. No thyromegaly. No nodules. No JVD.  Trachea midline PULMONARY: Symmetrical air entry, no accessory muscle use noted today, scattered wheezes noted, no other adventitious sounds, breath sounds are coarse.  Positive Hoover's sign. CARDIOVASCULAR: S1 and S2. Regular rate and rhythm.  No overt murmurs, gallops or rubs noted. GASTROINTESTINAL: No distention.  Benign. MUSCULOSKELETAL: No joint deformity, no clubbing, no edema. NEUROLOGIC: No focal deficits, fluent speech, awake, alert. SKIN: Intact,warm,dry.  No rashes noted on limited exam. PSYCH: Normal mood and behavior        Assessment & Plan:     ICD-10-CM   1. Stage 3 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 (HCC)  J44.9     2.  COPD with acute exacerbation (HCC)  J44.1    Due to environmental allergies    3. Chronic respiratory failure with hypoxia (HCC)  J96.11     4. 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 Tobacco dependence due to cigarettes  F17.210       Meds ordered this encounter  Medications   methylPREDNISolone  (MEDROL  DOSEPAK) 4 MG TBPK tablet    Sig: Take as directed in the package.  This is a taper pack.    Dispense:  21 tablet    Refill:  0   albuterol  (PROVENTIL ) (2.5 MG/3ML) 0.083% nebulizer solution    Sig: Take 3 mLs (2.5 mg total) by nebulization every 6 (six) hours as needed for wheezing or shortness of breath.    Dispense:  75 mL    Refill:  12   guaiFENesin -codeine  100-10 MG/5ML syrup    Sig: Take 5 mLs by mouth every 6 (six) hours as needed for cough.    Dispense:  237 mL    Refill:  0   Discussion:    Chronic Obstructive Pulmonary Disease (COPD) with chronic respiratory failure and hypoxia He has severe COPD with chronic respiratory failure and hypoxia, experiencing significant coughing, especially in the mornings, and nocturnal awakenings due to coughing. He uses home oxygen  therapy and is concerned about daytime safety. Current medications include Breztri  and albuterol , which improve his breathing. Albuterol  is recommended upon waking to clear the lungs before Breztri . He experiences tightness, likely exacerbated by pollen, and benefits from prednisone . Concerns exist regarding general anesthesia due to potential ventilator weaning difficulties. - Continue Breztri  and albuterol  as prescribed - Use albuterol  upon waking to clear lungs before using Breztri  - Prescribe prednisone  for tightness due to pollen - Ensure he has a long hose for oxygen  concentrator - Advise on safe use of oxygen  therapy at home  Smoking-related anxiety and depression He smokes as a coping mechanism for anxiety and depression, exacerbating his COPD. He is aware of the risks, especially  with oxygen  therapy, and is considering smoking reduction strategies. - Discuss smoking cessation strategies and support options  Colorectal cancer screening He prefers a Cologuard test for colorectal cancer screening over colonoscopy under general anesthesia due to lung function concerns and potential ventilator weaning difficulties. - Advise to complete Cologuard test - Instruct to avoid red meat before the test to prevent false positives      Advised if symptoms do not improve or worsen, to please  contact office for sooner follow up or seek emergency care.    I spent 32 minutes of dedicated to the care of this patient on the date of this encounter to include pre-visit review of records, face-to-face time with the patient discussing conditions above, post visit ordering of testing, clinical documentation with the electronic health record, making appropriate referrals as documented, and communicating necessary findings to members of the patients care team.     C. Chloe Counter, MD Advanced Bronchoscopy PCCM [facility]    *This note was generated using voice recognition software/Dragon and/or AI transcription program.  Despite best efforts to proofread, errors can occur which can change the meaning. Any transcriptional errors that result from this process are unintentional and may not be fully corrected at the time of dictation.

## 2023-05-26 NOTE — Telephone Encounter (Signed)
 Requested medication (s) are due for refill today: yes   Requested medication (s) are on the active medication list: yes  Last refill:  flomax- 02/25/23 #90 0 refills, cozaar- 03/02/23 #45 0 refills  Future visit scheduled: no   Notes to clinic:  no refills remain. Do you want to refill Rxs?     Requested Prescriptions  Pending Prescriptions Disp Refills   tamsulosin (FLOMAX) 0.4 MG CAPS capsule [Pharmacy Med Name: TAMSULOSIN 0.4MG  CAPSULES] 90 capsule 0    Sig: TAKE 1 CAPSULE(0.4 MG) BY MOUTH DAILY     Urology: Alpha-Adrenergic Blocker Passed - 05/26/2023  8:46 AM      Passed - PSA in normal range and within 360 days    PSA  Date Value Ref Range Status  04/11/2023 0.96 < OR = 4.00 ng/mL Final    Comment:    The total PSA value from this assay system is  standardized against the WHO standard. The test  result will be approximately 20% lower when compared  to the equimolar-standardized total PSA (Beckman  Coulter). Comparison of serial PSA results should be  interpreted with this fact in mind. . This test was performed using the Siemens  chemiluminescent method. Values obtained from  different assay methods cannot be used interchangeably. PSA levels, regardless of value, should not be interpreted as absolute evidence of the presence or absence of disease.   02/05/2020 0.92 0.10 - 4.00 ng/ml Final    Comment:    Test performed using Access Hybritech PSA Assay, a parmagnetic partical, chemiluminecent immunoassay.         Passed - Last BP in normal range    BP Readings from Last 1 Encounters:  04/21/23 112/70         Passed - Valid encounter within last 12 months    Recent Outpatient Visits           1 month ago Encounter for general adult medical examination with abnormal findings   [place] [facility] [place], Salvadore Oxford, NP               losartan (COZAAR) 50 MG tablet [Pharmacy Med Name: LOSARTAN 50MG  TABLETS] 45 tablet 0    Sig: TAKE 1/2  TABLET(25 MG) BY MOUTH DAILY     Cardiovascular:  Angiotensin Receptor Blockers Failed - 05/26/2023  8:46 AM      Failed - Cr in normal range and within 180 days    Creat  Date Value Ref Range Status  04/11/2023 0.64 (L) 0.70 - 1.28 mg/dL Final   Creatinine,U  Date Value Ref Range Status  11/03/2012 46.42 mg/dL Final    Comment:       Cutoff Values for Urine Drug Screen, Pain Mgmt          Drug Class           Cutoff (ng/mL)          Amphetamines             500          Barbiturates             200          Cocaine Metabolites      150          Benzodiazepines          200          Methadone  300          Opiates                  300          Phencyclidine             25          Propoxyphene             300          Marijuana Metabolites     50    For medical purposes only.         Passed - K in normal range and within 180 days    Potassium  Date Value Ref Range Status  04/11/2023 4.1 3.5 - 5.3 mmol/L Final         Passed - Patient is not pregnant      Passed - Last BP in normal range    BP Readings from Last 1 Encounters:  04/21/23 112/70         Passed - Valid encounter within last 6 months    Recent Outpatient Visits           1 month ago Encounter for general adult medical examination with abnormal findings   [place] [facility] [place], Salvadore Oxford, NP

## 2023-05-26 NOTE — Patient Instructions (Signed)
 VISIT SUMMARY:  During your visit, we discussed your severe COPD and chronic respiratory failure, your struggles with smoking cessation, and your preference for colorectal cancer screening. We reviewed your current medications and addressed your concerns about using supplemental oxygen safely while smoking.  YOUR PLAN:  -CHRONIC OBSTRUCTIVE PULMONARY DISEASE (COPD) WITH CHRONIC RESPIRATORY FAILURE AND HYPOXIA: COPD is a chronic lung disease that makes it hard to breathe and can lead to respiratory failure and low oxygen levels. Continue using Breztri twice daily and albuterol in the morning to help with your breathing. Use albuterol upon waking to clear your lungs before using Breztri.  I sent prednisone taper pack that you should start taking tomorrow.  Hold your regular prednisone until you finish that pack.. Make sure you have a long hose for your oxygen concentrator and follow the advice on safe oxygen use at home.  -SMOKING-RELATED ANXIETY AND DEPRESSION: Smoking can worsen your COPD and is often used as a coping mechanism for anxiety and depression. We discussed strategies and support options to help you reduce or quit smoking, which is important for your lung health.  -COLORECTAL CANCER SCREENING: Colorectal cancer screening is important for early detection of cancer. You prefer the Cologuard test over a colonoscopy due to concerns about anesthesia and lung function. Please complete the Cologuard test and avoid red meat before the test to prevent false positives.  INSTRUCTIONS:  Please continue using your medications as prescribed and follow the safety advice for using oxygen at home. Complete the Cologuard test for colorectal cancer screening and avoid red meat before the test. Consider the smoking cessation strategies we discussed to help manage your anxiety and depression.  Follow-up in 2 months time call sooner should any problems arise.

## 2023-05-26 NOTE — Telephone Encounter (Signed)
 Requested Prescriptions  Pending Prescriptions Disp Refills   citalopram (CELEXA) 40 MG tablet [Pharmacy Med Name: Citalopram Hydrobromide Oral Tablet 40 MG] 90 tablet 1    Sig: TAKE 1 TABLET EVERY DAY     Psychiatry:  Antidepressants - SSRI Passed - 05/26/2023  9:24 AM      Passed - Valid encounter within last 6 months    Recent Outpatient Visits           1 month ago Encounter for general adult medical examination with abnormal findings   [place] [facility] [place], Salvadore Oxford, NP

## 2023-06-16 ENCOUNTER — Other Ambulatory Visit: Payer: Self-pay | Admitting: Pulmonary Disease

## 2023-06-16 ENCOUNTER — Other Ambulatory Visit: Payer: Self-pay | Admitting: Internal Medicine

## 2023-06-16 NOTE — Telephone Encounter (Signed)
 Requested medication (s) are due for refill today - yes  Requested medication (s) are on the active medication list -yes  Future visit scheduled -no  Last refill: 05/18/23 #120  Notes to clinic: non delegated Rx  Requested Prescriptions  Pending Prescriptions Disp Refills   ALPRAZolam  (XANAX ) 0.5 MG tablet [Pharmacy Med Name: ALPRAZOLAM  0.5MG  TABLETS] 120 tablet     Sig: TAKE 1 TABLET BY MOUTH EVERY 6 HOURS AS NEEDED FOR ANXIETY     Not Delegated - Psychiatry: Anxiolytics/Hypnotics 2 Failed - 06/16/2023  9:56 AM      Failed - This refill cannot be delegated      Failed - Urine Drug Screen completed in last 360 days      Passed - Patient is not pregnant      Passed - Valid encounter within last 6 months    Recent Outpatient Visits           2 months ago Encounter for general adult medical examination with abnormal findings   [place] [facility] [place], Rankin Buzzard, NP                 Requested Prescriptions  Pending Prescriptions Disp Refills   ALPRAZolam  (XANAX ) 0.5 MG tablet [Pharmacy Med Name: ALPRAZOLAM  0.5MG  TABLETS] 120 tablet     Sig: TAKE 1 TABLET BY MOUTH EVERY 6 HOURS AS NEEDED FOR ANXIETY     Not Delegated - Psychiatry: Anxiolytics/Hypnotics 2 Failed - 06/16/2023  9:56 AM      Failed - This refill cannot be delegated      Failed - Urine Drug Screen completed in last 360 days      Passed - Patient is not pregnant      Passed - Valid encounter within last 6 months    Recent Outpatient Visits           2 months ago Encounter for general adult medical examination with abnormal findings   [place] [facility] [place], Rankin Buzzard, NP

## 2023-06-17 LAB — COLOGUARD

## 2023-06-20 NOTE — Telephone Encounter (Signed)
 It is too soon to fill this prescription again.  Lets try him taking Mucinex  DM extra strength over-the-counter twice a day.  Also needs to stop smoking as that will increase the cough.

## 2023-06-20 NOTE — Telephone Encounter (Signed)
 Pt has been notified of recommendations and will try the suggested medication.

## 2023-07-01 ENCOUNTER — Ambulatory Visit
Admission: RE | Admit: 2023-07-01 | Discharge: 2023-07-01 | Disposition: A | Discharge status: Home / Self Care | Source: Ambulatory Visit | Attending: Acute Care | Admitting: Acute Care

## 2023-07-01 DIAGNOSIS — F17210 Nicotine dependence, cigarettes, uncomplicated: Secondary | ICD-10-CM | POA: Insufficient documentation

## 2023-07-01 DIAGNOSIS — Z87891 Personal history of nicotine dependence: Secondary | ICD-10-CM | POA: Diagnosis present

## 2023-07-01 DIAGNOSIS — Z122 Encounter for screening for malignant neoplasm of respiratory organs: Secondary | ICD-10-CM | POA: Diagnosis present

## 2023-07-01 DIAGNOSIS — R911 Solitary pulmonary nodule: Secondary | ICD-10-CM | POA: Insufficient documentation

## 2023-07-07 LAB — COLOGUARD

## 2023-07-15 ENCOUNTER — Other Ambulatory Visit: Payer: Self-pay | Admitting: Internal Medicine

## 2023-07-15 NOTE — Telephone Encounter (Signed)
 Requested medication (s) are due for refill today: yes  Requested medication (s) are on the active medication list: yes  Last refill:  06/16/23 #120/0  Future visit scheduled: no  Notes to clinic:  Unable to refill per protocol, cannot delegate.      Requested Prescriptions  Pending Prescriptions Disp Refills   ALPRAZolam  (XANAX ) 0.5 MG tablet [Pharmacy Med Name: ALPRAZOLAM  0.5MG  TABLETS] 120 tablet     Sig: TAKE 1 TABLET BY MOUTH EVERY 6 HOURS AS NEEDED FOR ANXIETY     Not Delegated - Psychiatry: Anxiolytics/Hypnotics 2 Failed - 07/15/2023  4:47 PM      Failed - This refill cannot be delegated      Failed - Urine Drug Screen completed in last 360 days      Passed - Patient is not pregnant      Passed - Valid encounter within last 6 months    Recent Outpatient Visits           3 months ago Encounter for general adult medical examination with abnormal findings   [place] [facility] [place], Rankin Buzzard, NP

## 2023-08-15 ENCOUNTER — Other Ambulatory Visit: Payer: Self-pay | Admitting: Internal Medicine

## 2023-08-16 NOTE — Telephone Encounter (Signed)
 Requested medications are due for refill today.  yes  Requested medications are on the active medications list.  yes  Last refill. 07/18/2023 #120 0 rf  Future visit scheduled.   yes  Notes to clinic.  Refill not delegated.    Requested Prescriptions  Pending Prescriptions Disp Refills   ALPRAZolam  (XANAX ) 0.5 MG tablet [Pharmacy Med Name: ALPRAZOLAM  0.5MG  TABLETS] 120 tablet     Sig: TAKE 1 TABLET BY MOUTH EVERY 6 HOURS AS NEEDED FOR ANXIETY     Not Delegated - Psychiatry: Anxiolytics/Hypnotics 2 Failed - 08/16/2023  5:48 PM      Failed - This refill cannot be delegated      Failed - Urine Drug Screen completed in last 360 days      Passed - Patient is not pregnant      Passed - Valid encounter within last 6 months    Recent Outpatient Visits           4 months ago Encounter for general adult medical examination with abnormal findings   [place] [facility] [place], Angeline ORN, NP

## 2023-08-17 LAB — COLOGUARD: COLOGUARD: NEGATIVE

## 2023-08-18 ENCOUNTER — Ambulatory Visit: Payer: Self-pay | Admitting: Internal Medicine

## 2023-08-22 ENCOUNTER — Other Ambulatory Visit: Payer: Self-pay | Admitting: Internal Medicine

## 2023-08-22 DIAGNOSIS — I10 Essential (primary) hypertension: Secondary | ICD-10-CM

## 2023-08-23 NOTE — Telephone Encounter (Signed)
 Requested Prescriptions  Pending Prescriptions Disp Refills   tamsulosin  (FLOMAX ) 0.4 MG CAPS capsule [Pharmacy Med Name: TAMSULOSIN  0.4MG  CAPSULES] 90 capsule 1    Sig: TAKE 1 CAPSULE(0.4 MG) BY MOUTH DAILY     Urology: Alpha-Adrenergic Blocker Passed - 08/23/2023  4:12 PM      Passed - PSA in normal range and within 360 days    PSA  Date Value Ref Range Status  04/11/2023 0.96 < OR = 4.00 ng/mL Final    Comment:    The total PSA value from this assay system is  standardized against the WHO standard. The test  result will be approximately 20% lower when compared  to the equimolar-standardized total PSA (Beckman  Coulter). Comparison of serial PSA results should be  interpreted with this fact in mind. . This test was performed using the Siemens  chemiluminescent method. Values obtained from  different assay methods cannot be used interchangeably. PSA levels, regardless of value, should not be interpreted as absolute evidence of the presence or absence of disease.   02/05/2020 0.92 0.10 - 4.00 ng/ml Final    Comment:    Test performed using Access Hybritech PSA Assay, a parmagnetic partical, chemiluminecent immunoassay.         Passed - Last BP in normal range    BP Readings from Last 1 Encounters:  05/26/23 108/60         Passed - Valid encounter within last 12 months    Recent Outpatient Visits           4 months ago Encounter for general adult medical examination with abnormal findings   [place] [facility] [place], Angeline ORN, NP               losartan  (COZAAR ) 50 MG tablet [Pharmacy Med Name: LOSARTAN  50MG  TABLETS] 45 tablet 0    Sig: TAKE 1/2 TABLET(25 MG) BY MOUTH DAILY     Cardiovascular:  Angiotensin Receptor Blockers Failed - 08/23/2023  4:12 PM      Failed - Cr in normal range and within 180 days    Creat  Date Value Ref Range Status  04/11/2023 0.64 (L) 0.70 - 1.28 mg/dL Final   Creatinine,U  Date Value Ref Range Status  11/03/2012  46.42 mg/dL Final    Comment:       Cutoff Values for Urine Drug Screen, Pain Mgmt          Drug Class           Cutoff (ng/mL)          Amphetamines             500          Barbiturates             200          Cocaine Metabolites      150          Benzodiazepines          200          Methadone                300          Opiates                  300          Phencyclidine             25  Propoxyphene             300          Marijuana Metabolites     50    For medical purposes only.         Passed - K in normal range and within 180 days    Potassium  Date Value Ref Range Status  04/11/2023 4.1 3.5 - 5.3 mmol/L Final         Passed - Patient is not pregnant      Passed - Last BP in normal range    BP Readings from Last 1 Encounters:  05/26/23 108/60         Passed - Valid encounter within last 6 months    Recent Outpatient Visits           4 months ago Encounter for general adult medical examination with abnormal findings   [place] [facility] [place], Angeline ORN, NP

## 2023-09-05 ENCOUNTER — Other Ambulatory Visit: Payer: Self-pay

## 2023-09-06 ENCOUNTER — Encounter: Payer: Self-pay | Admitting: Pulmonary Disease

## 2023-09-06 ENCOUNTER — Ambulatory Visit: Admitting: Pulmonary Disease

## 2023-09-06 VITALS — BP 126/72 | HR 87 | Ht 68.0 in | Wt 115.0 lb

## 2023-09-06 DIAGNOSIS — R64 Cachexia: Secondary | ICD-10-CM | POA: Diagnosis not present

## 2023-09-06 DIAGNOSIS — J449 Chronic obstructive pulmonary disease, unspecified: Secondary | ICD-10-CM

## 2023-09-06 DIAGNOSIS — J9611 Chronic respiratory failure with hypoxia: Secondary | ICD-10-CM

## 2023-09-06 DIAGNOSIS — R053 Chronic cough: Secondary | ICD-10-CM

## 2023-09-06 DIAGNOSIS — F17210 Nicotine dependence, cigarettes, uncomplicated: Secondary | ICD-10-CM

## 2023-09-06 MED ORDER — PROMETHAZINE-DM 6.25-15 MG/5ML PO SYRP
5.0000 mL | ORAL_SOLUTION | Freq: Two times a day (BID) | ORAL | 0 refills | Status: DC | PRN
Start: 2023-09-06 — End: 2023-10-13

## 2023-09-06 MED ORDER — ALBUTEROL SULFATE HFA 108 (90 BASE) MCG/ACT IN AERS
2.0000 | INHALATION_SPRAY | Freq: Four times a day (QID) | RESPIRATORY_TRACT | 3 refills | Status: DC | PRN
Start: 2023-09-06 — End: 2023-12-19

## 2023-09-06 NOTE — Progress Notes (Unsigned)
 Subjective:    Patient ID: Elijah Mcfarland, male    DOB: Apr 23, 1950, 73 y.o.   MRN: [id number]  Patient Care Team: Antonette Angeline ORN, NP as PCP - General (Internal Medicine) Darliss Rogue, MD as PCP - Cardiology (Cardiology) Alana Sharyle LABOR, RPH-CPP as Pharmacist Tamea Dedra CROME, MD as Consulting Physician (Pulmonary Disease) [facility], [facility])  Chief Complaint  Patient presents with   COPD    BACKGROUND/INTERVAL:73 year male, current smoker.  Past medical history significant for severe COPD, chronic respiratory failure, hypertension, aortic arthrosclerosis, GERD, arthritis, hyperlipidemia, macular degeneration, prediabetes, tobacco abuse and pulmonary cachexia.  Presents for follow-up.  This is a scheduled visit.  Last seen on 26 May 2023 by me. No recent exacerbations.  No admissions to the hospital, had follow-up CT for abnormal CT lung cancer screening on 03 April 2023.   HPI Discussed the use of AI scribe software for clinical note transcription with the patient, who gave verbal consent to proceed.  History of Present Illness   Elijah Mcfarland is a 73 year old male with severe COPD,with chronic respiratory failure, who presents for follow-up and ongoing tobacco use and dependence.  He experiences a persistent cough that leads to urinary incontinence. He uses a cough syrup, taking a teaspoon in the morning and at night, which helps manage the cough and prevents nighttime episodes.  He finds quitting smoking to be extremely challenging despite acknowledging the difficulty.  He recently underwent a CT scan of the chest to follow-up on his abnormal lung cancer screening CT.  Some of the nodules had cleared up, but a few new ones appeared.  These are likely related to inflammatory changes.  He will have follow-up CT chest in 6 months.  He uses Breztri  and requires refills for his inhalers. He also needs his cough syrup refilled, which he mistakenly ordered  earlier than needed.  He shares a recent family history of his sister's passing due to a kidney infection that led to sepsis, resulting in her death at the age of 51. She lived with him, and he found her in a critical state.  He has been emotionally affected by this.  His cough has been for the most part productive of whitish sputum.  No hemoptysis.  He unfortunately continues to smoke half a pack of cigarettes per day.  He is compliant with oxygen  therapy for chronic respiratory failure with hypoxia.  Notes the benefit of the therapy.    Review of Systems A 10 point review of systems was performed and it is as noted above otherwise negative.   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   Stage 3 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 (HCC) 04/25/2006   Arthritis 04/25/2006    Social History   Tobacco Use   Smoking status: Every Day    Current packs/day: 0.50    Average packs/day: 2.9 packs/day for 54.6 years (159.8 ttl pk-yrs)    Types: Cigarettes    Start date: 1971   Smokeless tobacco: Never   Tobacco comments:  10 cigarettes daily- khj 04/21/2023    Start smoking at 73 years old    Smoked 3PPD at his heaviest.  Substance Use Topics   Alcohol use: No    Alcohol/week: 0.0 standard drinks of alcohol    Allergies  Allergen Reactions   Nsaids Other (See Comments)    GI bleed and vomiting    Current Meds  Medication Sig   acetaminophen  (TYLENOL ) 325 MG  tablet Take 2 tablets (650 mg total) by mouth every 6 (six) hours as needed for mild pain or headache (or Fever >/= 101).   albuterol  (PROVENTIL ) (2.5 MG/3ML) 0.083% nebulizer solution Take 3 mLs (2.5 mg total) by nebulization every 6 (six) hours as needed for wheezing or shortness of breath.   ALPRAZolam  (XANAX ) 0.5 MG tablet TAKE 1 TABLET BY MOUTH EVERY 6 HOURS AS NEEDED FOR ANXIETY   aspirin  EC 81 MG tablet Take 1 tablet (81 mg total) by mouth daily. Swallow whole.   atorvastatin  (LIPITOR) 40 MG tablet Take 1 tablet (40 mg total) by mouth daily.   Budeson-Glycopyrrol-Formoterol  (BREZTRI  AEROSPHERE) 160-9-4.8 MCG/ACT AERO Inhale 2 puffs into the lungs in the morning and at bedtime.   cetirizine (ZYRTEC) 10 MG tablet Take 10 mg by mouth as needed for allergies.   citalopram  (CELEXA ) 40 MG tablet TAKE 1 TABLET EVERY DAY   Ferrous Sulfate (IRON) 325 (65 Fe) MG TABS Take 1 tablet by mouth in the morning and at bedtime.   furosemide  (LASIX ) 20 MG tablet Take 1 tablet (20 mg total) by mouth daily.   guaiFENesin  (MUCINEX ) 600 MG 12 hr tablet Take 1,200 mg by mouth 2 (two) times daily.   losartan  (COZAAR ) 50 MG tablet TAKE 1/2 TABLET(25 MG) BY MOUTH DAILY   nicotine  polacrilex (NICORETTE) 4 MG gum Take 4 mg by mouth as needed for smoking cessation.   OXYGEN  Inhale 2 L into the lungs daily as needed (with activity). Wears nightly   phenytoin  (DILANTIN ) 100 MG ER capsule TAKE 3 CAPSULES EVERY DAY   promethazine -dextromethorphan (PROMETHAZINE -DM) 6.25-15 MG/5ML syrup Take 5 mLs by mouth 2 (two) times daily as needed for cough.   tamsulosin  (FLOMAX ) 0.4 MG CAPS capsule TAKE 1 CAPSULE(0.4 MG) BY MOUTH DAILY   [DISCONTINUED] albuterol  (VENTOLIN  HFA) 108 (90 Base) MCG/ACT inhaler INHALE 2 PUFFS INTO THE LUNGS EVERY 6 HOURS AS NEEDED FOR WHEEZING OR SHORTNESS OF BREATH    Immunization History  Administered Date(s) Administered   Fluad Quad(high Dose 65+) 11/17/2018, 02/05/2020, 03/10/2021, 03/15/2022    Fluad Trivalent(High Dose 65+) 12/21/2022   H1N1 01/05/2008   Influenza Whole 11/07/2007   Influenza, High Dose Seasonal PF 03/03/2018   Influenza, Seasonal, Injecte, Preservative Fre 04/13/2013   Influenza,inj,Quad PF,6+ Mos 11/14/2014, 02/19/2016, 02/18/2017   Moderna SARS-COV2 Booster Vaccination 12/26/2019   Moderna Sars-Covid-2 Vaccination 04/22/2019, 05/20/2019   Pneumococcal Conjugate-13 11/14/2014, 02/05/2020   Pneumococcal Polysaccharide-23 11/07/2007, 02/19/2016, 03/03/2018   Td 09/06/2008        Objective:     BP 126/72   Pulse 87   Ht 5' 8 (1.727 m)   Wt 115 lb (52.2 kg)   SpO2 91% Comment: 2L CONT  BMI 17.49 kg/m   SpO2: 91 % (2L CONT)  GENERAL: Thin, well-developed male, no overt respiratory distress, fully ambulatory.  Comfortable with nasal cannula O2 via POC. HEAD: Normocephalic, atraumatic. EYES: Pupils equal, round, reactive to light.  No scleral icterus. MOUTH: Dentures both, oral mucosa moist. NECK: Supple. No thyromegaly. No nodules. No JVD.  Trachea midline PULMONARY:  Symmetrical air entry, no accessory muscle use noted today, scattered wheezes noted, no other adventitious sounds, breath sounds are coarse.  Positive Hoover's sign. CARDIOVASCULAR: S1 and S2. Regular rate and rhythm.  No overt murmurs, gallops or rubs noted. GASTROINTESTINAL: No distention.  Benign. MUSCULOSKELETAL: No joint deformity, no clubbing, no edema. NEUROLOGIC: No focal deficits, fluent speech, awake, alert. SKIN: Intact,warm,dry.  No rashes noted on limited exam. PSYCH: Normal mood and behavior        Assessment & Plan:     ICD-10-CM   1. Stage 3 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 (HCC)  J44.9     2. Chronic respiratory failure with hypoxia (HCC)  J96.11     3. Chronic cough  R05.3     4. 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 Tobacco dependence due to cigarettes  F17.210       Meds ordered this encounter  Medications    promethazine -dextromethorphan (PROMETHAZINE -DM) 6.25-15 MG/5ML syrup    Sig: Take 5 mLs by mouth 2 (two) times daily as needed for cough.    Dispense:  180 mL    Refill:  0   albuterol  (VENTOLIN  HFA) 108 (90 Base) MCG/ACT inhaler    Sig: Inhale 2 puffs into the lungs every 6 (six) hours as needed for wheezing or shortness of breath.    Dispense:  18 g    Refill:  3   Discussion:    Severe COPD Severe COPD with ongoing symptoms including cough. Recent lung cancer screening showed improvement with some nodules clearing up, though new ones appeared likely due to inflammation or infection. Continues to use Breztri  inhaler effectively. Reports cough syrup helps manage symptoms, reducing nighttime coughing. - Prescribe nonnarcotic cough syrup for symptomatic relief of cough. - Refill inhalers as needed. - Continue Breztri  inhaler. - NEEDS TO QUIT SMOKING!  Tobacco use disorder Ongoing tobacco use contributing to COPD symptoms. Acknowledged difficulty in quitting smoking, which is crucial for managing COPD.     Advised if symptoms do not improve or worsen, to please contact office for sooner follow up or seek emergency care.    I spent 32 minutes of dedicated to the care of this patient on the date of this encounter to include pre-visit review of records, face-to-face time with the patient discussing conditions above, post visit ordering of testing, clinical documentation with the electronic health record, making appropriate referrals as documented, and communicating necessary findings to members of the patients care team.     C. Leita Sanders, MD Advanced Bronchoscopy PCCM [facility]    *This note was generated using voice recognition software/Dragon and/or AI transcription program.  Despite best efforts to proofread, errors can occur which can change the meaning. Any transcriptional errors that result from this process are unintentional and may not be fully corrected  at the time of dictation.

## 2023-09-06 NOTE — Patient Instructions (Signed)
 VISIT SUMMARY:  During your visit, we discussed your severe COPD and ongoing tobacco use. We reviewed your recent scan results and addressed your persistent cough and the need for medication refills. We also talked about your challenges with quitting smoking and your recent family history.  YOUR PLAN:  -SEVERE COPD: Chronic Obstructive Pulmonary Disease (COPD) is a long-term lung condition that makes it hard to breathe. Your recent scan showed some improvement, but new nodules appeared, likely due to inflammation or infection. Continue using your Breztri  inhaler as it helps manage your symptoms. We will also refill your cough syrup to help with your persistent cough.  -TOBACCO USE DISORDER: Tobacco use disorder means you are dependent on tobacco, which worsens your COPD symptoms. Quitting smoking is very challenging but is crucial for managing your COPD. We will continue to support you in your efforts to quit.  INSTRUCTIONS:  Please continue using your Breztri  inhaler and take your cough syrup as prescribed. We have refilled your medications. It is important to keep trying to quit smoking, and we are here to support you. Follow up with us  if you have any new or worsening symptoms or need additional support.

## 2023-09-12 ENCOUNTER — Other Ambulatory Visit: Payer: Self-pay

## 2023-09-12 MED ORDER — BREZTRI AEROSPHERE 160-9-4.8 MCG/ACT IN AERO: 2.0000 | INHALATION_SPRAY | Freq: Two times a day (BID) | RESPIRATORY_TRACT | 3 refills | Status: AC

## 2023-09-12 NOTE — Progress Notes (Signed)
 Per fax from [facility]- need a refill sent in for Breztri .  Refill has been sent in.  Nothing further needed.

## 2023-09-14 ENCOUNTER — Other Ambulatory Visit: Payer: Self-pay | Admitting: Internal Medicine

## 2023-09-15 NOTE — Telephone Encounter (Signed)
 Requested medications are due for refill today.  A little too soon  Requested medications are on the active medications list.  yes  Last refill. 08/17/2023 #120 0 rf  Future visit scheduled.   yes  Notes to clinic.  Refill not delegated.    Requested Prescriptions  Pending Prescriptions Disp Refills   ALPRAZolam  (XANAX ) 0.5 MG tablet [Pharmacy Med Name: ALPRAZOLAM  0.5MG  TABLETS] 120 tablet     Sig: TAKE 1 TABLET BY MOUTH EVERY 6 HOURS AS NEEDED FOR ANXIETY     Not Delegated - Psychiatry: Anxiolytics/Hypnotics 2 Failed - 09/15/2023  9:44 AM      Failed - This refill cannot be delegated      Failed - Urine Drug Screen completed in last 360 days      Passed - Patient is not pregnant      Passed - Valid encounter within last 6 months    Recent Outpatient Visits           5 months ago Encounter for general adult medical examination with abnormal findings   [place] [facility] [place], Angeline ORN, NP

## 2023-09-23 ENCOUNTER — Ambulatory Visit: Admitting: Internal Medicine

## 2023-10-03 ENCOUNTER — Encounter: Payer: Self-pay | Admitting: Internal Medicine

## 2023-10-10 ENCOUNTER — Ambulatory Visit: Payer: Self-pay

## 2023-10-10 NOTE — Telephone Encounter (Signed)
 Has he tried calling his pulmonologist? I am completely booked today but have plenty of openings tomorrow. If he feels he can not wait, would recommend urgent care

## 2023-10-10 NOTE — Telephone Encounter (Signed)
 Patient states he has started using his breathing machine and feels much better. He has an appointment scheduled on Thursday, will follow up then. Advised to go to ED or urgent care for worsening symptoms. Patient verbalized understanding.

## 2023-10-10 NOTE — Telephone Encounter (Signed)
 FYI Only or Action Required?: Action required by provider: request for appointment.  Patient was last seen in primary care on 04/11/2023 by Antonette Angeline ORN, NP.  Called Nurse Triage reporting Breathing Problem.  Symptoms began a week ago.  Interventions attempted: Nothing.  Symptoms are: gradually worsening. Asking to be worked in today, please advise pt.  Triage Disposition: See HCP Within 4 Hours (Or PCP Triage)  Patient/caregiver understands and will follow disposition?: Yes   Copied from CRM #[id number]. Topic: Clinical - Red Word Triage >> Oct 10, 2023  8:42 AM Berneda FALCON wrote: Red Word that prompted transfer to Nurse Triage: Patient states he has been wheezing and experieing shortness of breath since 10/05/23  Has COPD and Emphysema Answer Assessment - Initial Assessment Questions 1. ONSET: When did the cough begin?      Last Wednesday 2. SEVERITY: How bad is the cough today?      severe 3. SPUTUM: Describe the color of your sputum (e.g., none, dry cough; clear, white, yellow, green)     white 4. HEMOPTYSIS: Are you coughing up any blood? If Yes, ask: How much? (e.g., flecks, streaks, tablespoons, etc.)     no 5. DIFFICULTY BREATHING: Are you having difficulty breathing? If Yes, ask: How bad is it? (e.g., mild, moderate, severe)      mild 6. FEVER: Do you have a fever? If Yes, ask: What is your temperature, how was it measured, and when did it start?     no 7. CARDIAC HISTORY: Do you have any history of heart disease? (e.g., heart attack, congestive heart failure)      no 8. LUNG HISTORY: Do you have any history of lung disease?  (e.g., pulmonary embolus, asthma, emphysema)     COPD 9. PE RISK FACTORS: Do you have a history of blood clots? (or: recent major surgery, recent prolonged travel, bedridden)     NO 10. OTHER SYMPTOMS: Do you have any other symptoms? (e.g., runny nose, wheezing, chest pain)       Wheezing 11. PREGNANCY: Is there any  chance you are pregnant? When was your last menstrual period?       N/a 12. TRAVEL: Have you traveled out of the country in the last month? (e.g., travel history, exposures)       no  Protocols used: Cough - Acute Productive-A-AH  Reason for Disposition  [1] MILD difficulty breathing (e.g., minimal/no SOB at rest, SOB with walking, pulse < 100) AND [2] still present when not coughing  Answer Assessment - Initial Assessment Questions 1. ONSET: When did the cough begin?      Last Wednesday 2. SEVERITY: How bad is the cough today?      severe 3. SPUTUM: Describe the color of your sputum (e.g., none, dry cough; clear, white, yellow, green)     white 4. HEMOPTYSIS: Are you coughing up any blood? If Yes, ask: How much? (e.g., flecks, streaks, tablespoons, etc.)     no 5. DIFFICULTY BREATHING: Are you having difficulty breathing? If Yes, ask: How bad is it? (e.g., mild, moderate, severe)      mild 6. FEVER: Do you have a fever? If Yes, ask: What is your temperature, how was it measured, and when did it start?     no 7. CARDIAC HISTORY: Do you have any history of heart disease? (e.g., heart attack, congestive heart failure)      no 8. LUNG HISTORY: Do you have any history of lung disease?  (e.g., pulmonary embolus, asthma,  emphysema)     COPD 9. PE RISK FACTORS: Do you have a history of blood clots? (or: recent major surgery, recent prolonged travel, bedridden)     NO 10. OTHER SYMPTOMS: Do you have any other symptoms? (e.g., runny nose, wheezing, chest pain)       Wheezing 11. PREGNANCY: Is there any chance you are pregnant? When was your last menstrual period?       N/a 12. TRAVEL: Have you traveled out of the country in the last month? (e.g., travel history, exposures)       no  Protocols used: Cough - Acute Productive-A-AH

## 2023-10-13 ENCOUNTER — Ambulatory Visit (INDEPENDENT_AMBULATORY_CARE_PROVIDER_SITE_OTHER): Admitting: Internal Medicine

## 2023-10-13 ENCOUNTER — Encounter: Payer: Self-pay | Admitting: Internal Medicine

## 2023-10-13 VITALS — BP 124/72 | HR 90 | Ht 68.0 in | Wt 114.8 lb

## 2023-10-13 DIAGNOSIS — G40309 Generalized idiopathic epilepsy and epileptic syndromes, not intractable, without status epilepticus: Secondary | ICD-10-CM

## 2023-10-13 DIAGNOSIS — R7303 Prediabetes: Secondary | ICD-10-CM

## 2023-10-13 DIAGNOSIS — D508 Other iron deficiency anemias: Secondary | ICD-10-CM | POA: Diagnosis not present

## 2023-10-13 DIAGNOSIS — F419 Anxiety disorder, unspecified: Secondary | ICD-10-CM

## 2023-10-13 DIAGNOSIS — E782 Mixed hyperlipidemia: Secondary | ICD-10-CM | POA: Diagnosis not present

## 2023-10-13 DIAGNOSIS — J441 Chronic obstructive pulmonary disease with (acute) exacerbation: Secondary | ICD-10-CM | POA: Diagnosis not present

## 2023-10-13 DIAGNOSIS — F32A Depression, unspecified: Secondary | ICD-10-CM

## 2023-10-13 DIAGNOSIS — J9611 Chronic respiratory failure with hypoxia: Secondary | ICD-10-CM

## 2023-10-13 DIAGNOSIS — E440 Moderate protein-calorie malnutrition: Secondary | ICD-10-CM

## 2023-10-13 DIAGNOSIS — I700 Atherosclerosis of aorta: Secondary | ICD-10-CM

## 2023-10-13 DIAGNOSIS — K2101 Gastro-esophageal reflux disease with esophagitis, with bleeding: Secondary | ICD-10-CM

## 2023-10-13 DIAGNOSIS — I10 Essential (primary) hypertension: Secondary | ICD-10-CM

## 2023-10-13 DIAGNOSIS — M1990 Unspecified osteoarthritis, unspecified site: Secondary | ICD-10-CM

## 2023-10-13 DIAGNOSIS — I5032 Chronic diastolic (congestive) heart failure: Secondary | ICD-10-CM | POA: Diagnosis not present

## 2023-10-13 DIAGNOSIS — J449 Chronic obstructive pulmonary disease, unspecified: Secondary | ICD-10-CM | POA: Diagnosis not present

## 2023-10-13 DIAGNOSIS — H3530 Unspecified macular degeneration: Secondary | ICD-10-CM

## 2023-10-13 DIAGNOSIS — N401 Enlarged prostate with lower urinary tract symptoms: Secondary | ICD-10-CM

## 2023-10-13 MED ORDER — BUPROPION HCL ER (XL) 150 MG PO TB24: 150.0000 mg | ORAL_TABLET | Freq: Every day | ORAL | 1 refills | Status: DC

## 2023-10-13 MED ORDER — PREDNISONE 10 MG PO TABS: ORAL_TABLET | ORAL | 0 refills | Status: DC

## 2023-10-13 MED ORDER — AZITHROMYCIN 250 MG PO TABS: ORAL_TABLET | ORAL | 0 refills | Status: DC

## 2023-10-13 MED ORDER — ALPRAZOLAM 0.5 MG PO TABS: 0.5000 mg | ORAL_TABLET | Freq: Four times a day (QID) | ORAL | 0 refills | Status: DC | PRN

## 2023-10-13 NOTE — Assessment & Plan Note (Signed)
 Will check CBC and iron panel today Continue oral iron 325 mg twice daily

## 2023-10-13 NOTE — Assessment & Plan Note (Signed)
 Compensated Continue losartan  25 mg daily Not currently taking any diuretics He will continue to follow with cardiology

## 2023-10-13 NOTE — Assessment & Plan Note (Signed)
 C-Met and lipid profile today Encouraged him to consume a low-fat diet Continue atorvastatin  40 mg

## 2023-10-13 NOTE — Assessment & Plan Note (Signed)
 Avoid foods that trigger your reflux Continue tums as needed

## 2023-10-13 NOTE — Assessment & Plan Note (Signed)
 Continue flomax  0.4 mg daily Not following with urology

## 2023-10-13 NOTE — Assessment & Plan Note (Signed)
 C-Met and lipid profile today Encouraged him to consume low-fat diet Continue atorvastatin  40 mg and aspirin  81 mg daily

## 2023-10-13 NOTE — Assessment & Plan Note (Signed)
 Controlled on losartan  25 mg daily C-Met today

## 2023-10-13 NOTE — Assessment & Plan Note (Signed)
 Encourage high protein, high calorie diet

## 2023-10-13 NOTE — Assessment & Plan Note (Signed)
 Deteriorated Continue citalopram  40 mg, Xanax  0.5 mg 4 times daily Will add bupropion  150 mg XL daily Support offered

## 2023-10-13 NOTE — Assessment & Plan Note (Signed)
 Continue dilantin  300 mg daily Will check dilantin  level at next visit

## 2023-10-13 NOTE — Assessment & Plan Note (Signed)
No longer following with ophthalmology

## 2023-10-13 NOTE — Assessment & Plan Note (Signed)
-

## 2023-10-13 NOTE — Assessment & Plan Note (Signed)
 Continue breztri  160-9-4.8 mcg/act and albuterol  as needed Continue oxygen  at bedtime and with exertion He will continue to follow with pulmonology

## 2023-10-13 NOTE — Patient Instructions (Signed)
 Smoking Cessation You will learn methods to help you quit smoking and how quitting can help prevent a variety of health problems and improve your health. To view the content, go to this web address: https://pe.elsevier.[email or web address]  This video will expire on: 01/26/2025. If you need access to this video following this date, please reach out to the healthcare provider who assigned it to you. This information is not intended to replace advice given to you by your health care provider. Make sure you discuss any questions you have with your health care provider. Elsevier Patient Education  2024 [employer].

## 2023-10-13 NOTE — Progress Notes (Signed)
 Subjective:    Patient ID: Elijah Mcfarland, male    DOB: [birth year], 73 y.o.   MRN: [id number]  HPI  Patient presents to clinic today for 33-month follow-up of chronic conditions.  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 He used to get injections in his eyes but he reports they did not help.  He no longer follows with ophthalmology.  CHF: He denies lower extremity edema but does have shortness of breath.  He is taking losartan  but is not taking furosemide .  Echo from 01/2022 reviewed.  He follows with cardiology.  Anxiety and depression: Chronic, managed on citalopram  and xanax . He feels a little more depressed lately. He is no longer taking buspirone . He is not currently seeing a therapist.  He denies depression, SI/HI.  OA: Mainly in his shoulder, back and knees.  He takes tylenol  as needed with good relief of symptoms.  He follows with orthopedics.  COPD: He reports chronic cough and shortness of breath.  He is taking breztri  and albuterol  as prescribed.  He wears oxygen  at night or with exertion.  PFTs from 03/2013 reviewed.  He follows with pulmonology.  He does continue to smoke.  Seizure disorder: He denies recent seizures on dilantin .  He no longer follows with neurology.  HTN: His BP today is 128/64.  He is taking losartan  as prescribed.  ECG from 12/2022 reviewed.  Prediabetes: His last A1c was 5.4%, 03/2023.  He is not taking any oral diabetic medication at this time.  He does not check his sugars.  GERD: With history of GI bleed.  He takes take tums as needed.  He has been prescribed pantoprazole  in the past.  Upper GI from 02/2018 reviewed.  HLD with aortic atherosclerosis: His last LDL was 63, triglycerides 96, 03/2023.  He denies myalgias on atorvastatin .  He is taking aspirin  as well.  He tries to consume low-fat diet.  BPH: He reports mainly urinary urgency, hesitancy and incontinence.  He is taking flomax  as prescribed.  He does not follow with urology.  Iron deficiency  anemia: His last H/H was 12.1/35.8, 03/2023.  He is taking oral iron as prescribed.  He does not follow with hematology.  Review of Systems     Past Medical History:  Diagnosis Date   Chronic pain syndrome    Left knee   COPD (chronic obstructive pulmonary disease) (HCC)    DDD (degenerative disc disease), lumbar 11/2008   L5-S1 on plain film   Emphysema lung (HCC)    GAD (generalized anxiety disorder)    GERD (gastroesophageal reflux disease)    History of multiple pulmonary nodules 07/2010   Follow up CT scans showed resolution by 03/2011--NO MALIGNANCY.   History of pneumonia 06/2010   HTN (hypertension) 01/07/2014   Osteoarthritis of left knee    + past injury of this knee--tibia fracture?  +left leg shorter than right   Seizure (HCC)    Seizure disorder (HCC)    3 total seizures (first was 1995); neuro eval done and recommended lifetime phenytoin  (he had a seizure after coming off the med in the late 90s   Tobacco dependence     Current Outpatient Medications  Medication Sig Dispense Refill   acetaminophen  (TYLENOL ) 325 MG tablet Take 2 tablets (650 mg total) by mouth every 6 (six) hours as needed for mild pain or headache (or Fever >/= 101). 30 tablet 2   albuterol  (PROVENTIL ) (2.5 MG/3ML) 0.083% nebulizer solution Take 3 mLs (2.5 mg total) by nebulization every  6 (six) hours as needed for wheezing or shortness of breath. 75 mL 12   albuterol  (VENTOLIN  HFA) 108 (90 Base) MCG/ACT inhaler Inhale 2 puffs into the lungs every 6 (six) hours as needed for wheezing or shortness of breath. 18 g 3   ALPRAZolam  (XANAX ) 0.5 MG tablet TAKE 1 TABLET BY MOUTH EVERY 6 HOURS AS NEEDED FOR ANXIETY 120 tablet 0   aspirin  EC 81 MG tablet Take 1 tablet (81 mg total) by mouth daily. Swallow whole. 90 tablet 3   atorvastatin  (LIPITOR) 40 MG tablet Take 1 tablet (40 mg total) by mouth daily. 90 tablet 3   budesonide -glycopyrrolate-formoterol  (BREZTRI  AEROSPHERE) 160-9-4.8 MCG/ACT AERO inhaler Inhale 2  puffs into the lungs in the morning and at bedtime. 32.1 g 3   cetirizine (ZYRTEC) 10 MG tablet Take 10 mg by mouth as needed for allergies.     citalopram  (CELEXA ) 40 MG tablet TAKE 1 TABLET EVERY DAY 90 tablet 1   Ferrous Sulfate (IRON) 325 (65 Fe) MG TABS Take 1 tablet by mouth in the morning and at bedtime.     furosemide  (LASIX ) 20 MG tablet Take 1 tablet (20 mg total) by mouth daily.     guaiFENesin  (MUCINEX ) 600 MG 12 hr tablet Take 1,200 mg by mouth 2 (two) times daily.     losartan  (COZAAR ) 50 MG tablet TAKE 1/2 TABLET(25 MG) BY MOUTH DAILY 45 tablet 0   nicotine  polacrilex (NICORETTE) 4 MG gum Take 4 mg by mouth as needed for smoking cessation.     OXYGEN  Inhale 2 L into the lungs daily as needed (with activity). Wears nightly     phenytoin  (DILANTIN ) 100 MG ER capsule TAKE 3 CAPSULES EVERY DAY 270 capsule 3   promethazine -dextromethorphan (PROMETHAZINE -DM) 6.25-15 MG/5ML syrup Take 5 mLs by mouth 2 (two) times daily as needed for cough. 180 mL 0   tamsulosin  (FLOMAX ) 0.4 MG CAPS capsule TAKE 1 CAPSULE(0.4 MG) BY MOUTH DAILY 90 capsule 1   No current facility-administered medications for this visit.    Allergies  Allergen Reactions   Nsaids Other (See Comments)    GI bleed and vomiting    Family History  Problem Relation Age of Onset   Heart disease Mother    Heart disease Father    Cancer Sister    Cirrhosis Sister    Cirrhosis Brother    Liver cancer Cousin        Materinal Side   Stroke Neg Hx     Social History   Socioeconomic History   Marital status: Married    Spouse name: Not on file   Number of children: Not on file   Years of education: Not on file   Highest education level: Not on file  Occupational History   Not on file  Tobacco Use   Smoking status: Every Day    Current packs/day: 0.50    Average packs/day: 2.9 packs/day for 54.7 years (159.8 ttl pk-yrs)    Types: Cigarettes    Start date: 31   Smokeless tobacco: Never   Tobacco comments:     10 cigarettes daily- khj 04/21/2023    Start smoking at 73 years old    Smoked 3PPD at his heaviest.  Vaping Use   Vaping status: Never Used  Substance and Sexual Activity   Alcohol use: No    Alcohol/week: 0.0 standard drinks of alcohol   Drug use: Not Currently    Types: Marijuana    Comment: 2 month  Sexual activity: Yes    Partners: Male  Other Topics Concern   Not on file  Social History Narrative   Married, 2 grown children   Eighth grade education.  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   Originally from [place].   Tobacco 100 pack-yr hx, still smoking as of 10/2011 (1 pack per day).   Denies alcohol or drug use.   No exercise.   Social Drivers of Corporate investment banker Strain: Low Risk  (03/11/2023)   Overall Financial Resource Strain (CARDIA)    Difficulty of Paying Living Expenses: Not hard at all  Food Insecurity: No Food Insecurity (03/11/2023)   Hunger Vital Sign    Worried About Running Out of Food in the Last Year: Never true    Ran Out of Food in the Last Year: Never true  Transportation Needs: No Transportation Needs (03/11/2023)   PRAPARE - Administrator, Civil Service (Medical): No    Lack of Transportation (Non-Medical): No  Physical Activity: Sufficiently Active (03/11/2023)   Exercise Vital Sign    Days of Exercise per Week: 5 days    Minutes of Exercise per Session: 30 min  Stress: No Stress Concern Present (03/11/2023)   [employer] of Occupational Health - Occupational Stress Questionnaire    Feeling of Stress : Not at all  Social Connections: Moderately Isolated (03/11/2023)   Social Connection and Isolation Panel    Frequency of Communication with Friends and Family: More than three times a week    Frequency of Social Gatherings with Friends and Family: More than three times a week    Attends Religious Services: Never    Database administrator or Organizations: No    Attends Banker  Meetings: Never    Marital Status: Married  Catering manager Violence: Not At Risk (03/11/2023)   Humiliation, Afraid, Rape, and Kick questionnaire    Fear of Current or Ex-Partner: No    Emotionally Abused: No    Physically Abused: No    Sexually Abused: No     Constitutional: Pt reports fatigue. Denies fever, malaise, headache or abrupt weight changes.  HEENT: Patient reports difficulty with vision, runny nose, nasal congestion.  Denies eye pain, eye redness, ringing in the ears, ear pain, wax buildup, bloody nose, or sore throat. Respiratory: Patient reports chronic cough, shortness of breath.  Denies difficulty breathing, or sputum production.   Cardiovascular: Denies chest pain, chest tightness, palpitations or swelling in the hands or feet.  Gastrointestinal: Denies abdominal pain, bloating, constipation, diarrhea or blood in the stool.  GU: Patient reports urinary urgency, hesitancy and nocturia.  Denies frequency, pain with urination, burning sensation, blood in urine, odor or discharge. Musculoskeletal: Patient reports joint pain, generalized weakness.  Denies decrease in range of motion, difficulty with gait, muscle pain or joint swelling.  Skin: Denies redness, rashes, lesions or ulcercations.  Neurological: Denies dizziness, difficulty with memory, difficulty with speech or problems with balance and coordination.  Psych: Patient has a history of anxiety and depression.  Denies  SI/HI.  No other specific complaints in a complete review of systems (except as listed in HPI above).  Objective:   Physical Exam  BP 124/72 (BP Location: Right Arm, Patient Position: Sitting, Cuff Size: Normal)   Pulse 90   Ht 5' 8 (1.727 m)   Wt 114 lb 12.8 oz (52.1 kg)   SpO2 99%   BMI 17.46 kg/m   Wt Readings from Last  3 Encounters:  09/06/23 115 lb (52.2 kg)  05/26/23 124 lb 3.2 oz (56.3 kg)  04/21/23 121 lb 6.4 oz (55.1 kg)    General: Appears his stated age, underweight, chronically  ill appearing, in NAD. Skin: Warm, dry and intact.  He looks pale today. HEENT: Head: normal shape and size; Eyes: sclera white, no icterus, conjunctiva pink, PERRLA and EOMs intact; ears: Bilateral cerumen impaction Cardiovascular: Normal rate and rhythm. S1,S2 noted.  No murmur, rubs or gallops noted. No JVD or BLE edema. No carotid bruits noted. Pulmonary/Chest: Increased l effort and coarse breath sounds with bilateral inspiratory and extra wheezing. No respiratory distress. No rales or ronchi noted.  Abdomen:Normal bowel sounds.  Musculoskeletal: Shuffling gait without assistive device. Neurological: Alert and oriented.  Coordination normal.  Psychiatric: Mood and affect normal. Mildly anxious appearing. Judgment and thought content normal.    BMET    Component Value Date/Time   NA 137 04/11/2023 1414   K 4.1 04/11/2023 1414   CL 98 04/11/2023 1414   CO2 30 04/11/2023 1414   GLUCOSE 85 04/11/2023 1414   BUN 8 04/11/2023 1414   CREATININE 0.64 (L) 04/11/2023 1414   CALCIUM  8.6 04/11/2023 1414   GFRNONAA >60 05/05/2022 1512   GFRAA >60 03/10/2018 0506    Lipid Panel     Component Value Date/Time   CHOL 144 04/11/2023 1414   TRIG 96 04/11/2023 1414   HDL 63 04/11/2023 1414   CHOLHDL 2.3 04/11/2023 1414   VLDL 26 08/27/2022 1518   LDLCALC 63 04/11/2023 1414    CBC    Component Value Date/Time   WBC 9.2 04/11/2023 1414   RBC 3.85 (L) 04/11/2023 1414   HGB 12.1 (L) 04/11/2023 1414   HCT 35.8 (L) 04/11/2023 1414   PLT 190 04/11/2023 1414   MCV 93.0 04/11/2023 1414   MCH 31.4 04/11/2023 1414   MCHC 33.8 04/11/2023 1414   RDW 13.0 04/11/2023 1414   LYMPHSABS 1.9 07/06/2022 1629   MONOABS 0.8 07/06/2022 1629   EOSABS 1.2 (H) 07/06/2022 1629   BASOSABS 0.1 07/06/2022 1629    Hgb A1C Lab Results  Component Value Date   HGBA1C 5.4 04/11/2023            Assessment & Plan:   COPD exacerbation:  Encouraged smoking cessation Rx for Pred taper x 6 days Rx  for azithromycin  250 mg x 5 days  RTC in 6 months for your annual exam Angeline Laura, NP

## 2023-10-13 NOTE — Assessment & Plan Note (Signed)
 Continue tylenol  OTC as needed

## 2023-10-13 NOTE — Assessment & Plan Note (Signed)
 Continue breztri  160-9-4.8 and albuterol  as needed Continue oxygen  at bedtime and with exertion He will continue to follow with pulmonology

## 2023-10-14 ENCOUNTER — Ambulatory Visit: Payer: Self-pay | Admitting: Internal Medicine

## 2023-10-14 LAB — CBC WITH DIFFERENTIAL/PLATELET
Absolute Lymphocytes: 2440 {cells}/uL (ref 850–3900)
Absolute Monocytes: 920 {cells}/uL (ref 200–950)
Basophils Absolute: 40 {cells}/uL (ref 0–200)
Basophils Relative: 0.4 %
Eosinophils Absolute: 730 {cells}/uL — ABNORMAL HIGH (ref 15–500)
Eosinophils Relative: 7.3 %
HCT: 37.9 % — ABNORMAL LOW (ref 38.5–50.0)
Hemoglobin: 12.6 g/dL — ABNORMAL LOW (ref 13.2–17.1)
MCH: 31.6 pg (ref 27.0–33.0)
MCHC: 33.2 g/dL (ref 32.0–36.0)
MCV: 95 fL (ref 80.0–100.0)
MPV: 9.7 fL (ref 7.5–12.5)
Monocytes Relative: 9.2 %
Neutro Abs: 5870 {cells}/uL (ref 1500–7800)
Neutrophils Relative %: 58.7 %
Platelets: 220 Thousand/uL (ref 140–400)
RBC: 3.99 Million/uL — ABNORMAL LOW (ref 4.20–5.80)
RDW: 13.6 % (ref 11.0–15.0)
Total Lymphocyte: 24.4 %
WBC: 10 Thousand/uL (ref 3.8–10.8)

## 2023-10-14 LAB — COMPREHENSIVE METABOLIC PANEL WITH GFR
AG Ratio: 1.6 (calc) (ref 1.0–2.5)
ALT: 22 U/L (ref 9–46)
AST: 20 U/L (ref 10–35)
Albumin: 4 g/dL (ref 3.6–5.1)
Alkaline phosphatase (APISO): 60 U/L (ref 35–144)
BUN/Creatinine Ratio: 18 (calc) (ref 6–22)
BUN: 11 mg/dL (ref 7–25)
CO2: 31 mmol/L (ref 20–32)
Calcium: 8.7 mg/dL (ref 8.6–10.3)
Chloride: 101 mmol/L (ref 98–110)
Creat: 0.61 mg/dL — ABNORMAL LOW (ref 0.70–1.28)
Globulin: 2.5 g/dL (ref 1.9–3.7)
Glucose, Bld: 83 mg/dL (ref 65–99)
Potassium: 4.7 mmol/L (ref 3.5–5.3)
Sodium: 136 mmol/L (ref 135–146)
Total Bilirubin: 0.3 mg/dL (ref 0.2–1.2)
Total Protein: 6.5 g/dL (ref 6.1–8.1)
eGFR: 103 mL/min/1.73m2 (ref >=60)

## 2023-10-14 LAB — IRON,TIBC AND FERRITIN PANEL
%SAT: 54 % — ABNORMAL HIGH (ref 20–48)
Ferritin: 141 ng/mL (ref 24–380)
Iron: 137 ug/dL (ref 50–180)
TIBC: 254 ug/dL (ref 250–425)

## 2023-10-14 LAB — LIPID PANEL
Cholesterol: 151 mg/dL (ref <200)
HDL: 80 mg/dL (ref >=40)
LDL Cholesterol (Calc): 55 mg/dL
Non-HDL Cholesterol (Calc): 71 mg/dL (ref <130)
Total CHOL/HDL Ratio: 1.9 (calc) (ref <5.0)
Triglycerides: 79 mg/dL (ref <150)

## 2023-10-14 LAB — HEMOGLOBIN A1C
Hgb A1c MFr Bld: 5.7 % — ABNORMAL HIGH (ref <5.7)
Mean Plasma Glucose: 117 mg/dL
eAG (mmol/L): 6.5 mmol/L

## 2023-10-27 ENCOUNTER — Other Ambulatory Visit: Payer: Self-pay | Admitting: Emergency Medicine

## 2023-10-27 DIAGNOSIS — R911 Solitary pulmonary nodule: Secondary | ICD-10-CM

## 2023-11-15 ENCOUNTER — Other Ambulatory Visit: Payer: Self-pay | Admitting: Internal Medicine

## 2023-11-16 ENCOUNTER — Other Ambulatory Visit: Payer: Self-pay | Admitting: Internal Medicine

## 2023-11-17 NOTE — Telephone Encounter (Signed)
 Requested medications are due for refill today.  no  Requested medications are on the active medications list.  yes  Last refill. 11/17/2023  Future visit scheduled.   yes  Notes to clinic.  Refusal not delegated.    Requested Prescriptions  Pending Prescriptions Disp Refills   ALPRAZolam  (XANAX ) 0.5 MG tablet [Pharmacy Med Name: ALPRAZOLAM  0.5MG  TABLETS] 120 tablet     Sig: TAKE 1 TABLET(0.5 MG) BY MOUTH EVERY 6 HOURS AS NEEDED FOR ANXIETY     Not Delegated - Psychiatry: Anxiolytics/Hypnotics 2 Failed - 11/17/2023  3:51 PM      Failed - This refill cannot be delegated      Failed - Urine Drug Screen completed in last 360 days      Passed - Patient is not pregnant      Passed - Valid encounter within last 6 months    Recent Outpatient Visits           1 month ago Chronic heart failure with preserved ejection fraction [facility])   [place] [facility] [place], Angeline ORN, NP   7 months ago Encounter for general adult medical examination with abnormal findings   [place] [facility] [place], Angeline ORN, NP

## 2023-11-17 NOTE — Telephone Encounter (Signed)
 Requested medication (s) are due for refill today: yes  Requested medication (s) are on the active medication list: yes  Last refill:  10/13/23  Future visit scheduled: yes  Notes to clinic:  Unable to refill per protocol, cannot delegate.      Requested Prescriptions  Pending Prescriptions Disp Refills   ALPRAZolam  (XANAX ) 0.5 MG tablet [Pharmacy Med Name: ALPRAZOLAM  0.5MG  TABLETS] 120 tablet     Sig: TAKE 1 TABLET(0.5 MG) BY MOUTH EVERY 6 HOURS AS NEEDED FOR ANXIETY     Not Delegated - Psychiatry: Anxiolytics/Hypnotics 2 Failed - 11/17/2023  8:04 AM      Failed - This refill cannot be delegated      Failed - Urine Drug Screen completed in last 360 days      Passed - Patient is not pregnant      Passed - Valid encounter within last 6 months    Recent Outpatient Visits           1 month ago Chronic heart failure with preserved ejection fraction [facility])   [place] [facility] [place], Angeline ORN, NP   7 months ago Encounter for general adult medical examination with abnormal findings   [place] [facility] [place], Angeline ORN, NP

## 2023-11-20 ENCOUNTER — Other Ambulatory Visit: Payer: Self-pay | Admitting: Internal Medicine

## 2023-11-20 DIAGNOSIS — I10 Essential (primary) hypertension: Secondary | ICD-10-CM

## 2023-11-21 ENCOUNTER — Other Ambulatory Visit: Payer: Self-pay | Admitting: Internal Medicine

## 2023-11-21 DIAGNOSIS — F411 Generalized anxiety disorder: Secondary | ICD-10-CM

## 2023-11-22 NOTE — Telephone Encounter (Signed)
 Requested Prescriptions  Pending Prescriptions Disp Refills   losartan  (COZAAR ) 50 MG tablet [Pharmacy Med Name: LOSARTAN  50MG  TABLETS] 45 tablet 1    Sig: TAKE 1/2 TABLET(25 MG) BY MOUTH DAILY     Cardiovascular:  Angiotensin Receptor Blockers Failed - 11/22/2023  9:45 AM      Failed - Cr in normal range and within 180 days    Creat  Date Value Ref Range Status  10/13/2023 0.61 (L) 0.70 - 1.28 mg/dL Final   Creatinine,U  Date Value Ref Range Status  11/03/2012 46.42 mg/dL Final    Comment:       Cutoff Values for Urine Drug Screen, Pain Mgmt          Drug Class           Cutoff (ng/mL)          Amphetamines             500          Barbiturates             200          Cocaine Metabolites      150          Benzodiazepines          200          Methadone                300          Opiates                  300          Phencyclidine             25          Propoxyphene             300          Marijuana Metabolites     50    For medical purposes only.         Passed - K in normal range and within 180 days    Potassium  Date Value Ref Range Status  10/13/2023 4.7 3.5 - 5.3 mmol/L Final         Passed - Patient is not pregnant      Passed - Last BP in normal range    BP Readings from Last 1 Encounters:  10/13/23 124/72         Passed - Valid encounter within last 6 months    Recent Outpatient Visits           1 month ago Chronic heart failure with preserved ejection fraction [facility])   [place] [facility] [place], Angeline ORN, NP   7 months ago Encounter for general adult medical examination with abnormal findings   [place] [facility] [place], Angeline ORN, NP

## 2023-11-22 NOTE — Progress Notes (Signed)
    Assessment & Plan:  There are no diagnoses linked to this encounter.  Patient Instructions  We will see back in 4 months.   Please note: late entry documentation due to logistical difficulties during COVID-19 pandemic. This note is filed for information purposes only, and is not intended to be used for billing, nor does it represent the full scope/nature of the visit in question. Please see any associated scanned media linked to date of encounter for additional pertinent information.  Subjective:    HPI: Elijah Mcfarland is a 73 y.o. male presenting to the pulmonology clinic on 08/08/2019 with report of: Follow-up (pt states breathing has improved since last OV. c/o sob with exertion, wheezing and prod cough with white to yellow mucus. )     Outpatient Encounter Medications as of 08/08/2019  Medication Sig   acetaminophen  (TYLENOL ) 325 MG tablet Take 2 tablets (650 mg total) by mouth every 6 (six) hours as needed for mild pain or headache (or Fever >/= 101).   OXYGEN  Inhale 2 L into the lungs daily as needed (with activity). Wears nightly   [DISCONTINUED] albuterol  (PROVENTIL  HFA;VENTOLIN  HFA) 108 (90 Base) MCG/ACT inhaler Inhale 2 puffs into the lungs every 6 (six) hours as needed for wheezing or shortness of breath.   [DISCONTINUED] ALPRAZolam  (XANAX ) 0.5 MG tablet Take 1 tablet (0.5 mg total) by mouth 3 (three) times daily.   [DISCONTINUED] atorvastatin  (LIPITOR) 20 MG tablet TAKE 1 TABLET EVERY DAY   [DISCONTINUED] citalopram  (CELEXA ) 40 MG tablet TAKE 1 TABLET EVERY DAY   [DISCONTINUED] diphenhydrAMINE  (BENADRYL ) 25 MG tablet Take 25 mg by mouth every 6 (six) hours as needed. (Patient not taking: Reported on 08/11/2020)   [DISCONTINUED] Fluticasone -Umeclidin-Vilant (TRELEGY ELLIPTA ) 100-62.5-25 MCG/INH AEPB Inhale 1 puff into the lungs daily.   [DISCONTINUED] losartan  (COZAAR ) 50 MG tablet TAKE 1 TABLET EVERY DAY   [DISCONTINUED] ondansetron  (ZOFRAN ) 4 MG tablet Take 1 tablet (4  mg total) by mouth every 6 (six) hours as needed for nausea or vomiting. (Patient not taking: Reported on 07/21/2020)   [DISCONTINUED] phenytoin  (DILANTIN ) 100 MG ER capsule TAKE 3 CAPSULES EVERY DAY   No facility-administered encounter medications on file as of 08/08/2019.      Objective:   Vitals:   08/08/19 1041  BP: 122/70  Pulse: 78  Temp: 98 F (36.7 C)  Height: 5' 8 (1.727 m)  Weight: 130 lb (59 kg)  SpO2: 98%  TempSrc: Temporal  BMI (Calculated): 19.77     Physical exam documentation is limited by delayed entry of information.

## 2023-11-22 NOTE — Telephone Encounter (Signed)
 Requested Prescriptions  Pending Prescriptions Disp Refills   citalopram  (CELEXA ) 40 MG tablet [Pharmacy Med Name: CITALOPRAM  HYDROBROMIDE 40 MG Oral Tablet] 90 tablet 0    Sig: TAKE 1 TABLET EVERY DAY     Psychiatry:  Antidepressants - SSRI Passed - 11/22/2023  3:56 PM      Passed - Completed PHQ-2 or PHQ-9 in the last 360 days      Passed - Valid encounter within last 6 months    Recent Outpatient Visits           1 month ago Chronic heart failure with preserved ejection fraction [facility])   [facility] [place], Angeline ORN, NP   7 months ago Encounter for general adult medical examination with abnormal findings   [place] [facility] [place], Angeline ORN, NP

## 2023-11-23 ENCOUNTER — Telehealth: Payer: Self-pay

## 2023-11-23 NOTE — Telephone Encounter (Signed)
 PAP: Patient assistance application for Breztri  through [employer] (AZ&Me) has been mailed to pt's home address on file. Provider portion of application will be faxed to provider's office. For renewal 2026

## 2023-11-25 ENCOUNTER — Other Ambulatory Visit (HOSPITAL_COMMUNITY): Payer: Self-pay

## 2023-11-25 NOTE — Telephone Encounter (Signed)
 Received provider portion of PAP application for Breztri  (AZ&me)

## 2023-12-05 ENCOUNTER — Telehealth: Payer: Self-pay | Admitting: Pharmacist

## 2023-12-05 ENCOUNTER — Other Ambulatory Visit

## 2023-12-05 NOTE — Progress Notes (Signed)
   Outreach Note  12/05/2023 Name: Elijah Mcfarland MRN: [id number] DOB: 07-06-1950  Referred by: Antonette Angeline ORN, NP  Was unable to reach patient via telephone today and have left HIPAA compliant voicemail asking patient to return my call.    Follow Up Plan: Will attempt to reach patient by telephone again within the next 2 weeks  Sharyle Sia, PharmD, [facility] Clinical Pharmacist [facility] [phone]

## 2023-12-13 NOTE — Telephone Encounter (Signed)
 PAP: Application for Markus Daft has been submitted to [employer] (AZ&Me), via fax

## 2023-12-14 ENCOUNTER — Other Ambulatory Visit

## 2023-12-14 NOTE — Telephone Encounter (Signed)
 PAP: Patient assistance application for Breztri  has been approved by PAP Companies: [employer] from 02/16/2024 to 02/14/2025. Medication should be delivered to PAP Delivery: Home. For further shipping updates, please contact [employer] ([employer]) at [phone]. Patient ID is: approval letter in media

## 2023-12-15 ENCOUNTER — Other Ambulatory Visit: Payer: Self-pay | Admitting: Internal Medicine

## 2023-12-16 NOTE — Telephone Encounter (Signed)
 Requested medications are due for refill today. yes  Requested medications are on the active medications list.  yes  Last refill. 11/17/2023 #120 0 rf  Future visit scheduled.   yes  Notes to clinic.  Refill not delegated.    Requested Prescriptions  Pending Prescriptions Disp Refills   ALPRAZolam  (XANAX ) 0.5 MG tablet [Pharmacy Med Name: ALPRAZOLAM  0.5MG  TABLETS] 120 tablet     Sig: TAKE 1 TABLET(0.5 MG) BY MOUTH EVERY 6 HOURS AS NEEDED FOR ANXIETY     Not Delegated - Psychiatry: Anxiolytics/Hypnotics 2 Failed - 12/16/2023  3:46 PM      Failed - This refill cannot be delegated      Failed - Urine Drug Screen completed in last 360 days      Passed - Patient is not pregnant      Passed - Valid encounter within last 6 months    Recent Outpatient Visits           2 months ago Chronic heart failure with preserved ejection fraction [facility])   [place] [facility] [place], Angeline ORN, NP   8 months ago Encounter for general adult medical examination with abnormal findings   [place] [facility] [place], Angeline ORN, NP

## 2023-12-17 ENCOUNTER — Other Ambulatory Visit: Payer: Self-pay | Admitting: Pulmonary Disease

## 2023-12-26 ENCOUNTER — Other Ambulatory Visit: Admitting: Pharmacist

## 2023-12-26 DIAGNOSIS — J449 Chronic obstructive pulmonary disease, unspecified: Secondary | ICD-10-CM

## 2023-12-26 NOTE — Patient Instructions (Signed)
Goals Addressed             This Visit's Progress    Pharmacy Goals       Please use your Breztri 2 puffs twice a day.  Make sure you rinse your mouth well after you use it.  You can contact AZ[employer]Me at [phone] to follow up about refills of Breztri  Please consider calling the [place] Quitline for their help with quitting smoking.  The [place] Quitline phone number is: [phone]  Feel free to call me with any questions or concerns. I look forward to our next call!  Estelle Grumbles, PharmD, [facility] Clinical Pharmacist [facility] [phone]

## 2023-12-26 NOTE — Progress Notes (Signed)
 12/26/2023 Name: Elijah Mcfarland MRN: [id number] DOB: [birth year]  Chief Complaint  Patient presents with   Medication Assistance    Elijah Mcfarland is a 73 y.o. year old male who presented for a telephone visit.   They were referred to the pharmacist by their PCP for assistance in managing medication access.      Subjective:   Care Team: Primary Care Provider: Antonette Angeline ORN, NP; Next Scheduled Visit: 04/13/2024 Pulmonologist: Tamea Dedra CROME, MD; Next Scheduled Visit: 02/13/2024 Cardiologist: [facility] [place]  Medication Access/Adherence  Current Pharmacy:  [facility] [phone] - [place], [place] - [address] [place] [address] Phone: [phone] Fax: [phone]  [facility] - [place], [place] - [address] [place] [address] Phone: [phone] Fax: [phone]  [facility] - [place], [place] - [address] [place] [address] Phone: [phone] Fax: [phone]  [facility] - [place], [place] - [address] [place] [address] Phone: [phone] Fax: [phone]   Patient reports affordability concerns with their medications: No  Patient reports access/transportation concerns to their pharmacy: No  Patient reports adherence concerns with their medications:  No       COPD: Patient followed by [facility]   Current treatment: Breztri  - 2 puffs twice daily Albuterol  as needed On oxygen  as needed   Confirms using Breztri  inhaler twice daily (morning and evening) and rinsing mouth out after use   Current medication access: Enrolled in patient assistance program for Breztri  from [employer] through 02/14/2025 From review of chart, note CPhT Daria Mall has already assisted patient with re-enrollment for 2026 calendar year   Objective:  Lab Results   Component Value Date   CREATININE 0.61 (L) 10/13/2023   BUN 11 10/13/2023   NA 136 10/13/2023   K 4.7 10/13/2023   CL 101 10/13/2023   CO2 31 10/13/2023    Lab Results  Component Value Date   CHOL 151 10/13/2023   HDL 80 10/13/2023   LDLCALC 55 10/13/2023   TRIG 79 10/13/2023   CHOLHDL 1.9 10/13/2023    Medications Reviewed Today     Reviewed by Alana Sharyle LABOR, RPH-CPP (Pharmacist) on 12/26/23 at 1348  Med List Status: <None>   Medication Order Taking? Sig Documenting Provider Last Dose Status Informant  acetaminophen  (TYLENOL ) 325 MG tablet [id number]  Take 2 tablets (650 mg total) by mouth every 6 (six) hours as needed for mild pain or headache (or Fever >/= 101). Elijah Manus, MD  Active Self  albuterol  (PROVENTIL ) (2.5 MG/3ML) 0.083% nebulizer solution [id number]  Take 3 mLs (2.5 mg total) by nebulization every 6 (six) hours as needed for wheezing or shortness of breath. Tamea Dedra CROME, MD  Active   albuterol  (VENTOLIN  HFA) 108 [phone] Base) MCG/ACT inhaler [id number]  INHALE 2 PUFFS INTO THE LUNGS EVERY 6 HOURS AS NEEDED FOR WHEEZING OR SHORTNESS OF SHERIDA Tamea Dedra CROME, MD  Active   ALPRAZolam  (XANAX ) 0.5 MG tablet [id number]  TAKE 1 TABLET(0.5 MG) BY MOUTH EVERY 6 HOURS AS NEEDED FOR ANXIETY Antonette Angeline ORN, NP  Active   aspirin  EC 81 MG tablet [id number]  Take 1 tablet (81 mg total) by mouth daily. Swallow whole. Darliss Rogue, MD  Active   atorvastatin  (LIPITOR) 40 MG tablet [id number]  Take 1 tablet (40 mg  total) by mouth daily. Darliss Rogue, MD  Active     Discontinued 12/26/23 1341 (Completed Course)   budesonide -glycopyrrolate-formoterol  (BREZTRI  AEROSPHERE) 160-9-4.8 MCG/ACT AERO inhaler [id number] Yes Inhale 2 puffs into the lungs in the morning and at bedtime. Tamea Dedra CROME, MD  Active   buPROPion  (WELLBUTRIN  XL) 150 MG 24 hr tablet [id number]  Take 1 tablet (150 mg total) by mouth daily. Antonette Angeline ORN, NP  Active   cetirizine (ZYRTEC) 10 MG  tablet [id number]  Take 10 mg by mouth as needed for allergies. [provider]  Active   citalopram  (CELEXA ) 40 MG tablet [id number]  TAKE 1 TABLET EVERY DAY Baity, Angeline ORN, NP  Active   Ferrous Sulfate (IRON) 325 (65 Fe) MG TABS [id number]  Take 1 tablet by mouth in the morning and at bedtime. [provider]  Active   guaiFENesin  (MUCINEX ) 600 MG 12 hr tablet [id number]  Take 1,200 mg by mouth 2 (two) times daily. [provider]  Active   losartan  (COZAAR ) 50 MG tablet [id number]  TAKE 1/2 TABLET(25 MG) BY MOUTH DAILY Baity, Angeline ORN, NP  Active   nicotine  polacrilex (NICORETTE) 4 MG gum [id number]  Take 4 mg by mouth as needed for smoking cessation. [provider]  Active   OXYGEN  [id number]  Inhale 2 L into the lungs daily as needed (with activity). Wears nightly [provider]  Active Self  phenytoin  (DILANTIN ) 100 MG ER capsule [id number]  TAKE 3 CAPSULES EVERY DAY Antonette Angeline ORN, NP  Active     Discontinued 12/26/23 1342 (Completed Course)   tamsulosin  (FLOMAX ) 0.4 MG CAPS capsule [id number]  TAKE 1 CAPSULE(0.4 MG) BY MOUTH DAILY Antonette Angeline ORN, NP  Active   Med List Note Elijah Mcfarland, [place] 08/07/19 9182): UDS/CSA 07/06/19 repeat 10/06/2019              Assessment/Plan:   Provide patient with phone number to follow up with Cardiologist, as requested  COPD: - Patient to follow up with [employer] program as needed for Breztri  refills - Reviewed Breztri  inhaler technique   Follow Up Plan: Clinical Pharmacist will follow up with patient by telephone in October of 2026   Sharyle Sia, PharmD, [facility] [phone]

## 2024-01-03 ENCOUNTER — Ambulatory Visit: Admitting: Pulmonary Disease

## 2024-01-04 ENCOUNTER — Ambulatory Visit

## 2024-01-16 ENCOUNTER — Other Ambulatory Visit: Payer: Self-pay | Admitting: Internal Medicine

## 2024-01-18 NOTE — Telephone Encounter (Signed)
 Pt following up on med refill request from pharmacy

## 2024-01-19 ENCOUNTER — Other Ambulatory Visit: Payer: Self-pay | Admitting: Internal Medicine

## 2024-01-19 NOTE — Telephone Encounter (Signed)
 Requested medications are due for refill today.  yes  Requested medications are on the active medications list.  yes  Last refill. 12/19/2023 #120 0 rf  Future visit scheduled.   yes  Notes to clinic.  Refill not delegated.    Requested Prescriptions  Pending Prescriptions Disp Refills   ALPRAZolam  (XANAX ) 0.5 MG tablet [Pharmacy Med Name: ALPRAZOLAM  0.5MG  TABLETS] 120 tablet     Sig: TAKE 1 TABLET(0.5 MG) BY MOUTH EVERY 6 HOURS AS NEEDED FOR ANXIETY     Not Delegated - Psychiatry: Anxiolytics/Hypnotics 2 Failed - 01/19/2024 11:17 AM      Failed - This refill cannot be delegated      Failed - Urine Drug Screen completed in last 360 days      Passed - Patient is not pregnant      Passed - Valid encounter within last 6 months    Recent Outpatient Visits           3 months ago Chronic heart failure with preserved ejection fraction [facility])   [place] [facility] [place], Angeline ORN, NP   9 months ago Encounter for general adult medical examination with abnormal findings   [place] [facility] [place], Angeline ORN, NP       Future Appointments             In 4 days Agbor-Etang, Redell, MD [facility]

## 2024-01-19 NOTE — Telephone Encounter (Signed)
 Copied from CRM [id number]. Topic: Clinical - Medication Refill >> Jan 19, 2024 12:01 PM Wess RAMAN wrote: Medication: ALPRAZolam  (XANAX ) 0.5 MG tablet   Has the patient contacted their pharmacy? Yes (Agent: If no, request that the patient contact the pharmacy for the refill. If patient does not wish to contact the pharmacy document the reason why and proceed with request.) (Agent: If yes, when and what did the pharmacy advise?) They stated they sent a fax  This is the patient's preferred pharmacy:  [id number] - [place], [place] - [address] [place] [address] Phone: [phone] Fax: [phone]  Is this the correct pharmacy for this prescription? Yes If no, delete pharmacy and type the correct one.   Has the prescription been filled recently? Yes  Is the patient out of the medication? Yes  Has the patient been seen for an appointment in the last year OR does the patient have an upcoming appointment? Yes  Can we respond through MyChart? No  Agent: Please be advised that Rx refills may take up to 3 business days. We ask that you follow-up with your pharmacy.

## 2024-01-20 ENCOUNTER — Telehealth: Payer: Self-pay

## 2024-01-20 NOTE — Telephone Encounter (Signed)
 Copied from CRM #[id number]. Topic: Clinical - Prescription Issue >> Jan 20, 2024  8:41 AM Willma R wrote: Reason for CRM: Patient would like to let NP [facility] know he is no taking buPROPion  (WELLBUTRIN  XL) 150 MG 24 hr tablet. Picked the bottle up but will be returning them to the pharmacy.  Patient can be reached at [phone]

## 2024-01-20 NOTE — Telephone Encounter (Signed)
 Noted

## 2024-01-21 NOTE — Telephone Encounter (Signed)
 Requested medications are due for refill today.  no  Requested medications are on the active medications list.  yes  Last refill. 01/19/2024 #120 0 rf  Future visit scheduled.   yes  Notes to clinic.  Refill/refusal not delegated.    Requested Prescriptions  Pending Prescriptions Disp Refills   ALPRAZolam  (XANAX ) 0.5 MG tablet 120 tablet 0     Not Delegated - Psychiatry: Anxiolytics/Hypnotics 2 Failed - 01/21/2024  9:08 PM      Failed - This refill cannot be delegated      Failed - Urine Drug Screen completed in last 360 days      Passed - Patient is not pregnant      Passed - Valid encounter within last 6 months    Recent Outpatient Visits           3 months ago Chronic heart failure with preserved ejection fraction [facility])   [facility] [place], Angeline ORN, NP   9 months ago Encounter for general adult medical examination with abnormal findings   [place] [facility] [place], Angeline ORN, NP       Future Appointments             In 2 days Darliss Rogue, MD [facility] at [facility]

## 2024-01-23 ENCOUNTER — Ambulatory Visit: Admitting: Cardiology

## 2024-02-03 ENCOUNTER — Other Ambulatory Visit: Payer: Self-pay | Admitting: Internal Medicine

## 2024-02-03 DIAGNOSIS — F411 Generalized anxiety disorder: Secondary | ICD-10-CM

## 2024-02-07 NOTE — Telephone Encounter (Signed)
 Requested medication (s) are due for refill today - yes  Requested medication (s) are on the active medication list -yes  Future visit scheduled -yes  Last refill: 02/28/23 #270 3RF  Notes to clinic: non delegated Rx  Requested Prescriptions  Pending Prescriptions Disp Refills   phenytoin  (DILANTIN ) 100 MG ER capsule [Pharmacy Med Name: PHENYTOIN  SODIUM EXTENDED 100 MG Oral Capsule] 270 capsule 3    Sig: TAKE 3 CAPSULES EVERY DAY     Not Delegated - Neurology:  Anticonvulsants - phenytoin  Failed - 02/07/2024 10:57 AM      Failed - This refill cannot be delegated      Failed - HGB in normal range and within 360 days    Hemoglobin  Date Value Ref Range Status  10/13/2023 12.6 (L) 13.2 - 17.1 g/dL Final         Failed - HCT in normal range and within 360 days    HCT  Date Value Ref Range Status  10/13/2023 37.9 (L) 38.5 - 50.0 % Final         Failed - Phenytoin  (serum) in normal range and within 360 days    Phenytoin , Total  Date Value Ref Range Status  03/10/2021 10.3 10.0 - 20.0 mg/L Final   PHENYTOIN , FREE  Date Value Ref Range Status  03/15/2022 0.7 (L) 1.0 - 2.0 mg/L Final         Passed - ALT in normal range and within 360 days    ALT  Date Value Ref Range Status  10/13/2023 22 9 - 46 U/L Final         Passed - AST in normal range and within 360 days    AST  Date Value Ref Range Status  10/13/2023 20 10 - 35 U/L Final         Passed - PLT in normal range and within 360 days    Platelets  Date Value Ref Range Status  10/13/2023 220 140 - 400 Thousand/uL Final         Passed - WBC in normal range and within 360 days    WBC  Date Value Ref Range Status  10/13/2023 10.0 3.8 - 10.8 Thousand/uL Final         Passed - Completed PHQ-2 or PHQ-9 in the last 360 days      Passed - Patient is not pregnant      Passed - Valid encounter within last 12 months    Recent Outpatient Visits           3 months ago Chronic heart failure with preserved ejection  fraction [facility])   [place] [place], Angeline ORN, NP   10 months ago Encounter for general adult medical examination with abnormal findings   [place] [facility] [place], Angeline ORN, NP       Future Appointments             In 1 month Darliss, Redell, MD [facility]            Signed Prescriptions Disp Refills   citalopram  (CELEXA ) 40 MG tablet 90 tablet 0    Sig: TAKE 1 TABLET EVERY DAY     Psychiatry:  Antidepressants - SSRI Passed - 02/07/2024 10:57 AM      Passed - Completed PHQ-2 or PHQ-9 in the last 360 days      Passed - Valid encounter within last 6 months    Recent Outpatient  Visits           3 months ago Chronic heart failure with preserved ejection fraction [facility])   [place] [place], Angeline ORN, NP   10 months ago Encounter for general adult medical examination with abnormal findings   [place] [facility] [place], Angeline ORN, NP       Future Appointments             In 1 month Agbor-Etang, Redell, MD [facility]               Requested Prescriptions  Pending Prescriptions Disp Refills   phenytoin  (DILANTIN ) 100 MG ER capsule [Pharmacy Med Name: PHENYTOIN  SODIUM EXTENDED 100 MG Oral Capsule] 270 capsule 3    Sig: TAKE 3 CAPSULES EVERY DAY     Not Delegated - Neurology:  Anticonvulsants - phenytoin  Failed - 02/07/2024 10:57 AM      Failed - This refill cannot be delegated      Failed - HGB in normal range and within 360 days    Hemoglobin  Date Value Ref Range Status  10/13/2023 12.6 (L) 13.2 - 17.1 g/dL Final         Failed - HCT in normal range and within 360 days    HCT  Date Value Ref Range Status  10/13/2023 37.9 (L) 38.5 - 50.0 % Final         Failed - Phenytoin  (serum) in normal range and within 360 days    Phenytoin , Total  Date Value Ref Range Status  03/10/2021 10.3 10.0 - 20.0 mg/L Final    PHENYTOIN , FREE  Date Value Ref Range Status  03/15/2022 0.7 (L) 1.0 - 2.0 mg/L Final         Passed - ALT in normal range and within 360 days    ALT  Date Value Ref Range Status  10/13/2023 22 9 - 46 U/L Final         Passed - AST in normal range and within 360 days    AST  Date Value Ref Range Status  10/13/2023 20 10 - 35 U/L Final         Passed - PLT in normal range and within 360 days    Platelets  Date Value Ref Range Status  10/13/2023 220 140 - 400 Thousand/uL Final         Passed - WBC in normal range and within 360 days    WBC  Date Value Ref Range Status  10/13/2023 10.0 3.8 - 10.8 Thousand/uL Final         Passed - Completed PHQ-2 or PHQ-9 in the last 360 days      Passed - Patient is not pregnant      Passed - Valid encounter within last 12 months    Recent Outpatient Visits           3 months ago Chronic heart failure with preserved ejection fraction [facility])   [facility] [place], Angeline ORN, NP   10 months ago Encounter for general adult medical examination with abnormal findings   [place] [facility] [place], Angeline ORN, NP       Future Appointments             In 1 month Agbor-Etang, Redell, MD [facility]            Signed Prescriptions Disp Refills  citalopram  (CELEXA ) 40 MG tablet 90 tablet 0    Sig: TAKE 1 TABLET EVERY DAY     Psychiatry:  Antidepressants - SSRI Passed - 02/07/2024 10:57 AM      Passed - Completed PHQ-2 or PHQ-9 in the last 360 days      Passed - Valid encounter within last 6 months    Recent Outpatient Visits           3 months ago Chronic heart failure with preserved ejection fraction [facility])   [place] [place], Angeline ORN, NP   10 months ago Encounter for general adult medical examination with abnormal findings   [place] [place], Angeline ORN, NP       Future Appointments              In 1 month Agbor-Etang, Redell, MD [facility] at [facility]

## 2024-02-07 NOTE — Telephone Encounter (Signed)
 Requested Prescriptions  Pending Prescriptions Disp Refills   citalopram  (CELEXA ) 40 MG tablet [Pharmacy Med Name: CITALOPRAM  HYDROBROMIDE 40 MG Oral Tablet] 90 tablet 3    Sig: TAKE 1 TABLET EVERY DAY     Psychiatry:  Antidepressants - SSRI Passed - 02/07/2024 10:56 AM      Passed - Completed PHQ-2 or PHQ-9 in the last 360 days      Passed - Valid encounter within last 6 months    Recent Outpatient Visits           3 months ago Chronic heart failure with preserved ejection fraction [facility])   [place] [facility] [place], Angeline ORN, NP   10 months ago Encounter for general adult medical examination with abnormal findings   [place] [facility] [place], Angeline ORN, NP       Future Appointments             In 1 month Mcfarland, Redell, MD [place] [facility]             phenytoin  (DILANTIN ) 100 MG ER capsule [Pharmacy Med Name: PHENYTOIN  SODIUM EXTENDED 100 MG Oral Capsule] 270 capsule 3    Sig: TAKE 3 CAPSULES EVERY DAY     Not Delegated - Neurology:  Anticonvulsants - phenytoin  Failed - 02/07/2024 10:56 AM      Failed - This refill cannot be delegated      Failed - HGB in normal range and within 360 days    Hemoglobin  Date Value Ref Range Status  10/13/2023 12.6 (L) 13.2 - 17.1 g/dL Final         Failed - HCT in normal range and within 360 days    HCT  Date Value Ref Range Status  10/13/2023 37.9 (L) 38.5 - 50.0 % Final         Failed - Phenytoin  (serum) in normal range and within 360 days    Phenytoin , Total  Date Value Ref Range Status  03/10/2021 10.3 10.0 - 20.0 mg/L Final   PHENYTOIN , FREE  Date Value Ref Range Status  03/15/2022 0.7 (L) 1.0 - 2.0 mg/L Final         Passed - ALT in normal range and within 360 days    ALT  Date Value Ref Range Status  10/13/2023 22 9 - 46 U/L Final         Passed - AST in normal range and within 360 days    AST  Date Value Ref Range Status  10/13/2023 20 10 - 35 U/L  Final         Passed - PLT in normal range and within 360 days    Platelets  Date Value Ref Range Status  10/13/2023 220 140 - 400 Thousand/uL Final         Passed - WBC in normal range and within 360 days    WBC  Date Value Ref Range Status  10/13/2023 10.0 3.8 - 10.8 Thousand/uL Final         Passed - Completed PHQ-2 or PHQ-9 in the last 360 days      Passed - Patient is not pregnant      Passed - Valid encounter within last 12 months    Recent Outpatient Visits           3 months ago Chronic heart failure with preserved ejection fraction [facility])   [place] [facility] [place], Angeline ORN, NP  10 months ago Encounter for general adult medical examination with abnormal findings   [place] [facility] [place], Angeline ORN, NP       Future Appointments             In 1 month Mcfarland, Redell, MD [facility] at [facility]

## 2024-02-13 ENCOUNTER — Ambulatory Visit: Admitting: Pulmonary Disease

## 2024-02-13 ENCOUNTER — Encounter: Payer: Self-pay | Admitting: Pulmonary Disease

## 2024-02-13 VITALS — BP 126/80 | HR 81 | Temp 98.1°F | Ht 68.0 in | Wt 114.6 lb

## 2024-02-13 DIAGNOSIS — J449 Chronic obstructive pulmonary disease, unspecified: Secondary | ICD-10-CM | POA: Diagnosis not present

## 2024-02-13 DIAGNOSIS — F17211 Nicotine dependence, cigarettes, in remission: Secondary | ICD-10-CM | POA: Diagnosis not present

## 2024-02-13 DIAGNOSIS — R64 Cachexia: Secondary | ICD-10-CM | POA: Diagnosis not present

## 2024-02-13 DIAGNOSIS — Z23 Encounter for immunization: Secondary | ICD-10-CM

## 2024-02-13 DIAGNOSIS — R053 Chronic cough: Secondary | ICD-10-CM | POA: Diagnosis not present

## 2024-02-13 DIAGNOSIS — J9611 Chronic respiratory failure with hypoxia: Secondary | ICD-10-CM

## 2024-02-13 DIAGNOSIS — Z87891 Personal history of nicotine dependence: Secondary | ICD-10-CM

## 2024-02-13 MED ORDER — GUAIFENESIN-CODEINE 100-10 MG/5ML PO SOLN: 5.0000 mL | Freq: Four times a day (QID) | ORAL | 0 refills | Status: DC | PRN

## 2024-02-13 MED ORDER — GUAIFENESIN-CODEINE 100-10 MG/5ML PO SOLN: 5.0000 mL | Freq: Four times a day (QID) | ORAL | 0 refills | Status: AC | PRN

## 2024-02-13 NOTE — Patient Instructions (Addendum)
 VISIT SUMMARY:  You came in today for a follow-up on your severe COPD, chronic respiratory failure with hypoxia, and pulmonary cachexia. You recently quit smoking three weeks ago and are currently in your third week of cessation. You mentioned persistent shortness of breath and requested a refill of a cough medicine that previously helped you. You also inquired about receiving a flu shot and mentioned not having received the RSV shot last year.  YOUR PLAN:  -STAGE 3 SEVERE CHRONIC OBSTRUCTIVE PULMONARY DISEASE (COPD): COPD is a chronic lung disease that makes it hard to breathe. You recently quit smoking, which is a significant step towards improving your lung function. You should continue using Breztri  as prescribed. We have also started you on a new nebulizer medication to help manage your symptoms.  This medication was called Ohtuvayre .  You will be contacted by the pharmacy team.  You received a flu shot today to help protect you from respiratory infections.  -CHRONIC RESPIRATORY FAILURE WITH HYPOXIA: This condition means your lungs are not getting enough oxygen  into your blood. Quitting smoking may help improve your respiratory status over time. Continue with your current treatments and monitor your symptoms.  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 It is likely related to your severe COPD and chronic respiratory failure. Maintaining a balanced diet and following your treatment plan can help manage this condition.  -LUNG CANCER SCREENING: You are due for your lung cancer screening CT.  We will see that it gets scheduled appropriately.  INSTRUCTIONS:  Please continue using Breztri  and the new nebulizer medication as prescribed. Monitor your symptoms and follow up with us  if you notice any changes or worsening of your condition. You received a flu shot today; please consider getting the RSV shot as well. Follow up with us  in 3  months to assess your progress and make any necessary adjustments to your treatment plan.  Congratulations on quitting smoking.

## 2024-02-13 NOTE — Progress Notes (Unsigned)
 "  Subjective:    Patient ID: Elijah Mcfarland, male    DOB: 16-Jan-1951, 73 y.o.   MRN: [id number]  Patient Care Team: Antonette Angeline ORN, NP as PCP - General (Internal Medicine) Darliss Rogue, MD as PCP - Cardiology (Cardiology) Alana, Sharyle LABOR, RPH-CPP as Pharmacist Tamea Dedra CROME, MD as Consulting Physician (Pulmonary Disease) Pa, [facility])  Chief Complaint  Patient presents with   COPD    Cough, shortness of breath and wheezing. Using Breztri  daily, and albuterol  a few times everyday.     BACKGROUND/INTERVAL:73 year male, current smoker.  Past medical history significant for severe COPD, chronic respiratory failure, hypertension, aortic arthrosclerosis, GERD, arthritis, hyperlipidemia, macular degeneration, prediabetes, tobacco abuse and pulmonary cachexia.  Presents for follow-up.  This is a scheduled visit.  Last seen on 06 September 2023 by me. No recent exacerbations.  No admissions to the hospital, had follow-up CT for abnormal CT lung cancer screening on 03 April 2023 missed short-term follow-up CT.  Since last visit has quit smoking (quit date 23 January 2024).  HPI Discussed the use of AI scribe software for clinical note transcription with the patient, who gave verbal consent to proceed.  History of Present Illness   Elijah Mcfarland is a 73 year old male with severe COPD who presents for follow-up on chronic respiratory failure with hypoxia and pulmonary cachexia.  He is accompanied by his wife Elijah Mcfarland.  He recently quit smoking three weeks ago and is currently in his third week of cessation.  He experiences persistent shortness of breath, which has been a longstanding issue due to his COPD. He uses a nebulizer and has a history of trying various cough medications. He specifically requests a refill of a cough medicine that previously helped him, noting that other medications have not been effective.   He recalls being prescribed a cough medicine  containing codeine , which was effective, but he understands the limitations regarding its use due to its narcotic nature. He has been prescribed a similar non-narcotic medication in the past, which also provided relief.  He is currently taking Breztri . He expresses a preference for treatments that do not require travel to [place], as he and his wife are not comfortable driving there.  He inquires about receiving a flu shot during the visit and mentions not having received the RSV shot last year.     He is compliant with oxygen  therapy for his chronic respiratory failure.    Review of Systems A 10 point review of systems was performed and it is as noted above otherwise negative.   Patient Active Problem List   Diagnosis Date Noted   Iron deficiency anemia secondary to inadequate dietary iron intake 10/13/2023   Chronic heart failure with preserved ejection fraction (HCC) 09/20/2022   Chronic respiratory failure with hypoxia (HCC) 08/27/2022   BPH (benign prostatic hyperplasia) 09/14/2021   Aortic atherosclerosis 03/10/2021   Malnutrition 07/21/2020   GERD (gastroesophageal reflux disease) 03/01/2018   Macular degeneration 08/22/2017   HTN (hypertension) 01/07/2014   Prediabetes 04/13/2013   HYPERLIPIDEMIA, MIXED 05/17/2008   Anxiety and depression 04/25/2006   Generalized convulsive epilepsy (HCC) 04/25/2006   Stage 3 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 (HCC) 04/25/2006   Arthritis 04/25/2006    Social History   Tobacco Use   Smoking status: Former    Current packs/day: 0.00    Average packs/day: 2.9 packs/day for 54.9 years (160.0 ttl pk-yrs)    Types: Cigarettes    Start date: 27  Quit date: 01/23/2024    Years since quitting: 0.0   Smokeless tobacco: Never   Tobacco comments:    10 cigarettes daily- khj 04/21/2023    Start smoking at 73 years old    Smoked 3PPD at his heaviest.  Substance Use Topics   Alcohol use: No    Alcohol/week: 0.0 standard drinks of alcohol     Allergies[1]  Active Medications[2]  Immunization History  Administered Date(s) Administered   Fluad Quad(high Dose 65+) 11/17/2018, 02/05/2020, 03/10/2021, 03/15/2022   Fluad Trivalent(High Dose 65+) 12/21/2022   H1N1 01/05/2008   INFLUENZA, HIGH DOSE SEASONAL PF 03/03/2018   Influenza Whole 11/07/2007   Influenza, Seasonal, Injecte, Preservative Fre 04/13/2013   Influenza,inj,Quad PF,6+ Mos 11/14/2014, 02/19/2016, 02/18/2017   Moderna SARS-COV2 Booster Vaccination 12/26/2019   Moderna Sars-Covid-2 Vaccination 04/22/2019, 05/20/2019   Pneumococcal Conjugate-13 11/14/2014, 02/05/2020   Pneumococcal Polysaccharide-23 11/07/2007, 02/19/2016, 03/03/2018   Td 09/06/2008        Objective:     Vitals:   02/13/24 1339  BP: 126/80  Pulse: 81  Temp: 98.1 F (36.7 C)  Height: 5' 8 (1.727 m)  Weight: 114 lb 9.6 oz (52 kg)  SpO2: 96% Comment: 2L  TempSrc: Temporal  BMI (Calculated): 17.43     SpO2: 96 % (2L)  GENERAL: Thin, well-developed male, no overt respiratory distress, fully ambulatory.  Comfortable with nasal cannula O2 via POC. HEAD: Normocephalic, atraumatic. EYES: Wears sunglasses (due to macular degeneration). MOUTH: Dentures both, oral mucosa moist. NECK: Supple. No thyromegaly. No nodules. No JVD.  Trachea midline PULMONARY: Symmetrical air entry, no accessory muscle use noted today, rare wheezes noted, no other adventitious sounds, breath sounds are coarse.  Positive Hoover's sign. CARDIOVASCULAR: S1 and S2. Regular rate and rhythm.  No overt murmurs, gallops or rubs noted. GASTROINTESTINAL: No distention.  Benign. MUSCULOSKELETAL: No joint deformity, no clubbing, no edema. NEUROLOGIC: No focal deficits, fluent speech, awake, alert. SKIN: Intact,warm,dry.  No rashes noted on limited exam. PSYCH: Normal mood and behavior   Assessment & Plan:     ICD-10-CM   1. Stage 3 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 (HCC)  J44.9     2. Chronic respiratory failure  with hypoxia (HCC)  J96.11     3. 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 Chronic cough  R05.3       Meds ordered this encounter  Medications   DISCONTD: guaiFENesin -codeine  100-10 MG/5ML syrup    Sig: Take 5 mLs by mouth every 6 (six) hours as needed for cough.    Dispense:  120 mL    Refill:  0   guaiFENesin -codeine  100-10 MG/5ML syrup    Sig: Take 5 mLs by mouth every 6 (six) hours as needed for cough.    Dispense:  120 mL    Refill:  0   Discussion:    Stage 3 severe chronic obstructive pulmonary disease (COPD) Severe COPD with recent smoking cessation three weeks ago. Improvement in airway function expected in approximately two months. Current treatment includes Breztri .  Discussed options for treatment to include Dupixent for COPD indication (meets criteria by eosinophil count) or initiating Ohtuvayre , nebulizer based.  Patient prefers Ohtuvayre  due to not wanting to travel to [facility] for initiation of Dupixent education. - Continue Breztri  2 puffs twice a day - Initiated paperwork for Ohtuvayre  - Administered flu shot  Chronic respiratory failure with hypoxia Chronic respiratory failure with hypoxia. Recent smoking cessation may improve respiratory status  over time.  Chronic cough - Renewed guaifenesin  codeine  but advised cannot receive any more after this  Pulmonary cachexia Likely related to severe COPD and chronic respiratory failure.     Health maintenance Received flu vaccine  Will see the patient in follow-up in 3 months time.   Advised if symptoms do not improve or worsen, to please contact office for sooner follow up or seek emergency care.    I spent 40 minutes of dedicated to the care of this patient on the date of this encounter to include pre-visit review of records, face-to-face time with the patient discussing conditions above, post visit ordering of testing, clinical documentation with the electronic health record, making  appropriate referrals as documented, and communicating necessary findings to members of the patients care team.     C. Leita Sanders, MD Advanced Bronchoscopy PCCM [facility]    *This note was generated using voice recognition software/Dragon and/or AI transcription program.  Despite best efforts to proofread, errors can occur which can change the meaning. Any transcriptional errors that result from this process are unintentional and may not be fully corrected at the time of dictation.     [1]  Allergies Allergen Reactions   Nsaids Other (See Comments)    GI bleed and vomiting  [2]  Current Meds  Medication Sig   acetaminophen  (TYLENOL ) 325 MG tablet Take 2 tablets (650 mg total) by mouth every 6 (six) hours as needed for mild pain or headache (or Fever >/= 101).   albuterol  (PROVENTIL ) (2.5 MG/3ML) 0.083% nebulizer solution Take 3 mLs (2.5 mg total) by nebulization every 6 (six) hours as needed for wheezing or shortness of breath.   albuterol  (VENTOLIN  HFA) 108 (90 Base) MCG/ACT inhaler INHALE 2 PUFFS INTO THE LUNGS EVERY 6 HOURS AS NEEDED FOR WHEEZING OR SHORTNESS OF BREATH   ALPRAZolam  (XANAX ) 0.5 MG tablet TAKE 1 TABLET(0.5 MG) BY MOUTH EVERY 6 HOURS AS NEEDED FOR ANXIETY   aspirin  EC 81 MG tablet Take 1 tablet (81 mg total) by mouth daily. Swallow whole.   atorvastatin  (LIPITOR) 40 MG tablet Take 1 tablet (40 mg total) by mouth daily.   budesonide -glycopyrrolate-formoterol  (BREZTRI  AEROSPHERE) 160-9-4.8 MCG/ACT AERO inhaler Inhale 2 puffs into the lungs in the morning and at bedtime.   cetirizine (ZYRTEC) 10 MG tablet Take 10 mg by mouth as needed for allergies.   citalopram  (CELEXA ) 40 MG tablet TAKE 1 TABLET EVERY DAY   Ferrous Sulfate (IRON) 325 (65 Fe) MG TABS Take 1 tablet by mouth in the morning and at bedtime.   guaiFENesin  (MUCINEX ) 600 MG 12 hr tablet Take 1,200 mg by mouth 2 (two) times daily. (Patient taking differently: Take 1,200 mg by mouth 2  (two) times daily. PRN)   guaiFENesin -codeine  100-10 MG/5ML syrup Take 5 mLs by mouth every 6 (six) hours as needed for cough.   losartan  (COZAAR ) 50 MG tablet TAKE 1/2 TABLET(25 MG) BY MOUTH DAILY   nicotine  polacrilex (NICORETTE) 4 MG gum Take 4 mg by mouth as needed for smoking cessation.   OXYGEN  Inhale 2 L into the lungs daily as needed (with activity). Wears nightly   phenytoin  (DILANTIN ) 100 MG ER capsule TAKE 3 CAPSULES EVERY DAY   tamsulosin  (FLOMAX ) 0.4 MG CAPS capsule TAKE 1 CAPSULE(0.4 MG) BY MOUTH DAILY   [DISCONTINUED] guaiFENesin -codeine  100-10 MG/5ML syrup Take 5 mLs by mouth every 6 (six) hours as needed for cough.   "

## 2024-02-15 ENCOUNTER — Ambulatory Visit (HOSPITAL_COMMUNITY)
Admission: RE | Admit: 2024-02-15 | Discharge: 2024-02-15 | Disposition: A | Discharge status: Home / Self Care | Source: Ambulatory Visit | Attending: Acute Care | Admitting: Acute Care

## 2024-02-15 ENCOUNTER — Other Ambulatory Visit: Payer: Self-pay | Admitting: Internal Medicine

## 2024-02-15 DIAGNOSIS — R911 Solitary pulmonary nodule: Secondary | ICD-10-CM | POA: Insufficient documentation

## 2024-02-15 NOTE — Telephone Encounter (Signed)
 Requested medication (s) are due for refill today: Yes  Requested medication (s) are on the active medication list: Yes  Last refill:  01/19/24  Future visit scheduled: Yes  Notes to clinic:  Unable to refill per protocol, cannot delegate.      Requested Prescriptions  Pending Prescriptions Disp Refills   ALPRAZolam  (XANAX ) 0.5 MG tablet [Pharmacy Med Name: ALPRAZOLAM  0.5MG  TABLETS] 120 tablet     Sig: TAKE 1 TABLET(0.5 MG) BY MOUTH EVERY 6 HOURS AS NEEDED FOR ANXIETY     Not Delegated - Psychiatry: Anxiolytics/Hypnotics 2 Failed - 02/15/2024  5:42 PM      Failed - This refill cannot be delegated      Failed - Urine Drug Screen completed in last 360 days      Passed - Patient is not pregnant      Passed - Valid encounter within last 6 months    Recent Outpatient Visits           4 months ago Chronic heart failure with preserved ejection fraction [facility])   [place] [facility] [place], Angeline ORN, NP   10 months ago Encounter for general adult medical examination with abnormal findings   [place] [facility] [place], Angeline ORN, NP       Future Appointments             In 3 weeks Agbor-Etang, Redell, MD [facility] at [facility]

## 2024-02-18 ENCOUNTER — Other Ambulatory Visit: Payer: Self-pay | Admitting: Internal Medicine

## 2024-02-20 NOTE — Telephone Encounter (Signed)
 Requested by interface surescripts. Future visit 04/13/24. Requested Prescriptions  Pending Prescriptions Disp Refills   tamsulosin  (FLOMAX ) 0.4 MG CAPS capsule [Pharmacy Med Name: TAMSULOSIN  0.4MG  CAPSULES] 90 capsule 1    Sig: TAKE 1 CAPSULE(0.4 MG) BY MOUTH DAILY     Urology: Alpha-Adrenergic Blocker Passed - 02/20/2024  1:55 PM      Passed - PSA in normal range and within 360 days    PSA  Date Value Ref Range Status  04/11/2023 0.96 < OR = 4.00 ng/mL Final    Comment:    The total PSA value from this assay system is  standardized against the WHO standard. The test  result will be approximately 20% lower when compared  to the equimolar-standardized total PSA (Beckman  Coulter). Comparison of serial PSA results should be  interpreted with this fact in mind. . This test was performed using the Siemens  chemiluminescent method. Values obtained from  different assay methods cannot be used interchangeably. PSA levels, regardless of value, should not be interpreted as absolute evidence of the presence or absence of disease.   02/05/2020 0.92 0.10 - 4.00 ng/ml Final    Comment:    Test performed using Access Hybritech PSA Assay, a parmagnetic partical, chemiluminecent immunoassay.         Passed - Last BP in normal range    BP Readings from Last 1 Encounters:  02/13/24 126/80         Passed - Valid encounter within last 12 months    Recent Outpatient Visits           4 months ago Chronic heart failure with preserved ejection fraction [facility])   [facility] [place], Angeline ORN, NP   10 months ago Encounter for general adult medical examination with abnormal findings   [place] [facility] [place], Angeline ORN, NP       Future Appointments             In 3 weeks Agbor-Etang, Redell, MD [facility] at [facility]

## 2024-02-21 ENCOUNTER — Other Ambulatory Visit (HOSPITAL_COMMUNITY): Payer: Self-pay

## 2024-02-21 ENCOUNTER — Telehealth: Payer: Self-pay

## 2024-02-21 NOTE — Telephone Encounter (Signed)
 Received Ohtuvayre  new start paperwork. Completed form and faxed with clinicals and insurance card copy to [facility]   Phone#: [phone] Fax#: [phone]

## 2024-02-22 NOTE — Telephone Encounter (Signed)
 Received fax from [employer] with summary of benefits. Referral form for Ohtuvayre  received. Rx will be triaged to [facility] (phone: [phone]). Once benefits investigation completed, pharmacy will reach out the patient to schedule shipment. If medication is unaffordable, patient will need to express financial hardship to be referred back to [place] [facility] for patient assistance program pre-screening.   Patient ID: [id number] Pharmacy phone: [facility] (phone: [phone]) [place] [facility] Phone#: [phone]

## 2024-02-22 NOTE — Telephone Encounter (Signed)
 Received fax from [employer] confirming receipt of enrollment form  Patient ID: [id number]

## 2024-02-24 ENCOUNTER — Ambulatory Visit: Payer: Self-pay

## 2024-02-24 NOTE — Telephone Encounter (Signed)
 FYI Only or Action Required?: FYI only for provider: Non-critical Chest CT Results.  Patient was last seen in primary care on 10/13/2023 by Antonette Angeline ORN, NP.  Called Nurse Triage reporting Results.  Triage Disposition: Call PCP Within 24 Hours  Patient/caregiver understands and will follow disposition?: Yes    Spoke with Gordy at [facility]. Reporting non-critical Chest CT results today:  IMPRESSION: 1. Lung-RADS 4B, suspicious. Additional imaging evaluation or consultation with Pulmonology or Thoracic Surgery recommended. Two new irregular solid right lower lobe pulmonary nodules measuring 9.2 mm and 8.8 mm. 2. Dilated main pulmonary artery, suggesting pulmonary arterial hypertension. 3. Two-vessel coronary atherosclerosis. 4. Aortic Atherosclerosis (ICD10-I70.0) and Emphysema (ICD10-J43.9).      Copied from CRM #[id number]. Topic: Clinical - Red Word Triage >> Feb 24, 2024  4:47 PM Devaughn RAMAN wrote: Red Word that prompted transfer to Nurse Triage: Reason for CRM: Gordy with [facility] needs to provide call report to the nurse, contacted NT Line spoke with and transferred pt Reason for Disposition  Lab or radiology calling with test results  Answer Assessment - Initial Assessment Questions 1. REASON FOR CALL or QUESTION: What is your reason for calling today? or How can I best     Chest CT results, non critical  2. CALLER: Document the source of call. (e.g., laboratory staff, caregiver or patient).     Gordy at [facility]  Protocols used: PCP Call - No Triage-A-AH

## 2024-02-27 ENCOUNTER — Telehealth: Payer: Self-pay

## 2024-02-27 ENCOUNTER — Other Ambulatory Visit: Payer: Self-pay | Admitting: Acute Care

## 2024-02-27 ENCOUNTER — Telehealth: Payer: Self-pay | Admitting: Acute Care

## 2024-02-27 DIAGNOSIS — R911 Solitary pulmonary nodule: Secondary | ICD-10-CM

## 2024-02-27 NOTE — Telephone Encounter (Signed)
 IMPRESSION: 1. Lung-RADS 4B, suspicious. Additional imaging evaluation or consultation with Pulmonology or Thoracic Surgery recommended. Two new irregular solid right lower lobe pulmonary nodules measuring 9.2 mm and 8.8 mm. 2. Dilated main pulmonary artery, suggesting pulmonary arterial hypertension. 3. Two-vessel coronary atherosclerosis. 4. Aortic Atherosclerosis (ICD10-I70.0) and Emphysema (ICD10-J43.9).

## 2024-02-27 NOTE — Telephone Encounter (Signed)
 Results/ plans faxed to PCP. Will follow for PET results.

## 2024-02-27 NOTE — Telephone Encounter (Signed)
 I have called the patient with the results of her low dose CT Chest. His scan was read as a LR 4 B. He has Two new irregular solid right lower lobe pulmonary nodules measuring 9.2 mm and 8.8 mm . His last 3 scans have been abnormal ( 4B/ LR 3/LR 4 B. ) I reached out to Dr. Tamea, who follows this patient. We discussed a PET scan to further evaluate this finding. She is in agreement with this plan. Patient verbalized understanding of the above and is in agreement with a PET scan and follow up with Dr. KANDICE. In the [place] office.   There was also notation of a dilated Pulmonary artery suggestive of PAH. Last echo done in 2023 showed Grade II Diastolic dysfunction, mild to moderate mitral valve regurgitation and no pulmonic valve regurgitation. He was last seen by cardiology 03/2023.   Please fax results to PCP and let her know plan for follow up. Thanks so much

## 2024-02-27 NOTE — Progress Notes (Signed)
 See telephone note dated 02/27/2024.

## 2024-02-27 NOTE — Telephone Encounter (Signed)
 Ordered by Lauraine Lites, NP, follows with pulmonology.  Will let them follow-up with patient.

## 2024-02-27 NOTE — Telephone Encounter (Signed)
 See other telephone note from 02/27/24.

## 2024-02-29 ENCOUNTER — Other Ambulatory Visit (HOSPITAL_COMMUNITY): Payer: Self-pay

## 2024-02-29 NOTE — Telephone Encounter (Signed)
 Received fax from [facility] stating Ohtuvayre  is not covered by the pt's insurance. They will refer him to [employer] to screen for patient assistance.

## 2024-03-09 ENCOUNTER — Ambulatory Visit (HOSPITAL_COMMUNITY)

## 2024-03-13 ENCOUNTER — Ambulatory Visit: Admitting: Cardiology

## 2024-03-15 ENCOUNTER — Ambulatory Visit (HOSPITAL_COMMUNITY): Admission: RE | Admit: 2024-03-15 | Source: Ambulatory Visit

## 2024-03-16 ENCOUNTER — Other Ambulatory Visit: Payer: Self-pay | Admitting: Internal Medicine

## 2024-03-19 NOTE — Telephone Encounter (Signed)
 Requested medication (s) are due for refill today: yes  Requested medication (s) are on the active medication list: yes  Last refill:  02/17/24  Future visit scheduled: yes  Notes to clinic:  Unable to refill per protocol, cannot delegate.      Requested Prescriptions  Pending Prescriptions Disp Refills   ALPRAZolam  (XANAX ) 0.5 MG tablet [Pharmacy Med Name: ALPRAZOLAM  0.5MG  TABLETS] 120 tablet     Sig: TAKE 1 TABLET(0.5 MG) BY MOUTH EVERY 6 HOURS AS NEEDED FOR ANXIETY     Not Delegated - Psychiatry: Anxiolytics/Hypnotics 2 Failed - 03/19/2024  8:49 AM      Failed - This refill cannot be delegated      Failed - Urine Drug Screen completed in last 360 days      Passed - Patient is not pregnant      Passed - Valid encounter within last 6 months    Recent Outpatient Visits           5 months ago Chronic heart failure with preserved ejection fraction [facility])   [place] [facility] [place], Angeline ORN, NP   11 months ago Encounter for general adult medical examination with abnormal findings   [place] [facility] [place], Angeline ORN, NP       Future Appointments             In 4 days Agbor-Etang, Redell, MD [facility] at [facility]

## 2024-03-21 ENCOUNTER — Ambulatory Visit

## 2024-03-23 ENCOUNTER — Ambulatory Visit: Admitting: Cardiology

## 2024-03-27 ENCOUNTER — Ambulatory Visit: Admitting: Pulmonary Disease

## 2024-04-02 ENCOUNTER — Other Ambulatory Visit (HOSPITAL_COMMUNITY)

## 2024-04-05 ENCOUNTER — Ambulatory Visit (HOSPITAL_COMMUNITY)

## 2024-04-13 ENCOUNTER — Encounter: Admitting: Internal Medicine

## 2024-05-25 ENCOUNTER — Ambulatory Visit: Admitting: Cardiology

## 2024-12-03 ENCOUNTER — Other Ambulatory Visit
